# Patient Record
Sex: Male | Born: 1940
Health system: Southern US, Community
[De-identification: ages and names within clinical notes are randomized; demographics above are authoritative.]

## PROBLEM LIST (undated history)

## (undated) ENCOUNTER — Emergency Department (HOSPITAL_COMMUNITY): Payer: Medicare Other

## (undated) DIAGNOSIS — F329 Major depressive disorder, single episode, unspecified: Secondary | ICD-10-CM

## (undated) DIAGNOSIS — F32A Depression, unspecified: Secondary | ICD-10-CM

## (undated) DIAGNOSIS — F40240 Claustrophobia: Secondary | ICD-10-CM

## (undated) DIAGNOSIS — Z950 Presence of cardiac pacemaker: Secondary | ICD-10-CM

## (undated) DIAGNOSIS — F419 Anxiety disorder, unspecified: Secondary | ICD-10-CM

## (undated) DIAGNOSIS — I951 Orthostatic hypotension: Secondary | ICD-10-CM

## (undated) DIAGNOSIS — I714 Abdominal aortic aneurysm, without rupture, unspecified: Secondary | ICD-10-CM

## (undated) DIAGNOSIS — M545 Low back pain, unspecified: Secondary | ICD-10-CM

## (undated) DIAGNOSIS — Z92241 Personal history of systemic steroid therapy: Secondary | ICD-10-CM

## (undated) DIAGNOSIS — E7800 Pure hypercholesterolemia, unspecified: Secondary | ICD-10-CM

## (undated) DIAGNOSIS — R011 Cardiac murmur, unspecified: Secondary | ICD-10-CM

## (undated) DIAGNOSIS — F0390 Unspecified dementia without behavioral disturbance: Secondary | ICD-10-CM

## (undated) DIAGNOSIS — K5900 Constipation, unspecified: Secondary | ICD-10-CM

## (undated) DIAGNOSIS — M1990 Unspecified osteoarthritis, unspecified site: Secondary | ICD-10-CM

## (undated) DIAGNOSIS — D699 Hemorrhagic condition, unspecified: Secondary | ICD-10-CM

## (undated) DIAGNOSIS — Z9289 Personal history of other medical treatment: Secondary | ICD-10-CM

## (undated) DIAGNOSIS — G8929 Other chronic pain: Secondary | ICD-10-CM

## (undated) DIAGNOSIS — I471 Supraventricular tachycardia: Secondary | ICD-10-CM

## (undated) DIAGNOSIS — K5090 Crohn's disease, unspecified, without complications: Secondary | ICD-10-CM

## (undated) DIAGNOSIS — F4310 Post-traumatic stress disorder, unspecified: Secondary | ICD-10-CM

## (undated) DIAGNOSIS — N400 Enlarged prostate without lower urinary tract symptoms: Secondary | ICD-10-CM

## (undated) DIAGNOSIS — I480 Paroxysmal atrial fibrillation: Secondary | ICD-10-CM

## (undated) DIAGNOSIS — I739 Peripheral vascular disease, unspecified: Secondary | ICD-10-CM

## (undated) DIAGNOSIS — R001 Bradycardia, unspecified: Secondary | ICD-10-CM

## (undated) DIAGNOSIS — I4719 Other supraventricular tachycardia: Secondary | ICD-10-CM

## (undated) DIAGNOSIS — N3281 Overactive bladder: Secondary | ICD-10-CM

## (undated) DIAGNOSIS — I10 Essential (primary) hypertension: Secondary | ICD-10-CM

## (undated) DIAGNOSIS — I2510 Atherosclerotic heart disease of native coronary artery without angina pectoris: Secondary | ICD-10-CM

## (undated) DIAGNOSIS — K219 Gastro-esophageal reflux disease without esophagitis: Secondary | ICD-10-CM

## (undated) DIAGNOSIS — N189 Chronic kidney disease, unspecified: Secondary | ICD-10-CM

## (undated) DIAGNOSIS — G4730 Sleep apnea, unspecified: Secondary | ICD-10-CM

## (undated) DIAGNOSIS — G629 Polyneuropathy, unspecified: Secondary | ICD-10-CM

## (undated) HISTORY — DX: Other supraventricular tachycardia: I47.19

## (undated) HISTORY — DX: Paroxysmal atrial fibrillation: I48.0

## (undated) HISTORY — DX: Abdominal aortic aneurysm, without rupture, unspecified: I71.40

## (undated) HISTORY — DX: Bradycardia, unspecified: R00.1

## (undated) HISTORY — DX: Orthostatic hypotension: I95.1

## (undated) HISTORY — PX: MIDDLE EAR SURGERY: SHX713

## (undated) HISTORY — DX: Atherosclerotic heart disease of native coronary artery without angina pectoris: I25.10

## (undated) HISTORY — DX: Abdominal aortic aneurysm, without rupture: I71.4

## (undated) HISTORY — DX: Supraventricular tachycardia: I47.1

## (undated) HISTORY — PX: INSERT / REPLACE / REMOVE PACEMAKER: SUR710

## (undated) HISTORY — DX: Peripheral vascular disease, unspecified: I73.9

## (undated) HISTORY — PX: SHOULDER ARTHROSCOPY: SHX128

## (undated) HISTORY — PX: TONSILLECTOMY: SUR1361

## (undated) HISTORY — PX: KNEE ARTHROSCOPY: SHX127

---

## 1965-02-04 DIAGNOSIS — Z9289 Personal history of other medical treatment: Secondary | ICD-10-CM

## 1965-02-04 HISTORY — PX: HIP SURGERY: SHX245

## 1965-02-04 HISTORY — DX: Personal history of other medical treatment: Z92.89

## 1997-02-04 HISTORY — PX: RENAL ARTERY STENT: SHX2321

## 1997-02-09 HISTORY — PX: CORONARY ANGIOPLASTY WITH STENT PLACEMENT: SHX49

## 1997-09-20 ENCOUNTER — Observation Stay (HOSPITAL_COMMUNITY): Admission: RE | Admit: 1997-09-20 | Discharge: 1997-09-21 | Payer: Self-pay | Admitting: Cardiovascular Disease

## 1999-04-23 ENCOUNTER — Encounter: Admission: RE | Admit: 1999-04-23 | Discharge: 1999-04-23 | Payer: Self-pay | Admitting: Gastroenterology

## 1999-04-23 ENCOUNTER — Encounter: Payer: Self-pay | Admitting: Gastroenterology

## 2000-08-01 ENCOUNTER — Encounter: Payer: Self-pay | Admitting: Cardiovascular Disease

## 2000-08-01 ENCOUNTER — Ambulatory Visit (HOSPITAL_COMMUNITY): Admission: RE | Admit: 2000-08-01 | Discharge: 2000-08-01 | Payer: Self-pay | Admitting: Cardiovascular Disease

## 2001-02-20 ENCOUNTER — Encounter: Payer: Self-pay | Admitting: Gastroenterology

## 2001-02-20 ENCOUNTER — Encounter: Admission: RE | Admit: 2001-02-20 | Discharge: 2001-02-20 | Payer: Self-pay | Admitting: Gastroenterology

## 2001-04-29 ENCOUNTER — Encounter (INDEPENDENT_AMBULATORY_CARE_PROVIDER_SITE_OTHER): Payer: Self-pay | Admitting: Specialist

## 2001-04-29 ENCOUNTER — Ambulatory Visit (HOSPITAL_COMMUNITY): Admission: RE | Admit: 2001-04-29 | Discharge: 2001-04-29 | Payer: Self-pay | Admitting: Gastroenterology

## 2001-11-04 ENCOUNTER — Encounter: Payer: Self-pay | Admitting: Orthopaedic Surgery

## 2001-11-04 ENCOUNTER — Encounter: Admission: RE | Admit: 2001-11-04 | Discharge: 2001-11-04 | Payer: Self-pay | Admitting: Orthopaedic Surgery

## 2003-06-14 ENCOUNTER — Ambulatory Visit (HOSPITAL_COMMUNITY): Admission: RE | Admit: 2003-06-14 | Discharge: 2003-06-14 | Payer: Self-pay | Admitting: Orthopaedic Surgery

## 2003-08-29 ENCOUNTER — Inpatient Hospital Stay (HOSPITAL_COMMUNITY): Admission: RE | Admit: 2003-08-29 | Discharge: 2003-08-30 | Payer: Self-pay | Admitting: Neurosurgery

## 2004-01-10 ENCOUNTER — Encounter: Admission: RE | Admit: 2004-01-10 | Discharge: 2004-01-10 | Payer: Self-pay | Admitting: Gastroenterology

## 2005-01-31 ENCOUNTER — Encounter: Admission: RE | Admit: 2005-01-31 | Discharge: 2005-01-31 | Payer: Self-pay | Admitting: Gastroenterology

## 2005-10-10 ENCOUNTER — Ambulatory Visit (HOSPITAL_COMMUNITY): Admission: RE | Admit: 2005-10-10 | Discharge: 2005-10-11 | Payer: Self-pay | Admitting: Cardiovascular Disease

## 2006-05-18 ENCOUNTER — Inpatient Hospital Stay (HOSPITAL_COMMUNITY): Admission: EM | Admit: 2006-05-18 | Discharge: 2006-05-20 | Payer: Self-pay | Admitting: Emergency Medicine

## 2007-02-12 ENCOUNTER — Ambulatory Visit (HOSPITAL_COMMUNITY): Admission: RE | Admit: 2007-02-12 | Discharge: 2007-02-12 | Payer: Self-pay | Admitting: Cardiovascular Disease

## 2007-11-20 ENCOUNTER — Ambulatory Visit (HOSPITAL_COMMUNITY): Admission: RE | Admit: 2007-11-20 | Discharge: 2007-11-20 | Payer: Self-pay | Admitting: Orthopedic Surgery

## 2008-08-19 ENCOUNTER — Encounter: Admission: RE | Admit: 2008-08-19 | Discharge: 2008-08-19 | Payer: Self-pay | Admitting: Neurosurgery

## 2010-05-03 ENCOUNTER — Other Ambulatory Visit: Payer: Self-pay | Admitting: Gastroenterology

## 2010-06-19 NOTE — Op Note (Signed)
NAME:  Kevin Wiley, Kevin Wiley NO.:  [id number]   MEDICAL RECORD NO.:  [id number]          PATIENT TYPE:  AMB   LOCATION:  DAY                          FACILITY:  [facility]   PHYSICIAN:  Kipp Brood. Gioffre, M.D.DATE OF BIRTH:  07-Oct-1940   DATE OF PROCEDURE:  11/20/2007  DATE OF DISCHARGE:                               OPERATIVE REPORT   SURGEON:  Kipp Brood. Gladstone Lighter, M.D.   ASSISTANT:  Nurse.   PREOPERATIVE DIAGNOSIS:  Rule out the possibility of a torn medial and  lateral meniscus, left knee.   Note, the patient and I discussed the possibilities of a MRI  preoperatively.  He preferred not to do that on the basis that it is the  same problem he had in his other knee when we arthroscoped it before.   POSTOPERATIVE DIAGNOSIS:  1. 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  2. He had a chronic synovitis suprapatellar pouch.   OPERATION:  1. Diagnostic arthroscopy, right knee.  2. Synovectomy suprapatellar pouch, right knee.  3. Abrasion chondroplasty medial femoral condyle, right knee.   PROCEDURE:  Under general anesthesia, routine orthopedic prepping and  draping of the right lower extremity was carried out.  He had 1 gram of  IV Ancef.  Small punctate incision was made in the suprapatellar pouch.  Inflow cannula was inserted.  Knee was distended with saline.  Another  small punctate incision was made in the anterolateral joint.  The  arthroscope was entered from the lateral approach.  I probed the medial  and lateral menisci.  There were definitely intact.  There was  absolutely no tears.  Cruciates were intact.  Suprapatellar pouch  introduced the ArthroCare.  The ArthroCare was entered from the medial  approach, and I did an abrasion chondroplasty because of a large flap of  articular cartilage and medial femoral condyle that was literally  hanging off the condyle  like an orange peel.  Once that was completed,  we further investigated the area, and there was no other abnormalities.  Lateral joint was reinvestigated again.  I probed the lateral meniscus  as well as the medial, and there was no loose or torn pieces of  meniscus.  I went up in the suprapatellar pouch.  He had a chronic  synovitis.  I introduced the ArthroCare and did a synovectomy.  Thoroughly irrigated out the knee, removed all the fluid, injected 30 mL  0.25% Marcaine with epinephrine in the joint.  All three punctate  incisions were closed with 3-0 nylon suture, and a sterile Neosporin  bundle dressing was applied.  Following that, the patient left the  operative room satisfactory position.   Postoperatively, he will be on:  1. Percocet 10/650 one every 4 hours p.r.n. for pain.  2. He will be on aspirin 325 mg b.i.d. as an anticoagulant today and      for 2 weeks.  3. He will be on crutches partial  to full weightbearing as tolerated.  4. I will see him in the office in 12-14 days for follow-up.           ______________________________  Kipp Brood. Gladstone Lighter, M.D.     RAG/MEDQ  D:  11/20/2007  T:  11/20/2007  Job:  [id number]   cc:   Jori Moll A. Gladstone Lighter, M.D.  Fax: [phone]

## 2010-06-22 NOTE — Op Note (Signed)
NAME:  LAVEL, RIEMAN                           ACCOUNT NO.:  [id number]   MEDICAL RECORD NO.:  [id number]                   PATIENT TYPE:  INP   LOCATION:  2899                                 FACILITY:  [facility]   PHYSICIAN:  Hosie Spangle, M.D.            DATE OF BIRTH:  1940-11-24   DATE OF PROCEDURE:  08/29/2003  DATE OF DISCHARGE:                                 OPERATIVE REPORT   PREOPERATIVE DIAGNOSES:  Cervical spondylosis with cervical degenerative  disk disease, cervical instability, and radiculopathy.   POSTOPERATIVE DIAGNOSES:  Cervical spondylosis with cervical degenerative  disk disease, cervical instability, and radiculopathy.   PROCEDURE:  C4-5, C5-6, and C6-7 anterior cervical diskectomy and  arthrodesis with iliac crest allograft and tethered cervical plating.   SURGEON:  Hosie Spangle, M.D.   ASSISTANT:  Ashok Pall, M.D.   ANESTHESIA:  General endotracheal.   INDICATIONS:  The patient is a 70 year old man who presented with neck and  bilateral upper extremity pain who was found to have significant foraminal  encroachment from spurring bilaterally at C5-6 and C6-7, and to have  hypermobility and instability to the C4-5 level.  A decision was therefore  made to proceed with a three-level anterior cervical diskectomy and  arthrodesis.   PROCEDURE:  The patient was brought to the operating room and placed under  general endotracheal anesthesia.  The patient was placed in 10 pounds of  Holter traction.  The neck was prepped with Betadine soap and solution, and  draped in a sterile fashion.  An oblique incision was made in the left side  of the neck paralleling the anterior border of the left sternocleidal  muscle.  Dissection was carried down to the subcutaneous tissue.  Bipolar  cautery and electrocautery was used to maintain hemostasis.  The skin had  been infiltrated with local anesthetic prior to skin incision.  Dissection  was then carried down  through an avascular plane into the  sternocleidomastoid, carotid and jugular veins out laterally, and the  trachea and esophagus medially.  The ventral aspect of the vertebral column  was identified and __________ taken and the C4-5, C5-6, and C6-7  intervertebral disk space was identified.  Diskectomy was begun at each  level with incision of the annulus and denuded with microcurettes and  pituitary rongeurs.  Anterior osteophytic overgrowth at each level was  removed using an osteophyte removal tool as well as a Land.  The  microscope was draped and brought into the field to provide additional  magnification, illumination, and visualization, and the remainder of the  decompression was performed using microdissection and microsurgical  technique.  The cartilaginous endplates of the corresponding vertebrae were  removed using the microcurettes along with the __________ Max drill.  Then,  posterior osteophytic overgrowth was removed using the __________ Max drill  along with a 2 mm Kerrison punch with a thin footplate.  Particular  attention was paid to the neural foramina bilaterally at C5-6 and C6-7 to  insure good decompression.  In the end, good decompression of the spinal  canal and thecal sac as well as the neural foramina and nerve roots was  achieved bilaterally at each level.  Once a decompression was completed,  hemostasis was established with the use of  Gelfoam soaked in thrombin.  Then, using bone sizers, we selected grafts for the intervertebral disk  spaces and placed an 8 mm graft at C4-5 and a pair of 7 mm grafts at C5-6  and C6-7.  Each of the grafts was hydrated in saline solution.  The grafts  were countersunk.  We then selected a three-level tethered cervical plate  that was positioned over the fusion construct and secured with a pair of 4 x  14 mm screws at C4 and C7 and single 4 x 15 mm screws at C5 and C6.  An x-  ray was taken that showed the grafts in  good position at C4-5 and C5-6 and  the screws in good position at C4 and C5.  The C6-7 level could not well  visualized due to the patient's large shoulders.  The wound was irrigated  with Bacitracin solution.  Check for hemostasis was established and  confirmed.  Then, we proceeded with closure.  The platysma was closed with  interrupted inverted 2-0 undyed Vicryl sutures, subcutaneous and  subcuticular layer was closed with interrupted inverted 3-0 undyed Vicryl  sutures, and the skin was approximated with Dermabond.  The patient  tolerated the procedure well.  The estimated blood loss was 100 cubic  centimeters.  Sponge and instrument counts were correct following surgery.  The patient was taken out of traction, placed in an Aspen collar, reversed  anesthetic, and extubated to be transferred to the recovery room for further  care.                                               Hosie Spangle, M.D.    RWN/MEDQ  D:  08/29/2003  T:  08/29/2003  Job:  [id number]

## 2010-06-22 NOTE — Cardiovascular Report (Signed)
NAME:  Kevin Wiley, Kevin Wiley NO.:  [id number]   MEDICAL RECORD NO.:  [id number]          PATIENT TYPE:  INP   LOCATION:  2023                         FACILITY:  [facility]   PHYSICIAN:  Quay Burow, M.D.   DATE OF BIRTH:  1940-08-31   DATE OF PROCEDURE:  DATE OF DISCHARGE:                            CARDIAC CATHETERIZATION   Kevin Wiley is a 70 year old married white male, patient of Dr. Rollene Fare,  with a history of LAD stenting in 1999.  He has had bilateral renal  artery PTN stenting in 1999 and 2001 and recent permanent transvenous  pacemaker insertion by Dr. Alla German last year.  He has had several  weeks of symptoms compatible with angina and was admitted last night  through the ER with unstable angina.  His EKG showed no acute changes  and his enzymes were negative.  He was placed on IV heparin and  nitroglycerin and presents now for diagnostic coronary arteriography to  rule out an ischemic etiology.   DESCRIPTION OF PROCEDURE:  The patient was brought to the 2nd floor  [facility] in the postabsorptive state.  He was premedicated  with p.o. Valium, IV Versed and fentanyl.   His right groin was prepped and shaved in the usual sterile fashion.  Xylocaine 1% was used for local anesthesia.  A 6-French sheath was  inserted in to the right femoral artery using standard Seldinger  technique.  Then 6-French right and left Judkins diagnostic catheters  along with a 6-French pigtail catheter were used for selective coronary  angiography, left ventriculography, distal abdominal aortography and  selective left renal artery angiography.  Visipaque dye was used for the  entirety of the case.  Retrograde aortic, left ventricular and pullback  pressures were recorded.   HEMODYNAMIC RESULTS:  1. Aortic systolic pressure 686, diastolic pressure 68.  2. Left ventricular systolic pressure 168, end-diastolic pressure 16.   SELECTIVE CORONARY ANGIOGRAPHY:  1. Left  main normal.  2. LAD:  This was a stent in the proximal LAD right after the first      diagonal branch which was widely patent.  There was a 40-50%      stenosis in the LAD just proximal to the diagonal branch unchanged      from the prior angiogram performed in 1999.  3. Left circumflex:  Free of significant disease.  4. Ramus branch:  Free of disease.  5. Right coronary artery:  A large dominant vessel with 30% proximal      stenosis and no damping.   LEFT VENTRICULOGRAPHY:  RAO left ventriculogram was performed using 25  mL of Visipaque dye at 12 mL per second.  The overall LVEF was estimated  at greater than 60% without focal wall motion abnormalities.   DISTAL ABDOMINAL AORTOGRAPHY:  Performed using 30 mL of Visipaque dye at  20 mL per second.  The renal arteries appeared widely patent, though  there did appear to be moderate in-stent restenosis within the left  renal artery stent.  The infrarenal abdominal aorta and iliac  bifurcation appeared free of significant atherosclerotic  changes.   Selective left renal artery angiography was performed with a right  Judkins catheter revealing at most 50% in-stent restenosis with  approximately a 15-mm gradient.   IMPRESSION:  Kevin Wiley has noncritical coronary artery disease with a  widely patent left anterior descending artery stent.  There are no  obstructions which would cause myocardial ischemia.  Continued medical  therapy will be recommended.   An ACT was measured less than 200.  Sheath was removed and pressure was  held to the groin to achieve hemostasis.  The patient left the lab in  stable condition.  He will remain recumbent for 6 hours, hydrated  overnight and discharged home in the morning.  Left the lab in stable  condition.      Quay Burow, M.D.  Electronically Signed     JB/MEDQ  D:  05/19/2006  T:  05/20/2006  Job:  [id number]   cc:   Patient's chart  [facility]. Rollene Fare, M.D.  Burnard Bunting, M.D.

## 2010-06-22 NOTE — Op Note (Signed)
NAME:  Kevin Wiley, Kevin Wiley NO.:  [id number]   MEDICAL RECORD NO.:  [id number]          PATIENT TYPE:  OIB   LOCATION:  3706                         FACILITY:  [facility]   PHYSICIAN:  Octavia Heir, MD  DATE OF BIRTH:  Sep 30, 1940   DATE OF PROCEDURE:  10/10/2005  DATE OF DISCHARGE:                                 OPERATIVE REPORT   PROCEDURES:  Insertion of a Medtronic EnRhythm P1501DR. Generator, serial  number [id number] with passive atrial and ventricular leads.   ATTENDING SURGEON:  Octavia Heir, MD   COMPLICATIONS:  None.   INDICATIONS:  Kevin Wiley is a 70 year old male patient of Dr. Kennyth Lose  and Dr. Rockey Situ, with a history of paroxysmal atrial fibrillation and  sick sinus syndrome with documented bradycardia and syncope.  He does have  known CAD status post LAD stenting in 1999 with normal LV function with mild  disease of the diagonal system.  His last Cardiolite was in April of 2005.  He is now referred for a dual-chamber pacer implant secondary to sick sinus  syndrome.   DESCRIPTION OF OPERATION:  After obtaining informed consent, patient was  brought to the cardiac cath lab.  The right chest was shaved, prepped and  draped in the usual sterile fashion.  ECG monitor established.  Then 1%  lidocaine was used to anesthetize the right mid subclavicular area.  Next,  an approximately 3-cm mid infraclavicular horizontal incision was then  carried out and hemostasis was obtained with electrocautery.  Blunt  dissection was then used to carry this down to the right pectoral fascia.  Next an approximately 3 x 4 cm pocket was created over the right pectoral  fascia and again hemostasis was obtained with electrocautery.  The right  subclavian vein was then entered using fluoro and contrast guidance, and a  guide wire was then easily passed into the SVC and right atrium.  Following  the guide wire insertion, a 4-0 silk suture was placed at the apex of  the  guide wire to be used as lead anchors.  Next, #9 French dilating sheath was  inserted over the retained guide wire and the dilator and the guide were  removed.  Over this, a 50-cm passive Medtronic lead, model number [id number],  serial number [id number] was then passed into the right atrium.  Guide wire  was retained.  The sheath was removed.  A 7 French dilator sheath was then  placed over the retained guide wire and the guide wire and dilator were  removed.  A second passive 53-cm Medtronic lead, model number 1025, serial  number [id number] was then inserted into the right atrium and peel-away  sheath was removed.  A  J curve was then placed in the ventricular lead  stylet. The ventricular lead was then allowed to follow up to the tricuspid  valve and positioned in the RV apex without difficulty.   Thresholds were then determined.  R waves were measured at 17.1 millivolts.  Impedance 672 ohms.  Threshold in the ventricle was 0.2 volts at 0.5  milliseconds.  Current was 0.4 milliamp.  Ten-volts was negative for  diaphragmatic stimulation.  The atrial lead was then positioned in the right  atrial appendage and again thresholds were determined.  P waves were  measured at 4.5 millivolts.  Impedance was 541 ohms.  Threshold in the  atrial lead was 1 volt at 0.5 milliseconds.  Current was 2.3 milliamps.  Ten-  volts again negative for diaphragmatic stimulation.   The leads were then sutured in place with two silk sutures per lead which  were secured to the pectoral fascia.  The pocket was then copiously  irrigated with 1% clindamycin solution, and again hemostasis was confirmed.  The leads were then connected in serial fashion to the Medtronic EnRhythm  P1501DR generator, serial number [id number].  Head screws were tied and  pacing was confirmed.  A single silk suture was then placed in the apex of  the pocket.  The generator leads were then delivered into the pocket and the  header was  secured to the silk suture.  The subcutaneous layer was then  closed with running 2-0 Vicryl.  The skin layer was then closed with running  4-0 Vicryl.  Steri-Strips were applied.  The patient was returned the  recovery room in stable condition.   CONCLUSION:  Successful implant of a Medtronic EnRhythm [id number] generator,  serial number [id number] with passive atrial and ventricular Medtronic  leads.      Octavia Heir, MD  Electronically Signed     RHM/MEDQ  D:  10/10/2005  T:  10/10/2005  Job:  [id number]   cc:   Dr. Kennyth Lose  Kevin Wiley, M.D.

## 2010-06-22 NOTE — Cardiovascular Report (Signed)
[place]  Patient:    Kevin Wiley, Kevin Wiley                        MRN: [id number] Proc. Date: 08/01/00 Adm. Date:  [id number] Disc. Date: [id number] Attending:  Epifanio Lesches CC:         Richard A. Reynaldo Minium, M.D.  CP Lab  Jeryl Columbia, M.D.   Cardiac Catheterization  PROCEDURE:  Retrograde central aortic catheterization, selective coronary angiography via Judkins technique, LV angiogram RAO and LAO projection, subselective LIMA, abdominal aortic angiogram, midstream PA projection.  DESCRIPTION OF PROCEDURE:  The patient was brought to the second floor CP lab in a postabsorptive state after 5 mg of Valium p.o. premedication.  He was admitted as a same day admission, given 2 mg of Nubain for sedation in the laboratory.  Recent LDL was 147, creatinine 1.4, normal TSH, HDL cholesterol 47, and total cholesterol 225.  The patient had not been seen by me in several years and had been under the care of Dr. Reynaldo Minium.  He has had PTCA and stenting of a high grade 85% proximal - mid LAD lesion on February 09, 1997, with a 3.0/8 ACS Multi-Link stent.  He had subsequent recatheterization x 2 with no restenosis on February 16, 1997 and April 19, 1997 for atypical symptoms.  He has had bilateral renal artery PTA and stenting for high grade renal artery stenosis associated with systemic hypertension.  He had right renal artery PTA and stent placed July 20, 1997, and he had left renal artery PTA and stent on September 20, 1997.  The patient has a history of prior spinal stenosis, hyperlipidemia, hypertension, Crohns disease under control.  He was admitted to the hospital on this occasion after presenting to the office with complaints of chest discomfort and arm discomfort similar to his prior symptoms.  He had burning in the right chest and left arm discomfort which he felt was the same as his prior symptoms.  Blood pressure was 160/90.  Creatinine was as  outlined above.  He had been on Lipitor 10, Prilosec 20, Norvasc 10, Serzone 100, Colestid one q.d., Ambien 10, and one baby aspirin a day, atenolol 25 q.d.  It was felt best to proceed with diagnostic angiography in this setting with the patients atypical symptoms and abnormal resting EKG showing T-wave inversion in V4 through V6 in a fairly symmetrical distribution.  The patient was brought to the second CP lab.  The right groin was prepped and draped in the usual manner.  One percent Xylocaine was used for local anesthesia and the RFA was entered with a single anterior puncture using an 18 thin wall needle.  Diagnostic angiography was performed with a 6-French 4 cm tapered preformed coronary and pigtail catheters using Omnipaque dye throughout the procedure.  Subselective LIMA was done with the right coronary catheter followed by LV angiogram in the RAO and LAO projection.  Abdominal aortic angiogram was done in the midstream PA projection at 30 cc and 20 cc per second.  The patient tolerated the diagnostic procedure well. The catheter was removed.  The side-arm sheath was flushed.  The plans were for a 6-French Perclose device in the RFA if possible.  PRESSURES:  LV 149/0, LVEDP 16-18 mmHg.  CA 149/82 mmHg.  No gradient across the aortic valve on catheter pull back.  Fluoroscopy showed the LAD stent beyond the first small diagonal and the large second diagonal.  Main left coronary - there was mild proximal left coronary calcification of +1.  No intracardiac or valvular calcification seen and mild aortic root calcification beyond the left subclavian.  LV angiogram in the RAO and LAO projection showed a vigorous normally contracting ventricle with angiographic LVH.  EF approximately 60%.  No mitral regurgitation.  The LIMA was widely patent.  There were _______ of the left subclavian.  No significant stenosis.  Abdominal aortic angiogram revealed fluoroscopically and on  angiography left and right renal stents in excellent position covering the ostia bilaterally. The left renal stent had essentially less than 10% residual narrowing with excellent flow and no significant lumen intimal hyperplasia with large residual lumen.  The right renal stent had 30-40% intimal hyperplasia, but good residual lumen and good flow.  Infrarenal abdominal aorta showed no significant atherosclerotic disease, no aneurysm formation or stenosis, and there were mild irregularities.  The celiac and SFA were intact.  The aorta was only visualized down to the distal third and the iliac bifurcation was not well-visualized.  The right external iliac and hypogastric were intact on selective injection with puncture in the RCFA.  The main left coronary was normal.  The left anterior descending had irregularity and about 40% narrowing in the proximal third before the small first normal diagonal.  Following this was a large second diagonal that arose just before the previously placed 3.0/8 Multi-Link (February 09, 1997).  There was no encroachment on the lumen on the large DX-2 that bifurcated and had 40% smooth narrowing in its proximal third.  The stent beyond the DX-2 was widely patent and had about 20% restenosis with good flow.  The remainder of the LAD had no significant stenosis, coursed the apex of the heart, given off a small DX-3 from the midportion.  There was a large optional diagonal branch that bifurcated and was normal.  There was eccentric 40% narrowing at the proximal circumflex artery with excellent residual lumen.  The first marginal branch after the atrial branch was large, bifurcated, and normal, and the remainder of the circumflex which was nondominant, but moderate size was normal.  The right coronary was a very large dominant vessel.  There were  irregularities with 20-30% narrowing near the acute margin and the midportion. The remainder of the vessel was  widely patent with no significant stenosis, large, with a large normal PDA and PLA.  The patient has no restenosis of his LAD and no restenosis of his left renal stenosis.  He has mild restenosis of his right renal stent, but excellent residual lumen with only approximately 40% narrowing.  He has also had some regression of disease in the proximal LAD and second diagonal.  The etiology of his atypical chest pain and arm pain is not clear. I think it is noncoronary.  Recommend continued medical therapy.  Will also need continued therapy of his hypertension.  I would say he is a candidate for increasing his statins in view of his recent lipid profile and adding an ACE to his regimen which may help for his coronary disease as well as his hypertension.  Continued surveillance of his renal artery stenosis.  CATHETERIZATION DIAGNOSES: 1. Chest and arm pain, atypical etiology not determined. 2. No restenosis of proximal left anterior descending stent (February 09, 1997,    3.0/8 Multi-Link ACS duet). 3. No restenosis of left renal artery stenosis, mild restenosis of right renal    artery stent as outlined (left renal stent September 20, 1997; right renal  stent April 19, 1997). 4. Crohns disease, under control on medical therapy. 5. Hyperlipidemia. 6. Past lumbosacral disk disease. 7. Remote smoker (quit in 1975), occasional cigar now. DD:  08/01/00 TD:  08/02/00 Job: [id number]

## 2010-06-22 NOTE — Procedures (Signed)
[place]  Patient:    Kevin Wiley, Kevin Wiley Visit Number: [id number] MRN: [id number]          Service Type: END Location: ENDO Attending Physician:  Orvis Brill Dictated by:   Jeryl Columbia, M.D. Proc. Date: 04/29/01 Admit Date:  04/29/2001   CC:         [name], M.D.  Richard A. Reynaldo Minium, M.D.   Procedure Report  PROCEDURE PERFORMED:  Colonoscopy with biopsy.  ENDOSCOPIST:  Jeryl Columbia, M.D.  INDICATIONS FOR PROCEDURE:  Patient with periodic GI symptoms.  History of very mild Crohns disease due for colonic screening.  Consent was signed after risks, benefits, methods, and options were thoroughly discussed in the office.  MEDICATIONS USED:  Demerol 100 mg, Versed 10 mg.  DESCRIPTION OF PROCEDURE:  Rectal inspection was pertinent for external hemorrhoids.  Digital exam was negative.  The video colonoscope was inserted, easily advanced around the colon to the cecum.  This did require rolling him on his back and some abdominal pressure.  Upon insertion there was some very minimal distal proctitis, some left-sided diverticula and some very rare minimal patches of inflammation.  The cecum was identified by the appendiceal orifice and the ileocecal valve.  The scope was then inserted a  short ways in the terminal ileum which was normal.  Photodocumentation and a few biopsies were obtained.  We fell out of the terminal ileum unfortunately, and were unable to easily readvance.  We elected to withdraw.  Prep was adequate. There was some liquid stool that required washing and suctioning but on slow withdrawal through the colon, there was a very rare patch of inflammation that was biopsied.  Random colon biopsies were obtained as well and put in the second container.  There were also a few tiny hyperplastic appearing polyps, some with some inflammation that were biopsied as well and all put in the same container.  Scope was  withdrawn around the left side of the colon where occasional left-sided diverticula were seen but no other abnormalities other than some minimal inflammation as above.  Once back in the rectum, the scope was retroflexed confirming the minimal distal proctitis and some internal hemorrhoids.  Some biopsies of the proctitis were obtained and put in a third container.  The scope was straightened and readvanced a short ways up the sigmoid, air was suctioned, scope removed.  The patient tolerated the procedure well.  There was no obvious immediate complication.  ENDOSCOPIC DIAGNOSIS: 1. Internal and external hemorrhoids. 2. Very minimal distal proctitis status post biopsy. 3. Occasional left-sided diverticula. 4. Very minimal tiny patches of occasional inflammation and an occasional    tiny hyperplastic appearing polyp, all biopsied. 5. Otherwise within to the cecum and the terminal ileum with random biopsies    throughout.  PLAN:  Await pathology.  Follow-up  p.r.n. or in two to three months.  If signs of proctitis increase, would use suppositories or foams. Dictated by:   Jeryl Columbia, M.D. Attending Physician:  Orvis Brill DD:  04/29/01 TD:  04/30/01 Job: [id number]

## 2010-06-22 NOTE — Discharge Summary (Signed)
NAME:  Kevin Wiley, Kevin Wiley NO.:  [id number]   MEDICAL RECORD NO.:  [id number]          PATIENT TYPE:  INP   LOCATION:  2023                         FACILITY:  [facility]   PHYSICIAN:  Otilio Carpen. Ingold, N.P.  DATE OF BIRTH:  [birth year]   DATE OF ADMISSION:  05/18/2006  DATE OF DISCHARGE:  05/20/2006                               DISCHARGE SUMMARY   DISCHARGE DIAGNOSES:  1. Chest pain.  Negative myocardial infarction.      a.     Patent stent to the proximal left anterior descending with       40% to 50% stenosis approximal to the stent.  30% right carotid       artery stenosis proximally.  Normal left ventricular function.      b.     Negative pulmonary embolism on computed tomography scan.      c.     X-rays do not show acute injury though he did have mild       foramen narrowing of C6-7 bilaterally.  2. Peripheral vascular disease with patent renal stents.  3. Hypertension, controlled.  4. Hyperlipidemia.  5. History of bilateral fundus fluorescein angiography and      percutaneous transluminal angioplasty in 1999.  6. Permanent transvenous pacemaker with a Medtronic EnRhythm T1501DR      plus generator for syncope and sick sinus syndrome with paroxysmal      atrial fibulation indication and bradycardia.  This was placed on      October 10, 2005.   DISCHARGE MEDICATIONS:  1. Effexor 75 mg daily.  2. Neurontin 400 mg 3 times a day.  3. Omeprazole 20 mg 2 daily.  4. Azulfidine 500 mg daily.  5. Atenolol 75 mg daily.  6. Valium 5 mg daily.  7. Norvasc 5 mg daily.  8. Ambien 10 mg every evening for sleep.  9. Zocor 40 mg daily.  10.Benazepril 40 mg daily.  11.Aspirin 81 mg daily.  12.Zetia 10 mg daily.  13.Trazodone 200 mg every evening.  14.Loratadine 10 mg daily as before.  15.Fish oil 6000 mg daily.  16.Nitroglycerin sublingual as needed for chest pain.  17.Cyclobenzaprine 10 mg every evening.  18.Cholestagel as before if needed.   DISCHARGE INSTRUCTIONS:  1. Decrease activity slowly.  2. No lifting for 2 days.  3. No driving for 2 days.  4. Low-sodium, heart-healthy diet.  5. Wash the cath site with soap and water.  Call if any bleeding,      swelling, or drainage.  6. Follow up with Dr. Rollene Fare on April 23rd at 9:30 a.m.   HISTORY OF PRESENT ILLNESS:  This is a 70 year old male with 1 child  presented to the ER on May 18, 2006, with unstable angina.  He has a  history of hypertension and hyperlipidemia.  He was admitted to [facility] with IV heparin and IV nitroglycerin.  Enzymes were negative.   FAMILY HISTORY:  See H&P.   SOCIAL HISTORY:  See H&P.   REVIEW OF SYSTEMS:  See H&P.   OUTPATIENT MEDS:  See H&P.   ALLERGIES:  None.   PHYSICAL EXAMINATION AT DISCHARGE:  VITAL SIGNS:  Blood pressure 110/75.  Pulse 62.  LUNG:  Clear.  HEART:  Regular rate and rhythm.  Cath site was stable.   LABORATORY DATA:  Hemoglobin 14.9.  Hematocrit 42.9.  WBC 9.5.  Platelets 201.  These were stable through hospitalization.  Pro time of  14.  INR 1.  On heparin he was therapeutic.   Chemistries:  Sodium 146.  Potassium 4.4 ,with chloride 106.  CO2 31.  Glucose 92.  BUN 6.  Creatinine 1.09.  Calcium 8.9.   Cardiac markers:  CK 57, 15, 46.  MB 0.8, 0.7, 0.7.  Troponin-I 0.01 to  less than 0.01.  All negative for MI.   Chest x-ray:  Cardiomegaly with pacemaker.  No acute abnormalities.   CT of the chest found no acute pulmonary thromboembolism.  Tiny focus of  atelectasis versus pneumonitis in the left lower lobe.  Postoperative  changes are present in the cervical spine.   We also did a chest x-ray of his cervical neck, cervical interior fusion  with C4-C7 and mild foramen narrowing C6-7.   EKG:  On admission sinus brady.  Heart rate 59.  Right bundle branch  block with ST depression in II, III and aVF, and also in lead IV through  V1.   FOLLOWUP:  Remain the same.  Patient was admitted by Dr. Gwenlyn Found on May 18, 2006.  Was  given IV nitro, IV heparin.  The IV nitrate bottomed his  pressure to 70/30.  His nitrate had to be discontinued by May 19, 2006.  He was stable without continued pain.  Some right shoulder pain  but no chest pain that morning.  He stated his symptom were very similar  to his preangioplasty and stent procedures in the past.  He underwent  cardiac catheterization with  results as stated and by April 15th he was stable.  He was still quite  concerned though.  He did not understand why the pain would be so  similar to his cardiac event so he did undergo CT of the chest to rule  out PE and on cervical disk films.  He will followup with Dr. Rollene Fare  sooner than his prearranged visit.      Otilio Carpen. Dorene Ar, N.P.     LRI/MEDQ  D:  05/20/2006  T:  05/20/2006  Job:  [id number]   cc:   Delfino Lovett A. Rollene Fare, M.D.  Burnard Bunting, M.D.

## 2010-06-22 NOTE — H&P (Signed)
NAME:  Kevin Wiley, Kevin Wiley NO.:  [id number]   MEDICAL RECORD NO.:  [id number]          PATIENT TYPE:  EMS   LOCATION:  MAJO                         FACILITY:  [facility]   PHYSICIAN:  Quay Burow, M.D.   DATE OF BIRTH:  [birth year]   DATE OF ADMISSION:  05/18/2006  DATE OF DISCHARGE:                              HISTORY & PHYSICAL   HISTORY OF PRESENT ILLNESS:  Mr. Kevin Wiley is a 70 year old married white  male father of one child who presents tonight with symptoms compatible  with instable angina.  His cardiologist was Dr.__________  and primary  care physician is Dr. Tedra Senegal.   PAST MEDICAL HISTORY:  Remarkable for hypertension, hyperlipidemia.   FAMILY HISTORY:  His father had coronary disease.   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   He does have a bare metal stent in his LAD, and has had bilateral renal  arty PTM stenting for hypertension as well as a permanent transfuse  pacemaker placed last September by Dr. Alla German for bradycardia,  sick sinus syndrome, and presyncope.  He suffers from posttraumatic  stress syndrome from being in the [employer] and has had cervical  laminectomy via the anterior approach by Hosie Spangle, M.D. in the  past.   Patient reiterates a few-week history of chest and arm pain consistent  with unstable angina.  He had pain this evening which brought him to the  [facility] emergency room where he was treated with IV heparin and  nitroglycerin.  The pain essentially subsided with some residual  localized pain over his anterior precordium.   MEDICATIONS:  __________  35 mg a day, gabapentin 400 mg p.o. t.i.d.,  Zetia 10 mg a day, Prevacid 20, sulfasalazine 500 mg p.o. daily,  Atenolol 75, benazepril 40 mg a day, diazepam 5, aspirin 81, Ambien 10  q.h.s., and amlodipine 10 mg p.o. daily  trazodone 200 p.o. q.h.s., and  simvastatin 40 mg a day.   ALLERGIES:  None.   REVIEW OF  SYSTEMS:  See above.   PHYSICAL EXAMINATION:  VITALS:  Blood pressure was 116/84, pulse 63.  LUNGS:  Clear. __________.  HEART:  Regular rate and rhythm without murmurs, gallops, or rubs, or  clicks.  ABDOMEN:  Soft, nontender with active bowel sounds.  There is no hepatosplenomegaly, organomegaly or audible bruits.  Femoral  arteries are 2+ with intact pedal pulses.  12-lead  EKG revealed sinus bradycardia at 59 with inferior and lateral  Q-wave inversion, R transition unchanged from his prior EKG performed  September of 2007.   LABORATORY DATA:  Sodium 138, potassium 3.9, chloride 105, BUN 12,  glucose 110, hemoglobin 13, hematocrit 39, creatinine 1.2, point of care  markers were negative.  Chest x-ray showed no acute abnormalities,  cardiomegaly with pacemaker in place.   IMPRESSION:  Mr. Kevin Wiley has unstable angina with an EKG that is unchanged  negative point of care markers.  A majority of his pain has resolved  though he does have some localized pain over his left precordium.  He  is  no heparin and nitroglycerin.  Plans will be to admit him to the TCU,  cycle of enzymes.  He will need cardiac catheterization tomorrow.  I  have discussed this the patient and his wife, and they understand and  agree.      Quay Burow, M.D.  Electronically Signed     JB/MEDQ  D:  05/18/2006  T:  05/18/2006  Job:  [id number]

## 2010-11-06 LAB — COMPREHENSIVE METABOLIC PANEL
AST: 23
Albumin: 4.2
Alkaline Phosphatase: 45
BUN: 5 — ABNORMAL LOW
CO2: 30
Chloride: 105
GFR calc Af Amer: >60
GFR calc non Af Amer: >60
Potassium: 3.7
Total Bilirubin: 0.8

## 2010-11-06 LAB — CBC
HCT: 44.7
Platelets: 156
RBC: 4.63
WBC: 8.5

## 2011-02-07 DIAGNOSIS — N401 Enlarged prostate with lower urinary tract symptoms: Secondary | ICD-10-CM | POA: Diagnosis not present

## 2011-02-07 DIAGNOSIS — N318 Other neuromuscular dysfunction of bladder: Secondary | ICD-10-CM | POA: Diagnosis not present

## 2011-02-07 DIAGNOSIS — R3915 Urgency of urination: Secondary | ICD-10-CM | POA: Diagnosis not present

## 2011-03-28 DIAGNOSIS — Z125 Encounter for screening for malignant neoplasm of prostate: Secondary | ICD-10-CM | POA: Diagnosis not present

## 2011-03-28 DIAGNOSIS — E785 Hyperlipidemia, unspecified: Secondary | ICD-10-CM | POA: Diagnosis not present

## 2011-03-28 DIAGNOSIS — I10 Essential (primary) hypertension: Secondary | ICD-10-CM | POA: Diagnosis not present

## 2011-04-02 DIAGNOSIS — Z0000 Encounter for general adult medical examination without abnormal findings: Secondary | ICD-10-CM | POA: Diagnosis not present

## 2011-04-02 DIAGNOSIS — E785 Hyperlipidemia, unspecified: Secondary | ICD-10-CM | POA: Diagnosis not present

## 2011-04-02 DIAGNOSIS — I10 Essential (primary) hypertension: Secondary | ICD-10-CM | POA: Diagnosis not present

## 2011-04-02 DIAGNOSIS — I2510 Atherosclerotic heart disease of native coronary artery without angina pectoris: Secondary | ICD-10-CM | POA: Diagnosis not present

## 2011-04-04 DIAGNOSIS — E782 Mixed hyperlipidemia: Secondary | ICD-10-CM | POA: Diagnosis not present

## 2011-04-04 DIAGNOSIS — E119 Type 2 diabetes mellitus without complications: Secondary | ICD-10-CM | POA: Diagnosis not present

## 2011-04-04 DIAGNOSIS — I2510 Atherosclerotic heart disease of native coronary artery without angina pectoris: Secondary | ICD-10-CM | POA: Diagnosis not present

## 2011-04-05 DIAGNOSIS — R5383 Other fatigue: Secondary | ICD-10-CM | POA: Diagnosis not present

## 2011-04-05 DIAGNOSIS — E119 Type 2 diabetes mellitus without complications: Secondary | ICD-10-CM | POA: Diagnosis not present

## 2011-04-05 DIAGNOSIS — Z79899 Other long term (current) drug therapy: Secondary | ICD-10-CM | POA: Diagnosis not present

## 2011-04-05 DIAGNOSIS — E782 Mixed hyperlipidemia: Secondary | ICD-10-CM | POA: Diagnosis not present

## 2011-04-09 DIAGNOSIS — I2510 Atherosclerotic heart disease of native coronary artery without angina pectoris: Secondary | ICD-10-CM | POA: Diagnosis not present

## 2011-04-09 DIAGNOSIS — R079 Chest pain, unspecified: Secondary | ICD-10-CM | POA: Diagnosis not present

## 2011-04-09 DIAGNOSIS — R0989 Other specified symptoms and signs involving the circulatory and respiratory systems: Secondary | ICD-10-CM | POA: Diagnosis not present

## 2011-04-09 DIAGNOSIS — R0609 Other forms of dyspnea: Secondary | ICD-10-CM | POA: Diagnosis not present

## 2011-04-09 DIAGNOSIS — R0602 Shortness of breath: Secondary | ICD-10-CM | POA: Diagnosis not present

## 2011-05-14 DIAGNOSIS — I701 Atherosclerosis of renal artery: Secondary | ICD-10-CM | POA: Diagnosis not present

## 2011-05-14 DIAGNOSIS — I10 Essential (primary) hypertension: Secondary | ICD-10-CM | POA: Diagnosis not present

## 2011-05-21 DIAGNOSIS — R413 Other amnesia: Secondary | ICD-10-CM | POA: Diagnosis not present

## 2011-05-21 DIAGNOSIS — I739 Peripheral vascular disease, unspecified: Secondary | ICD-10-CM | POA: Diagnosis not present

## 2011-05-21 DIAGNOSIS — E782 Mixed hyperlipidemia: Secondary | ICD-10-CM | POA: Diagnosis not present

## 2011-05-21 DIAGNOSIS — I2510 Atherosclerotic heart disease of native coronary artery without angina pectoris: Secondary | ICD-10-CM | POA: Diagnosis not present

## 2011-05-24 DIAGNOSIS — I2510 Atherosclerotic heart disease of native coronary artery without angina pectoris: Secondary | ICD-10-CM | POA: Diagnosis not present

## 2011-05-24 DIAGNOSIS — J069 Acute upper respiratory infection, unspecified: Secondary | ICD-10-CM | POA: Diagnosis not present

## 2011-05-24 DIAGNOSIS — H66009 Acute suppurative otitis media without spontaneous rupture of ear drum, unspecified ear: Secondary | ICD-10-CM | POA: Diagnosis not present

## 2011-06-06 DIAGNOSIS — F411 Generalized anxiety disorder: Secondary | ICD-10-CM | POA: Diagnosis not present

## 2011-06-06 DIAGNOSIS — I2510 Atherosclerotic heart disease of native coronary artery without angina pectoris: Secondary | ICD-10-CM | POA: Diagnosis not present

## 2011-06-06 DIAGNOSIS — R413 Other amnesia: Secondary | ICD-10-CM | POA: Diagnosis not present

## 2011-06-06 DIAGNOSIS — F4310 Post-traumatic stress disorder, unspecified: Secondary | ICD-10-CM | POA: Diagnosis not present

## 2011-06-06 DIAGNOSIS — F341 Dysthymic disorder: Secondary | ICD-10-CM | POA: Diagnosis not present

## 2011-06-06 DIAGNOSIS — F29 Unspecified psychosis not due to a substance or known physiological condition: Secondary | ICD-10-CM | POA: Diagnosis not present

## 2011-06-10 ENCOUNTER — Other Ambulatory Visit: Payer: Self-pay | Admitting: Neurology

## 2011-06-10 DIAGNOSIS — R410 Disorientation, unspecified: Secondary | ICD-10-CM

## 2011-06-10 DIAGNOSIS — F4310 Post-traumatic stress disorder, unspecified: Secondary | ICD-10-CM

## 2011-06-10 DIAGNOSIS — I2510 Atherosclerotic heart disease of native coronary artery without angina pectoris: Secondary | ICD-10-CM

## 2011-06-10 DIAGNOSIS — F341 Dysthymic disorder: Secondary | ICD-10-CM

## 2011-06-11 DIAGNOSIS — F29 Unspecified psychosis not due to a substance or known physiological condition: Secondary | ICD-10-CM | POA: Diagnosis not present

## 2011-06-13 ENCOUNTER — Ambulatory Visit
Admission: RE | Admit: 2011-06-13 | Discharge: 2011-06-13 | Disposition: A | Discharge status: Home / Self Care | Payer: Medicare Other | Source: Ambulatory Visit | Attending: Neurology | Admitting: Neurology

## 2011-06-13 DIAGNOSIS — R410 Disorientation, unspecified: Secondary | ICD-10-CM

## 2011-06-13 DIAGNOSIS — F341 Dysthymic disorder: Secondary | ICD-10-CM

## 2011-06-13 DIAGNOSIS — F29 Unspecified psychosis not due to a substance or known physiological condition: Secondary | ICD-10-CM | POA: Diagnosis not present

## 2011-06-13 DIAGNOSIS — I2510 Atherosclerotic heart disease of native coronary artery without angina pectoris: Secondary | ICD-10-CM

## 2011-06-13 DIAGNOSIS — F4310 Post-traumatic stress disorder, unspecified: Secondary | ICD-10-CM

## 2011-06-20 DIAGNOSIS — I6529 Occlusion and stenosis of unspecified carotid artery: Secondary | ICD-10-CM | POA: Diagnosis not present

## 2011-07-23 DIAGNOSIS — R5381 Other malaise: Secondary | ICD-10-CM | POA: Diagnosis not present

## 2011-07-23 DIAGNOSIS — R6889 Other general symptoms and signs: Secondary | ICD-10-CM | POA: Diagnosis not present

## 2011-07-23 DIAGNOSIS — D649 Anemia, unspecified: Secondary | ICD-10-CM | POA: Diagnosis not present

## 2011-08-07 DIAGNOSIS — M5126 Other intervertebral disc displacement, lumbar region: Secondary | ICD-10-CM | POA: Diagnosis not present

## 2011-08-07 DIAGNOSIS — M47817 Spondylosis without myelopathy or radiculopathy, lumbosacral region: Secondary | ICD-10-CM | POA: Diagnosis not present

## 2011-09-18 DIAGNOSIS — R109 Unspecified abdominal pain: Secondary | ICD-10-CM | POA: Diagnosis not present

## 2011-09-18 DIAGNOSIS — R141 Gas pain: Secondary | ICD-10-CM | POA: Diagnosis not present

## 2011-09-18 DIAGNOSIS — K5090 Crohn's disease, unspecified, without complications: Secondary | ICD-10-CM | POA: Diagnosis not present

## 2011-09-18 DIAGNOSIS — R142 Eructation: Secondary | ICD-10-CM | POA: Diagnosis not present

## 2011-09-24 DIAGNOSIS — I472 Ventricular tachycardia: Secondary | ICD-10-CM | POA: Diagnosis not present

## 2011-09-24 DIAGNOSIS — Z45018 Encounter for adjustment and management of other part of cardiac pacemaker: Secondary | ICD-10-CM | POA: Diagnosis not present

## 2011-09-24 DIAGNOSIS — I2510 Atherosclerotic heart disease of native coronary artery without angina pectoris: Secondary | ICD-10-CM | POA: Diagnosis not present

## 2011-10-25 DIAGNOSIS — F4310 Post-traumatic stress disorder, unspecified: Secondary | ICD-10-CM | POA: Diagnosis not present

## 2011-10-25 DIAGNOSIS — I2510 Atherosclerotic heart disease of native coronary artery without angina pectoris: Secondary | ICD-10-CM | POA: Diagnosis not present

## 2011-10-25 DIAGNOSIS — G4700 Insomnia, unspecified: Secondary | ICD-10-CM | POA: Diagnosis not present

## 2011-10-25 DIAGNOSIS — I10 Essential (primary) hypertension: Secondary | ICD-10-CM | POA: Diagnosis not present

## 2011-11-07 DIAGNOSIS — N3941 Urge incontinence: Secondary | ICD-10-CM | POA: Diagnosis not present

## 2011-11-07 DIAGNOSIS — N318 Other neuromuscular dysfunction of bladder: Secondary | ICD-10-CM | POA: Diagnosis not present

## 2011-11-07 DIAGNOSIS — N401 Enlarged prostate with lower urinary tract symptoms: Secondary | ICD-10-CM | POA: Diagnosis not present

## 2011-11-07 DIAGNOSIS — R351 Nocturia: Secondary | ICD-10-CM | POA: Diagnosis not present

## 2011-11-18 DIAGNOSIS — M25559 Pain in unspecified hip: Secondary | ICD-10-CM | POA: Diagnosis not present

## 2011-11-18 DIAGNOSIS — M545 Low back pain: Secondary | ICD-10-CM | POA: Diagnosis not present

## 2011-11-18 DIAGNOSIS — M25569 Pain in unspecified knee: Secondary | ICD-10-CM | POA: Diagnosis not present

## 2012-01-14 DIAGNOSIS — I7389 Other specified peripheral vascular diseases: Secondary | ICD-10-CM | POA: Diagnosis not present

## 2012-01-14 DIAGNOSIS — E782 Mixed hyperlipidemia: Secondary | ICD-10-CM | POA: Diagnosis not present

## 2012-01-14 DIAGNOSIS — I2510 Atherosclerotic heart disease of native coronary artery without angina pectoris: Secondary | ICD-10-CM | POA: Diagnosis not present

## 2012-01-14 DIAGNOSIS — R5381 Other malaise: Secondary | ICD-10-CM | POA: Diagnosis not present

## 2012-01-14 DIAGNOSIS — R5383 Other fatigue: Secondary | ICD-10-CM | POA: Diagnosis not present

## 2012-01-14 DIAGNOSIS — R6889 Other general symptoms and signs: Secondary | ICD-10-CM | POA: Diagnosis not present

## 2012-01-17 ENCOUNTER — Other Ambulatory Visit (HOSPITAL_COMMUNITY): Payer: Self-pay | Admitting: Cardiovascular Disease

## 2012-01-17 DIAGNOSIS — I359 Nonrheumatic aortic valve disorder, unspecified: Secondary | ICD-10-CM

## 2012-01-23 DIAGNOSIS — M48061 Spinal stenosis, lumbar region without neurogenic claudication: Secondary | ICD-10-CM | POA: Diagnosis not present

## 2012-01-23 DIAGNOSIS — M5137 Other intervertebral disc degeneration, lumbosacral region: Secondary | ICD-10-CM | POA: Diagnosis not present

## 2012-01-23 DIAGNOSIS — M47817 Spondylosis without myelopathy or radiculopathy, lumbosacral region: Secondary | ICD-10-CM | POA: Diagnosis not present

## 2012-01-24 ENCOUNTER — Ambulatory Visit (HOSPITAL_COMMUNITY): Payer: Medicare Other

## 2012-01-27 ENCOUNTER — Ambulatory Visit (HOSPITAL_COMMUNITY)
Admission: RE | Admit: 2012-01-27 | Discharge: 2012-01-27 | Disposition: A | Discharge status: Home / Self Care | Payer: Medicare Other | Source: Ambulatory Visit | Attending: Cardiovascular Disease | Admitting: Cardiovascular Disease

## 2012-01-27 DIAGNOSIS — I359 Nonrheumatic aortic valve disorder, unspecified: Secondary | ICD-10-CM | POA: Diagnosis not present

## 2012-01-27 DIAGNOSIS — I517 Cardiomegaly: Secondary | ICD-10-CM | POA: Insufficient documentation

## 2012-01-27 DIAGNOSIS — I2510 Atherosclerotic heart disease of native coronary artery without angina pectoris: Secondary | ICD-10-CM | POA: Insufficient documentation

## 2012-01-27 DIAGNOSIS — I079 Rheumatic tricuspid valve disease, unspecified: Secondary | ICD-10-CM | POA: Insufficient documentation

## 2012-01-27 NOTE — Progress Notes (Signed)
2D Echo Performed 01/27/2012    Horton Ellithorpe, RCS  

## 2012-02-13 DIAGNOSIS — M47817 Spondylosis without myelopathy or radiculopathy, lumbosacral region: Secondary | ICD-10-CM | POA: Diagnosis not present

## 2012-02-13 DIAGNOSIS — M5137 Other intervertebral disc degeneration, lumbosacral region: Secondary | ICD-10-CM | POA: Diagnosis not present

## 2012-03-24 DIAGNOSIS — R972 Elevated prostate specific antigen [PSA]: Secondary | ICD-10-CM | POA: Diagnosis not present

## 2012-03-27 DIAGNOSIS — M5137 Other intervertebral disc degeneration, lumbosacral region: Secondary | ICD-10-CM | POA: Diagnosis not present

## 2012-03-27 DIAGNOSIS — M47817 Spondylosis without myelopathy or radiculopathy, lumbosacral region: Secondary | ICD-10-CM | POA: Diagnosis not present

## 2012-03-27 DIAGNOSIS — M25559 Pain in unspecified hip: Secondary | ICD-10-CM | POA: Diagnosis not present

## 2012-03-31 DIAGNOSIS — L711 Rhinophyma: Secondary | ICD-10-CM | POA: Insufficient documentation

## 2012-03-31 DIAGNOSIS — L719 Rosacea, unspecified: Secondary | ICD-10-CM | POA: Diagnosis not present

## 2012-04-29 DIAGNOSIS — I10 Essential (primary) hypertension: Secondary | ICD-10-CM | POA: Diagnosis not present

## 2012-04-29 DIAGNOSIS — I2510 Atherosclerotic heart disease of native coronary artery without angina pectoris: Secondary | ICD-10-CM | POA: Diagnosis not present

## 2012-04-29 DIAGNOSIS — E785 Hyperlipidemia, unspecified: Secondary | ICD-10-CM | POA: Diagnosis not present

## 2012-04-29 DIAGNOSIS — F4310 Post-traumatic stress disorder, unspecified: Secondary | ICD-10-CM | POA: Diagnosis not present

## 2012-04-29 DIAGNOSIS — Z1331 Encounter for screening for depression: Secondary | ICD-10-CM | POA: Diagnosis not present

## 2012-05-12 ENCOUNTER — Other Ambulatory Visit (HOSPITAL_COMMUNITY): Payer: Self-pay | Admitting: Cardiovascular Disease

## 2012-05-12 DIAGNOSIS — R6889 Other general symptoms and signs: Secondary | ICD-10-CM | POA: Diagnosis not present

## 2012-05-12 DIAGNOSIS — I2510 Atherosclerotic heart disease of native coronary artery without angina pectoris: Secondary | ICD-10-CM | POA: Diagnosis not present

## 2012-05-12 DIAGNOSIS — Z45018 Encounter for adjustment and management of other part of cardiac pacemaker: Secondary | ICD-10-CM | POA: Diagnosis not present

## 2012-05-12 DIAGNOSIS — I472 Ventricular tachycardia: Secondary | ICD-10-CM | POA: Diagnosis not present

## 2012-05-12 DIAGNOSIS — I10 Essential (primary) hypertension: Secondary | ICD-10-CM | POA: Diagnosis not present

## 2012-05-12 DIAGNOSIS — I701 Atherosclerosis of renal artery: Secondary | ICD-10-CM

## 2012-05-12 DIAGNOSIS — R0989 Other specified symptoms and signs involving the circulatory and respiratory systems: Secondary | ICD-10-CM

## 2012-05-12 DIAGNOSIS — R5383 Other fatigue: Secondary | ICD-10-CM | POA: Diagnosis not present

## 2012-06-09 ENCOUNTER — Encounter (HOSPITAL_COMMUNITY): Payer: Medicare Other

## 2012-06-23 ENCOUNTER — Ambulatory Visit (HOSPITAL_COMMUNITY)
Admission: RE | Admit: 2012-06-23 | Discharge: 2012-06-23 | Disposition: A | Discharge status: Home / Self Care | Payer: Medicare Other | Source: Ambulatory Visit | Attending: Cardiovascular Disease | Admitting: Cardiovascular Disease

## 2012-06-23 DIAGNOSIS — I10 Essential (primary) hypertension: Secondary | ICD-10-CM | POA: Diagnosis not present

## 2012-06-23 DIAGNOSIS — R0989 Other specified symptoms and signs involving the circulatory and respiratory systems: Secondary | ICD-10-CM | POA: Diagnosis not present

## 2012-06-23 DIAGNOSIS — I701 Atherosclerosis of renal artery: Secondary | ICD-10-CM

## 2012-06-23 DIAGNOSIS — I6529 Occlusion and stenosis of unspecified carotid artery: Secondary | ICD-10-CM | POA: Diagnosis not present

## 2012-06-23 NOTE — Progress Notes (Signed)
Carotid artery duplex doppler was completed. Hermes Wafer RVT 

## 2012-06-23 NOTE — Progress Notes (Signed)
Renal Artery Duplex Completed. Kevin Wiley

## 2012-06-30 ENCOUNTER — Telehealth: Payer: Self-pay | Admitting: Cardiovascular Disease

## 2012-07-22 DIAGNOSIS — IMO0002 Reserved for concepts with insufficient information to code with codable children: Secondary | ICD-10-CM | POA: Diagnosis not present

## 2012-07-22 DIAGNOSIS — M47817 Spondylosis without myelopathy or radiculopathy, lumbosacral region: Secondary | ICD-10-CM | POA: Diagnosis not present

## 2012-07-22 DIAGNOSIS — M5137 Other intervertebral disc degeneration, lumbosacral region: Secondary | ICD-10-CM | POA: Diagnosis not present

## 2012-07-22 DIAGNOSIS — M5126 Other intervertebral disc displacement, lumbar region: Secondary | ICD-10-CM | POA: Diagnosis not present

## 2012-07-23 ENCOUNTER — Other Ambulatory Visit: Payer: Self-pay | Admitting: Neurosurgery

## 2012-07-23 DIAGNOSIS — M5416 Radiculopathy, lumbar region: Secondary | ICD-10-CM

## 2012-07-24 ENCOUNTER — Ambulatory Visit
Admission: RE | Admit: 2012-07-24 | Discharge: 2012-07-24 | Disposition: A | Discharge status: Home / Self Care | Payer: Medicare Other | Source: Ambulatory Visit | Attending: Neurosurgery | Admitting: Neurosurgery

## 2012-07-24 DIAGNOSIS — M5126 Other intervertebral disc displacement, lumbar region: Secondary | ICD-10-CM | POA: Diagnosis not present

## 2012-07-24 DIAGNOSIS — M5416 Radiculopathy, lumbar region: Secondary | ICD-10-CM

## 2012-07-24 DIAGNOSIS — M47817 Spondylosis without myelopathy or radiculopathy, lumbosacral region: Secondary | ICD-10-CM | POA: Diagnosis not present

## 2012-08-11 DIAGNOSIS — M546 Pain in thoracic spine: Secondary | ICD-10-CM | POA: Diagnosis not present

## 2012-08-11 DIAGNOSIS — IMO0002 Reserved for concepts with insufficient information to code with codable children: Secondary | ICD-10-CM | POA: Diagnosis not present

## 2012-08-11 DIAGNOSIS — M48062 Spinal stenosis, lumbar region with neurogenic claudication: Secondary | ICD-10-CM | POA: Diagnosis not present

## 2012-08-11 DIAGNOSIS — M47817 Spondylosis without myelopathy or radiculopathy, lumbosacral region: Secondary | ICD-10-CM | POA: Diagnosis not present

## 2012-08-11 DIAGNOSIS — M5137 Other intervertebral disc degeneration, lumbosacral region: Secondary | ICD-10-CM | POA: Diagnosis not present

## 2012-08-20 DIAGNOSIS — IMO0002 Reserved for concepts with insufficient information to code with codable children: Secondary | ICD-10-CM | POA: Diagnosis not present

## 2012-08-20 DIAGNOSIS — M48061 Spinal stenosis, lumbar region without neurogenic claudication: Secondary | ICD-10-CM | POA: Diagnosis not present

## 2012-08-20 DIAGNOSIS — M47817 Spondylosis without myelopathy or radiculopathy, lumbosacral region: Secondary | ICD-10-CM | POA: Diagnosis not present

## 2012-08-20 DIAGNOSIS — M5137 Other intervertebral disc degeneration, lumbosacral region: Secondary | ICD-10-CM | POA: Diagnosis not present

## 2012-08-25 DIAGNOSIS — R143 Flatulence: Secondary | ICD-10-CM | POA: Diagnosis not present

## 2012-08-25 DIAGNOSIS — R141 Gas pain: Secondary | ICD-10-CM | POA: Diagnosis not present

## 2012-08-25 DIAGNOSIS — K5090 Crohn's disease, unspecified, without complications: Secondary | ICD-10-CM | POA: Diagnosis not present

## 2012-08-27 ENCOUNTER — Other Ambulatory Visit: Payer: Self-pay | Admitting: Cardiovascular Disease

## 2012-08-27 ENCOUNTER — Other Ambulatory Visit: Payer: Self-pay

## 2012-08-27 DIAGNOSIS — R5383 Other fatigue: Secondary | ICD-10-CM | POA: Diagnosis not present

## 2012-08-27 DIAGNOSIS — R6889 Other general symptoms and signs: Secondary | ICD-10-CM | POA: Diagnosis not present

## 2012-08-27 DIAGNOSIS — R55 Syncope and collapse: Secondary | ICD-10-CM | POA: Diagnosis not present

## 2012-08-27 DIAGNOSIS — R5381 Other malaise: Secondary | ICD-10-CM | POA: Diagnosis not present

## 2012-08-27 DIAGNOSIS — E782 Mixed hyperlipidemia: Secondary | ICD-10-CM | POA: Diagnosis not present

## 2012-08-27 DIAGNOSIS — I2510 Atherosclerotic heart disease of native coronary artery without angina pectoris: Secondary | ICD-10-CM | POA: Diagnosis not present

## 2012-08-27 LAB — PACEMAKER DEVICE OBSERVATION
AL AMPLITUDE: 4.5 mv
BATTERY VOLTAGE: 2.95 V
RV LEAD IMPEDENCE PM: 504 Ohm
VENTRICULAR PACING PM: 18

## 2012-08-28 ENCOUNTER — Encounter: Payer: Self-pay | Admitting: Cardiovascular Disease

## 2012-08-28 LAB — COMPREHENSIVE METABOLIC PANEL
ALT: 15 U/L (ref 0–53)
CO2: 29 mEq/L (ref 19–32)
Calcium: 9.2 mg/dL (ref 8.4–10.5)
Chloride: 100 mEq/L (ref 96–112)
Sodium: 137 mEq/L (ref 135–145)
Total Bilirubin: 0.6 mg/dL (ref 0.3–1.2)
Total Protein: 6.6 g/dL (ref 6.0–8.3)

## 2012-08-28 LAB — CBC WITH DIFFERENTIAL/PLATELET
Lymphocytes Relative: 16 % (ref 12–46)
Lymphs Abs: 2.1 10*3/uL (ref 0.7–4.0)
Neutro Abs: 9.5 10*3/uL — ABNORMAL HIGH (ref 1.7–7.7)
Neutrophils Relative %: 77 % (ref 43–77)
Platelets: 215 10*3/uL (ref 150–400)
RBC: 4.61 MIL/uL (ref 4.22–5.81)
WBC: 12.6 10*3/uL — ABNORMAL HIGH (ref 4.0–10.5)

## 2012-08-28 LAB — TSH: TSH: 1.97 u[IU]/mL (ref 0.350–4.500)

## 2012-09-01 DIAGNOSIS — Z6827 Body mass index (BMI) 27.0-27.9, adult: Secondary | ICD-10-CM | POA: Diagnosis not present

## 2012-09-01 DIAGNOSIS — R634 Abnormal weight loss: Secondary | ICD-10-CM | POA: Diagnosis not present

## 2012-09-01 DIAGNOSIS — I10 Essential (primary) hypertension: Secondary | ICD-10-CM | POA: Diagnosis not present

## 2012-09-01 DIAGNOSIS — K5010 Crohn's disease of large intestine without complications: Secondary | ICD-10-CM | POA: Diagnosis not present

## 2012-09-16 DIAGNOSIS — D0460 Carcinoma in situ of skin of unspecified upper limb, including shoulder: Secondary | ICD-10-CM | POA: Diagnosis not present

## 2012-09-16 DIAGNOSIS — L719 Rosacea, unspecified: Secondary | ICD-10-CM | POA: Diagnosis not present

## 2012-09-16 DIAGNOSIS — R0609 Other forms of dyspnea: Secondary | ICD-10-CM | POA: Diagnosis not present

## 2012-09-16 DIAGNOSIS — L280 Lichen simplex chronicus: Secondary | ICD-10-CM | POA: Diagnosis not present

## 2012-09-16 DIAGNOSIS — D489 Neoplasm of uncertain behavior, unspecified: Secondary | ICD-10-CM | POA: Diagnosis not present

## 2012-09-22 DIAGNOSIS — C44621 Squamous cell carcinoma of skin of unspecified upper limb, including shoulder: Secondary | ICD-10-CM | POA: Diagnosis not present

## 2012-09-23 DIAGNOSIS — IMO0002 Reserved for concepts with insufficient information to code with codable children: Secondary | ICD-10-CM | POA: Diagnosis not present

## 2012-09-23 DIAGNOSIS — M48061 Spinal stenosis, lumbar region without neurogenic claudication: Secondary | ICD-10-CM | POA: Diagnosis not present

## 2012-09-23 DIAGNOSIS — M5137 Other intervertebral disc degeneration, lumbosacral region: Secondary | ICD-10-CM | POA: Diagnosis not present

## 2012-09-23 DIAGNOSIS — M47817 Spondylosis without myelopathy or radiculopathy, lumbosacral region: Secondary | ICD-10-CM | POA: Diagnosis not present

## 2012-10-03 ENCOUNTER — Encounter (HOSPITAL_BASED_OUTPATIENT_CLINIC_OR_DEPARTMENT_OTHER): Payer: Self-pay | Admitting: Emergency Medicine

## 2012-10-03 ENCOUNTER — Emergency Department (HOSPITAL_BASED_OUTPATIENT_CLINIC_OR_DEPARTMENT_OTHER): Payer: Medicare Other

## 2012-10-03 ENCOUNTER — Emergency Department (HOSPITAL_BASED_OUTPATIENT_CLINIC_OR_DEPARTMENT_OTHER)
Admission: EM | Admit: 2012-10-03 | Discharge: 2012-10-03 | Disposition: A | Discharge status: Home / Self Care | Payer: Medicare Other | Attending: Emergency Medicine | Admitting: Emergency Medicine

## 2012-10-03 DIAGNOSIS — F341 Dysthymic disorder: Secondary | ICD-10-CM | POA: Diagnosis not present

## 2012-10-03 DIAGNOSIS — I10 Essential (primary) hypertension: Secondary | ICD-10-CM | POA: Diagnosis not present

## 2012-10-03 DIAGNOSIS — Z9861 Coronary angioplasty status: Secondary | ICD-10-CM | POA: Diagnosis not present

## 2012-10-03 DIAGNOSIS — Y9389 Activity, other specified: Secondary | ICD-10-CM | POA: Insufficient documentation

## 2012-10-03 DIAGNOSIS — Z950 Presence of cardiac pacemaker: Secondary | ICD-10-CM | POA: Insufficient documentation

## 2012-10-03 DIAGNOSIS — IMO0002 Reserved for concepts with insufficient information to code with codable children: Secondary | ICD-10-CM | POA: Insufficient documentation

## 2012-10-03 DIAGNOSIS — S6000XA Contusion of unspecified finger without damage to nail, initial encounter: Secondary | ICD-10-CM | POA: Diagnosis not present

## 2012-10-03 DIAGNOSIS — M79609 Pain in unspecified limb: Secondary | ICD-10-CM | POA: Diagnosis not present

## 2012-10-03 DIAGNOSIS — I4891 Unspecified atrial fibrillation: Secondary | ICD-10-CM | POA: Insufficient documentation

## 2012-10-03 DIAGNOSIS — Y929 Unspecified place or not applicable: Secondary | ICD-10-CM | POA: Insufficient documentation

## 2012-10-03 DIAGNOSIS — N400 Enlarged prostate without lower urinary tract symptoms: Secondary | ICD-10-CM | POA: Insufficient documentation

## 2012-10-03 DIAGNOSIS — Z87891 Personal history of nicotine dependence: Secondary | ICD-10-CM | POA: Diagnosis not present

## 2012-10-03 DIAGNOSIS — E7800 Pure hypercholesterolemia, unspecified: Secondary | ICD-10-CM | POA: Insufficient documentation

## 2012-10-03 DIAGNOSIS — Z79899 Other long term (current) drug therapy: Secondary | ICD-10-CM | POA: Diagnosis not present

## 2012-10-03 DIAGNOSIS — Z7982 Long term (current) use of aspirin: Secondary | ICD-10-CM | POA: Diagnosis not present

## 2012-10-03 DIAGNOSIS — S6010XA Contusion of unspecified finger with damage to nail, initial encounter: Secondary | ICD-10-CM

## 2012-10-03 HISTORY — DX: Major depressive disorder, single episode, unspecified: F32.9

## 2012-10-03 HISTORY — DX: Depression, unspecified: F32.A

## 2012-10-03 HISTORY — DX: Anxiety disorder, unspecified: F41.9

## 2012-10-03 HISTORY — DX: Essential (primary) hypertension: I10

## 2012-10-03 HISTORY — DX: Crohn's disease, unspecified, without complications: K50.90

## 2012-10-03 HISTORY — DX: Presence of cardiac pacemaker: Z95.0

## 2012-10-03 HISTORY — DX: Benign prostatic hyperplasia without lower urinary tract symptoms: N40.0

## 2012-10-03 HISTORY — DX: Pure hypercholesterolemia, unspecified: E78.00

## 2012-10-03 MED ORDER — IBUPROFEN 800 MG PO TABS
800.0000 mg | ORAL_TABLET | Freq: Once | ORAL | Status: DC
  Filled 2012-10-03: qty 1

## 2012-10-03 MED ORDER — HYDROCODONE-ACETAMINOPHEN 5-325 MG PO TABS: 2.0000 | ORAL_TABLET | ORAL | Status: DC | PRN

## 2012-10-03 NOTE — ED Notes (Signed)
Took in motrin and pt reports cannot take nsaids d/t gi upset.  Will wait for d/c papers and get script filled.

## 2012-10-03 NOTE — ED Provider Notes (Signed)
CSN: [id number]     Arrival date & time 10/03/12  1622 History  This chart was scribed for Rolan Bucco, MD by Karle Plumber, ED Scribe. This patient was seen in room MH02/MH02 and the patient's care was started at 05:35 PM.    Chief Complaint  Patient presents with  . Finger Injury   The history is provided by the patient. No language interpreter was used.   HPI Comments:  Kevin Wiley is a 72 y.o. male who presents to the Emergency Department complaining of worsening, severe, non-radiating left thumb pain after hitting it with a hammer about 7 AM this morning.  He describes the pain as throbbing. He states that he has had similar injuries to this before. He denies any other injuries at this time. Pt reports his tetanus vaccine is UTD. He has a medical history of atrial fibrillation, hypertension, hypercholesteremia, BPH and pacemaker. He is a former smoker.  Past Medical History  Diagnosis Date  . Pacemaker   . Atrial fibrillation   . Anxiety   . Depression   . Hypertension   . Hypercholesteremia   . BPH (benign prostatic hyperplasia)    Past Surgical History  Procedure Laterality Date  . Coronary stent placement     No family history on file. History  Substance Use Topics  . Smoking status: Former Games developer  . Smokeless tobacco: Not on file  . Alcohol Use: Yes     Comment: Ocassionally    Review of Systems  HENT: Negative for neck pain.   Gastrointestinal: Negative for nausea and vomiting.  Musculoskeletal: Positive for joint swelling and arthralgias. Negative for back pain.  Skin: Negative for wound.  Neurological: Negative for weakness and numbness.    Allergies  Review of patient's allergies indicates no known allergies.  Home Medications   Current Outpatient Rx  Name  Route  Sig  Dispense  Refill  . amLODipine (NORVASC) 10 MG tablet   Oral   Take 10 mg by mouth daily.         Marland Kitchen aspirin 81 MG tablet   Oral   Take 81 mg by mouth daily.         Marland Kitchen  atenolol (TENORMIN) 100 MG tablet   Oral   Take 100 mg by mouth daily.         . benazepril (LOTENSIN) 40 MG tablet   Oral   Take 40 mg by mouth daily.         . colestipol (COLESTID) 1 G tablet   Oral   Take 1 g by mouth 2 (two) times daily.         . cyclobenzaprine (FLEXERIL) 10 MG tablet   Oral   Take 10 mg by mouth 3 (three) times daily as needed for muscle spasms.         . diazepam (VALIUM) 10 MG tablet   Oral   Take 10 mg by mouth every 6 (six) hours as needed for anxiety.         . finasteride (PROSCAR) 5 MG tablet   Oral   Take 5 mg by mouth daily.         . fish oil-omega-3 fatty acids 1000 MG capsule   Oral   Take 2 g by mouth daily.         Marland Kitchen gabapentin (NEURONTIN) 800 MG tablet   Oral   Take 800 mg by mouth 3 (three) times daily.         Marland Kitchen  omeprazole (PRILOSEC) 20 MG capsule   Oral   Take 20 mg by mouth daily.         . rosuvastatin (CRESTOR) 40 MG tablet   Oral   Take 40 mg by mouth daily.         Marland Kitchen sulfaSALAzine (AZULFIDINE) 500 MG tablet   Oral   Take 500 mg by mouth 4 (four) times daily.         . tamsulosin (FLOMAX) 0.4 MG CAPS capsule   Oral   Take 0.4 mg by mouth.         . traZODone (DESYREL) 100 MG tablet   Oral   Take 200 mg by mouth at bedtime.         Marland Kitchen venlafaxine XR (EFFEXOR-XR) 150 MG 24 hr capsule   Oral   Take 150 mg by mouth daily.         Marland Kitchen zolpidem (AMBIEN) 10 MG tablet   Oral   Take 10 mg by mouth at bedtime as needed for sleep.         Marland Kitchen HYDROcodone-acetaminophen (NORCO/VICODIN) 5-325 MG per tablet   Oral   Take 2 tablets by mouth every 4 (four) hours as needed.   15 tablet   0    Triage Vitals: BP 134/77  Pulse 65  Temp(Src) 98.2 F (36.8 C) (Oral)  Resp 18  Ht 5\' 9"  (1.753 m)  Wt 178 lb (80.74 kg)  BMI 26.27 kg/m2  SpO2 99% Physical Exam  Constitutional: He is oriented to person, place, and time. He appears well-developed and well-nourished.  HENT:  Head: Normocephalic  and atraumatic.  Neck: Normal range of motion. Neck supple.  Cardiovascular: Normal rate.   Pulmonary/Chest: Effort normal.  Musculoskeletal: He exhibits edema and tenderness.  Patient has mild swelling to the left palm overlying the distal phalanx. There is no other bony tenderness to the hand. He is able flex and extend the finger without problem. He has normal sensation distally. Capillary refill is less than 2 distally. He has a 50% occupying subungual hematoma. No abrasions or lacerations are noted.  Neurological: He is alert and oriented to person, place, and time.  Skin: Skin is warm and dry.  Psychiatric: He has a normal mood and affect.    ED Course  Procedures (including critical care time) DIAGNOSTIC STUDIES: Oxygen Saturation is 99% on RA, normal by my interpretation.   COORDINATION OF CARE: 5:40 PM-Patient advised of plan to drain the blood collection under his left thumb nail for treatment and patient agrees.  Medications  ibuprofen (ADVIL,MOTRIN) tablet 800 mg (not administered)    Labs Review Labs Reviewed - No data to display  Imaging Review Dg Finger Thumb Left  10/03/2012   *RADIOLOGY REPORT*  Clinical Data: Blow to the right thumb.  Pain.  LEFT THUMB 2+V  Comparison: None.  Findings: No acute bony or joint abnormality is identified.  Mild first CMC degenerative change is noted.  IMPRESSION: No acute finding.   Original Report Authenticated By: Holley Dexter, M.D.    MDM   1. Subungual hematoma of finger of left hand, initial encounter    Procedure note Patient's nail was prepped with chlorhexidine. Using a cautery device, a hole was made into the center of the nail. There was blood return following this. Patient tolerated the procedure well. The wound was dressed.  I personally performed the services described in this documentation, which was scribed in my presence.  The recorded information has been reviewed  and considered.    Rolan Bucco,  MD 10/03/12 [phone]

## 2012-10-03 NOTE — ED Notes (Signed)
Ice pack offered.  MD at bedside.

## 2012-10-03 NOTE — ED Notes (Signed)
Pt was attempting to hammer another hole in his belt with a hammer/screw driver.  Missed the screwdriver and struck his left thumb with the blunt end of the hammer.

## 2012-10-03 NOTE — ED Notes (Signed)
Patient transported to X-ray ambulatory with tech. 

## 2012-10-23 DIAGNOSIS — Z125 Encounter for screening for malignant neoplasm of prostate: Secondary | ICD-10-CM | POA: Diagnosis not present

## 2012-10-23 DIAGNOSIS — I10 Essential (primary) hypertension: Secondary | ICD-10-CM | POA: Diagnosis not present

## 2012-10-23 DIAGNOSIS — E785 Hyperlipidemia, unspecified: Secondary | ICD-10-CM | POA: Diagnosis not present

## 2012-10-30 DIAGNOSIS — R05 Cough: Secondary | ICD-10-CM | POA: Diagnosis not present

## 2012-10-30 DIAGNOSIS — Z8679 Personal history of other diseases of the circulatory system: Secondary | ICD-10-CM | POA: Diagnosis not present

## 2012-10-30 DIAGNOSIS — Z6827 Body mass index (BMI) 27.0-27.9, adult: Secondary | ICD-10-CM | POA: Diagnosis not present

## 2012-10-30 DIAGNOSIS — Z0000 Encounter for general adult medical examination without abnormal findings: Secondary | ICD-10-CM | POA: Diagnosis not present

## 2012-10-30 DIAGNOSIS — E785 Hyperlipidemia, unspecified: Secondary | ICD-10-CM | POA: Diagnosis not present

## 2012-10-30 DIAGNOSIS — I10 Essential (primary) hypertension: Secondary | ICD-10-CM | POA: Diagnosis not present

## 2012-10-30 DIAGNOSIS — I2510 Atherosclerotic heart disease of native coronary artery without angina pectoris: Secondary | ICD-10-CM | POA: Diagnosis not present

## 2012-10-30 DIAGNOSIS — F4310 Post-traumatic stress disorder, unspecified: Secondary | ICD-10-CM | POA: Diagnosis not present

## 2012-10-30 DIAGNOSIS — K5010 Crohn's disease of large intestine without complications: Secondary | ICD-10-CM | POA: Diagnosis not present

## 2012-11-10 DIAGNOSIS — I998 Other disorder of circulatory system: Secondary | ICD-10-CM | POA: Diagnosis not present

## 2012-11-10 DIAGNOSIS — M5137 Other intervertebral disc degeneration, lumbosacral region: Secondary | ICD-10-CM | POA: Diagnosis not present

## 2012-11-10 DIAGNOSIS — M47817 Spondylosis without myelopathy or radiculopathy, lumbosacral region: Secondary | ICD-10-CM | POA: Diagnosis not present

## 2012-11-10 DIAGNOSIS — M48062 Spinal stenosis, lumbar region with neurogenic claudication: Secondary | ICD-10-CM | POA: Diagnosis not present

## 2012-11-24 DIAGNOSIS — R269 Unspecified abnormalities of gait and mobility: Secondary | ICD-10-CM | POA: Diagnosis not present

## 2012-11-24 DIAGNOSIS — R5381 Other malaise: Secondary | ICD-10-CM | POA: Diagnosis not present

## 2012-11-24 DIAGNOSIS — I10 Essential (primary) hypertension: Secondary | ICD-10-CM | POA: Diagnosis not present

## 2012-11-24 DIAGNOSIS — S0003XA Contusion of scalp, initial encounter: Secondary | ICD-10-CM | POA: Diagnosis not present

## 2012-11-24 DIAGNOSIS — Z6828 Body mass index (BMI) 28.0-28.9, adult: Secondary | ICD-10-CM | POA: Diagnosis not present

## 2012-12-04 ENCOUNTER — Ambulatory Visit (INDEPENDENT_AMBULATORY_CARE_PROVIDER_SITE_OTHER): Payer: Medicare Other | Admitting: Cardiovascular Disease

## 2012-12-04 ENCOUNTER — Encounter: Payer: Self-pay | Admitting: Cardiovascular Disease

## 2012-12-04 VITALS — BP 88/64 | HR 64 | Resp 16 | Ht 69.0 in | Wt 188.1 lb

## 2012-12-04 DIAGNOSIS — I495 Sick sinus syndrome: Secondary | ICD-10-CM

## 2012-12-04 DIAGNOSIS — I951 Orthostatic hypotension: Secondary | ICD-10-CM | POA: Diagnosis not present

## 2012-12-04 DIAGNOSIS — E782 Mixed hyperlipidemia: Secondary | ICD-10-CM

## 2012-12-04 DIAGNOSIS — I2510 Atherosclerotic heart disease of native coronary artery without angina pectoris: Secondary | ICD-10-CM | POA: Diagnosis not present

## 2012-12-04 DIAGNOSIS — R5383 Other fatigue: Secondary | ICD-10-CM

## 2012-12-04 DIAGNOSIS — I10 Essential (primary) hypertension: Secondary | ICD-10-CM | POA: Insufficient documentation

## 2012-12-04 DIAGNOSIS — Z950 Presence of cardiac pacemaker: Secondary | ICD-10-CM

## 2012-12-04 DIAGNOSIS — R5381 Other malaise: Secondary | ICD-10-CM

## 2012-12-04 DIAGNOSIS — I701 Atherosclerosis of renal artery: Secondary | ICD-10-CM

## 2012-12-04 DIAGNOSIS — F4310 Post-traumatic stress disorder, unspecified: Secondary | ICD-10-CM | POA: Insufficient documentation

## 2012-12-04 DIAGNOSIS — K5090 Crohn's disease, unspecified, without complications: Secondary | ICD-10-CM

## 2012-12-04 HISTORY — DX: Mixed hyperlipidemia: E78.2

## 2012-12-04 HISTORY — DX: Other fatigue: R53.83

## 2012-12-04 HISTORY — DX: Atherosclerosis of renal artery: I70.1

## 2012-12-04 NOTE — Patient Instructions (Addendum)
Remote monitoring is used to monitor your pacemaker from home. This monitoring reduces the number of office visits required to check your device to one time per year. It allows [place] to keep an eye on the functioning of your device to ensure it is working properly. You are scheduled for a device check from home on 03-08-2013. You may send your transmission at any time that day. If you have a wireless device, the transmission will be sent automatically. After your physician reviews your transmission, you will receive a postcard with your next transmission date.  Your physician recommends that you schedule a follow-up appointment in: 6 months
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
## 2012-12-04 NOTE — Assessment & Plan Note (Signed)
Certainly this is multifactorial, but he has clear evidence of chronotropic incompetence. This may improve with reduction in the beta blocker dose, but we also increased the settings on his accelerometer sensor to provide faster atrial pacing during activity.

## 2012-12-04 NOTE — Assessment & Plan Note (Signed)
No current symptoms of angina. His last cardiac catheterization was performed in 2008 and showed that his proximal LAD stent was widely patent. Upstream of the stent there was a 40-50% LAD lesion which was unchanged when compared with a previous angiogram from 1999. He also had a 30% proximal right coronary artery stenosis. His most recent functional study was a [place] Myoview performed in March of 2013 showing a fixed inferior defect due to attenuation artifact but also anteroapical and apical septal defect consistent with old LAD scar. By echocardiography his EF is 50-55% with mild diastolic dysfunction by nuclear stress testing his EF is 64%.

## 2012-12-04 NOTE — Assessment & Plan Note (Signed)
Normal device check in office today. As mentioned above, the sensor was made more aggressive because of fatigue and signs of chronotropic incompetence. No other permanent device setting changes are made. He is advised to perform remote pacemaker checks at least once every 3 months since his pacemaker model has had potential problems with rapid battery depletion. Is not pacemaker dependent but his underlying rhythm is marked sinus bradycardia at about 40 beats per minute. His records suggest that he has also had problems with heart block. Currently he is pacing the ventricle roughly 18% of the time in the atrium roughly 80% of the time. Only one episode of atrial tachycardia has been recorded and was very brief. There've been no meaningful ventricular rhythm abnormalities.

## 2012-12-04 NOTE — Assessment & Plan Note (Signed)
Bilateral renal artery stents placed 15 years ago were patent by ultrasound performed in May of this year.

## 2012-12-04 NOTE — Assessment & Plan Note (Signed)
His lipid profile was only borderline satisfactory. Both his LDL cholesterol and his triglycerides are higher that the desirable range. He is already on combination therapy with Colestid and Crestor and a high dose. I do not think adding one more agents would be the solution, but have advised him to try to lose the weight that he has gained. He states that he can easily lose weight down to about 172 pounds. Recheck lipid profile in about 6 months

## 2012-12-04 NOTE — Assessment & Plan Note (Signed)
There seems to be a loose temporal association of worsening orthostatic dizziness with the initiation of treatment with Flomax. His medications have been gradually reduced, but he continues to have frequent episodes of dizziness and staggering, almost falling. His blood pressure standing today was 88/64 mm Hg. Rechecked a few minutes later while sitting his blood pressure is 130/90 mm Hg. I have recommended that he reduce the dose of atenolol to 25 mg once daily.

## 2012-12-07 ENCOUNTER — Telehealth: Payer: Self-pay | Admitting: Cardiovascular Disease

## 2012-12-07 ENCOUNTER — Other Ambulatory Visit: Payer: Self-pay | Admitting: *Deleted

## 2012-12-07 MED ORDER — BENAZEPRIL HCL 20 MG PO TABS: 20.0000 mg | ORAL_TABLET | Freq: Every day | ORAL | Status: DC

## 2012-12-07 MED ORDER — ATENOLOL 25 MG PO TABS: 25.0000 mg | ORAL_TABLET | Freq: Every day | ORAL | Status: DC

## 2012-12-07 NOTE — Telephone Encounter (Signed)
Returned call and pt verified x 2.

## 2012-12-07 NOTE — Telephone Encounter (Signed)
Please call-concerning his medicine dose being changed-wants new prescription for these changes if possible please.

## 2012-12-07 NOTE — Telephone Encounter (Signed)
Pt asked for Rxs for atenolol and benazepril.  Stated he has been cutting pills to get the rx'd dose and wants Rxs so he can take one pill.  Pt informed Rxs will be printed and left for Dr. Salena Saner to sign.  Once signed, pt requested they be mailed to him to take to the [place].  Informed they will be mailed.  Pt verbalized understanding and agreed w/ plan.

## 2012-12-09 ENCOUNTER — Telehealth: Payer: Self-pay | Admitting: Internal Medicine

## 2012-12-09 LAB — PACEMAKER DEVICE OBSERVATION
AL AMPLITUDE: 4.2 mv
BAMS-0001: 171 {beats}/min
RV LEAD AMPLITUDE: 16.2 mv
RV LEAD IMPEDENCE PM: 480 Ohm
RV LEAD THRESHOLD: 1 V

## 2012-12-09 NOTE — Telephone Encounter (Signed)
Pt saw dr c 12-04-12/mt

## 2012-12-21 ENCOUNTER — Ambulatory Visit: Payer: Medicare Other | Admitting: Cardiovascular Disease

## 2013-01-04 DIAGNOSIS — M1710 Unilateral primary osteoarthritis, unspecified knee: Secondary | ICD-10-CM | POA: Diagnosis not present

## 2013-01-06 ENCOUNTER — Telehealth: Payer: Self-pay | Admitting: *Deleted

## 2013-01-06 NOTE — Telephone Encounter (Signed)
Returned call and pt verified x 2.  Pt informed message received and renal doppler is due "when clinically indicated" per Dr. Kandis Cocking report.  Pt also informed carotid doppler due in May 2015.  Pt informed he will be notified to schedule when due.  Pt verbalized understanding and agreed w/ plan.  Order placed for carotid doppler.

## 2013-01-06 NOTE — Telephone Encounter (Signed)
Pt called to check to see if he needed to have a renal doppler done. He had one in May but he wanted to make sure.

## 2013-02-05 DIAGNOSIS — IMO0002 Reserved for concepts with insufficient information to code with codable children: Secondary | ICD-10-CM | POA: Diagnosis not present

## 2013-02-05 DIAGNOSIS — M1710 Unilateral primary osteoarthritis, unspecified knee: Secondary | ICD-10-CM | POA: Diagnosis not present

## 2013-02-09 DIAGNOSIS — M48062 Spinal stenosis, lumbar region with neurogenic claudication: Secondary | ICD-10-CM | POA: Diagnosis not present

## 2013-02-09 DIAGNOSIS — E669 Obesity, unspecified: Secondary | ICD-10-CM | POA: Diagnosis not present

## 2013-02-09 DIAGNOSIS — M5137 Other intervertebral disc degeneration, lumbosacral region: Secondary | ICD-10-CM | POA: Diagnosis not present

## 2013-02-09 DIAGNOSIS — M47817 Spondylosis without myelopathy or radiculopathy, lumbosacral region: Secondary | ICD-10-CM | POA: Diagnosis not present

## 2013-02-12 DIAGNOSIS — IMO0002 Reserved for concepts with insufficient information to code with codable children: Secondary | ICD-10-CM | POA: Diagnosis not present

## 2013-02-12 DIAGNOSIS — M1710 Unilateral primary osteoarthritis, unspecified knee: Secondary | ICD-10-CM | POA: Diagnosis not present

## 2013-02-19 DIAGNOSIS — M1710 Unilateral primary osteoarthritis, unspecified knee: Secondary | ICD-10-CM | POA: Diagnosis not present

## 2013-02-19 DIAGNOSIS — IMO0002 Reserved for concepts with insufficient information to code with codable children: Secondary | ICD-10-CM | POA: Diagnosis not present

## 2013-03-08 ENCOUNTER — Ambulatory Visit (INDEPENDENT_AMBULATORY_CARE_PROVIDER_SITE_OTHER): Payer: Medicare Other | Admitting: *Deleted

## 2013-03-08 DIAGNOSIS — I495 Sick sinus syndrome: Secondary | ICD-10-CM | POA: Diagnosis not present

## 2013-03-12 DIAGNOSIS — M1710 Unilateral primary osteoarthritis, unspecified knee: Secondary | ICD-10-CM | POA: Diagnosis not present

## 2013-03-16 LAB — MDC_IDC_ENUM_SESS_TYPE_REMOTE
Brady Statistic AP VS Percent: 73.6 %
Brady Statistic AS VP Percent: 0.1 %
Brady Statistic AS VS Percent: 25.4 %
Lead Channel Sensing Intrinsic Amplitude: 14.8 mV
Lead Channel Setting Pacing Amplitude: 2 V
Lead Channel Setting Pacing Pulse Width: 0.4 ms
Lead Channel Setting Sensing Sensitivity: 0.9 mV
MDC IDC MSMT LEADCHNL RA IMPEDANCE VALUE: 424 Ohm
MDC IDC MSMT LEADCHNL RA SENSING INTR AMPL: 4.5 mV
MDC IDC MSMT LEADCHNL RV IMPEDANCE VALUE: 472 Ohm
MDC IDC SET LEADCHNL RA PACING AMPLITUDE: 2 V
MDC IDC STAT BRADY AP VP PERCENT: 1 %

## 2013-04-05 ENCOUNTER — Encounter: Payer: Self-pay | Admitting: *Deleted

## 2013-04-08 ENCOUNTER — Encounter: Payer: Self-pay | Admitting: Cardiovascular Disease

## 2013-04-08 DIAGNOSIS — M47817 Spondylosis without myelopathy or radiculopathy, lumbosacral region: Secondary | ICD-10-CM | POA: Diagnosis not present

## 2013-04-08 DIAGNOSIS — M48062 Spinal stenosis, lumbar region with neurogenic claudication: Secondary | ICD-10-CM | POA: Diagnosis not present

## 2013-04-08 DIAGNOSIS — M48061 Spinal stenosis, lumbar region without neurogenic claudication: Secondary | ICD-10-CM | POA: Diagnosis not present

## 2013-04-08 DIAGNOSIS — M5137 Other intervertebral disc degeneration, lumbosacral region: Secondary | ICD-10-CM | POA: Diagnosis not present

## 2013-04-08 DIAGNOSIS — M519 Unspecified thoracic, thoracolumbar and lumbosacral intervertebral disc disorder: Secondary | ICD-10-CM | POA: Diagnosis not present

## 2013-04-13 DIAGNOSIS — N401 Enlarged prostate with lower urinary tract symptoms: Secondary | ICD-10-CM | POA: Diagnosis not present

## 2013-04-13 DIAGNOSIS — N318 Other neuromuscular dysfunction of bladder: Secondary | ICD-10-CM | POA: Diagnosis not present

## 2013-04-13 DIAGNOSIS — R3915 Urgency of urination: Secondary | ICD-10-CM | POA: Diagnosis not present

## 2013-04-13 DIAGNOSIS — N139 Obstructive and reflux uropathy, unspecified: Secondary | ICD-10-CM | POA: Diagnosis not present

## 2013-04-13 DIAGNOSIS — N138 Other obstructive and reflux uropathy: Secondary | ICD-10-CM | POA: Diagnosis not present

## 2013-04-28 DIAGNOSIS — F4310 Post-traumatic stress disorder, unspecified: Secondary | ICD-10-CM | POA: Diagnosis not present

## 2013-04-28 DIAGNOSIS — E785 Hyperlipidemia, unspecified: Secondary | ICD-10-CM | POA: Diagnosis not present

## 2013-04-28 DIAGNOSIS — I10 Essential (primary) hypertension: Secondary | ICD-10-CM | POA: Diagnosis not present

## 2013-04-28 DIAGNOSIS — Z6828 Body mass index (BMI) 28.0-28.9, adult: Secondary | ICD-10-CM | POA: Diagnosis not present

## 2013-04-28 DIAGNOSIS — I2510 Atherosclerotic heart disease of native coronary artery without angina pectoris: Secondary | ICD-10-CM | POA: Diagnosis not present

## 2013-05-03 ENCOUNTER — Inpatient Hospital Stay (HOSPITAL_COMMUNITY)
Admission: EM | Admit: 2013-05-03 | Discharge: 2013-05-05 | DRG: 069 | Disposition: A | Discharge status: Home / Self Care | Payer: Medicare Other | Attending: Internal Medicine | Admitting: Internal Medicine

## 2013-05-03 ENCOUNTER — Encounter (HOSPITAL_COMMUNITY): Payer: Self-pay | Admitting: Emergency Medicine

## 2013-05-03 ENCOUNTER — Emergency Department (HOSPITAL_COMMUNITY): Payer: Medicare Other

## 2013-05-03 DIAGNOSIS — R269 Unspecified abnormalities of gait and mobility: Secondary | ICD-10-CM | POA: Diagnosis present

## 2013-05-03 DIAGNOSIS — I4891 Unspecified atrial fibrillation: Secondary | ICD-10-CM | POA: Diagnosis present

## 2013-05-03 DIAGNOSIS — F4310 Post-traumatic stress disorder, unspecified: Secondary | ICD-10-CM | POA: Diagnosis present

## 2013-05-03 DIAGNOSIS — T50995A Adverse effect of other drugs, medicaments and biological substances, initial encounter: Secondary | ICD-10-CM | POA: Diagnosis present

## 2013-05-03 DIAGNOSIS — Z9861 Coronary angioplasty status: Secondary | ICD-10-CM

## 2013-05-03 DIAGNOSIS — H9319 Tinnitus, unspecified ear: Secondary | ICD-10-CM | POA: Diagnosis present

## 2013-05-03 DIAGNOSIS — R4182 Altered mental status, unspecified: Secondary | ICD-10-CM | POA: Diagnosis present

## 2013-05-03 DIAGNOSIS — R279 Unspecified lack of coordination: Secondary | ICD-10-CM | POA: Diagnosis not present

## 2013-05-03 DIAGNOSIS — I10 Essential (primary) hypertension: Secondary | ICD-10-CM | POA: Diagnosis not present

## 2013-05-03 DIAGNOSIS — N400 Enlarged prostate without lower urinary tract symptoms: Secondary | ICD-10-CM | POA: Diagnosis present

## 2013-05-03 DIAGNOSIS — Z950 Presence of cardiac pacemaker: Secondary | ICD-10-CM | POA: Diagnosis not present

## 2013-05-03 DIAGNOSIS — I519 Heart disease, unspecified: Secondary | ICD-10-CM | POA: Diagnosis not present

## 2013-05-03 DIAGNOSIS — G459 Transient cerebral ischemic attack, unspecified: Secondary | ICD-10-CM | POA: Diagnosis present

## 2013-05-03 DIAGNOSIS — Z87891 Personal history of nicotine dependence: Secondary | ICD-10-CM

## 2013-05-03 DIAGNOSIS — Z79899 Other long term (current) drug therapy: Secondary | ICD-10-CM

## 2013-05-03 DIAGNOSIS — R270 Ataxia, unspecified: Secondary | ICD-10-CM | POA: Diagnosis present

## 2013-05-03 DIAGNOSIS — I658 Occlusion and stenosis of other precerebral arteries: Secondary | ICD-10-CM | POA: Diagnosis not present

## 2013-05-03 HISTORY — DX: Post-traumatic stress disorder, unspecified: F43.10

## 2013-05-03 HISTORY — DX: Altered mental status, unspecified: R41.82

## 2013-05-03 LAB — COMPREHENSIVE METABOLIC PANEL
ALT: 32 U/L (ref 0–53)
AST: 22 U/L (ref 0–37)
Albumin: 4 g/dL (ref 3.5–5.2)
Alkaline Phosphatase: 57 U/L (ref 39–117)
BILIRUBIN TOTAL: 0.5 mg/dL (ref 0.3–1.2)
BUN: 12 mg/dL (ref 6–23)
CHLORIDE: 100 meq/L (ref 96–112)
CO2: 29 meq/L (ref 19–32)
CREATININE: 0.84 mg/dL (ref 0.50–1.35)
Calcium: 9.3 mg/dL (ref 8.4–10.5)
GFR calc Af Amer: >90 mL/min (ref >90)
GFR, EST NON AFRICAN AMERICAN: 86 mL/min — AB (ref >90)
Glucose, Bld: 76 mg/dL (ref 70–99)
Potassium: 4.3 mEq/L (ref 3.7–5.3)
Sodium: 140 mEq/L (ref 137–147)
Total Protein: 6.9 g/dL (ref 6.0–8.3)

## 2013-05-03 LAB — CBC WITH DIFFERENTIAL/PLATELET
Basophils Absolute: 0 10*3/uL (ref 0.0–0.1)
Basophils Relative: 0 % (ref 0–1)
Eosinophils Absolute: 0.1 10*3/uL (ref 0.0–0.7)
Eosinophils Relative: 1 % (ref 0–5)
HEMATOCRIT: 44.1 % (ref 39.0–52.0)
HEMOGLOBIN: 15 g/dL (ref 13.0–17.0)
LYMPHS PCT: 25 % (ref 12–46)
Lymphs Abs: 2.1 10*3/uL (ref 0.7–4.0)
MCH: 32.3 pg (ref 26.0–34.0)
MCHC: 34 g/dL (ref 30.0–36.0)
MCV: 94.8 fL (ref 78.0–100.0)
MONO ABS: 0.9 10*3/uL (ref 0.1–1.0)
MONOS PCT: 11 % (ref 3–12)
NEUTROS ABS: 5.3 10*3/uL (ref 1.7–7.7)
Neutrophils Relative %: 63 % (ref 43–77)
Platelets: 121 10*3/uL — ABNORMAL LOW (ref 150–400)
RBC: 4.65 MIL/uL (ref 4.22–5.81)
RDW: 14.1 % (ref 11.5–15.5)
WBC: 8.4 10*3/uL (ref 4.0–10.5)

## 2013-05-03 LAB — RAPID URINE DRUG SCREEN, HOSP PERFORMED
AMPHETAMINES: NOT DETECTED
Barbiturates: NOT DETECTED
Benzodiazepines: POSITIVE — AB
Cocaine: NOT DETECTED
Opiates: NOT DETECTED
Tetrahydrocannabinol: NOT DETECTED

## 2013-05-03 LAB — URINALYSIS, ROUTINE W REFLEX MICROSCOPIC
Bilirubin Urine: NEGATIVE
Glucose, UA: NEGATIVE mg/dL
Hgb urine dipstick: NEGATIVE
KETONES UR: NEGATIVE mg/dL
LEUKOCYTES UA: NEGATIVE
NITRITE: NEGATIVE
PROTEIN: NEGATIVE mg/dL
Specific Gravity, Urine: 1.007 (ref 1.005–1.030)
Urobilinogen, UA: 0.2 mg/dL (ref 0.0–1.0)
pH: 7 (ref 5.0–8.0)

## 2013-05-03 LAB — MAGNESIUM: Magnesium: 2.2 mg/dL (ref 1.5–2.5)

## 2013-05-03 LAB — ETHANOL

## 2013-05-03 LAB — PHOSPHORUS: PHOSPHORUS: 4.4 mg/dL (ref 2.3–4.6)

## 2013-05-03 MED ORDER — SULFASALAZINE 500 MG PO TABS
500.0000 mg | ORAL_TABLET | Freq: Two times a day (BID) | ORAL | Status: DC
  Administered 2013-05-04 – 2013-05-05 (×3): 500 mg via ORAL
  Filled 2013-05-03 (×5): qty 1

## 2013-05-03 MED ORDER — DIAZEPAM 5 MG PO TABS
10.0000 mg | ORAL_TABLET | Freq: Once | ORAL | Status: AC
  Administered 2013-05-03: 10 mg via ORAL
  Filled 2013-05-03: qty 2

## 2013-05-03 MED ORDER — FINASTERIDE 5 MG PO TABS
5.0000 mg | ORAL_TABLET | Freq: Every day | ORAL | Status: DC
  Administered 2013-05-04 – 2013-05-05 (×2): 5 mg via ORAL
  Filled 2013-05-03 (×2): qty 1

## 2013-05-03 MED ORDER — ZOLPIDEM TARTRATE 5 MG PO TABS
5.0000 mg | ORAL_TABLET | Freq: Every day | ORAL | Status: DC
  Administered 2013-05-03 – 2013-05-04 (×2): 5 mg via ORAL
  Filled 2013-05-03 (×2): qty 1

## 2013-05-03 MED ORDER — ASPIRIN 81 MG PO TABS: 81.0000 mg | ORAL_TABLET | Freq: Every day | ORAL | Status: DC

## 2013-05-03 MED ORDER — POLYETHYLENE GLYCOL 3350 17 G PO PACK
17.0000 g | PACK | Freq: Every day | ORAL | Status: DC | PRN
  Filled 2013-05-03: qty 1

## 2013-05-03 MED ORDER — DIAZEPAM 5 MG PO TABS
10.0000 mg | ORAL_TABLET | Freq: Two times a day (BID) | ORAL | Status: DC
  Administered 2013-05-04 – 2013-05-05 (×2): 10 mg via ORAL
  Filled 2013-05-03 (×2): qty 2

## 2013-05-03 MED ORDER — DIAZEPAM 2 MG PO TABS
2.0000 mg | ORAL_TABLET | Freq: Once | ORAL | Status: AC
  Administered 2013-05-03: 2 mg via ORAL
  Filled 2013-05-03: qty 1

## 2013-05-03 MED ORDER — BENAZEPRIL HCL 20 MG PO TABS
20.0000 mg | ORAL_TABLET | Freq: Every day | ORAL | Status: DC
  Administered 2013-05-03 – 2013-05-05 (×3): 20 mg via ORAL
  Filled 2013-05-03 (×3): qty 1

## 2013-05-03 MED ORDER — AMLODIPINE BESYLATE 5 MG PO TABS
5.0000 mg | ORAL_TABLET | Freq: Every day | ORAL | Status: DC
  Administered 2013-05-04 – 2013-05-05 (×2): 5 mg via ORAL
  Filled 2013-05-03 (×2): qty 1

## 2013-05-03 MED ORDER — ASPIRIN 81 MG PO CHEW
81.0000 mg | CHEWABLE_TABLET | Freq: Every day | ORAL | Status: DC
  Administered 2013-05-03: 81 mg via ORAL
  Filled 2013-05-03 (×2): qty 1

## 2013-05-03 MED ORDER — ONDANSETRON HCL 4 MG PO TABS: 4.0000 mg | ORAL_TABLET | Freq: Four times a day (QID) | ORAL | Status: DC | PRN

## 2013-05-03 MED ORDER — ASPIRIN EC 81 MG PO TBEC
81.0000 mg | DELAYED_RELEASE_TABLET | Freq: Every day | ORAL | Status: DC
  Administered 2013-05-04 – 2013-05-05 (×2): 81 mg via ORAL
  Filled 2013-05-03 (×2): qty 1

## 2013-05-03 MED ORDER — GABAPENTIN 800 MG PO TABS
800.0000 mg | ORAL_TABLET | Freq: Every day | ORAL | Status: DC
  Filled 2013-05-03: qty 1

## 2013-05-03 MED ORDER — TRAZODONE HCL 150 MG PO TABS
150.0000 mg | ORAL_TABLET | Freq: Every day | ORAL | Status: DC
  Administered 2013-05-03 – 2013-05-04 (×2): 150 mg via ORAL
  Filled 2013-05-03 (×3): qty 1

## 2013-05-03 MED ORDER — ATENOLOL 25 MG PO TABS
25.0000 mg | ORAL_TABLET | Freq: Every day | ORAL | Status: DC
  Administered 2013-05-03 – 2013-05-05 (×3): 25 mg via ORAL
  Filled 2013-05-03 (×3): qty 1

## 2013-05-03 MED ORDER — ONDANSETRON HCL 4 MG/2ML IJ SOLN: 4.0000 mg | Freq: Three times a day (TID) | INTRAMUSCULAR | Status: DC | PRN

## 2013-05-03 MED ORDER — TAMSULOSIN HCL 0.4 MG PO CAPS
0.4000 mg | ORAL_CAPSULE | Freq: Every day | ORAL | Status: DC
  Administered 2013-05-04 – 2013-05-05 (×2): 0.4 mg via ORAL
  Filled 2013-05-03 (×2): qty 1

## 2013-05-03 MED ORDER — ACETAMINOPHEN 650 MG RE SUPP: 650.0000 mg | Freq: Four times a day (QID) | RECTAL | Status: DC | PRN

## 2013-05-03 MED ORDER — GABAPENTIN 400 MG PO CAPS
800.0000 mg | ORAL_CAPSULE | Freq: Every day | ORAL | Status: DC
  Administered 2013-05-03 – 2013-05-04 (×2): 800 mg via ORAL
  Filled 2013-05-03 (×3): qty 2

## 2013-05-03 MED ORDER — PANTOPRAZOLE SODIUM 40 MG PO TBEC
40.0000 mg | DELAYED_RELEASE_TABLET | Freq: Every day | ORAL | Status: DC
  Administered 2013-05-04 – 2013-05-05 (×2): 40 mg via ORAL
  Filled 2013-05-03 (×2): qty 1

## 2013-05-03 MED ORDER — ONDANSETRON HCL 4 MG/2ML IJ SOLN: 4.0000 mg | Freq: Four times a day (QID) | INTRAMUSCULAR | Status: DC | PRN

## 2013-05-03 MED ORDER — AMLODIPINE BESYLATE 2.5 MG PO TABS
2.5000 mg | ORAL_TABLET | Freq: Every day | ORAL | Status: DC
  Administered 2013-05-03: 2.5 mg via ORAL
  Filled 2013-05-03: qty 1

## 2013-05-03 MED ORDER — VENLAFAXINE HCL ER 150 MG PO CP24
150.0000 mg | ORAL_CAPSULE | Freq: Every day | ORAL | Status: DC
  Administered 2013-05-04 – 2013-05-05 (×2): 150 mg via ORAL
  Filled 2013-05-03 (×2): qty 1

## 2013-05-03 MED ORDER — SODIUM CHLORIDE 0.9 % IJ SOLN: 3.0000 mL | Freq: Two times a day (BID) | INTRAMUSCULAR | Status: DC

## 2013-05-03 MED ORDER — ACETAMINOPHEN 325 MG PO TABS: 650.0000 mg | ORAL_TABLET | Freq: Four times a day (QID) | ORAL | Status: DC | PRN

## 2013-05-03 MED ORDER — SODIUM CHLORIDE 0.9 % IJ SOLN
3.0000 mL | Freq: Two times a day (BID) | INTRAMUSCULAR | Status: DC
  Administered 2013-05-03 – 2013-05-04 (×2): 3 mL via INTRAVENOUS

## 2013-05-03 MED ORDER — MIRABEGRON ER 25 MG PO TB24
25.0000 mg | ORAL_TABLET | Freq: Every day | ORAL | Status: DC
  Administered 2013-05-04 – 2013-05-05 (×2): 25 mg via ORAL
  Filled 2013-05-03 (×2): qty 1

## 2013-05-03 MED ORDER — ATORVASTATIN CALCIUM 80 MG PO TABS
80.0000 mg | ORAL_TABLET | Freq: Every day | ORAL | Status: DC
  Administered 2013-05-04: 80 mg via ORAL
  Filled 2013-05-03 (×2): qty 1

## 2013-05-03 NOTE — ED Provider Notes (Signed)
CSN: [id number]     Arrival date & time 05/03/13  1108 History   First MD Initiated Contact with Patient 05/03/13 1203     Chief Complaint  Patient presents with  . Altered Mental Status     (Consider location/radiation/quality/duration/timing/severity/associated sxs/prior Treatment) HPI Comments: Pt is a 73 y.o. male with Pmhx as above who presents with about 5 days of ataxia, and several episodes of confusion, erratic driving. He denies CP, SOB, new focal numbness, change in hearing, focal weakness, though says he wife is telling him he is dragging his leg when he walks. He has chronic tinnitus due to [employer] injury.   Patient is a 73 y.o. male presenting with neurologic complaint. The history is provided by the patient. No language interpreter was used.  Neurologic Problem This is a new problem. The current episode started more than 2 days ago (5 days of ataxia, intermittent confusion, two episodes of driving irratically). Episode frequency: ataxia is constant, confusion is intermittent. Progression since onset: waxing & waning. Pertinent negatives include no chest pain, no abdominal pain, no headaches and no shortness of breath. Nothing aggravates the symptoms. Nothing relieves the symptoms. He has tried nothing for the symptoms. The treatment provided no relief.    Past Medical History  Diagnosis Date  . Pacemaker   . Atrial fibrillation   . Anxiety   . Depression   . Hypertension   . Hypercholesteremia   . BPH (benign prostatic hyperplasia)   . Crohn disease    Past Surgical History  Procedure Laterality Date  . Coronary stent placement     No family history on file. History  Substance Use Topics  . Smoking status: Former Research scientist (life sciences)  . Smokeless tobacco: Not on file  . Alcohol Use: Yes     Comment: Ocassionally    Review of Systems  Constitutional: Negative for fever, activity change, appetite change and fatigue.  HENT: Negative for congestion, facial swelling,  rhinorrhea and trouble swallowing.   Eyes: Negative for photophobia and pain.  Respiratory: Negative for cough, chest tightness and shortness of breath.   Cardiovascular: Negative for chest pain and leg swelling.  Gastrointestinal: Negative for nausea, vomiting, abdominal pain, diarrhea and constipation.  Endocrine: Negative for polydipsia and polyuria.  Genitourinary: Negative for dysuria, urgency, decreased urine volume and difficulty urinating.  Musculoskeletal: Negative for back pain and gait problem.  Skin: Negative for color change, rash and wound.  Allergic/Immunologic: Negative for immunocompromised state.  Neurological: Negative for dizziness, facial asymmetry, speech difficulty, weakness, numbness and headaches.       Feeling off balance when ambulating  Psychiatric/Behavioral: Positive for confusion. Negative for decreased concentration and agitation.      Allergies  Review of patient's allergies indicates no known allergies.  Home Medications   Current Outpatient Rx  Name  Route  Sig  Dispense  Refill  . amLODipine (NORVASC) 10 MG tablet   Oral   Take 2.5 mg by mouth daily.          Marland Kitchen aspirin 81 MG tablet   Oral   Take 81 mg by mouth daily.         Marland Kitchen atenolol (TENORMIN) 25 MG tablet   Oral   Take 1 tablet (25 mg total) by mouth daily.   90 tablet   3   . benazepril (LOTENSIN) 20 MG tablet   Oral   Take 1 tablet (20 mg total) by mouth daily.   90 tablet   3   . cyclobenzaprine (  FLEXERIL) 10 MG tablet   Oral   Take 10 mg by mouth 3 (three) times daily as needed for muscle spasms.         . diazepam (VALIUM) 10 MG tablet   Oral   Take 10 mg by mouth 2 (two) times daily as needed for anxiety.          . finasteride (PROSCAR) 5 MG tablet   Oral   Take 5 mg by mouth daily.         . fish oil-omega-3 fatty acids 1000 MG capsule   Oral   Take 1 g by mouth daily.          Marland Kitchen gabapentin (NEURONTIN) 800 MG tablet   Oral   Take 800 mg by mouth  at bedtime.          . mirabegron ER (MYRBETRIQ) 25 MG TB24 tablet   Oral   Take 25 mg by mouth daily.         Marland Kitchen omeprazole (PRILOSEC) 20 MG capsule   Oral   Take 20 mg by mouth daily.         . rosuvastatin (CRESTOR) 40 MG tablet   Oral   Take 40 mg by mouth daily.         Marland Kitchen sulfaSALAzine (AZULFIDINE) 500 MG tablet   Oral   Take 500 mg by mouth 2 (two) times daily.          . tamsulosin (FLOMAX) 0.4 MG CAPS capsule   Oral   Take 0.4 mg by mouth.         . traZODone (DESYREL) 100 MG tablet   Oral   Take 150 mg by mouth at bedtime.          Marland Kitchen venlafaxine XR (EFFEXOR-XR) 150 MG 24 hr capsule   Oral   Take 150 mg by mouth daily.         Marland Kitchen zolpidem (AMBIEN) 10 MG tablet   Oral   Take 10 mg by mouth at bedtime.          . colestipol (COLESTID) 1 G tablet   Oral   Take 1 g by mouth as needed (Crohns Flare Up).           BP 164/102  Pulse 66  Temp(Src) 98.2 F (36.8 C)  Resp 12  SpO2 95% Physical Exam  Constitutional: He is oriented to person, place, and time. He appears well-developed and well-nourished. No distress.  HENT:  Head: Normocephalic and atraumatic.  Mouth/Throat: No oropharyngeal exudate.  Eyes: Pupils are equal, round, and reactive to light.  Neck: Normal range of motion. Neck supple.  Cardiovascular: Normal rate, regular rhythm and normal heart sounds.  Exam reveals no gallop and no friction rub.   No murmur heard. Pulmonary/Chest: Effort normal and breath sounds normal. No respiratory distress. He has no wheezes. He has no rales.  Abdominal: Soft. Bowel sounds are normal. He exhibits no distension and no mass. There is no tenderness. There is no rebound and no guarding.  Musculoskeletal: Normal range of motion. He exhibits no edema and no tenderness.  Neurological: He is alert and oriented to person, place, and time. He has normal strength. He displays no atrophy and no tremor. No cranial nerve deficit or sensory deficit. He  exhibits normal muscle tone. Coordination and gait abnormal. GCS eye subscore is 4. GCS verbal subscore is 5. GCS motor subscore is 6.  Dysmetria LUE, +romberg, ataxia  Skin: Skin is warm  and dry.  Psychiatric: He has a normal mood and affect.    ED Course  Procedures (including critical care time) Labs Review Labs Reviewed  CBC WITH DIFFERENTIAL - Abnormal; Notable for the following:    Platelets 121 (*)    All other components within normal limits  COMPREHENSIVE METABOLIC PANEL - Abnormal; Notable for the following:    GFR calc non Af Amer 86 (*)    All other components within normal limits  URINE RAPID DRUG SCREEN (HOSP PERFORMED) - Abnormal; Notable for the following:    Benzodiazepines POSITIVE (*)    All other components within normal limits  URINE CULTURE  ETHANOL  URINALYSIS, ROUTINE W REFLEX MICROSCOPIC   Imaging Review Ct Head Wo Contrast  05/03/2013   CLINICAL DATA:  Altered mental status  EXAM: CT HEAD WITHOUT CONTRAST  TECHNIQUE: Contiguous axial images were obtained from the base of the skull through the vertex without intravenous contrast.  COMPARISON:  CT 06/13/2011  FINDINGS: Mild atrophy, unchanged. Mild chronic microvascular ischemic change in the white matter.  Negative for acute infarct. Negative for hemorrhage or mass. No midline shift. No acute bony abnormality.  No acute bony abnormality.  IMPRESSION: Atrophy and mild chronic microvascular ischemia. No acute abnormality.   Electronically Signed   By: Franchot Gallo M.D.   On: 05/03/2013 13:33     EKG Interpretation   Date/Time:  Monday May 03 2013 11:53:30 EDT Ventricular Rate:  75 PR Interval:  136 QRS Duration: 108 QT Interval:  437 QTC Calculation: 488 R Axis:   -9 Text Interpretation:  Sinus rhythm Atrial premature complex Probable left  atrial enlargement Abnormal R-wave progression, early transition Repol  abnrm, global ischemia, diffuse leads Poor data quality Confirmed by  Canon Gola  MD, Tawana Pasch  [phone]) on 05/03/2013 12:46:06 PM      MDM   Final diagnoses:  Ataxia  Altered mental state    Pt is a 73 y.o. male with Pmhx as above who presents with about 5 days of ataxia, and several episodes of confusion, erratic driving. He denies CP, SOB, new focal numbness, change in hearing, focal weakness, though says he wife is telling him he is dragging his leg when he walks. He has chronic tinnitus due to [employer] injury. On PE, Pt hypertensive, but is in NAD. Cardiopulm exam benign. He has dysmetria of L hand, +romberg, +ataxia. CT head w/o acute findings. No acute labs findings to explain symptoms. Given suspected CVA or unseen mass lesion, GMA consulted for admission.  Also spoke w/ Dr. Aram Beecham with neurology who will see pt after transfer to [facility].           Neta Ehlers, MD 05/03/13 2130
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
## 2013-05-03 NOTE — H&P (Addendum)
Kevin Wiley is an 73 y.o. male.   Chief Complaint: disorientation.  Imbalance. HPI: Kevin Wiley is a pleasant gentleman with multiple medical issues.   He reports two weeks of symptoms.  He has increased trouble getting up from a seated position. He has to rock to get up and then once he gets up he almost falls (in fact he did fall 3-4 times).  He has never been like that before.   He seemed disoriented to agree.  He reports having some word finding problems.  He was noted to be driving in the wrong direction in traffic.   Police were called.  He presents to the ER where is a bit anxious, bp is elevated and he  Has dysmetria of the left upper extremity.  He has a pacemaker so we cannot get an mri but cct is negative. He will require admission for suspected subacute stroke.  Past Medical History  Diagnosis Date  . Pacemaker   . Atrial fibrillation   . Anxiety   . Depression   . Hypertension   . Hypercholesteremia   . BPH (benign prostatic hyperplasia)   . Crohn disease     Past Surgical History  Procedure Laterality Date  . Coronary stent placement    . Renal stents      x 2  . Insert / replace / remove pacemaker      History reviewed. No pertinent family history. Social History:  reports that he quit smoking about 41 years ago. He has never used smokeless tobacco. He reports that he drinks alcohol (2 beers two days a week). He reports that he does not use illicit drugs.  [place] veteran.  Allergies: No Known Allergies    Results for orders placed during the hospital encounter of 05/03/13 (from the past 48 hour(s))  CBC WITH DIFFERENTIAL     Status: Abnormal   Collection Time    05/03/13 11:46 AM      Result Value Ref Range   WBC 8.4  4.0 - 10.5 K/uL   RBC 4.65  4.22 - 5.81 MIL/uL   Hemoglobin 15.0  13.0 - 17.0 g/dL   HCT 44.1  39.0 - 52.0 %   MCV 94.8  78.0 - 100.0 fL   MCH 32.3  26.0 - 34.0 pg   MCHC 34.0  30.0 - 36.0 g/dL   RDW 14.1  11.5 - 15.5 %   Platelets 121 (*) 150 -  400 K/uL   Neutrophils Relative % 63  43 - 77 %   Neutro Abs 5.3  1.7 - 7.7 K/uL   Lymphocytes Relative 25  12 - 46 %   Lymphs Abs 2.1  0.7 - 4.0 K/uL   Monocytes Relative 11  3 - 12 %   Monocytes Absolute 0.9  0.1 - 1.0 K/uL   Eosinophils Relative 1  0 - 5 %   Eosinophils Absolute 0.1  0.0 - 0.7 K/uL   Basophils Relative 0  0 - 1 %   Basophils Absolute 0.0  0.0 - 0.1 K/uL  COMPREHENSIVE METABOLIC PANEL     Status: Abnormal   Collection Time    05/03/13 11:46 AM      Result Value Ref Range   Sodium 140  137 - 147 mEq/L   Potassium 4.3  3.7 - 5.3 mEq/L   Chloride 100  96 - 112 mEq/L   CO2 29  19 - 32 mEq/L   Glucose, Bld 76  70 - 99 mg/dL  BUN 12  6 - 23 mg/dL   Creatinine, Ser 0.84  0.50 - 1.35 mg/dL   Calcium 9.3  8.4 - 10.5 mg/dL   Total Protein 6.9  6.0 - 8.3 g/dL   Albumin 4.0  3.5 - 5.2 g/dL   AST 22  0 - 37 U/L   ALT 32  0 - 53 U/L   Alkaline Phosphatase 57  39 - 117 U/L   Total Bilirubin 0.5  0.3 - 1.2 mg/dL   GFR calc non Af Amer 86 (*) >90 mL/min   GFR calc Af Amer >90  >90 mL/min   Comment: (NOTE)     The eGFR has been calculated using the CKD EPI equation.     This calculation has not been validated in all clinical situations.     eGFR's persistently <90 mL/min signify possible Chronic Kidney     Disease.  ETHANOL     Status: None   Collection Time    05/03/13 11:46 AM      Result Value Ref Range   Alcohol, Ethyl (B) <11  0 - 11 mg/dL   Comment:            LOWEST DETECTABLE LIMIT FOR     SERUM ALCOHOL IS 11 mg/dL     FOR MEDICAL PURPOSES ONLY  URINALYSIS, ROUTINE W REFLEX MICROSCOPIC     Status: None   Collection Time    05/03/13  1:27 PM      Result Value Ref Range   Color, Urine YELLOW  YELLOW   APPearance CLEAR  CLEAR   Specific Gravity, Urine 1.007  1.005 - 1.030   pH 7.0  5.0 - 8.0   Glucose, UA NEGATIVE  NEGATIVE mg/dL   Hgb urine dipstick NEGATIVE  NEGATIVE   Bilirubin Urine NEGATIVE  NEGATIVE   Ketones, ur NEGATIVE  NEGATIVE mg/dL    Protein, ur NEGATIVE  NEGATIVE mg/dL   Urobilinogen, UA 0.2  0.0 - 1.0 mg/dL   Nitrite NEGATIVE  NEGATIVE   Leukocytes, UA NEGATIVE  NEGATIVE   Comment: MICROSCOPIC NOT DONE ON URINES WITH NEGATIVE PROTEIN, BLOOD, LEUKOCYTES, NITRITE, OR GLUCOSE <1000 mg/dL.  URINE RAPID DRUG SCREEN (HOSP PERFORMED)     Status: Abnormal   Collection Time    05/03/13  1:27 PM      Result Value Ref Range   Opiates NONE DETECTED  NONE DETECTED   Cocaine NONE DETECTED  NONE DETECTED   Benzodiazepines POSITIVE (*) NONE DETECTED   Amphetamines NONE DETECTED  NONE DETECTED   Tetrahydrocannabinol NONE DETECTED  NONE DETECTED   Barbiturates NONE DETECTED  NONE DETECTED   Comment:            DRUG SCREEN FOR MEDICAL PURPOSES     ONLY.  IF CONFIRMATION IS NEEDED     FOR ANY PURPOSE, NOTIFY LAB     WITHIN 5 DAYS.                LOWEST DETECTABLE LIMITS     FOR URINE DRUG SCREEN     Drug Class       Cutoff (ng/mL)     Amphetamine      1000     Barbiturate      200     Benzodiazepine   482     Tricyclics       707     Opiates          300     Cocaine  300     THC              50   Ct Head Wo Contrast  05/03/2013   CLINICAL DATA:  Altered mental status  EXAM: CT HEAD WITHOUT CONTRAST  TECHNIQUE: Contiguous axial images were obtained from the base of the skull through the vertex without intravenous contrast.  COMPARISON:  CT 06/13/2011  FINDINGS: Mild atrophy, unchanged. Mild chronic microvascular ischemic change in the white matter.  Negative for acute infarct. Negative for hemorrhage or mass. No midline shift. No acute bony abnormality.  No acute bony abnormality.  IMPRESSION: Atrophy and mild chronic microvascular ischemia. No acute abnormality.   Electronically Signed   By: Franchot Gallo M.D.   On: 05/03/2013 13:33    ROS:no fever, some chest pressure today.  Blood pressure 170/132, pulse 68, temperature 98.2 F (36.8 C), resp. rate 18, SpO2 100.00%.  anxious male standing next to the stretcher.    alert oriented to place and situation. he has clear lungs bilaterally.  Heart is rrr with no m/r/g. (but it may be paced).  abd soft, nt, nd, no mass or hsm.  Moe times four, grossly nL strength. No pronator drift but he has trouble controlling his left hand on finger nose finger testing.   2+ dtrs throughout.  Home meds:  81 mg asa myrbetriq 25 mg on daily tamsulosin 0.4 mg daily Finasteride 5 mg daily Amlodipine 36m 1/2 pill daily Diazepam  5 mg po bid Fish oil  1000 mg daily Gabapentin 800 mg one po qhs Omeprazole 20 mg daily crestor 20 mg daily Sulfasalazine 500 mg daily Trazodone 100 mg 1 and 1/2 at hs Venlafaxine xr 150 mg qam ambien 10 mg qhs Atenolol 25 mg daily Benazepril 20 mg daily  ecg shows nsr with atrial pacing.  Assessment/Plan 73yo male with disorientation and left upper extremity reduction in motor control.  He may have underlying afib and he has been on aspirin only for antiplatelet therapy (however his ecg does not really confirm this).  I am concerned that he has had some subacute embolic events in his brain; alternatively this could all be confusion related to mood, not sleeping well and this could have caused his confusion.   He is rather anxious and hypertension (improving with a dose of 10 mg valium now).   We will admit to neurology floor/tele bed and we will try to get bp under control . We need neurology consult.  He may benefit from the addition of some other medicine to help mood or sleep like seroquel (but of course those drugs can increase stroke risk).   I will defer any change in blood thinners to neurology.   He was placed on the gabapentin for [place] issues [place]  He says he has never had another flashback since he was placed on the gabapentin in 2003. However, i do wonder if this dose should be reduced as it could be contributing to confusion (also, his ambien dose likely should be reduced).    PJerlyn Ly MD 05/03/2013, 7:11 PM

## 2013-05-03 NOTE — ED Notes (Signed)
Pt aware a urine sample is needed. Pt states he has prostate problems and is currently unable to urinate and that it might be a few hours before he can.  RN notified.  Will check in with pt.

## 2013-05-03 NOTE — Progress Notes (Signed)
Pt arrived to 4N10 in good spirits. He is AAOx4. Vitals were taken and telemetry was placed. He is oriented to the room and the call bell is by his side. Will continue to monitor. Helaman Mecca, Rande Brunt

## 2013-05-04 ENCOUNTER — Encounter (HOSPITAL_COMMUNITY): Payer: Self-pay | Admitting: Radiology

## 2013-05-04 ENCOUNTER — Inpatient Hospital Stay (HOSPITAL_COMMUNITY): Payer: Medicare Other

## 2013-05-04 DIAGNOSIS — R4182 Altered mental status, unspecified: Secondary | ICD-10-CM

## 2013-05-04 DIAGNOSIS — I519 Heart disease, unspecified: Secondary | ICD-10-CM

## 2013-05-04 DIAGNOSIS — R279 Unspecified lack of coordination: Secondary | ICD-10-CM | POA: Diagnosis not present

## 2013-05-04 DIAGNOSIS — I658 Occlusion and stenosis of other precerebral arteries: Secondary | ICD-10-CM | POA: Diagnosis not present

## 2013-05-04 DIAGNOSIS — R269 Unspecified abnormalities of gait and mobility: Secondary | ICD-10-CM | POA: Diagnosis not present

## 2013-05-04 LAB — COMPREHENSIVE METABOLIC PANEL
ALT: 28 U/L (ref 0–53)
AST: 18 U/L (ref 0–37)
Albumin: 3.2 g/dL — ABNORMAL LOW (ref 3.5–5.2)
Alkaline Phosphatase: 46 U/L (ref 39–117)
BILIRUBIN TOTAL: 0.4 mg/dL (ref 0.3–1.2)
BUN: 17 mg/dL (ref 6–23)
CALCIUM: 9.1 mg/dL (ref 8.4–10.5)
CO2: 25 meq/L (ref 19–32)
CREATININE: 0.84 mg/dL (ref 0.50–1.35)
Chloride: 101 mEq/L (ref 96–112)
GFR, EST NON AFRICAN AMERICAN: 86 mL/min — AB (ref >90)
GLUCOSE: 90 mg/dL (ref 70–99)
Potassium: 4.4 mEq/L (ref 3.7–5.3)
Sodium: 139 mEq/L (ref 137–147)
Total Protein: 5.7 g/dL — ABNORMAL LOW (ref 6.0–8.3)

## 2013-05-04 LAB — CBC WITH DIFFERENTIAL/PLATELET
BASOS ABS: 0 10*3/uL (ref 0.0–0.1)
Basophils Relative: 0 % (ref 0–1)
EOS PCT: 1 % (ref 0–5)
Eosinophils Absolute: 0.1 10*3/uL (ref 0.0–0.7)
HCT: 40.3 % (ref 39.0–52.0)
Hemoglobin: 13.6 g/dL (ref 13.0–17.0)
LYMPHS PCT: 29 % (ref 12–46)
Lymphs Abs: 2.3 10*3/uL (ref 0.7–4.0)
MCH: 32.3 pg (ref 26.0–34.0)
MCHC: 33.7 g/dL (ref 30.0–36.0)
MCV: 95.7 fL (ref 78.0–100.0)
Monocytes Absolute: 0.7 10*3/uL (ref 0.1–1.0)
Monocytes Relative: 9 % (ref 3–12)
NEUTROS ABS: 4.7 10*3/uL (ref 1.7–7.7)
Neutrophils Relative %: 61 % (ref 43–77)
PLATELETS: 134 10*3/uL — AB (ref 150–400)
RBC: 4.21 MIL/uL — ABNORMAL LOW (ref 4.22–5.81)
RDW: 14.5 % (ref 11.5–15.5)
WBC: 7.8 10*3/uL (ref 4.0–10.5)

## 2013-05-04 LAB — TSH: TSH: 2.05 u[IU]/mL

## 2013-05-04 LAB — VITAMIN B12: Vitamin B-12: 547 pg/mL (ref 211–911)

## 2013-05-04 MED ORDER — IOHEXOL 350 MG/ML SOLN
50.0000 mL | Freq: Once | INTRAVENOUS | Status: AC | PRN
  Administered 2013-05-04: 50 mL via INTRAVENOUS

## 2013-05-04 MED ORDER — ENOXAPARIN SODIUM 40 MG/0.4ML ~~LOC~~ SOLN
40.0000 mg | SUBCUTANEOUS | Status: DC
Start: 2013-05-04 — End: 2013-05-05
  Administered 2013-05-04 – 2013-05-05 (×2): 40 mg via SUBCUTANEOUS
  Filled 2013-05-04 (×3): qty 0.4

## 2013-05-04 NOTE — Progress Notes (Signed)
Physician Daily Progress Note  Subjective: Had trouble sleeping last night He is feeling good    Objective: Vital signs in last 24 hours: Temp:  [97 F (36.1 C)-98.7 F (37.1 C)] 97 F (36.1 C) (03/31 0505) Pulse Rate:  [63-89] 64 (03/31 0505) Resp:  [12-20] 18 (03/31 0505) BP: (96-208)/(58-166) 101/69 mmHg (03/31 0505) SpO2:  [95 %-100 %] 97 % (03/31 0505) Weight:  [89.313 kg (196 lb 14.4 oz)-89.4 kg (197 lb 1.5 oz)] 89.313 kg (196 lb 14.4 oz) (03/31 0500) Weight change:  Last BM Date: 05/02/13  CBG (last 3)  No results found for this basename: GLUCAP,  in the last 72 hours  Intake/Output from previous day:  Intake/Output Summary (Last 24 hours) at 05/04/13 0719 Last data filed at 05/03/13 1457  Gross per 24 hour  Intake    240 ml  Output      0 ml  Net    240 ml   03/30 0701 - 03/31 0700 In: 240 [P.O.:240] Out: -   Physical Exam General appearance: WM in NAD  Eyes: no scleral icterus Throat: oropharynx moist without erythema Resp: CTAB, no wheezing, rales  Cardio: RRR, no MRG  GI: soft, non-tender; bowel sounds normal; no masses,  no organomegaly Extremities: no clubbing, cyanosis or edema   Lab Results:  Recent Labs  05/03/13 1146 05/03/13 2235  NA 140  --   K 4.3  --   CL 100  --   CO2 29  --   GLUCOSE 76  --   BUN 12  --   CREATININE 0.84  --   CALCIUM 9.3  --   MG  --  2.2  PHOS  --  4.4     Recent Labs  05/03/13 1146  AST 22  ALT 32  ALKPHOS 57  BILITOT 0.5  PROT 6.9  ALBUMIN 4.0     Recent Labs  05/03/13 1146  WBC 8.4  NEUTROABS 5.3  HGB 15.0  HCT 44.1  MCV 94.8  PLT 121*    No results found for this basename: INR, PROTIME    No results found for this basename: CKTOTAL, CKMB, CKMBINDEX, TROPONINI,  in the last 72 hours  No results found for this basename: TSH, T4TOTAL, FREET3, T3FREE, THYROIDAB,  in the last 72 hours  No results found for this basename: VITAMINB12, FOLATE, FERRITIN, TIBC, IRON, RETICCTPCT,  in the  last 72 hours  Micro Results: No results found for this or any previous visit (from the past 240 hour(s)).  Studies/Results: Ct Head Wo Contrast  05/03/2013   CLINICAL DATA:  Altered mental status  EXAM: CT HEAD WITHOUT CONTRAST  TECHNIQUE: Contiguous axial images were obtained from the base of the skull through the vertex without intravenous contrast.  COMPARISON:  CT 06/13/2011  FINDINGS: Mild atrophy, unchanged. Mild chronic microvascular ischemic change in the white matter.  Negative for acute infarct. Negative for hemorrhage or mass. No midline shift. No acute bony abnormality.  No acute bony abnormality.  IMPRESSION: Atrophy and mild chronic microvascular ischemia. No acute abnormality.   Electronically Signed   By: Franchot Gallo M.D.   On: 05/03/2013 13:33     Medications: Scheduled: . amLODipine  5 mg Oral Daily  . aspirin  81 mg Oral Daily  . aspirin EC  81 mg Oral Daily  . atenolol  25 mg Oral Daily  . atorvastatin  80 mg Oral q1800  . benazepril  20 mg Oral Daily  . diazepam  10  mg Oral BID PC  . finasteride  5 mg Oral Daily  . gabapentin  800 mg Oral QHS  . mirabegron ER  25 mg Oral Daily  . pantoprazole  40 mg Oral Daily  . sodium chloride  3 mL Intravenous Q12H  . sulfaSALAzine  500 mg Oral BID  . tamsulosin  0.4 mg Oral Daily  . traZODone  150 mg Oral QHS  . venlafaxine XR  150 mg Oral Daily  . zolpidem  5 mg Oral QHS   Continuous:   Assessment/Plan:   AMS/ ataxic gait - appreciate neuro recs. Ordering A1C, lipid panel for AM. F/u TTE, CTA, EEG. PT/OT c/s placed. He has had no recent medication changes to explain event, however is on several sedating meds for over a decade . Does have hx of PTSD . On Tele.   Dispo - pending above w/u   DVT Prophylaxis   LOS: 1 day   Tamikka Pilger 05/04/2013, 7:19 AM

## 2013-05-04 NOTE — Evaluation (Addendum)
Occupational Therapy Evaluation Patient Details Name: Kevin Wiley MRN: [id number] DOB: [birth year] Today's Date: 05/04/2013    History of Present Illness  73 y.o. Admitted with confusion and gait instability.    Clinical Impression   Pt moving well. Feel pt is safe to d/c home, from OT standpoint-education provided to both pt and spouse during session.     Follow Up Recommendations  No OT follow up    Equipment Recommendations  None recommended by OT    Recommendations for Other Services       Precautions / Restrictions Restrictions Weight Bearing Restrictions: No      Mobility Bed Mobility Overal bed mobility: Modified Independent                Transfers Overall transfer level: Modified independent Equipment used: None Transfers: Sit to/from Stand Sit to Stand: Modified independent (Device/Increase time)              Balance                                    ADL         Lower Body Bathing: Moderate assistance (standing-balance)   Toilet Transfer: Ambulation;Regular Toilet; Modified Independent   Tub/ Shower Transfer: Min guard;Ambulation;Supervision/safety (practiced stepping over simulated tub-Min guard/shower transfer-Supervision) Functional mobility during ADLs: Supervision/safety (due to pt saying he felt balance has been off; pt walking fast in hallway) General ADL Comments: Educated on home safety such on being sure rugs are non-skid in house and discussed safe shoewear. Recommended sitting for bathing on chair and sitting for dressing. Pt simulated bathing while standing requiring assistance for balance. Practiced stepping over simulated tub and walk in shower. Talked about not holding onto moving door when getting into shower but rather something stable (not towel rack). Educated on signs/symptoms of stroke and importance of getting help right away and also to avoid canned foods/increased sodium. Told pt to slow down while  ambulating.  Pt gross motor coordination seemed off with LUE, so gave him a few activities he could do at home. Pt able to don/doff socks sitting on bed.     Vision       Tracking/Visual Pursuits:  (decreased smoothness) Saccades: Within functional limits           Perception     Praxis      Pertinent Vitals/Pain No pain reported.       Hand Dominance     Extremity/Trunk Assessment Upper Extremity Assessment Upper Extremity Assessment: LUE deficits/detail;RUE deficits/detail RUE Coordination: decreased gross motor LUE Coordination: decreased gross motor (LUE seemed worse than RUE)           Communication Communication Communication: HOH   Cognition Arousal/Alertness: Awake/alert Behavior During Therapy: WFL for tasks assessed/performed Overall Cognitive Status: Within Functional Limits for tasks assessed (cue for name of hospital then pt able to say it); spouse thinks pt is off compared to baseline.                     General Comments       Exercises      Home Living Family/patient expects to be discharged to:: Private residence Living Arrangements: Spouse/significant other Available Help at Discharge: Family (most of the day) Type of Home: House Home Access: Stairs to enter [employer] of Steps: 1 Entrance Stairs-Rails: None Home Layout: Multi-level Alternate Level Stairs-Number of Steps: 15 Alternate  Level Stairs-Rails: Right;Left (left part of way and then right) Bathroom Shower/Tub: Tub/shower unit;Walk-in shower   Bathroom Toilet: Standard     Home Equipment: Environmental consultant - 2 wheels;Cane - single point;Tub bench;Grab bars - toilet          Prior Functioning/Environment Level of Independence: Independent             OT Diagnosis:     OT Problem List:     OT Treatment/Interventions:      OT Goals(Current goals can be found in the care plan section)    OT Frequency:     Barriers to D/C:            End of  Session: Equipment Utilized During Treatment: Gait belt  Activity Tolerance: Patient tolerated treatment well Patient left: in bed;with nursing/sitter in room;with family/visitor present   Time: 0973-5329 OT Time Calculation (min): 36 min Charges:  OT General Charges $OT Visit: 1 Procedure OT Evaluation $Initial OT Evaluation Tier I: 1 Procedure OT Treatments $Self Care/Home Management : 8-22 mins G-CodesBenito Wiley OTR/L [phone] 05/04/2013, 6:02 PM

## 2013-05-04 NOTE — Progress Notes (Signed)
Echocardiogram 2D Echocardiogram has been performed.  05/04/2013 2:35 PM Maudry Mayhew, RVT, RDCS, RDMS

## 2013-05-04 NOTE — Consult Note (Signed)
Referring Physician: Reynaldo Minium    Chief Complaint: Confusion, gait instability  HPI: Kevin Wiley is an 73 y.o. male who reports that for the past two weeks he has had trouble getting up from a seated position.  He must rock back and forth to get up and then goes forward so fast that he can not stop.  He has been off balance walking and has fallen many times.  He has come close to falling even more.  His driving has been erratic.  Recently the police were called because of his driving.  He reports that he is basically unaware of what is happening at the time.  He does recall though that yesterday while going to the bank he became disoriented and found himself going down a one-way street in the wrong direction.    Date last known well: Unable to determine Time last known well: Unable to determine tPA Given: No: No distinct LKW  Past Medical History  Diagnosis Date  . Pacemaker   . Atrial fibrillation   . Anxiety   . Depression   . Hypertension   . Hypercholesteremia   . BPH (benign prostatic hyperplasia)   . Crohn disease   . PTSD (post-traumatic stress disorder)     Past Surgical History  Procedure Laterality Date  . Coronary stent placement    . Renal stents      x 2  . Insert / replace / remove pacemaker      Family history: Father with CAD  Social History:  reports that he quit smoking about 41 years ago. He has never used smokeless tobacco. He reports that he drinks alcohol. He reports that he does not use illicit drugs.  Allergies: No Known Allergies  Medications:  I have reviewed the patient's current medications. Prior to Admission:  Prescriptions prior to admission  Medication Sig Dispense Refill  . amLODipine (NORVASC) 10 MG tablet Take 2.5 mg by mouth daily.       Marland Kitchen aspirin 81 MG tablet Take 81 mg by mouth daily.      Marland Kitchen atenolol (TENORMIN) 25 MG tablet Take 1 tablet (25 mg total) by mouth daily.  90 tablet  3  . benazepril (LOTENSIN) 20 MG tablet Take 1 tablet  (20 mg total) by mouth daily.  90 tablet  3  . cyclobenzaprine (FLEXERIL) 10 MG tablet Take 10 mg by mouth 3 (three) times daily as needed for muscle spasms.      . diazepam (VALIUM) 10 MG tablet Take 10 mg by mouth 2 (two) times daily as needed for anxiety.       . finasteride (PROSCAR) 5 MG tablet Take 5 mg by mouth daily.      . fish oil-omega-3 fatty acids 1000 MG capsule Take 1 g by mouth daily.       Marland Kitchen gabapentin (NEURONTIN) 800 MG tablet Take 800 mg by mouth at bedtime.       . mirabegron ER (MYRBETRIQ) 25 MG TB24 tablet Take 25 mg by mouth daily.      Marland Kitchen omeprazole (PRILOSEC) 20 MG capsule Take 20 mg by mouth daily.      . rosuvastatin (CRESTOR) 40 MG tablet Take 40 mg by mouth daily.      Marland Kitchen sulfaSALAzine (AZULFIDINE) 500 MG tablet Take 500 mg by mouth 2 (two) times daily.       . tamsulosin (FLOMAX) 0.4 MG CAPS capsule Take 0.4 mg by mouth.      . traZODone (DESYREL) 100  MG tablet Take 150 mg by mouth at bedtime.       Marland Kitchen venlafaxine XR (EFFEXOR-XR) 150 MG 24 hr capsule Take 150 mg by mouth daily.      Marland Kitchen zolpidem (AMBIEN) 10 MG tablet Take 10 mg by mouth at bedtime.       . colestipol (COLESTID) 1 G tablet Take 1 g by mouth as needed (Crohns Flare Up).        Scheduled: . amLODipine  5 mg Oral Daily  . aspirin  81 mg Oral Daily  . aspirin EC  81 mg Oral Daily  . atenolol  25 mg Oral Daily  . atorvastatin  80 mg Oral q1800  . benazepril  20 mg Oral Daily  . diazepam  10 mg Oral BID PC  . finasteride  5 mg Oral Daily  . gabapentin  800 mg Oral QHS  . mirabegron ER  25 mg Oral Daily  . pantoprazole  40 mg Oral Daily  . sodium chloride  3 mL Intravenous Q12H  . sulfaSALAzine  500 mg Oral BID  . tamsulosin  0.4 mg Oral Daily  . traZODone  150 mg Oral QHS  . venlafaxine XR  150 mg Oral Daily  . zolpidem  5 mg Oral QHS    ROS: History obtained from the patient  General ROS: negative for - chills, fatigue, fever, night sweats, weight gain or weight loss Psychological ROS: as  noted in HPI Ophthalmic ROS: negative for - blurry vision, double vision, eye pain or loss of vision ENT ROS: HOH Allergy and Immunology ROS: negative for - hives or itchy/watery eyes Hematological and Lymphatic ROS: negative for - bleeding problems, bruising or swollen lymph nodes Endocrine ROS: negative for - galactorrhea, hair pattern changes, polydipsia/polyuria or temperature intolerance Respiratory ROS: negative for - cough, hemoptysis, shortness of breath or wheezing Cardiovascular ROS: negative for - chest pain, dyspnea on exertion, edema or irregular heartbeat Gastrointestinal ROS: negative for - abdominal pain, diarrhea, hematemesis, nausea/vomiting or stool incontinence Genito-Urinary ROS: negative for - dysuria, hematuria, incontinence or urinary frequency/urgency Musculoskeletal ROS: negative for - joint swelling or muscular weakness Neurological ROS: as noted in HPI Dermatological ROS: negative for rash and skin lesion changes  Physical Examination: Blood pressure 122/88, pulse 65, temperature 98.1 F (36.7 C), temperature source Oral, resp. rate 18, weight 89.4 kg (197 lb 1.5 oz), SpO2 97.00%.  Neurologic Examination: Mental Status: Alert, oriented, thought content appropriate.  Speech fluent without evidence of aphasia.  Able to follow 3 step commands without difficulty. Cranial Nerves: II: Discs flat bilaterally; Visual fields grossly normal, pupils equal, round, reactive to light and accommodation III,IV, VI: ptosis not present, extra-ocular motions intact bilaterally V,VII: smile symmetric, facial light touch sensation normal bilaterally VIII: hearing normal bilaterally IX,X: gag reflex present XI: bilateral shoulder shrug XII: midline tongue extension Motor: Right : Upper extremity   5/5    Left:     Upper extremity   5/5  Lower extremity   5/5     Lower extremity   5/5 Tone and bulk:normal tone throughout; no atrophy noted Sensory: Pinprick and light touch intact  throughout, bilaterally Deep Tendon Reflexes: 2+ throughout with absent AJ's bilaterally Plantars: Right: mute   Left: upgoing Cerebellar: Mild RUE dysmetria and LLE dysmetria Gait: normal gait and station CV: pulses palpable throughout     Laboratory Studies:  Basic Metabolic Panel:  Recent Labs Lab 05/03/13 1146 05/03/13 2235  NA 140  --   K 4.3  --  CL 100  --   CO2 29  --   GLUCOSE 76  --   BUN 12  --   CREATININE 0.84  --   CALCIUM 9.3  --   MG  --  2.2  PHOS  --  4.4    Liver Function Tests:  Recent Labs Lab 05/03/13 1146  AST 22  ALT 32  ALKPHOS 57  BILITOT 0.5  PROT 6.9  ALBUMIN 4.0   No results found for this basename: LIPASE, AMYLASE,  in the last 168 hours No results found for this basename: AMMONIA,  in the last 168 hours  CBC:  Recent Labs Lab 05/03/13 1146  WBC 8.4  NEUTROABS 5.3  HGB 15.0  HCT 44.1  MCV 94.8  PLT 121*    Cardiac Enzymes: No results found for this basename: CKTOTAL, CKMB, CKMBINDEX, TROPONINI,  in the last 168 hours  BNP: No components found with this basename: POCBNP,   CBG: No results found for this basename: GLUCAP,  in the last 168 hours  Microbiology: No results found for this or any previous visit.  Coagulation Studies: No results found for this basename: LABPROT, INR,  in the last 72 hours  Urinalysis:  Recent Labs Lab 05/03/13 [place] 1.007  PHURINE 7.0  [place]  UROBILINOGEN 0.2  NITRITE NEGATIVE  LEUKOCYTESUR NEGATIVE    Lipid Panel: No results found for this basename: chol, trig, hdl, cholhdl, vldl, ldlcalc    HgbA1C:  No results found for this basename: HGBA1C    Urine Drug Screen:     Component Value Date/Time   LABOPIA NONE DETECTED 05/03/2013 1327   COCAINSCRNUR NONE DETECTED 05/03/2013 1327   LABBENZ POSITIVE* 05/03/2013 1327   AMPHETMU NONE DETECTED 05/03/2013  1327   THCU NONE DETECTED 05/03/2013 1327   LABBARB NONE DETECTED 05/03/2013 1327    Alcohol Level:  Recent Labs Lab 05/03/13 1146  ETH <11    Other results: EKG: paced rhythm at 65 bpm.  Imaging: Ct Head Wo Contrast  05/03/2013   CLINICAL DATA:  Altered mental status  EXAM: CT HEAD WITHOUT CONTRAST  TECHNIQUE: Contiguous axial images were obtained from the base of the skull through the vertex without intravenous contrast.  COMPARISON:  CT 06/13/2011  FINDINGS: Mild atrophy, unchanged. Mild chronic microvascular ischemic change in the white matter.  Negative for acute infarct. Negative for hemorrhage or mass. No midline shift. No acute bony abnormality.  No acute bony abnormality.  IMPRESSION: Atrophy and mild chronic microvascular ischemia. No acute abnormality.   Electronically Signed   By: Franchot Gallo M.D.   On: 05/03/2013 13:33    Assessment: 73 y.o. male presenting with complaints of gait instability and confusion.  Neurological examination significant for some dysmetria but otherwise unremarkable.  Etiology unclear.  Head CT reviewed and show no acute abnormalities.  MRI unable to be performed.  Infarct or a shower of infarcts remains on the differential.  Will rule out other possibilities as well.    Stroke Risk Factors - atrial fibrillation, hyperlipidemia and hypertension  Plan: 1. HgbA1c, fasting lipid panel 2. CTA of the head and neck 3. PT consult, OT consult 4. Echocardiogram 5. Prophylactic therapy-Continue ASA 6. Telemetry monitoring 7. Frequent neuro checks 8. EEG   Alexis Goodell, MD [facility] [phone] 05/04/2013, 1:25 AM

## 2013-05-04 NOTE — Progress Notes (Signed)
OT Cancellation Note  Patient Details Name: Kevin Wiley MRN: [id number] DOB: 1940/02/13   Cancelled Treatment:    Reason Eval/Treat Not Completed: Patient at procedure or test/ unavailable  Benito Mccreedy OTR/L [phone] 05/04/2013, 9:20 AM

## 2013-05-04 NOTE — Procedures (Signed)
EEG report.  Brief clinical history:  73 y.o. male presenting with complaints of gait instability and confusion No prior history of frank epileptic seizures.  Technique: this is a 17 channel routine scalp EEG performed at the bedside with bipolar and monopolar montages arranged in accordance to the international 10/20 system of electrode placement. One channel was dedicated to EKG recording.  The study was performed during wakefulness, drowsiness, and stage 2 sleep. Intermittent photic stimulation was utilized as activating procedure.  Description:In the wakeful state, the best background consisted of a medium amplitude, posterior dominant, well sustained, symmetric and reactive 9 Hz rhythm. Drowsiness demonstrated dropout of the alpha rhythm. Stage 2 sleep showed symmetric and synchronous sleep spindles without intermixed epileptiform discharges. Intermittent photic stimulation did induce a normal driving response.  No focal or generalized epileptiform discharges noted.  No pathologic areas of slowing seen.  EKG showed sinus rhythm.  Impression: this is a normal awake and asleep EEG. Please, be aware that a normal EEG does not exclude the possibility of epilepsy.  Clinical correlation is advised.  Dorian Pod, MD

## 2013-05-04 NOTE — Progress Notes (Signed)
EEG Completed; Results Pending  

## 2013-05-05 DIAGNOSIS — R4182 Altered mental status, unspecified: Secondary | ICD-10-CM | POA: Diagnosis not present

## 2013-05-05 DIAGNOSIS — R279 Unspecified lack of coordination: Secondary | ICD-10-CM | POA: Diagnosis not present

## 2013-05-05 DIAGNOSIS — R269 Unspecified abnormalities of gait and mobility: Secondary | ICD-10-CM | POA: Diagnosis not present

## 2013-05-05 LAB — COMPREHENSIVE METABOLIC PANEL
ALT: 26 U/L (ref 0–53)
AST: 15 U/L (ref 0–37)
Albumin: 3 g/dL — ABNORMAL LOW (ref 3.5–5.2)
Alkaline Phosphatase: 44 U/L (ref 39–117)
BUN: 18 mg/dL (ref 6–23)
CALCIUM: 8.7 mg/dL (ref 8.4–10.5)
CO2: 28 meq/L (ref 19–32)
Chloride: 102 mEq/L (ref 96–112)
Creatinine, Ser: 0.91 mg/dL (ref 0.50–1.35)
GFR calc Af Amer: >90 mL/min (ref >90)
GFR calc non Af Amer: 83 mL/min — ABNORMAL LOW (ref >90)
Glucose, Bld: 97 mg/dL (ref 70–99)
POTASSIUM: 4.7 meq/L (ref 3.7–5.3)
SODIUM: 140 meq/L (ref 137–147)
TOTAL PROTEIN: 5.5 g/dL — AB (ref 6.0–8.3)
Total Bilirubin: 0.6 mg/dL (ref 0.3–1.2)

## 2013-05-05 LAB — CBC WITH DIFFERENTIAL/PLATELET
BASOS PCT: 0 % (ref 0–1)
Basophils Absolute: 0 10*3/uL (ref 0.0–0.1)
EOS PCT: 1 % (ref 0–5)
Eosinophils Absolute: 0.1 10*3/uL (ref 0.0–0.7)
HEMATOCRIT: 39.7 % (ref 39.0–52.0)
HEMOGLOBIN: 13.4 g/dL (ref 13.0–17.0)
Lymphocytes Relative: 29 % (ref 12–46)
Lymphs Abs: 2.1 10*3/uL (ref 0.7–4.0)
MCH: 32.2 pg (ref 26.0–34.0)
MCHC: 33.8 g/dL (ref 30.0–36.0)
MCV: 95.4 fL (ref 78.0–100.0)
MONO ABS: 0.7 10*3/uL (ref 0.1–1.0)
MONOS PCT: 9 % (ref 3–12)
Neutro Abs: 4.4 10*3/uL (ref 1.7–7.7)
Neutrophils Relative %: 60 % (ref 43–77)
Platelets: 112 10*3/uL — ABNORMAL LOW (ref 150–400)
RBC: 4.16 MIL/uL — ABNORMAL LOW (ref 4.22–5.81)
RDW: 14.3 % (ref 11.5–15.5)
WBC: 7.3 10*3/uL (ref 4.0–10.5)

## 2013-05-05 LAB — LIPID PANEL
CHOL/HDL RATIO: 3.9 ratio
Cholesterol: 216 mg/dL — ABNORMAL HIGH (ref 0–200)
HDL: 56 mg/dL (ref >39)
LDL CALC: 133 mg/dL — AB (ref 0–99)
Triglycerides: 137 mg/dL (ref <150)
VLDL: 27 mg/dL (ref 0–40)

## 2013-05-05 LAB — URINE CULTURE
COLONY COUNT: NO GROWTH
Culture: NO GROWTH
SPECIAL REQUESTS: NORMAL

## 2013-05-05 LAB — HEMOGLOBIN A1C
Hgb A1c MFr Bld: 5.7 % — ABNORMAL HIGH (ref <5.7)
Mean Plasma Glucose: 117 mg/dL — ABNORMAL HIGH (ref <117)

## 2013-05-05 NOTE — Progress Notes (Signed)
Physician Daily Progress Note  Subjective: Pt states he is back to baseline   Objective: Vital signs in last 24 hours: Temp:  [97.1 F (36.2 C)-97.9 F (36.6 C)] 97.8 F (36.6 C) (04/01 0600) Pulse Rate:  [64-80] 80 (04/01 0600) Resp:  [18] 18 (04/01 0600) BP: (108-137)/(66-91) 136/89 mmHg (04/01 0600) SpO2:  [96 %-99 %] 97 % (04/01 0600) Weight:  [89.313 kg (196 lb 14.4 oz)] 89.313 kg (196 lb 14.4 oz) (04/01 0600) Weight change: -0.087 kg (-3.1 oz) Last BM Date: 05/02/13  CBG (last 3)  No results found for this basename: GLUCAP,  in the last 72 hours  Intake/Output from previous day:  Intake/Output Summary (Last 24 hours) at 05/05/13 0751 Last data filed at 05/04/13 2246  Gross per 24 hour  Intake    863 ml  Output      0 ml  Net    863 ml   03/31 0701 - 04/01 0700 In: 863 [P.O.:860; I.V.:3] Out: -   Physical Exam General appearance: WM in NAD  Eyes: no scleral icterus Throat: oropharynx moist without erythema Resp: CTAB, no wheezing, rales  Cardio: RRR, no MRG  GI: soft, non-tender; bowel sounds normal; no masses,  no organomegaly Extremities: no clubbing, cyanosis or edema   Lab Results:  Recent Labs  05/03/13 1146 05/03/13 2235 05/04/13 0840 05/05/13 0520  NA 140  --  139 140  K 4.3  --  4.4 4.7  CL 100  --  101 102  CO2 29  --  25 28  GLUCOSE 76  --  90 97  BUN 12  --  17 18  CREATININE 0.84  --  0.84 0.91  CALCIUM 9.3  --  9.1 8.7  MG  --  2.2  --   --   PHOS  --  4.4  --   --      Recent Labs  05/04/13 0840 05/05/13 0520  AST 18 15  ALT 28 26  ALKPHOS 46 44  BILITOT 0.4 0.6  PROT 5.7* 5.5*  ALBUMIN 3.2* 3.0*     Recent Labs  05/04/13 0840 05/05/13 0520  WBC 7.8 7.3  NEUTROABS 4.7 4.4  HGB 13.6 13.4  HCT 40.3 39.7  MCV 95.7 95.4  PLT 134* 112*    No results found for this basename: INR,  PROTIME    No results found for this basename: CKTOTAL, CKMB, CKMBINDEX, TROPONINI,  in the last 72 hours   Recent Labs   05/03/13 2235  TSH 2.050     Recent Labs  05/03/13 2235  VITAMINB12 547    Micro Results: Recent Results (from the past 240 hour(s))  URINE CULTURE     Status: None   Collection Time    05/03/13  1:27 PM      Result Value Ref Range Status   Specimen Description URINE, CLEAN CATCH   Final   Special Requests Normal   Final   Culture  Setup Time     Final   Value: 05/04/2013 07:36     Performed at [place]     Final   Value: NO GROWTH     Performed at [facility]   Culture     Final   Value: NO GROWTH     Performed at [facility]   Report Status 05/05/2013 FINAL   Final    Studies/Results: Ct Angio Head W/cm &/or Wo Cm  05/04/2013   CLINICAL  DATA:  73 year old male with ataxia and episodes of confusion. Initial encounter.  EXAM: CT ANGIOGRAPHY HEAD AND NECK  TECHNIQUE: Multidetector CT imaging of the head and neck was performed using the standard protocol during bolus administration of intravenous contrast. Multiplanar CT image reconstructions and MIPs were obtained to evaluate the vascular anatomy. Carotid stenosis measurements (when applicable) are obtained utilizing NASCET criteria, using the distal internal carotid diameter as the denominator.  CONTRAST:  68mL OMNIPAQUE IOHEXOL 350 MG/ML SOLN  COMPARISON:  Head CT without contrast 05/03/2013.  FINDINGS: CTA HEAD FINDINGS  Calvarium intact. Visualized scalp soft tissues are within normal limits.  Stable and normal for age gray-white matter differentiation throughout the brain. No midline shift, mass effect, or evidence of intracranial mass lesion. No ventriculomegaly. No abnormal enhancement identified.  VASCULAR FINDINGS:  Dominant left vertebral artery is normal intracranial a. dominant left AICA. Non dominant distal right vertebral artery is normal. Right PICA origin is normal. The basilar artery is tortuous without stenosis. SCA and PCA origins are normal. Posterior communicating  arteries are diminutive or absent. Bilateral PCA branches are within normal limits.  Both ICA siphons are patent. Moderate calcified plaque in the bilateral cavernous and supra clinoid segments. No subsequent stenosis. Normal ophthalmic artery origins.  Patent carotid termini. Normal MCA and ACA origins. Dominant left ACA A1 segment. Normal anterior communicating artery. Normal bilateral ACA branches. Normal left MCA branches. Normal right MCA branches.  Review of the MIP images confirms the above findings.  CTA NECK FINDINGS  Right chest cardiac pacemaker. Previous cervical ACDF. Mediastinal lipomatosis. No superior mediastinal lymphadenopathy. Negative lung apices. No acute osseous abnormality in the visible thorax.  Negative thyroid, larynx, pharynx, parapharyngeal spaces, retropharyngeal space, sublingual space, submandibular glands, parotid glands, and orbits soft tissues. Visualized paranasal sinuses and mastoids are clear. No cervical lymphadenopathy. Advanced cervical spine degeneration. ACDF hardware but no arthrodesis at C4-C5. Also, the the left C4 pedicle screw is fractured (series 7, image 57). Meanwhile, the other fused cervical levels demonstrate solid interbody arthrodesis.  VASCULAR FINDINGS:  Four vessel arch configuration, with a dominant left vertebral artery arising directly from the arch. Moderate arch atherosclerosis, but no great vessel origin stenosis.  Normal right CCA origin. Mild/occasional plaque in the right CCA without stenosis. Calcified plaque at the right carotid bifurcation and involving bubble right ICA origin and bulb, but with less than 50 % stenosis with respect to the distal vessel. Mildly tortuous cervical right ICA which briefly has a retropharyngeal course (series 7, image 74).  No proximal right subclavian artery stenosis. Nondominant right vertebral artery origin is affected by soft and calcified plaque and appears stenotic. Still, the diminutive right vertebral artery is  patent throughout the neck and to the skullbase.  Normal left CCA origin. Minimal plaque in the left CCA without stenosis. Patent left carotid bifurcation with mostly calcified plaque affecting the mid left ICA bulb. Subsequent stenosis is up to 65 % with respect to the distal vessel (series 7, image 70). Beyond this level, the cervical left ICA is widely patent and moderately tortuous.  Dominant left vertebral artery arises directly from the arch with calcified plaque at its origin, but no significant stenosis. Otherwise negative cervical left vertebral artery.  Review of the MIP images confirms the above findings.  IMPRESSION: 1. Atherosclerotic stenosis of the left ICA bulb up to 65 % with respect to the distal vessel. 2. Mild to moderate carotid atherosclerosis elsewhere with no other hemodynamically significant stenosis. 3. Dominant left vertebral artery  arises directly from the arch. No vertebral artery stenosis. 4. Negative for age intracranial CTA. 5. Stable and negative Normal for age CT appearance of the brain. 6. 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   Electronically Signed   By: Lars Pinks M.D.   On: 05/04/2013 18:51   Ct Head Wo Contrast  05/03/2013   CLINICAL DATA:  Altered mental status  EXAM: CT HEAD WITHOUT CONTRAST  TECHNIQUE: Contiguous axial images were obtained from the base of the skull through the vertex without intravenous contrast.  COMPARISON:  CT 06/13/2011  FINDINGS: Mild atrophy, unchanged. Mild chronic microvascular ischemic change in the white matter.  Negative for acute infarct. Negative for hemorrhage or mass. No midline shift. No acute bony abnormality.  No acute bony abnormality.  IMPRESSION: Atrophy and mild chronic microvascular ischemia. No acute abnormality.   Electronically Signed   By: Franchot Gallo M.D.   On: 05/03/2013 13:33   Ct Angio Neck W/cm &/or Wo/cm  05/04/2013   CLINICAL DATA:  73 year old male with ataxia and  episodes of confusion. Initial encounter.  EXAM: CT ANGIOGRAPHY HEAD AND NECK  TECHNIQUE: Multidetector CT imaging of the head and neck was performed using the standard protocol during bolus administration of intravenous contrast. Multiplanar CT image reconstructions and MIPs were obtained to evaluate the vascular anatomy. Carotid stenosis measurements (when applicable) are obtained utilizing NASCET criteria, using the distal internal carotid diameter as the denominator.  CONTRAST:  55mL OMNIPAQUE IOHEXOL 350 MG/ML SOLN  COMPARISON:  Head CT without contrast 05/03/2013.  FINDINGS: CTA HEAD FINDINGS  Calvarium intact. Visualized scalp soft tissues are within normal limits.  Stable and normal for age gray-white matter differentiation throughout the brain. No midline shift, mass effect, or evidence of intracranial mass lesion. No ventriculomegaly. No abnormal enhancement identified.  VASCULAR FINDINGS:  Dominant left vertebral artery is normal intracranial a. dominant left AICA. Non dominant distal right vertebral artery is normal. Right PICA origin is normal. The basilar artery is tortuous without stenosis. SCA and PCA origins are normal. Posterior communicating arteries are diminutive or absent. Bilateral PCA branches are within normal limits.  Both ICA siphons are patent. Moderate calcified plaque in the bilateral cavernous and supra clinoid segments. No subsequent stenosis. Normal ophthalmic artery origins.  Patent carotid termini. Normal MCA and ACA origins. Dominant left ACA A1 segment. Normal anterior communicating artery. Normal bilateral ACA branches. Normal left MCA branches. Normal right MCA branches.  Review of the MIP images confirms the above findings.  CTA NECK FINDINGS  Right chest cardiac pacemaker. Previous cervical ACDF. Mediastinal lipomatosis. No superior mediastinal lymphadenopathy. Negative lung apices. No acute osseous abnormality in the visible thorax.  Negative thyroid, larynx, pharynx,  parapharyngeal spaces, retropharyngeal space, sublingual space, submandibular glands, parotid glands, and orbits soft tissues. Visualized paranasal sinuses and mastoids are clear. No cervical lymphadenopathy. Advanced cervical spine degeneration. ACDF hardware but no arthrodesis at C4-C5. Also, the the left C4 pedicle screw is fractured (series 7, image 57). Meanwhile, the other fused cervical levels demonstrate solid interbody arthrodesis.  VASCULAR FINDINGS:  Four vessel arch configuration, with a dominant left vertebral artery arising directly from the arch. Moderate arch atherosclerosis, but no great vessel origin stenosis.  Normal right CCA origin. Mild/occasional plaque in the right CCA without stenosis. Calcified plaque at the right carotid bifurcation and involving bubble right ICA origin and bulb, but with less than 50 % stenosis with respect to the distal vessel. Mildly tortuous cervical right ICA  which briefly has a retropharyngeal course (series 7, image 74).  No proximal right subclavian artery stenosis. Nondominant right vertebral artery origin is affected by soft and calcified plaque and appears stenotic. Still, the diminutive right vertebral artery is patent throughout the neck and to the skullbase.  Normal left CCA origin. Minimal plaque in the left CCA without stenosis. Patent left carotid bifurcation with mostly calcified plaque affecting the mid left ICA bulb. Subsequent stenosis is up to 65 % with respect to the distal vessel (series 7, image 70). Beyond this level, the cervical left ICA is widely patent and moderately tortuous.  Dominant left vertebral artery arises directly from the arch with calcified plaque at its origin, but no significant stenosis. Otherwise negative cervical left vertebral artery.  Review of the MIP images confirms the above findings.  IMPRESSION: 1. Atherosclerotic stenosis of the left ICA bulb up to 65 % with respect to the distal vessel. 2. Mild to moderate carotid  atherosclerosis elsewhere with no other hemodynamically significant stenosis. 3. Dominant left vertebral artery arises directly from the arch. No vertebral artery stenosis. 4. Negative for age intracranial CTA. 5. Stable and negative Normal for age CT appearance of the brain. 6. 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   Electronically Signed   By: Lars Pinks M.D.   On: 05/04/2013 18:51     Medications: Scheduled: . amLODipine  5 mg Oral Daily  . aspirin EC  81 mg Oral Daily  . atenolol  25 mg Oral Daily  . atorvastatin  80 mg Oral q1800  . benazepril  20 mg Oral Daily  . diazepam  10 mg Oral BID PC  . enoxaparin (LOVENOX) injection  40 mg Subcutaneous Q24H  . finasteride  5 mg Oral Daily  . gabapentin  800 mg Oral QHS  . mirabegron ER  25 mg Oral Daily  . pantoprazole  40 mg Oral Daily  . sodium chloride  3 mL Intravenous Q12H  . sulfaSALAzine  500 mg Oral BID  . tamsulosin  0.4 mg Oral Daily  . traZODone  150 mg Oral QHS  . venlafaxine XR  150 mg Oral Daily  . zolpidem  5 mg Oral QHS   Continuous:   Assessment/Plan:   AMS/ ataxic gait - Tele shows no arrythmia, just paced rhythm. CTA shows no severe stenosis. TTE was normal. EEG was normal. Current most likely dx is TIA. Will await neuro recs and consider discharge today   Dispo - pending above w/u   DVT Prophylaxis   LOS: 2 days   Levie Wages 05/05/2013, 7:51 AM

## 2013-05-05 NOTE — Evaluation (Signed)
Physical Therapy Evaluation Patient Details Name: Kevin Wiley MRN: [id number] DOB: February 01, 1941 Today's Date: 05/05/2013   History of Present Illness  Kevin Wiley is a pleasant gentleman with multiple medical issues.   He reports two weeks of symptoms.  He has increased trouble getting up from a seated position. He has to rock to get up and then once he gets up he almost falls (in fact he did fall 3-4 times).  He has never been like that before.   He seemed disoriented to agree.  He reports having some word finding problems.  He was noted to be driving in the wrong direction in traffic.   Police were called.  He presents to the ER where is a bit anxious, bp is elevated and he  Has dysmetria of the left upper extremity.  He has a pacemaker so we cannot get an mri but cct is negative.  Clinical Impression  Pt with some baseline mobility deficits but overall functionally independent.  VCs for safety with activity. No further acute PT needs. Will sign off.    Follow Up Recommendations No PT follow up    Equipment Recommendations  None recommended by PT    Recommendations for Other Services       Precautions / Restrictions Precautions Precautions: Fall Restrictions Weight Bearing Restrictions: No      Mobility  Bed Mobility Overal bed mobility: Modified Independent                Transfers Overall transfer level: Needs assistance Equipment used: None Transfers: Sit to/from Stand Sit to Stand: Modified independent (Device/Increase time)            Ambulation/Gait Ambulation/Gait assistance: Modified independent (Device/Increase time) Ambulation Distance (Feet): 440 Feet Assistive device: None Gait Pattern/deviations: Step-through pattern;Trunk flexed;Narrow base of support Gait velocity: WFL Gait velocity interpretation: at or above normal speed for age/gender General Gait Details: steady with ambulation  Stairs Stairs: Yes Stairs assistance: Supervision Stair Management:  One rail Right Number of Stairs: 12 General stair comments: impulsive initially, cued for safety  Wheelchair Mobility    Modified Rankin (Stroke Patients Only)       Balance Overall balance assessment: No apparent balance deficits (not formally assessed)                                   Pertinent Vitals/Pain No pain at this time    Home Living Family/patient expects to be discharged to:: Private residence Living Arrangements: Spouse/significant other Available Help at Discharge: Family (most of the day) Type of Home: House Home Access: Stairs to enter Entrance Stairs-Rails: None Entrance Stairs-Number of Steps: 1 Home Layout: Multi-level Home Equipment: Environmental consultant - 2 wheels;Cane - single point;Tub bench;Grab bars - toilet      Prior Function Level of Independence: Independent               Hand Dominance        Extremity/Trunk Assessment   Upper Extremity Assessment: Defer to OT evaluation           Lower Extremity Assessment: Overall WFL for tasks assessed         Communication   Communication: HOH  Cognition Arousal/Alertness: Awake/alert Behavior During Therapy: WFL for tasks assessed/performed Overall Cognitive Status: Within Functional Limits for tasks assessed (cue for name of hospital then able to say it)  General Comments      Exercises        Assessment/Plan    PT Assessment Patent does not need any further PT services  PT Diagnosis     PT Problem List    PT Treatment Interventions     PT Goals (Current goals can be found in the Care Plan section) Acute Rehab PT Goals PT Goal Formulation: No goals set, d/c therapy    Frequency     Barriers to discharge        End of Session Equipment Utilized During Treatment: Gait belt Activity Tolerance: Patient tolerated treatment well Patient left: in bed (seated EOB)         Time: 9485-4627 PT Time Calculation (min): 16  min   Charges:   PT Evaluation $Initial PT Evaluation Tier I: 1 Procedure PT Treatments $Gait Training: 8-22 mins   PT G CodesDuncan Dull 05/05/2013, 8:34 AM Alben Deeds, PT DPT  [phone]

## 2013-05-05 NOTE — Discharge Summary (Signed)
Physician Discharge Summary    Kevin Wiley  MR#: [id number]  DOB:Dec 28, 1940  Date of Admission: 05/03/2013 Date of Discharge: 05/05/2013  Attending Physician:Vandy Tsuchiya  Patient's IHK:[id number],LOVFIEP A, MD  Consults:  neurology   Discharge Diagnoses: Active Problems:   Ataxia   Altered mental status   Discharge Medications:   Medication List         amLODipine 10 MG tablet  Commonly known as:  NORVASC  Take 2.5 mg by mouth daily.     aspirin 81 MG tablet  Take 81 mg by mouth daily.     atenolol 25 MG tablet  Commonly known as:  TENORMIN  Take 1 tablet (25 mg total) by mouth daily.     benazepril 20 MG tablet  Commonly known as:  LOTENSIN  Take 1 tablet (20 mg total) by mouth daily.     colestipol 1 G tablet  Commonly known as:  COLESTID  Take 1 g by mouth as needed (Crohns Flare Up).     cyclobenzaprine 10 MG tablet  Commonly known as:  FLEXERIL  Take 10 mg by mouth 3 (three) times daily as needed for muscle spasms.     diazepam 10 MG tablet  Commonly known as:  VALIUM  Take 10 mg by mouth 2 (two) times daily as needed for anxiety.     finasteride 5 MG tablet  Commonly known as:  PROSCAR  Take 5 mg by mouth daily.     fish oil-omega-3 fatty acids 1000 MG capsule  Take 1 g by mouth daily.     gabapentin 800 MG tablet  Commonly known as:  NEURONTIN  Take 800 mg by mouth at bedtime.     MYRBETRIQ 25 MG Tb24 tablet  Generic drug:  mirabegron ER  Take 25 mg by mouth daily.     omeprazole 20 MG capsule  Commonly known as:  PRILOSEC  Take 20 mg by mouth daily.     rosuvastatin 40 MG tablet  Commonly known as:  CRESTOR  Take 40 mg by mouth daily.     sulfaSALAzine 500 MG tablet  Commonly known as:  AZULFIDINE  Take 500 mg by mouth 2 (two) times daily.     tamsulosin 0.4 MG Caps capsule  Commonly known as:  FLOMAX  Take 0.4 mg by mouth.     traZODone 100 MG tablet  Commonly known as:  DESYREL  Take 150 mg by mouth at bedtime.      venlafaxine XR 150 MG 24 hr capsule  Commonly known as:  EFFEXOR-XR  Take 150 mg by mouth daily.     zolpidem 10 MG tablet  Commonly known as:  AMBIEN  Take 10 mg by mouth at bedtime.        Hospital Procedures: Ct Angio Head W/cm &/or Wo Cm  05/04/2013   CLINICAL DATA:  73 year old male with ataxia and episodes of confusion. Initial encounter.  EXAM: CT ANGIOGRAPHY HEAD AND NECK  TECHNIQUE: Multidetector CT imaging of the head and neck was performed using the standard protocol during bolus administration of intravenous contrast. Multiplanar CT image reconstructions and MIPs were obtained to evaluate the vascular anatomy. Carotid stenosis measurements (when applicable) are obtained utilizing NASCET criteria, using the distal internal carotid diameter as the denominator.  CONTRAST:  45mL OMNIPAQUE IOHEXOL 350 MG/ML SOLN  COMPARISON:  Head CT without contrast 05/03/2013.  FINDINGS: CTA HEAD FINDINGS  Calvarium intact. Visualized scalp soft tissues are within normal limits.  Stable and normal for age  gray-white matter differentiation throughout the brain. No midline shift, mass effect, or evidence of intracranial mass lesion. No ventriculomegaly. No abnormal enhancement identified.  VASCULAR FINDINGS:  Dominant left vertebral artery is normal intracranial a. dominant left AICA. Non dominant distal right vertebral artery is normal. Right PICA origin is normal. The basilar artery is tortuous without stenosis. SCA and PCA origins are normal. Posterior communicating arteries are diminutive or absent. Bilateral PCA branches are within normal limits.  Both ICA siphons are patent. Moderate calcified plaque in the bilateral cavernous and supra clinoid segments. No subsequent stenosis. Normal ophthalmic artery origins.  Patent carotid termini. Normal MCA and ACA origins. Dominant left ACA A1 segment. Normal anterior communicating artery. Normal bilateral ACA branches. Normal left MCA branches. Normal right MCA  branches.  Review of the MIP images confirms the above findings.  CTA NECK FINDINGS  Right chest cardiac pacemaker. Previous cervical ACDF. Mediastinal lipomatosis. No superior mediastinal lymphadenopathy. Negative lung apices. No acute osseous abnormality in the visible thorax.  Negative thyroid, larynx, pharynx, parapharyngeal spaces, retropharyngeal space, sublingual space, submandibular glands, parotid glands, and orbits soft tissues. Visualized paranasal sinuses and mastoids are clear. No cervical lymphadenopathy. Advanced cervical spine degeneration. ACDF hardware but no arthrodesis at C4-C5. Also, the the left C4 pedicle screw is fractured (series 7, image 57). Meanwhile, the other fused cervical levels demonstrate solid interbody arthrodesis.  VASCULAR FINDINGS:  Four vessel arch configuration, with a dominant left vertebral artery arising directly from the arch. Moderate arch atherosclerosis, but no great vessel origin stenosis.  Normal right CCA origin. Mild/occasional plaque in the right CCA without stenosis. Calcified plaque at the right carotid bifurcation and involving bubble right ICA origin and bulb, but with less than 50 % stenosis with respect to the distal vessel. Mildly tortuous cervical right ICA which briefly has a retropharyngeal course (series 7, image 74).  No proximal right subclavian artery stenosis. Nondominant right vertebral artery origin is affected by soft and calcified plaque and appears stenotic. Still, the diminutive right vertebral artery is patent throughout the neck and to the skullbase.  Normal left CCA origin. Minimal plaque in the left CCA without stenosis. Patent left carotid bifurcation with mostly calcified plaque affecting the mid left ICA bulb. Subsequent stenosis is up to 65 % with respect to the distal vessel (series 7, image 70). Beyond this level, the cervical left ICA is widely patent and moderately tortuous.  Dominant left vertebral artery arises directly from the  arch with calcified plaque at its origin, but no significant stenosis. Otherwise negative cervical left vertebral artery.  Review of the MIP images confirms the above findings.  IMPRESSION: 1. Atherosclerotic stenosis of the left ICA bulb up to 65 % with respect to the distal vessel. 2. Mild to moderate carotid atherosclerosis elsewhere with no other hemodynamically significant stenosis. 3. Dominant left vertebral artery arises directly from the arch. No vertebral artery stenosis. 4. Negative for age intracranial CTA. 5. Stable and negative Normal for age CT appearance of the brain. 6. 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   Electronically Signed   By: Lars Pinks M.D.   On: 05/04/2013 18:51   Ct Head Wo Contrast  05/03/2013   CLINICAL DATA:  Altered mental status  EXAM: CT HEAD WITHOUT CONTRAST  TECHNIQUE: Contiguous axial images were obtained from the base of the skull through the vertex without intravenous contrast.  COMPARISON:  CT 06/13/2011  FINDINGS: Mild atrophy, unchanged. Mild chronic microvascular ischemic change  in the white matter.  Negative for acute infarct. Negative for hemorrhage or mass. No midline shift. No acute bony abnormality.  No acute bony abnormality.  IMPRESSION: Atrophy and mild chronic microvascular ischemia. No acute abnormality.   Electronically Signed   By: Franchot Gallo M.D.   On: 05/03/2013 13:33   Ct Angio Neck W/cm &/or Wo/cm  05/04/2013   CLINICAL DATA:  73 year old male with ataxia and episodes of confusion. Initial encounter.  EXAM: CT ANGIOGRAPHY HEAD AND NECK  TECHNIQUE: Multidetector CT imaging of the head and neck was performed using the standard protocol during bolus administration of intravenous contrast. Multiplanar CT image reconstructions and MIPs were obtained to evaluate the vascular anatomy. Carotid stenosis measurements (when applicable) are obtained utilizing NASCET criteria, using the distal internal carotid  diameter as the denominator.  CONTRAST:  64mL OMNIPAQUE IOHEXOL 350 MG/ML SOLN  COMPARISON:  Head CT without contrast 05/03/2013.  FINDINGS: CTA HEAD FINDINGS  Calvarium intact. Visualized scalp soft tissues are within normal limits.  Stable and normal for age gray-white matter differentiation throughout the brain. No midline shift, mass effect, or evidence of intracranial mass lesion. No ventriculomegaly. No abnormal enhancement identified.  VASCULAR FINDINGS:  Dominant left vertebral artery is normal intracranial a. dominant left AICA. Non dominant distal right vertebral artery is normal. Right PICA origin is normal. The basilar artery is tortuous without stenosis. SCA and PCA origins are normal. Posterior communicating arteries are diminutive or absent. Bilateral PCA branches are within normal limits.  Both ICA siphons are patent. Moderate calcified plaque in the bilateral cavernous and supra clinoid segments. No subsequent stenosis. Normal ophthalmic artery origins.  Patent carotid termini. Normal MCA and ACA origins. Dominant left ACA A1 segment. Normal anterior communicating artery. Normal bilateral ACA branches. Normal left MCA branches. Normal right MCA branches.  Review of the MIP images confirms the above findings.  CTA NECK FINDINGS  Right chest cardiac pacemaker. Previous cervical ACDF. Mediastinal lipomatosis. No superior mediastinal lymphadenopathy. Negative lung apices. No acute osseous abnormality in the visible thorax.  Negative thyroid, larynx, pharynx, parapharyngeal spaces, retropharyngeal space, sublingual space, submandibular glands, parotid glands, and orbits soft tissues. Visualized paranasal sinuses and mastoids are clear. No cervical lymphadenopathy. Advanced cervical spine degeneration. ACDF hardware but no arthrodesis at C4-C5. Also, the the left C4 pedicle screw is fractured (series 7, image 57). Meanwhile, the other fused cervical levels demonstrate solid interbody arthrodesis.   VASCULAR FINDINGS:  Four vessel arch configuration, with a dominant left vertebral artery arising directly from the arch. Moderate arch atherosclerosis, but no great vessel origin stenosis.  Normal right CCA origin. Mild/occasional plaque in the right CCA without stenosis. Calcified plaque at the right carotid bifurcation and involving bubble right ICA origin and bulb, but with less than 50 % stenosis with respect to the distal vessel. Mildly tortuous cervical right ICA which briefly has a retropharyngeal course (series 7, image 74).  No proximal right subclavian artery stenosis. Nondominant right vertebral artery origin is affected by soft and calcified plaque and appears stenotic. Still, the diminutive right vertebral artery is patent throughout the neck and to the skullbase.  Normal left CCA origin. Minimal plaque in the left CCA without stenosis. Patent left carotid bifurcation with mostly calcified plaque affecting the mid left ICA bulb. Subsequent stenosis is up to 65 % with respect to the distal vessel (series 7, image 70). Beyond this level, the cervical left ICA is widely patent and moderately tortuous.  Dominant left vertebral artery arises directly from  the arch with calcified plaque at its origin, but no significant stenosis. Otherwise negative cervical left vertebral artery.  Review of the MIP images confirms the above findings.  IMPRESSION: 1. Atherosclerotic stenosis of the left ICA bulb up to 65 % with respect to the distal vessel. 2. Mild to moderate carotid atherosclerosis elsewhere with no other hemodynamically significant stenosis. 3. Dominant left vertebral artery arises directly from the arch. No vertebral artery stenosis. 4. Negative for age intracranial CTA. 5. Stable and negative Normal for age CT appearance of the brain. 6. 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   Electronically Signed   By: Lars Pinks M.D.   On: 05/04/2013 18:51    History of  Present Illness: [name] is a pleasant gentleman with multiple medical issues. He reports two weeks of symptoms. He has increased trouble getting up from a seated position. He has to rock to get up and then once he gets up he almost falls (in fact he did fall 3-4 times). He has never been like that before. He seemed disoriented to agree. He reports having some word finding problems. He was noted to be driving in the wrong direction in traffic. Police were called. He presents to the ER where is a bit anxious, bp is elevated and he Has dysmetria of the left upper extremity. He has a pacemaker so we cannot get an mri but cct is negative.   Hospital Course: TIA vs medication side effect - CT showed NAICA, unable to get MRI 2/2 pacer. Tele showed only paced rhythm while admitted. EEG showed no seizure. CTA showed no severe stenosis. TTE showed no valve stenosis. Neurology states continue risk factor treatment and ASA 81 daily. He is on high dose statin for lipids, ASA 81 and BP is acceptable.He will follow up as outpt for continued monitoring, but patient has returned to baseline at this time. He is on high statin ambien and neurontin qhs that could be contributing and PCP may want to consider decreasing dosage of if condition recurs , but these meds have been at stable doses for years and no recent medication changes   Day of Discharge Exam BP 147/89  Pulse 65  Temp(Src) 97.5 F (36.4 C) (Oral)  Resp 18  Ht 5\' 9"  (1.753 m)  Wt 89.313 kg (196 lb 14.4 oz)  BMI 29.06 kg/m2  SpO2 98%  Physical Exam: General appearance: WM in NAD  Eyes: no scleral icterus Throat: oropharynx moist without erythema Resp: CTAB, no wheezes or rales  Cardio: RRR, no MRG  GI: soft, non-tender; bowel sounds normal; no masses,  no organomegaly Extremities: no clubbing, cyanosis or edema  Discharge Labs:  Recent Labs  05/03/13 1146 05/03/13 2235 05/04/13 0840 05/05/13 0520  NA 140  --  139 140  K 4.3  --  4.4 4.7  CL  100  --  101 102  CO2 29  --  25 28  GLUCOSE 76  --  90 97  BUN 12  --  17 18  CREATININE 0.84  --  0.84 0.91  CALCIUM 9.3  --  9.1 8.7  MG  --  2.2  --   --   PHOS  --  4.4  --   --     Recent Labs  05/04/13 0840 05/05/13 0520  AST 18 15  ALT 28 26  ALKPHOS 46 44  BILITOT 0.4 0.6  PROT 5.7* 5.5*  ALBUMIN 3.2* 3.0*  Recent Labs  05/04/13 0840 05/05/13 0520  WBC 7.8 7.3  NEUTROABS 4.7 4.4  HGB 13.6 13.4  HCT 40.3 39.7  MCV 95.7 95.4  PLT 134* 112*   No results found for this basename: INR, PROTIME   No results found for this basename: CKTOTAL, CKMB, CKMBINDEX, TROPONINI,  in the last 72 hours  Recent Labs  05/03/13 2235  TSH 2.050    Recent Labs  05/03/13 2235  VITAMINB12 547    Discharge instructions:     Discharge Orders   Future Appointments Provider Department Dept Phone   06/03/2013 3:00 PM Sanda Klein, MD [facility] [phone]   06/15/2013 10:30 AM Sanda Klein, MD [facility] [phone]   Future Orders Complete By Expires   Call MD for:  As directed    Comments:     Focal weakness, slurred speech, or any other focal neuro concerns   Diet - low sodium heart healthy  As directed    Increase activity slowly  As directed      01-Home or Self Care   Disposition: home   Follow-up Appts: Follow-up with Dr. Reynaldo Minium at [facility] in 5-14 days.  Call for appointment.  Condition on Discharge: stable   Tests Needing Follow-up: A1C  Time spent in discharge (includes decision making & examination of pt): 45 minutes    Signed: Llewyn Heap 05/05/2013, 1:15 PM

## 2013-05-05 NOTE — Progress Notes (Signed)
Discharge instructions given. Pt verbalized understanding and all questions were answered.  

## 2013-05-05 NOTE — Progress Notes (Signed)
NEURO HOSPITALIST PROGRESS NOTE   SUBJECTIVE:                                                                                                                         Patient feels back to his baseline.  No further complaints.  OBJECTIVE:                                                                                                                           Vital signs in last 24 hours: Temp:  [97.1 F (36.2 C)-97.9 F (36.6 C)] 97.8 F (36.6 C) (04/01 0600) Pulse Rate:  [64-80] 80 (04/01 0600) Resp:  [18] 18 (04/01 0600) BP: (108-137)/(66-91) 136/89 mmHg (04/01 0600) SpO2:  [96 %-99 %] 97 % (04/01 0600) Weight:  [89.313 kg (196 lb 14.4 oz)] 89.313 kg (196 lb 14.4 oz) (04/01 0600)  Intake/Output from previous day: 03/31 0701 - 04/01 0700 In: 863 [P.O.:860; I.V.:3] Out: -  Intake/Output this shift: Total I/O In: 120 [P.O.:120] Out: -  Nutritional status: Cardiac  Past Medical History  Diagnosis Date  . Pacemaker   . Atrial fibrillation   . Anxiety   . Depression   . Hypertension   . Hypercholesteremia   . BPH (benign prostatic hyperplasia)   . Crohn disease   . PTSD (post-traumatic stress disorder)      Neurologic Exam:  Mental Status: Alert, oriented, thought content appropriate.  Speech fluent without evidence of aphasia.  Able to follow 3 step commands without difficulty. Cranial Nerves: II: Discs flat bilaterally; Visual fields grossly normal, pupils equal, round, reactive to light and accommodation III,IV, VI: ptosis not present, extra-ocular motions intact bilaterally V,VII: smile symmetric, facial light touch sensation normal bilaterally VIII: hearing normal bilaterally IX,X: gag reflex present XI: bilateral shoulder shrug XII: midline tongue extension without atrophy or fasciculations  Motor: Right : Upper extremity   5/5    Left:     Upper extremity   5/5  Lower extremity   5/5     Lower extremity   5/5 Tone and  bulk:normal tone throughout; no atrophy noted Sensory: Pinprick and light touch intact throughout, bilaterally Deep Tendon Reflexes:  Right: Upper Extremity   Left: Upper extremity   biceps (  C-5 to C-6) 2/4   biceps (C-5 to C-6) 2/4 tricep (C7) 2/4    triceps (C7) 2/4 Brachioradialis (C6) 2/4  Brachioradialis (C6) 2/4  Lower Extremity Lower Extremity  quadriceps (L-2 to L-4) 2/4   quadriceps (L-2 to L-4) 2/4 Achilles (S1) 0/4   Achilles (S1) 0/4  Plantars: Right: mute   Left: up going Cerebellar: normal finger-to-nose,  normal heel-to-shin test Gait: normal CV: pulses palpable throughout    Lab Results: Basic Metabolic Panel:  Recent Labs Lab 05/03/13 1146 05/03/13 2235 05/04/13 0840 05/05/13 0520  NA 140  --  139 140  K 4.3  --  4.4 4.7  CL 100  --  101 102  CO2 29  --  25 28  GLUCOSE 76  --  90 97  BUN 12  --  17 18  CREATININE 0.84  --  0.84 0.91  CALCIUM 9.3  --  9.1 8.7  MG  --  2.2  --   --   PHOS  --  4.4  --   --     Liver Function Tests:  Recent Labs Lab 05/03/13 1146 05/04/13 0840 05/05/13 0520  AST 22 18 15   ALT 32 28 26  ALKPHOS 57 46 44  BILITOT 0.5 0.4 0.6  PROT 6.9 5.7* 5.5*  ALBUMIN 4.0 3.2* 3.0*   No results found for this basename: LIPASE, AMYLASE,  in the last 168 hours No results found for this basename: AMMONIA,  in the last 168 hours  CBC:  Recent Labs Lab 05/03/13 1146 05/04/13 0840 05/05/13 0520  WBC 8.4 7.8 7.3  NEUTROABS 5.3 4.7 4.4  HGB 15.0 13.6 13.4  HCT 44.1 40.3 39.7  MCV 94.8 95.7 95.4  PLT 121* 134* 112*    Cardiac Enzymes: No results found for this basename: CKTOTAL, CKMB, CKMBINDEX, TROPONINI,  in the last 168 hours  Lipid Panel:  Recent Labs Lab 05/05/13 0520  CHOL 216*  TRIG 137  HDL 56  CHOLHDL 3.9  VLDL 27  LDLCALC 133*    CBG: No results found for this basename: GLUCAP,  in the last 168 hours  Microbiology: Results for orders placed during the hospital encounter of 05/03/13  URINE  CULTURE     Status: None   Collection Time    05/03/13  1:27 PM      Result Value Ref Range Status   Specimen Description URINE, CLEAN CATCH   Final   Special Requests Normal   Final   Culture  Setup Time     Final   Value: 05/04/2013 07:36     Performed at [facility] Count     Final   Value: NO GROWTH     Performed at [facility]   Culture     Final   Value: NO GROWTH     Performed at [facility]   Report Status 05/05/2013 FINAL   Final    Coagulation Studies: No results found for this basename: LABPROT, INR,  in the last 72 hours  Imaging: Ct Angio Head W/cm &/or Wo Cm  05/04/2013   CLINICAL DATA:  73 year old male with ataxia and episodes of confusion. Initial encounter.  EXAM: CT ANGIOGRAPHY HEAD AND NECK  TECHNIQUE: Multidetector CT imaging of the head and neck was performed using the standard protocol during bolus administration of intravenous contrast. Multiplanar CT image reconstructions and MIPs were obtained to evaluate the vascular anatomy. Carotid stenosis measurements (when applicable) are obtained utilizing  NASCET criteria, using the distal internal carotid diameter as the denominator.  CONTRAST:  75mL OMNIPAQUE IOHEXOL 350 MG/ML SOLN  COMPARISON:  Head CT without contrast 05/03/2013.  FINDINGS: CTA HEAD FINDINGS  Calvarium intact. Visualized scalp soft tissues are within normal limits.  Stable and normal for age gray-white matter differentiation throughout the brain. No midline shift, mass effect, or evidence of intracranial mass lesion. No ventriculomegaly. No abnormal enhancement identified.  VASCULAR FINDINGS:  Dominant left vertebral artery is normal intracranial a. dominant left AICA. Non dominant distal right vertebral artery is normal. Right PICA origin is normal. The basilar artery is tortuous without stenosis. SCA and PCA origins are normal. Posterior communicating arteries are diminutive or absent. Bilateral PCA branches are within  normal limits.  Both ICA siphons are patent. Moderate calcified plaque in the bilateral cavernous and supra clinoid segments. No subsequent stenosis. Normal ophthalmic artery origins.  Patent carotid termini. Normal MCA and ACA origins. Dominant left ACA A1 segment. Normal anterior communicating artery. Normal bilateral ACA branches. Normal left MCA branches. Normal right MCA branches.  Review of the MIP images confirms the above findings.  CTA NECK FINDINGS  Right chest cardiac pacemaker. Previous cervical ACDF. Mediastinal lipomatosis. No superior mediastinal lymphadenopathy. Negative lung apices. No acute osseous abnormality in the visible thorax.  Negative thyroid, larynx, pharynx, parapharyngeal spaces, retropharyngeal space, sublingual space, submandibular glands, parotid glands, and orbits soft tissues. Visualized paranasal sinuses and mastoids are clear. No cervical lymphadenopathy. Advanced cervical spine degeneration. ACDF hardware but no arthrodesis at C4-C5. Also, the the left C4 pedicle screw is fractured (series 7, image 57). Meanwhile, the other fused cervical levels demonstrate solid interbody arthrodesis.  VASCULAR FINDINGS:  Four vessel arch configuration, with a dominant left vertebral artery arising directly from the arch. Moderate arch atherosclerosis, but no great vessel origin stenosis.  Normal right CCA origin. Mild/occasional plaque in the right CCA without stenosis. Calcified plaque at the right carotid bifurcation and involving bubble right ICA origin and bulb, but with less than 50 % stenosis with respect to the distal vessel. Mildly tortuous cervical right ICA which briefly has a retropharyngeal course (series 7, image 74).  No proximal right subclavian artery stenosis. Nondominant right vertebral artery origin is affected by soft and calcified plaque and appears stenotic. Still, the diminutive right vertebral artery is patent throughout the neck and to the skullbase.  Normal left CCA  origin. Minimal plaque in the left CCA without stenosis. Patent left carotid bifurcation with mostly calcified plaque affecting the mid left ICA bulb. Subsequent stenosis is up to 65 % with respect to the distal vessel (series 7, image 70). Beyond this level, the cervical left ICA is widely patent and moderately tortuous.  Dominant left vertebral artery arises directly from the arch with calcified plaque at its origin, but no significant stenosis. Otherwise negative cervical left vertebral artery.  Review of the MIP images confirms the above findings.  IMPRESSION: 1. Atherosclerotic stenosis of the left ICA bulb up to 65 % with respect to the distal vessel. 2. Mild to moderate carotid atherosclerosis elsewhere with no other hemodynamically significant stenosis. 3. Dominant left vertebral artery arises directly from the arch. No vertebral artery stenosis. 4. Negative for age intracranial CTA. 5. Stable and negative Normal for age CT appearance of the brain. 6. 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   Electronically Signed   By: Lars Pinks M.D.   On: 05/04/2013 18:51   Ct Head Wo Contrast  05/03/2013   CLINICAL DATA:  Altered mental status  EXAM: CT HEAD WITHOUT CONTRAST  TECHNIQUE: Contiguous axial images were obtained from the base of the skull through the vertex without intravenous contrast.  COMPARISON:  CT 06/13/2011  FINDINGS: Mild atrophy, unchanged. Mild chronic microvascular ischemic change in the white matter.  Negative for acute infarct. Negative for hemorrhage or mass. No midline shift. No acute bony abnormality.  No acute bony abnormality.  IMPRESSION: Atrophy and mild chronic microvascular ischemia. No acute abnormality.   Electronically Signed   By: Franchot Gallo M.D.   On: 05/03/2013 13:33   Ct Angio Neck W/cm &/or Wo/cm  05/04/2013   CLINICAL DATA:  73 year old male with ataxia and episodes of confusion. Initial encounter.  EXAM: CT ANGIOGRAPHY HEAD  AND NECK  TECHNIQUE: Multidetector CT imaging of the head and neck was performed using the standard protocol during bolus administration of intravenous contrast. Multiplanar CT image reconstructions and MIPs were obtained to evaluate the vascular anatomy. Carotid stenosis measurements (when applicable) are obtained utilizing NASCET criteria, using the distal internal carotid diameter as the denominator.  CONTRAST:  46mL OMNIPAQUE IOHEXOL 350 MG/ML SOLN  COMPARISON:  Head CT without contrast 05/03/2013.  FINDINGS: CTA HEAD FINDINGS  Calvarium intact. Visualized scalp soft tissues are within normal limits.  Stable and normal for age gray-white matter differentiation throughout the brain. No midline shift, mass effect, or evidence of intracranial mass lesion. No ventriculomegaly. No abnormal enhancement identified.  VASCULAR FINDINGS:  Dominant left vertebral artery is normal intracranial a. dominant left AICA. Non dominant distal right vertebral artery is normal. Right PICA origin is normal. The basilar artery is tortuous without stenosis. SCA and PCA origins are normal. Posterior communicating arteries are diminutive or absent. Bilateral PCA branches are within normal limits.  Both ICA siphons are patent. Moderate calcified plaque in the bilateral cavernous and supra clinoid segments. No subsequent stenosis. Normal ophthalmic artery origins.  Patent carotid termini. Normal MCA and ACA origins. Dominant left ACA A1 segment. Normal anterior communicating artery. Normal bilateral ACA branches. Normal left MCA branches. Normal right MCA branches.  Review of the MIP images confirms the above findings.  CTA NECK FINDINGS  Right chest cardiac pacemaker. Previous cervical ACDF. Mediastinal lipomatosis. No superior mediastinal lymphadenopathy. Negative lung apices. No acute osseous abnormality in the visible thorax.  Negative thyroid, larynx, pharynx, parapharyngeal spaces, retropharyngeal space, sublingual space,  submandibular glands, parotid glands, and orbits soft tissues. Visualized paranasal sinuses and mastoids are clear. No cervical lymphadenopathy. Advanced cervical spine degeneration. ACDF hardware but no arthrodesis at C4-C5. Also, the the left C4 pedicle screw is fractured (series 7, image 57). Meanwhile, the other fused cervical levels demonstrate solid interbody arthrodesis.  VASCULAR FINDINGS:  Four vessel arch configuration, with a dominant left vertebral artery arising directly from the arch. Moderate arch atherosclerosis, but no great vessel origin stenosis.  Normal right CCA origin. Mild/occasional plaque in the right CCA without stenosis. Calcified plaque at the right carotid bifurcation and involving bubble right ICA origin and bulb, but with less than 50 % stenosis with respect to the distal vessel. Mildly tortuous cervical right ICA which briefly has a retropharyngeal course (series 7, image 74).  No proximal right subclavian artery stenosis. Nondominant right vertebral artery origin is affected by soft and calcified plaque and appears stenotic. Still, the diminutive right vertebral artery is patent throughout the neck and to the skullbase.  Normal left CCA origin. Minimal plaque in the left CCA without stenosis. Patent left carotid  bifurcation with mostly calcified plaque affecting the mid left ICA bulb. Subsequent stenosis is up to 65 % with respect to the distal vessel (series 7, image 70). Beyond this level, the cervical left ICA is widely patent and moderately tortuous.  Dominant left vertebral artery arises directly from the arch with calcified plaque at its origin, but no significant stenosis. Otherwise negative cervical left vertebral artery.  Review of the MIP images confirms the above findings.  IMPRESSION: 1. Atherosclerotic stenosis of the left ICA bulb up to 65 % with respect to the distal vessel. 2. Mild to moderate carotid atherosclerosis elsewhere with no other hemodynamically significant  stenosis. 3. Dominant left vertebral artery arises directly from the arch. No vertebral artery stenosis. 4. Negative for age intracranial CTA. 5. Stable and negative Normal for age CT appearance of the brain. 6. 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   Electronically Signed   By: Lars Pinks M.D.   On: 05/04/2013 18:51   EEG Impression: this is a normal awake and asleep EEG. Please, be aware that a normal EEG does not exclude the possibility of epilepsy.   Echo: Study Conclusions  - Left ventricle: The cavity size was normal. Wall thickness was normal. Systolic function was normal. The estimated ejection fraction was in the range of 60% to 65%. Wall motion was normal; there were no regional wall motion abnormalities. Doppler parameters are consistent with abnormal left ventricular relaxation (grade 1 diastolic dysfunction). The E/e' ratio is <10, suggesting normal LV filling pressure.     MEDICATIONS                                                                                                                        Scheduled: . amLODipine  5 mg Oral Daily  . aspirin EC  81 mg Oral Daily  . atenolol  25 mg Oral Daily  . atorvastatin  80 mg Oral q1800  . benazepril  20 mg Oral Daily  . diazepam  10 mg Oral BID PC  . enoxaparin (LOVENOX) injection  40 mg Subcutaneous Q24H  . finasteride  5 mg Oral Daily  . gabapentin  800 mg Oral QHS  . mirabegron ER  25 mg Oral Daily  . pantoprazole  40 mg Oral Daily  . sodium chloride  3 mL Intravenous Q12H  . sulfaSALAzine  500 mg Oral BID  . tamsulosin  0.4 mg Oral Daily  . traZODone  150 mg Oral QHS  . venlafaxine XR  150 mg Oral Daily  . zolpidem  5 mg Oral QHS   A1c --pending LDL --133 TSH--2.050   ASSESSMENT/PLAN:  73 YO male with unstable gait and confusion. Symptoms have now  cleared. CTA shows no significant stenosis other than left ICA of 65%.  LDL is elevated at 133, EEG showed no epileptiform activity. Echo is WNL.   Recommend: 1) Continue to control stroke risk factors BP, LDL and A1c 2) Continue ASA  No further recommendations. Neurology S/O     Assessment and plan discussed with with attending physician and they are in agreement.    Etta Quill PA-C [facility] [phone]  05/05/2013, 10:40 AM

## 2013-05-06 NOTE — Progress Notes (Signed)
UR complete.  Saraia Platner RN, MSN 

## 2013-05-14 DIAGNOSIS — R3915 Urgency of urination: Secondary | ICD-10-CM | POA: Diagnosis not present

## 2013-05-14 DIAGNOSIS — R351 Nocturia: Secondary | ICD-10-CM | POA: Diagnosis not present

## 2013-05-26 DIAGNOSIS — I10 Essential (primary) hypertension: Secondary | ICD-10-CM | POA: Diagnosis not present

## 2013-05-26 DIAGNOSIS — Z9181 History of falling: Secondary | ICD-10-CM | POA: Diagnosis not present

## 2013-05-26 DIAGNOSIS — R269 Unspecified abnormalities of gait and mobility: Secondary | ICD-10-CM | POA: Diagnosis not present

## 2013-05-26 DIAGNOSIS — Z6829 Body mass index (BMI) 29.0-29.9, adult: Secondary | ICD-10-CM | POA: Diagnosis not present

## 2013-05-27 NOTE — Telephone Encounter (Signed)
Closed Encounter  °

## 2013-06-01 ENCOUNTER — Ambulatory Visit (INDEPENDENT_AMBULATORY_CARE_PROVIDER_SITE_OTHER): Payer: Medicare Other | Admitting: Neurology

## 2013-06-01 ENCOUNTER — Encounter: Payer: Self-pay | Admitting: Neurology

## 2013-06-01 VITALS — BP 109/65 | HR 64 | Ht 69.0 in | Wt 185.5 lb

## 2013-06-01 DIAGNOSIS — I701 Atherosclerosis of renal artery: Secondary | ICD-10-CM | POA: Diagnosis not present

## 2013-06-01 DIAGNOSIS — Z950 Presence of cardiac pacemaker: Secondary | ICD-10-CM

## 2013-06-01 DIAGNOSIS — I10 Essential (primary) hypertension: Secondary | ICD-10-CM

## 2013-06-01 DIAGNOSIS — K5090 Crohn's disease, unspecified, without complications: Secondary | ICD-10-CM | POA: Diagnosis not present

## 2013-06-01 DIAGNOSIS — R4182 Altered mental status, unspecified: Secondary | ICD-10-CM

## 2013-06-01 DIAGNOSIS — E782 Mixed hyperlipidemia: Secondary | ICD-10-CM

## 2013-06-01 DIAGNOSIS — I2510 Atherosclerotic heart disease of native coronary artery without angina pectoris: Secondary | ICD-10-CM | POA: Diagnosis not present

## 2013-06-01 DIAGNOSIS — I951 Orthostatic hypotension: Secondary | ICD-10-CM
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
## 2013-06-03 ENCOUNTER — Ambulatory Visit: Payer: Medicare Other | Admitting: Cardiovascular Disease

## 2013-06-04 ENCOUNTER — Ambulatory Visit (HOSPITAL_COMMUNITY)
Admission: RE | Admit: 2013-06-04 | Discharge: 2013-06-04 | Disposition: A | Discharge status: Home / Self Care | Payer: Medicare Other | Source: Ambulatory Visit | Attending: Cardiovascular Disease | Admitting: Cardiovascular Disease

## 2013-06-04 DIAGNOSIS — I6529 Occlusion and stenosis of unspecified carotid artery: Secondary | ICD-10-CM | POA: Insufficient documentation

## 2013-06-04 NOTE — Progress Notes (Signed)
Carotid Duplex Completed. °Kevin Wiley,RVT °

## 2013-06-09 ENCOUNTER — Encounter (HOSPITAL_COMMUNITY): Payer: Self-pay | Admitting: Pharmacy Technician

## 2013-06-09 ENCOUNTER — Other Ambulatory Visit: Payer: Self-pay | Admitting: *Deleted

## 2013-06-09 ENCOUNTER — Ambulatory Visit (INDEPENDENT_AMBULATORY_CARE_PROVIDER_SITE_OTHER): Payer: Medicare Other | Admitting: Cardiovascular Disease

## 2013-06-09 ENCOUNTER — Encounter: Payer: Self-pay | Admitting: Cardiovascular Disease

## 2013-06-09 VITALS — BP 146/82 | HR 68 | Resp 16 | Ht 69.0 in | Wt 199.8 lb

## 2013-06-09 DIAGNOSIS — I441 Atrioventricular block, second degree: Secondary | ICD-10-CM | POA: Diagnosis not present

## 2013-06-09 DIAGNOSIS — I2510 Atherosclerotic heart disease of native coronary artery without angina pectoris: Secondary | ICD-10-CM | POA: Diagnosis not present

## 2013-06-09 DIAGNOSIS — Z45018 Encounter for adjustment and management of other part of cardiac pacemaker: Secondary | ICD-10-CM | POA: Diagnosis not present

## 2013-06-09 DIAGNOSIS — E782 Mixed hyperlipidemia: Secondary | ICD-10-CM

## 2013-06-09 DIAGNOSIS — R5381 Other malaise: Secondary | ICD-10-CM | POA: Diagnosis not present

## 2013-06-09 DIAGNOSIS — R001 Bradycardia, unspecified: Secondary | ICD-10-CM

## 2013-06-09 DIAGNOSIS — Z45010 Encounter for checking and testing of cardiac pacemaker pulse generator [battery]: Secondary | ICD-10-CM

## 2013-06-09 DIAGNOSIS — I498 Other specified cardiac arrhythmias: Secondary | ICD-10-CM

## 2013-06-09 DIAGNOSIS — Z79899 Other long term (current) drug therapy: Secondary | ICD-10-CM | POA: Diagnosis not present

## 2013-06-09 DIAGNOSIS — D689 Coagulation defect, unspecified: Secondary | ICD-10-CM | POA: Diagnosis not present

## 2013-06-09 DIAGNOSIS — I10 Essential (primary) hypertension: Secondary | ICD-10-CM

## 2013-06-09 DIAGNOSIS — Z950 Presence of cardiac pacemaker: Secondary | ICD-10-CM

## 2013-06-09 DIAGNOSIS — R5383 Other fatigue: Secondary | ICD-10-CM

## 2013-06-09 DIAGNOSIS — I701 Atherosclerosis of renal artery: Secondary | ICD-10-CM

## 2013-06-09 DIAGNOSIS — R4182 Altered mental status, unspecified: Secondary | ICD-10-CM

## 2013-06-09 DIAGNOSIS — I6529 Occlusion and stenosis of unspecified carotid artery: Secondary | ICD-10-CM

## 2013-06-09 LAB — MDC_IDC_ENUM_SESS_TYPE_INCLINIC
Brady Statistic AP VP Percent: 1.4 %
Brady Statistic AS VP Percent: 141 %
Brady Statistic AS VS Percent: 30.6 %
Lead Channel Impedance Value: 336 Ohm
Lead Channel Impedance Value: 440 Ohm
Lead Channel Pacing Threshold Amplitude: 0.5 V
Lead Channel Pacing Threshold Amplitude: 1 V
Lead Channel Pacing Threshold Pulse Width: 0.4 ms
Lead Channel Pacing Threshold Pulse Width: 0.4 ms
Lead Channel Sensing Intrinsic Amplitude: 13.8 mV
MDC IDC MSMT BATTERY VOLTAGE: 2.81 V
MDC IDC MSMT LEADCHNL RA SENSING INTR AMPL: 5.2 mV
MDC IDC STAT BRADY AP VS PERCENT: 53.9 %

## 2013-06-09 LAB — CBC
HEMATOCRIT: 38.6 % — AB (ref 39.0–52.0)
Hemoglobin: 13.3 g/dL (ref 13.0–17.0)
MCH: 31.4 pg (ref 26.0–34.0)
MCHC: 34.5 g/dL (ref 30.0–36.0)
MCV: 91.3 fL (ref 78.0–100.0)
Platelets: 162 10*3/uL (ref 150–400)
RBC: 4.23 MIL/uL (ref 4.22–5.81)
RDW: 15.4 % (ref 11.5–15.5)
WBC: 6.4 10*3/uL (ref 4.0–10.5)

## 2013-06-09 LAB — PACEMAKER DEVICE OBSERVATION

## 2013-06-09 LAB — APTT: aPTT: 30 seconds (ref 24–37)

## 2013-06-09 LAB — PROTIME-INR
INR: 0.97 (ref <1.50)
PROTHROMBIN TIME: 12.8 s (ref 11.6–15.2)

## 2013-06-09 NOTE — Assessment & Plan Note (Signed)
No clinical evidence of this is a recurrent problem. His Doppler study showed favorable findings

## 2013-06-09 NOTE — Assessment & Plan Note (Signed)
Device at elective replacement indicator. He has symptomatic bradycardia due to both sinus node dysfunction and intermittent second-degree atrioventricular block. He also has a history of nonsustained ventricular tachycardia and will require continued treatment with beta blockers, which worsen the bradycardiac symptoms.

## 2013-06-09 NOTE — Assessment & Plan Note (Signed)
Although this would be an unusual presentation, one wonders where there is a disorientation and ataxia could be related to pacemaker syndrome (asynchronous ventricular pacing after his device reached elective replacement indicator). We were able to reprogram his device to DDD today and we'll see if this leads to improvement in his complaints. He is scheduled for a pacemaker generator change out tomorrow. This procedure has been fully reviewed with the patient and written informed consent has been obtained.
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
## 2013-06-09 NOTE — Assessment & Plan Note (Signed)
He has no symptoms of angina pectoris. He had a fairly recent nuclear stress test without reversible abnormalities. He has preserved left ventricular systolic function. He'll echocardiogram is very abnormal but has looked this way at least since 2011. It has more the appearance of hypertrophic cardiomyopathy rather than ischemia, although no left ventricular hypertrophy was described on his last echocardiogram.

## 2013-06-09 NOTE — Patient Instructions (Signed)
Your physician has recommended that you have a pacemaker battery change out.A pacemaker is a small device that is placed under the skin of your chest or abdomen to help control abnormal heart rhythms. This device uses electrical pulses to prompt the heart to beat at a normal rate. Pacemakers are used to treat heart rhythms that are too slow. Wire (leads) are attached to the pacemaker that goes into the chambers of you heart. This is done in the hospital and usually requires and overnight stay. Please see the instruction sheet given to you today for more information.  Your physician recommends that you return for lab work in: today at [facility].

## 2013-06-09 NOTE — Assessment & Plan Note (Signed)
With his total cholesterol and LDL cholesterol levels are unacceptably high and have worsened since his previous assay. His current medication list shows that he is taking Crestor at maximum dosE (No longer on colestipol). I'm not sure whether he was compliant with his medications when the labs were drawn.

## 2013-06-09 NOTE — Assessment & Plan Note (Signed)
Oral and elevated blood pressure today he notes previous problems with symptomatic orthostatic hypotension. No adjustments were made to his medications

## 2013-06-10 ENCOUNTER — Encounter (HOSPITAL_COMMUNITY)
Admission: RE | Disposition: A | Discharge status: Home / Self Care | Payer: Self-pay | Source: Ambulatory Visit | Attending: Cardiovascular Disease

## 2013-06-10 ENCOUNTER — Ambulatory Visit (HOSPITAL_COMMUNITY)
Admission: RE | Admit: 2013-06-10 | Discharge: 2013-06-10 | Disposition: A | Discharge status: Home / Self Care | Payer: Medicare Other | Source: Ambulatory Visit | Attending: Cardiovascular Disease | Admitting: Cardiovascular Disease

## 2013-06-10 DIAGNOSIS — I2510 Atherosclerotic heart disease of native coronary artery without angina pectoris: Secondary | ICD-10-CM | POA: Insufficient documentation

## 2013-06-10 DIAGNOSIS — Z45018 Encounter for adjustment and management of other part of cardiac pacemaker: Secondary | ICD-10-CM | POA: Insufficient documentation

## 2013-06-10 DIAGNOSIS — I739 Peripheral vascular disease, unspecified: Secondary | ICD-10-CM | POA: Insufficient documentation

## 2013-06-10 DIAGNOSIS — I10 Essential (primary) hypertension: Secondary | ICD-10-CM | POA: Insufficient documentation

## 2013-06-10 DIAGNOSIS — F411 Generalized anxiety disorder: Secondary | ICD-10-CM | POA: Diagnosis not present

## 2013-06-10 DIAGNOSIS — I441 Atrioventricular block, second degree: Secondary | ICD-10-CM

## 2013-06-10 DIAGNOSIS — E782 Mixed hyperlipidemia: Secondary | ICD-10-CM | POA: Insufficient documentation

## 2013-06-10 DIAGNOSIS — F4310 Post-traumatic stress disorder, unspecified: Secondary | ICD-10-CM | POA: Diagnosis not present

## 2013-06-10 DIAGNOSIS — Z87891 Personal history of nicotine dependence: Secondary | ICD-10-CM | POA: Insufficient documentation

## 2013-06-10 DIAGNOSIS — Z7982 Long term (current) use of aspirin: Secondary | ICD-10-CM | POA: Insufficient documentation

## 2013-06-10 DIAGNOSIS — F3289 Other specified depressive episodes: Secondary | ICD-10-CM | POA: Diagnosis not present

## 2013-06-10 DIAGNOSIS — Z9861 Coronary angioplasty status: Secondary | ICD-10-CM | POA: Insufficient documentation

## 2013-06-10 DIAGNOSIS — I4891 Unspecified atrial fibrillation: Secondary | ICD-10-CM | POA: Insufficient documentation

## 2013-06-10 DIAGNOSIS — I495 Sick sinus syndrome: Secondary | ICD-10-CM | POA: Insufficient documentation

## 2013-06-10 DIAGNOSIS — F329 Major depressive disorder, single episode, unspecified: Secondary | ICD-10-CM | POA: Insufficient documentation

## 2013-06-10 DIAGNOSIS — N400 Enlarged prostate without lower urinary tract symptoms: Secondary | ICD-10-CM | POA: Diagnosis not present

## 2013-06-10 DIAGNOSIS — K5090 Crohn's disease, unspecified, without complications: Secondary | ICD-10-CM | POA: Insufficient documentation

## 2013-06-10 DIAGNOSIS — I701 Atherosclerosis of renal artery: Secondary | ICD-10-CM | POA: Diagnosis not present

## 2013-06-10 DIAGNOSIS — Z45010 Encounter for checking and testing of cardiac pacemaker pulse generator [battery]: Secondary | ICD-10-CM

## 2013-06-10 DIAGNOSIS — K219 Gastro-esophageal reflux disease without esophagitis: Secondary | ICD-10-CM | POA: Insufficient documentation

## 2013-06-10 HISTORY — PX: PERMANENT PACEMAKER GENERATOR CHANGE: SHX6022

## 2013-06-10 HISTORY — DX: Atrioventricular block, second degree: I44.1

## 2013-06-10 HISTORY — DX: Sick sinus syndrome: I49.5

## 2013-06-10 LAB — BASIC METABOLIC PANEL
BUN: 9 mg/dL (ref 6–23)
CALCIUM: 9.2 mg/dL (ref 8.4–10.5)
CO2: 28 mEq/L (ref 19–32)
Chloride: 106 mEq/L (ref 96–112)
Creat: 0.95 mg/dL (ref 0.50–1.35)
Glucose, Bld: 80 mg/dL (ref 70–99)
POTASSIUM: 4.5 meq/L (ref 3.5–5.3)
SODIUM: 143 meq/L (ref 135–145)

## 2013-06-10 LAB — SURGICAL PCR SCREEN
MRSA, PCR: NEGATIVE
STAPHYLOCOCCUS AUREUS: NEGATIVE

## 2013-06-10 SURGERY — PERMANENT PACEMAKER GENERATOR CHANGE
Anesthesia: LOCAL

## 2013-06-10 MED ORDER — MIDAZOLAM HCL 5 MG/5ML IJ SOLN
INTRAMUSCULAR | Status: AC
  Filled 2013-06-10: qty 5

## 2013-06-10 MED ORDER — ONDANSETRON HCL 4 MG/2ML IJ SOLN: 4.0000 mg | Freq: Four times a day (QID) | INTRAMUSCULAR | Status: DC | PRN

## 2013-06-10 MED ORDER — DIAZEPAM 5 MG PO TABS
10.0000 mg | ORAL_TABLET | Freq: Once | ORAL | Status: AC
  Administered 2013-06-10: 10 mg via ORAL

## 2013-06-10 MED ORDER — FENTANYL CITRATE 0.05 MG/ML IJ SOLN
INTRAMUSCULAR | Status: AC
  Filled 2013-06-10: qty 2

## 2013-06-10 MED ORDER — CEFAZOLIN SODIUM-DEXTROSE 2-3 GM-% IV SOLR
2.0000 g | INTRAVENOUS | Status: DC
  Filled 2013-06-10: qty 50

## 2013-06-10 MED ORDER — LIDOCAINE HCL (PF) 1 % IJ SOLN
INTRAMUSCULAR | Status: AC
  Filled 2013-06-10: qty 60

## 2013-06-10 MED ORDER — SODIUM CHLORIDE 0.9 % IJ SOLN: 3.0000 mL | INTRAMUSCULAR | Status: DC | PRN

## 2013-06-10 MED ORDER — SODIUM CHLORIDE 0.9 % IV SOLN
INTRAVENOUS | Status: DC
  Administered 2013-06-10: 11:00:00 via INTRAVENOUS

## 2013-06-10 MED ORDER — MUPIROCIN 2 % EX OINT
TOPICAL_OINTMENT | CUTANEOUS | Status: AC
  Filled 2013-06-10: qty 22

## 2013-06-10 MED ORDER — SODIUM CHLORIDE 0.9 % IR SOLN
80.0000 mg | Status: DC
  Filled 2013-06-10 (×2): qty 2

## 2013-06-10 MED ORDER — MUPIROCIN 2 % EX OINT
TOPICAL_OINTMENT | Freq: Two times a day (BID) | CUTANEOUS | Status: DC
  Administered 2013-06-10: 11:00:00 via NASAL
  Filled 2013-06-10: qty 22

## 2013-06-10 MED ORDER — ACETAMINOPHEN 325 MG PO TABS: 325.0000 mg | ORAL_TABLET | ORAL | Status: DC | PRN

## 2013-06-10 MED ORDER — HEPARIN (PORCINE) IN NACL 2-0.9 UNIT/ML-% IJ SOLN
INTRAMUSCULAR | Status: AC
  Filled 2013-06-10: qty 500

## 2013-06-10 MED ORDER — DIAZEPAM 5 MG PO TABS
ORAL_TABLET | ORAL | Status: AC
  Filled 2013-06-10: qty 2
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
## 2013-06-10 NOTE — Discharge Instructions (Signed)
Pacemaker Battery Change, Care After  °Refer to this sheet in the next few weeks. These instructions provide you with information on caring for yourself after your procedure. Your health care provider may also give you more specific instructions. Your treatment has been planned according to current medical practices, but problems sometimes occur. Call your health care provider if you have any problems or questions after your procedure. °WHAT TO EXPECT AFTER THE PROCEDURE °After your procedure, it is typical to have the following sensations: °· Soreness at the pacemaker site. °HOME CARE INSTRUCTIONS  °· Keep the incision clean and dry. °· Unless advised otherwise, you may shower beginning 48 hours after your procedure. °· For the first week after the replacement, avoid stretching motions that pull at the incision site and avoid heavy exercise with the arm on the same side as the incision. °· Only take over-the-counter or prescription medicines for pain, discomfort, or fever as directed by your health care provider. °· Your health care provider will tell you when you will need to next test your pacemaker by telephone or when to return to the office for follow up for removal of stitches. °SEEK MEDICAL CARE IF:  °· You have pain at the incision site that is not relieved by over-the-counter or prescription medicine. °· There is drainage or pus from the incision site. °· There is swelling larger than a lime at the incision site. °· You develop red streaking that extends above or below the incision site. °· You feel brief, intermittent palpitations, lightheadedness, or any symptoms that you feel might be related to your heart. °SEEK IMMEDIATE MEDICAL CARE IF:  °· You experience chest pain that is different than the pain at the pacemaker site. °· Shortness of breath. °· Palpitations or irregular heart beat. °· Lightheadedness that does not go away quickly. °· Fainting. °· You have pain that gets worse and is not relieved by  medicine. °MAKE SURE YOU:  °· Understand these instructions. °· Will watch your condition. °· Will get help right away if you are not doing well or get worse. °Document Released: 11/11/2012 Document Reviewed: 08/05/2012 °ExitCare® Patient Information ©2014 [employer]. ° °

## 2013-06-10 NOTE — Interval H&P Note (Signed)
History and Physical Interval Note:  06/10/2013 10:57 AM  [name]  has presented today for surgery, with the diagnosis of dead battery  The various methods of treatment have been discussed with the patient and family. After consideration of risks, benefits and other options for treatment, the patient has consented to  Procedure(s): PERMANENT PACEMAKER GENERATOR CHANGE (N/A) as a surgical intervention .  The patient's history has been reviewed, patient examined, no change in status, stable for surgery.  I have reviewed the patient's chart and labs.  Questions were answered to the patient's satisfaction.     Maelle Sheaffer

## 2013-06-10 NOTE — CV Procedure (Signed)
Procedure report  Procedure performed:  1. Dual chamber pacemaker generator changeout  2. Light sedation  Reason for procedure:  1. Device generator at elective replacement interval  2. Symptomatic bradycardia due to sinus node dysfunction and second degree AV block Procedure performed by:  Sanda Klein, MD  Complications:  None  Estimated blood loss:  <5 mL  Medications administered during procedure:  Ancef 2 g intravenously,lidocaine 1% 30 mL locally, fentanyl 25 mcg intravenously, Versed 2 mg intravenously Device details:   New Generator Medtronic Adapta  model number [id number], serial number [id number] Right atrial lead (chronic) Medtronic, model number 8101, serial [id number] (implanted 10/10/2005) Right ventricular lead (chronic)  Medtronic, model number [id number], serial number [id number] (implanted 10/10/2005)  Explanted generator Medtronic EnRhythm,  model number [id number], serial number  [id number] (implanted 10/10/2005)  Procedure details:  After the risks and benefits of the procedure were discussed the patient provided informed consent. She was brought to the cardiac catheter lab in the fasting state. The patient was prepped and draped in usual sterile fashion. Local anesthesia with 1% lidocaine was administered to to the left infraclavicular area. A 5-6cm horizontal incision was made parallel with and 2-3 cm caudal to the right clavicle, in the area of an old scar. An older scar was seen closer to the clavicle. Using minimal electrocautery and mostly sharp and blunt dissection the prepectoral pocket was opened carefully to avoid injury to the loops of chronic leads. Extensive dissection was not necessary. The device was explanted. The pocket was carefully inspected for hemostasis and flushed with copious amounts of antibiotic solution.  The leads were disconnected from the old generator and testing of the lead parameters  showed excellent values. The new generator was connected to the  chronic leads, with appropriate pacing noted.   The entire system was then carefully inserted in the pocket with care been taking that the leads and device assumed a comfortable position without pressure on the incision. Great care was taken that the leads be located deep to the generator. The pocket was then closed in layers using 2 layers of 2-0 Vicryl and cutaneous staples after which a sterile dressing was applied.   At the end of the procedure the following lead parameters were encountered:   Right atrial lead sensed P waves 5.9 mV, impedance 399 ohms, threshold 0.4 at 0.5 ms pulse width.  Right ventricular lead sensed R waves  23.4 mV, impedance 554 ohms, threshold 0.5 at 0.5 ms pulse width.   Sanda Klein, MD, [facility] [phone] office [phone] pager

## 2013-06-11 ENCOUNTER — Telehealth: Payer: Self-pay | Admitting: Cardiovascular Disease

## 2013-06-11 NOTE — Telephone Encounter (Signed)
RN reviewed other information on site care.  Remove dressing the day after and keep site dry.  Confirmed w/ Ivin Booty, RN.  Call back to pt and informed.  Pt verbalized understanding and agreed w/ plan.

## 2013-06-11 NOTE — Telephone Encounter (Signed)
Returned call and pt verified x 2.  Pt stated he and his wife were so exhausted yesterday that they cannot remember what Dr. Loletha Grayer told them.  Stated he gave them verbal instructions about the dressing changes, but they just cannot remember.  Pt stated he has staples and gauze pad over it.    Pt advised to change gauze at least once daily and monitor site for signs of infection, which pt verbalized, and wash hands before and after changing gauze.  Pt also advised to keep appt on Tuesday w/ Dr. Loletha Grayer for evaluation and check of pacer.  Pt stated he knows he isn't supposed to exercise or lift his arm up.  Pt agreed to call back immediately to report any signs of infection.  After hours/Weekend call instructions given.  Pt verbalized understanding and agreed w/ plan.

## 2013-06-11 NOTE — Telephone Encounter (Signed)
Is wanting to speak to someone about the instructions on when to change his bandages from his procedure on yesterday .Marland Kitchen Please call the cell phone .Marland Kitchen [phone]   Thanks

## 2013-06-14 ENCOUNTER — Ambulatory Visit (INDEPENDENT_AMBULATORY_CARE_PROVIDER_SITE_OTHER): Payer: Medicare Other | Admitting: Neurology

## 2013-06-14 ENCOUNTER — Encounter (INDEPENDENT_AMBULATORY_CARE_PROVIDER_SITE_OTHER): Payer: Self-pay

## 2013-06-14 ENCOUNTER — Encounter (INDEPENDENT_AMBULATORY_CARE_PROVIDER_SITE_OTHER): Payer: Self-pay | Admitting: Radiology

## 2013-06-14 DIAGNOSIS — E782 Mixed hyperlipidemia: Secondary | ICD-10-CM

## 2013-06-14 DIAGNOSIS — R4182 Altered mental status, unspecified: Secondary | ICD-10-CM

## 2013-06-14 DIAGNOSIS — R209 Unspecified disturbances of skin sensation: Secondary | ICD-10-CM | POA: Diagnosis not present

## 2013-06-14 DIAGNOSIS — I2510 Atherosclerotic heart disease of native coronary artery without angina pectoris: Secondary | ICD-10-CM

## 2013-06-14 DIAGNOSIS — R269 Unspecified abnormalities of gait and mobility: Secondary | ICD-10-CM | POA: Diagnosis not present

## 2013-06-14 DIAGNOSIS — G629 Polyneuropathy, unspecified: Secondary | ICD-10-CM | POA: Insufficient documentation

## 2013-06-14 DIAGNOSIS — G609 Hereditary and idiopathic neuropathy, unspecified: Secondary | ICD-10-CM | POA: Diagnosis not present

## 2013-06-14 DIAGNOSIS — I10 Essential (primary) hypertension: Secondary | ICD-10-CM

## 2013-06-14 DIAGNOSIS — I701 Atherosclerosis of renal artery: Secondary | ICD-10-CM

## 2013-06-14 DIAGNOSIS — K5090 Crohn's disease, unspecified, without complications: Secondary | ICD-10-CM

## 2013-06-14 DIAGNOSIS — I951 Orthostatic hypotension: Secondary | ICD-10-CM

## 2013-06-14 DIAGNOSIS — Z0289 Encounter for other administrative examinations: Secondary | ICD-10-CM

## 2013-06-14 DIAGNOSIS — Z950 Presence of cardiac pacemaker: Secondary | ICD-10-CM

## 2013-06-14 NOTE — Procedures (Signed)
   NCS (NERVE CONDUCTION STUDY) WITH EMG (ELECTROMYOGRAPHY) REPORT   STUDY DATE: May 11th 2015 PATIENT NAME: Kevin Wiley DOB: Jul 03, 1940 MRN: [id number]    TECHNOLOGIST: Towana Badger  ELECTROMYOGRAPHER: Marcial Pacas M.D.  CLINICAL INFORMATION: 73 years old gentleman, with history of pacemaker, chronic low back pain, presenting with gradual onset bilateral feet paresthesia, worsening gait difficulty  FINDINGS: NERVE CONDUCTION STUDY: Bilateral sural sensory responses were absent. Bilateral tibial responses, left peroneal EDB motor responses, were normal. Right peroneal to EDB motor response showed a mildly decreased C. map amplitude, mildly slow conduction velocity.  Bilateral tibial H. reflexes were present, right side is 1 point 2 ms prolonged compared to her left side.  Left median, ulnar sensory and motor responses were normal  NEEDLE ELECTROMYOGRAPHY: Selected needle examination was performed at bilateral lower extremity muscles, bilateral lumbosacral paraspinal muscles, right upper extremity muscles  Bilateral tibialis interior, tibialis posterior, medial gastrocnemius, increased insertion activity, no spontaneous activity, mildly enlarged motor unit potential, with mildly decreased recruitment patterns.  Bilateral vastus lateralis, normally insertion activity, no spontaneous activity, normal morphology motor unit potential, with mildly decreased recruitment patterns  There was no spontaneous activity at bilateral lumbosacral paraspinal muscles, bilateral L4, L5, S1   Right extensor digitorum communis, pronator teres, normally insertion activity, no spontaneous activity, Mildly enlarged motor unit potential, with mildly decreased recruitment patterns   IMPRESSION:  This is an abnormal study. There is electrodiagnostic evidence of mild length dependent axonal peripheral neuropathy, there is also evidence of chronic neuropathic changes involving bilateral lumbosacral myotomes, mainly  bilateral L4, L5, S1   INTERPRETING PHYSICIAN:   Marcial Pacas M.D. Ph.D. [facility] [address] [place], [place] [address] [phone]

## 2013-06-15 ENCOUNTER — Ambulatory Visit (INDEPENDENT_AMBULATORY_CARE_PROVIDER_SITE_OTHER): Payer: Medicare Other | Admitting: Cardiovascular Disease

## 2013-06-15 ENCOUNTER — Encounter: Payer: Self-pay | Admitting: Cardiovascular Disease

## 2013-06-15 VITALS — BP 175/100 | HR 80 | Ht 69.0 in | Wt 196.3 lb

## 2013-06-15 DIAGNOSIS — Z950 Presence of cardiac pacemaker: Secondary | ICD-10-CM

## 2013-06-15 NOTE — Progress Notes (Signed)
Patient ID: Kevin Wiley, male   DOB: Jun 27, 1940, 73 y.o.   MRN: [id number]  Here for device wound check, site looks healthy. It is too soon to remove staples. Return on Friday

## 2013-06-18 ENCOUNTER — Encounter: Payer: Self-pay | Admitting: Cardiovascular Disease

## 2013-06-18 ENCOUNTER — Ambulatory Visit (INDEPENDENT_AMBULATORY_CARE_PROVIDER_SITE_OTHER): Payer: Medicare Other | Admitting: Cardiovascular Disease

## 2013-06-18 VITALS — BP 123/79 | HR 60 | Ht 69.0 in | Wt 196.5 lb

## 2013-06-18 DIAGNOSIS — I441 Atrioventricular block, second degree: Secondary | ICD-10-CM

## 2013-06-18 DIAGNOSIS — I498 Other specified cardiac arrhythmias: Secondary | ICD-10-CM | POA: Diagnosis not present

## 2013-06-18 DIAGNOSIS — Z950 Presence of cardiac pacemaker: Secondary | ICD-10-CM

## 2013-06-18 DIAGNOSIS — R001 Bradycardia, unspecified: Secondary | ICD-10-CM

## 2013-06-18 LAB — PACEMAKER DEVICE OBSERVATION

## 2013-06-18 NOTE — Patient Instructions (Signed)
Remote monitoring is used to monitor your Pacemaker or ICD from home. This monitoring reduces the number of office visits required to check your device to one time per year. It allows [place] to monitor the functioning of your device to ensure it is working properly. You are scheduled for a device check from home on September 19, 2013. You may send your transmission at any time that day. If you have a wireless device, the transmission will be sent automatically. After your physician reviews your transmission, you will receive a postcard with your next transmission date.  Dr. Sallyanne Kuster recommends that you schedule a follow-up appointment in: 6 MONTHS.

## 2013-06-18 NOTE — Progress Notes (Signed)
Patient ID: [name], male   DOB: 1940-11-29, 73 y.o.   MRN: [id number]  [place] is here today for a pacemaker site check after generator change out performed one week ago. The surgical site looks healthy. The staples were removed. There is no evidence of swelling, drainage, redness or warmth. Has a small superficial ecchymosis but no evidence of pocket hematoma. The staples were removed and a clean dressing was applied. He will have a remote [employer] pacemaker check in 3 months and then off his visit in 6 months Sanda Klein, MD, [place] [phone] office [phone] pager

## 2013-06-19 LAB — MDC_IDC_ENUM_SESS_TYPE_INCLINIC
Brady Statistic AP VP Percent: 18 %
Brady Statistic AP VS Percent: 40.2 %
Brady Statistic AS VP Percent: 0.6 %
Brady Statistic AS VS Percent: 41.1 %
Lead Channel Impedance Value: 410 Ohm
Lead Channel Impedance Value: 614 Ohm
Lead Channel Pacing Threshold Amplitude: 0.5 V
Lead Channel Pacing Threshold Amplitude: 0.75 V
Lead Channel Pacing Threshold Pulse Width: 0.4 ms
Lead Channel Sensing Intrinsic Amplitude: 22.4 mV
Lead Channel Sensing Intrinsic Amplitude: 5.6 mV
Lead Channel Setting Pacing Amplitude: 1.5 V
Lead Channel Setting Pacing Amplitude: 2 V
Lead Channel Setting Pacing Pulse Width: 0.4 ms
MDC IDC MSMT BATTERY IMPEDANCE: 100 Ohm
MDC IDC MSMT BATTERY VOLTAGE: 2.8 V
MDC IDC MSMT LEADCHNL RV PACING THRESHOLD PULSEWIDTH: 0.4 ms
MDC IDC SET LEADCHNL RV SENSING SENSITIVITY: 0.9 mV
MDC IDC SET ZONE DETECTION INTERVAL: 350 ms
MDC IDC SET ZONE DETECTION INTERVAL: 370 ms

## 2013-06-24 ENCOUNTER — Encounter: Payer: Self-pay | Admitting: Cardiovascular Disease

## 2013-06-29 ENCOUNTER — Telehealth: Payer: Self-pay | Admitting: Neurology
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
## 2013-06-30 NOTE — Telephone Encounter (Signed)
Hinton Dyer, Please give him a follow up appt in my next available.

## 2013-06-30 NOTE — Telephone Encounter (Signed)
Pt calling stating that both of his legs gave away and having back pain and would like to know if he can come in sooner than July to see Dr. Krista Blue. Pt has an appt on 08/16/13. Please advise

## 2013-07-06 NOTE — Telephone Encounter (Signed)
Patient called me back and he stated he will keep his follow with Dr.Yan in July. Dr. Sherwood Gambler  Is giving him Epidural injection this Monday. 07-12-2013.

## 2013-07-12 DIAGNOSIS — M5430 Sciatica, unspecified side: Secondary | ICD-10-CM | POA: Diagnosis not present

## 2013-07-12 DIAGNOSIS — M5137 Other intervertebral disc degeneration, lumbosacral region: Secondary | ICD-10-CM | POA: Diagnosis not present

## 2013-07-12 DIAGNOSIS — M48062 Spinal stenosis, lumbar region with neurogenic claudication: Secondary | ICD-10-CM | POA: Diagnosis not present

## 2013-07-12 DIAGNOSIS — M47817 Spondylosis without myelopathy or radiculopathy, lumbosacral region: Secondary | ICD-10-CM | POA: Diagnosis not present

## 2013-07-30 ENCOUNTER — Ambulatory Visit: Payer: Medicare Other | Attending: Neurology | Admitting: Physical Therapy

## 2013-07-30 DIAGNOSIS — IMO0001 Reserved for inherently not codable concepts without codable children: Secondary | ICD-10-CM | POA: Insufficient documentation

## 2013-07-30 DIAGNOSIS — R269 Unspecified abnormalities of gait and mobility: Secondary | ICD-10-CM | POA: Insufficient documentation

## 2013-07-30 DIAGNOSIS — IMO0002 Reserved for concepts with insufficient information to code with codable children: Secondary | ICD-10-CM | POA: Insufficient documentation

## 2013-07-30 DIAGNOSIS — Z9181 History of falling: Secondary | ICD-10-CM | POA: Diagnosis not present

## 2013-08-10 DIAGNOSIS — M47817 Spondylosis without myelopathy or radiculopathy, lumbosacral region: Secondary | ICD-10-CM | POA: Diagnosis not present

## 2013-08-10 DIAGNOSIS — IMO0002 Reserved for concepts with insufficient information to code with codable children: Secondary | ICD-10-CM | POA: Diagnosis not present

## 2013-08-10 DIAGNOSIS — M5137 Other intervertebral disc degeneration, lumbosacral region: Secondary | ICD-10-CM | POA: Diagnosis not present

## 2013-08-10 DIAGNOSIS — M48062 Spinal stenosis, lumbar region with neurogenic claudication: Secondary | ICD-10-CM | POA: Diagnosis not present

## 2013-08-12 ENCOUNTER — Ambulatory Visit: Payer: Medicare Other | Attending: Neurology | Admitting: Physical Therapy

## 2013-08-12 DIAGNOSIS — R269 Unspecified abnormalities of gait and mobility: Secondary | ICD-10-CM | POA: Diagnosis not present

## 2013-08-12 DIAGNOSIS — IMO0001 Reserved for inherently not codable concepts without codable children: Secondary | ICD-10-CM | POA: Diagnosis not present

## 2013-08-12 DIAGNOSIS — IMO0002 Reserved for concepts with insufficient information to code with codable children: Secondary | ICD-10-CM | POA: Insufficient documentation

## 2013-08-12 DIAGNOSIS — Z9181 History of falling: Secondary | ICD-10-CM | POA: Diagnosis not present

## 2013-08-16 ENCOUNTER — Encounter: Payer: Self-pay | Admitting: Neurology

## 2013-08-16 ENCOUNTER — Ambulatory Visit (INDEPENDENT_AMBULATORY_CARE_PROVIDER_SITE_OTHER): Payer: Medicare Other | Admitting: Neurology

## 2013-08-16 ENCOUNTER — Encounter (INDEPENDENT_AMBULATORY_CARE_PROVIDER_SITE_OTHER): Payer: Self-pay

## 2013-08-16 VITALS — BP 152/100 | HR 56 | Ht 69.0 in | Wt 194.0 lb

## 2013-08-16 DIAGNOSIS — M545 Low back pain, unspecified: Secondary | ICD-10-CM | POA: Diagnosis not present

## 2013-08-16 DIAGNOSIS — I6529 Occlusion and stenosis of unspecified carotid artery: Secondary | ICD-10-CM | POA: Diagnosis not present

## 2013-08-16 DIAGNOSIS — G629 Polyneuropathy, unspecified: Secondary | ICD-10-CM

## 2013-08-16 DIAGNOSIS — M542 Cervicalgia: Secondary | ICD-10-CM | POA: Diagnosis not present

## 2013-08-16 DIAGNOSIS — R269 Unspecified abnormalities of gait and mobility: Secondary | ICD-10-CM

## 2013-08-16 DIAGNOSIS — G609 Hereditary and idiopathic neuropathy, unspecified: Secondary | ICD-10-CM
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
## 2013-08-18 ENCOUNTER — Ambulatory Visit: Payer: Medicare Other | Admitting: Physical Therapy

## 2013-08-26 ENCOUNTER — Ambulatory Visit: Payer: Medicare Other | Admitting: Physical Therapy

## 2013-08-31 ENCOUNTER — Ambulatory Visit: Payer: Medicare Other | Admitting: Physical Therapy

## 2013-09-08 DIAGNOSIS — R197 Diarrhea, unspecified: Secondary | ICD-10-CM | POA: Diagnosis not present

## 2013-09-09 ENCOUNTER — Other Ambulatory Visit: Payer: Self-pay | Admitting: Gastroenterology

## 2013-09-09 DIAGNOSIS — K5090 Crohn's disease, unspecified, without complications: Secondary | ICD-10-CM | POA: Diagnosis not present

## 2013-09-09 DIAGNOSIS — K5901 Slow transit constipation: Secondary | ICD-10-CM | POA: Diagnosis not present

## 2013-09-09 DIAGNOSIS — K50119 Crohn's disease of large intestine with unspecified complications: Secondary | ICD-10-CM

## 2013-09-09 DIAGNOSIS — R109 Unspecified abdominal pain: Secondary | ICD-10-CM | POA: Diagnosis not present

## 2013-09-09 DIAGNOSIS — K5010 Crohn's disease of large intestine without complications: Secondary | ICD-10-CM | POA: Diagnosis not present

## 2013-09-09 DIAGNOSIS — R1084 Generalized abdominal pain: Secondary | ICD-10-CM

## 2013-09-14 ENCOUNTER — Ambulatory Visit
Admission: RE | Admit: 2013-09-14 | Discharge: 2013-09-14 | Disposition: A | Discharge status: Home / Self Care | Payer: Medicare Other | Source: Ambulatory Visit | Attending: Gastroenterology | Admitting: Gastroenterology

## 2013-09-14 DIAGNOSIS — K50119 Crohn's disease of large intestine with unspecified complications: Secondary | ICD-10-CM

## 2013-09-14 DIAGNOSIS — R1084 Generalized abdominal pain: Secondary | ICD-10-CM

## 2013-09-14 DIAGNOSIS — N281 Cyst of kidney, acquired: Secondary | ICD-10-CM | POA: Diagnosis not present

## 2013-09-14 MED ORDER — IOHEXOL 300 MG/ML  SOLN
100.0000 mL | Freq: Once | INTRAMUSCULAR | Status: AC | PRN
  Administered 2013-09-14: 100 mL via INTRAVENOUS

## 2013-09-20 ENCOUNTER — Ambulatory Visit (INDEPENDENT_AMBULATORY_CARE_PROVIDER_SITE_OTHER): Payer: Medicare Other | Admitting: *Deleted

## 2013-09-20 DIAGNOSIS — I498 Other specified cardiac arrhythmias: Secondary | ICD-10-CM | POA: Diagnosis not present

## 2013-09-20 DIAGNOSIS — I441 Atrioventricular block, second degree: Secondary | ICD-10-CM | POA: Diagnosis not present

## 2013-09-20 DIAGNOSIS — R001 Bradycardia, unspecified: Secondary | ICD-10-CM

## 2013-09-20 NOTE — Progress Notes (Signed)
Remote pacemaker transmission.   

## 2013-09-22 LAB — MDC_IDC_ENUM_SESS_TYPE_REMOTE
Brady Statistic AP VP Percent: 0.1 %
Brady Statistic AP VS Percent: 66.5 %
Brady Statistic AS VS Percent: 33.4 %
Lead Channel Impedance Value: 405 Ohm
Lead Channel Impedance Value: 592 Ohm
Lead Channel Pacing Threshold Amplitude: 0.625 V
Lead Channel Pacing Threshold Pulse Width: 0.4 ms
Lead Channel Pacing Threshold Pulse Width: 0.4 ms
Lead Channel Sensing Intrinsic Amplitude: 22.4 mV
MDC IDC MSMT LEADCHNL RA PACING THRESHOLD AMPLITUDE: 0.5 V
MDC IDC MSMT LEADCHNL RA SENSING INTR AMPL: 2.8 mV
MDC IDC STAT BRADY AS VP PERCENT: 0.1 %

## 2013-09-28 ENCOUNTER — Encounter: Payer: Self-pay | Admitting: Cardiology

## 2013-09-28 DIAGNOSIS — I789 Disease of capillaries, unspecified: Secondary | ICD-10-CM | POA: Diagnosis not present

## 2013-09-28 DIAGNOSIS — L719 Rosacea, unspecified: Secondary | ICD-10-CM | POA: Diagnosis not present

## 2013-09-28 DIAGNOSIS — Z85828 Personal history of other malignant neoplasm of skin: Secondary | ICD-10-CM | POA: Diagnosis not present

## 2013-10-04 ENCOUNTER — Encounter: Payer: Self-pay | Admitting: Cardiovascular Disease

## 2013-10-27 DIAGNOSIS — I10 Essential (primary) hypertension: Secondary | ICD-10-CM | POA: Diagnosis not present

## 2013-10-27 DIAGNOSIS — E785 Hyperlipidemia, unspecified: Secondary | ICD-10-CM | POA: Diagnosis not present

## 2013-10-27 DIAGNOSIS — Z125 Encounter for screening for malignant neoplasm of prostate: Secondary | ICD-10-CM | POA: Diagnosis not present

## 2013-11-03 DIAGNOSIS — F4310 Post-traumatic stress disorder, unspecified: Secondary | ICD-10-CM | POA: Diagnosis not present

## 2013-11-03 DIAGNOSIS — Z0000 Encounter for general adult medical examination without abnormal findings: Secondary | ICD-10-CM | POA: Diagnosis not present

## 2013-11-03 DIAGNOSIS — E785 Hyperlipidemia, unspecified: Secondary | ICD-10-CM | POA: Diagnosis not present

## 2013-11-03 DIAGNOSIS — I2510 Atherosclerotic heart disease of native coronary artery without angina pectoris: Secondary | ICD-10-CM | POA: Diagnosis not present

## 2013-11-03 DIAGNOSIS — Z6829 Body mass index (BMI) 29.0-29.9, adult: Secondary | ICD-10-CM | POA: Diagnosis not present

## 2013-11-03 DIAGNOSIS — I10 Essential (primary) hypertension: Secondary | ICD-10-CM | POA: Diagnosis not present

## 2013-11-03 DIAGNOSIS — Z125 Encounter for screening for malignant neoplasm of prostate: Secondary | ICD-10-CM | POA: Diagnosis not present

## 2013-11-03 DIAGNOSIS — Z23 Encounter for immunization: Secondary | ICD-10-CM | POA: Diagnosis not present

## 2013-11-16 ENCOUNTER — Encounter: Payer: Self-pay | Admitting: Cardiovascular Disease

## 2013-12-02 DIAGNOSIS — M47816 Spondylosis without myelopathy or radiculopathy, lumbar region: Secondary | ICD-10-CM | POA: Diagnosis not present

## 2013-12-02 DIAGNOSIS — M4806 Spinal stenosis, lumbar region: Secondary | ICD-10-CM | POA: Diagnosis not present

## 2013-12-02 DIAGNOSIS — M5136 Other intervertebral disc degeneration, lumbar region: Secondary | ICD-10-CM | POA: Diagnosis not present

## 2013-12-07 ENCOUNTER — Encounter: Payer: Self-pay | Admitting: Cardiovascular Disease

## 2013-12-07 ENCOUNTER — Ambulatory Visit (INDEPENDENT_AMBULATORY_CARE_PROVIDER_SITE_OTHER): Payer: Medicare Other | Admitting: Cardiovascular Disease

## 2013-12-07 VITALS — BP 142/80 | HR 85 | Resp 16 | Ht 69.0 in | Wt 193.0 lb

## 2013-12-07 DIAGNOSIS — Z950 Presence of cardiac pacemaker: Secondary | ICD-10-CM

## 2013-12-07 DIAGNOSIS — R001 Bradycardia, unspecified: Secondary | ICD-10-CM | POA: Diagnosis not present

## 2013-12-07 DIAGNOSIS — I2510 Atherosclerotic heart disease of native coronary artery without angina pectoris: Secondary | ICD-10-CM | POA: Diagnosis not present

## 2013-12-07 DIAGNOSIS — I441 Atrioventricular block, second degree: Secondary | ICD-10-CM

## 2013-12-07 DIAGNOSIS — I6529 Occlusion and stenosis of unspecified carotid artery: Secondary | ICD-10-CM

## 2013-12-07 LAB — MDC_IDC_ENUM_SESS_TYPE_INCLINIC
Battery Impedance: 100 Ohm
Battery Remaining Longevity: 162 mo
Battery Voltage: 2.8 V
Brady Statistic AP VP Percent: 0 %
Brady Statistic AP VS Percent: 69 %
Brady Statistic AS VP Percent: 0 %
Brady Statistic AS VS Percent: 30 %
Date Time Interrogation Session: 20151103153747
Lead Channel Impedance Value: 417 Ohm
Lead Channel Impedance Value: 663 Ohm
Lead Channel Pacing Threshold Amplitude: 0.75 V
Lead Channel Pacing Threshold Pulse Width: 0.4 ms
Lead Channel Pacing Threshold Pulse Width: 0.4 ms
Lead Channel Sensing Intrinsic Amplitude: 4 mV
Lead Channel Setting Pacing Amplitude: 2 V
MDC IDC MSMT LEADCHNL RV PACING THRESHOLD AMPLITUDE: 0.5 V
MDC IDC MSMT LEADCHNL RV SENSING INTR AMPL: 22.4 mV
MDC IDC SET LEADCHNL RA PACING AMPLITUDE: 1.5 V
MDC IDC SET LEADCHNL RV PACING PULSEWIDTH: 0.4 ms
MDC IDC SET LEADCHNL RV SENSING SENSITIVITY: 5.6 mV

## 2013-12-07 MED ORDER — ATENOLOL 50 MG PO TABS: 50.0000 mg | ORAL_TABLET | Freq: Every day | ORAL | Status: DC
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
## 2013-12-07 NOTE — Patient Instructions (Addendum)
INCREASE Atenolol to 50mg  daily.  Remote monitoring is used to monitor your pacemaker from home. This monitoring reduces the number of office visits required to check your device to one time per year. It allows [place] to keep an eye on the functioning of your device to ensure it is working properly. You are scheduled for a device check from home on 03-10-2014. You may send your transmission at any time that day. If you have a wireless device, the transmission will be sent automatically. After your physician reviews your transmission, you will receive a postcard with your next transmission date.  Your physician recommends that you schedule a follow-up appointment in: 12 months with Dr.Croitoru

## 2014-01-04 ENCOUNTER — Encounter: Payer: Self-pay | Admitting: Cardiovascular Disease

## 2014-01-13 ENCOUNTER — Encounter (HOSPITAL_COMMUNITY): Payer: Self-pay | Admitting: Cardiovascular Disease

## 2014-02-11 DIAGNOSIS — M5136 Other intervertebral disc degeneration, lumbar region: Secondary | ICD-10-CM | POA: Diagnosis not present

## 2014-02-11 DIAGNOSIS — M4806 Spinal stenosis, lumbar region: Secondary | ICD-10-CM | POA: Diagnosis not present

## 2014-02-11 DIAGNOSIS — M4726 Other spondylosis with radiculopathy, lumbar region: Secondary | ICD-10-CM | POA: Diagnosis not present

## 2014-02-11 DIAGNOSIS — M545 Low back pain: Secondary | ICD-10-CM | POA: Diagnosis not present

## 2014-02-11 DIAGNOSIS — M5416 Radiculopathy, lumbar region: Secondary | ICD-10-CM | POA: Diagnosis not present

## 2014-02-11 DIAGNOSIS — M5430 Sciatica, unspecified side: Secondary | ICD-10-CM | POA: Diagnosis not present

## 2014-02-11 DIAGNOSIS — Z6829 Body mass index (BMI) 29.0-29.9, adult: Secondary | ICD-10-CM | POA: Diagnosis not present

## 2014-02-15 ENCOUNTER — Other Ambulatory Visit: Payer: Self-pay | Admitting: Neurosurgery

## 2014-02-15 DIAGNOSIS — M48062 Spinal stenosis, lumbar region with neurogenic claudication: Secondary | ICD-10-CM

## 2014-02-16 ENCOUNTER — Encounter: Payer: Self-pay | Admitting: Neurology

## 2014-02-16 ENCOUNTER — Ambulatory Visit: Payer: Medicare Other | Admitting: Nurse Practitioner

## 2014-02-16 ENCOUNTER — Ambulatory Visit (INDEPENDENT_AMBULATORY_CARE_PROVIDER_SITE_OTHER): Payer: Medicare Other | Admitting: Neurology

## 2014-02-16 VITALS — BP 142/82 | HR 72 | Ht 69.0 in | Wt 200.0 lb

## 2014-02-16 DIAGNOSIS — R269 Unspecified abnormalities of gait and mobility: Secondary | ICD-10-CM

## 2014-02-16 DIAGNOSIS — M545 Low back pain, unspecified: Secondary | ICD-10-CM

## 2014-02-16 MED ORDER — GABAPENTIN 800 MG PO TABS: 800.0000 mg | ORAL_TABLET | Freq: Three times a day (TID) | ORAL | Status: DC

## 2014-02-16 MED ORDER — TIZANIDINE HCL 2 MG PO TABS: 2.0000 mg | ORAL_TABLET | Freq: Three times a day (TID) | ORAL | Status: DC
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
## 2014-02-18 ENCOUNTER — Inpatient Hospital Stay: Admission: RE | Admit: 2014-02-18 | Payer: Medicare Other | Source: Ambulatory Visit

## 2014-02-18 ENCOUNTER — Other Ambulatory Visit: Payer: Self-pay

## 2014-02-18 ENCOUNTER — Other Ambulatory Visit: Payer: Medicare Other

## 2014-02-18 MED ORDER — GABAPENTIN 800 MG PO TABS: 800.0000 mg | ORAL_TABLET | Freq: Three times a day (TID) | ORAL | Status: DC

## 2014-02-18 MED ORDER — TIZANIDINE HCL 2 MG PO TABS: 2.0000 mg | ORAL_TABLET | Freq: Three times a day (TID) | ORAL | Status: DC

## 2014-02-18 NOTE — Telephone Encounter (Signed)
Patient is requesting written Rx's to take to the [place].  He will pick them up when they are ready.  Thank you.

## 2014-02-18 NOTE — Telephone Encounter (Signed)
Patient requesting Tizanidine 2 mg. Take one tablet  by mouth three times daily and gabapentin 800 mg take one tablet by mouth three times daily. Patient wants RX for three months and mailed to him.

## 2014-02-21 ENCOUNTER — Ambulatory Visit
Admission: RE | Admit: 2014-02-21 | Discharge: 2014-02-21 | Disposition: A | Discharge status: Home / Self Care | Payer: PRIVATE HEALTH INSURANCE | Source: Ambulatory Visit | Attending: Neurosurgery | Admitting: Neurosurgery

## 2014-02-21 ENCOUNTER — Ambulatory Visit
Admission: RE | Admit: 2014-02-21 | Discharge: 2014-02-21 | Disposition: A | Discharge status: Home / Self Care | Payer: Medicare Other | Source: Ambulatory Visit | Attending: Neurosurgery | Admitting: Neurosurgery

## 2014-02-21 ENCOUNTER — Telehealth: Payer: Self-pay

## 2014-02-21 DIAGNOSIS — M47816 Spondylosis without myelopathy or radiculopathy, lumbar region: Secondary | ICD-10-CM | POA: Diagnosis not present

## 2014-02-21 DIAGNOSIS — M4806 Spinal stenosis, lumbar region: Secondary | ICD-10-CM | POA: Diagnosis not present

## 2014-02-21 DIAGNOSIS — M48062 Spinal stenosis, lumbar region with neurogenic claudication: Secondary | ICD-10-CM

## 2014-02-21 DIAGNOSIS — M545 Low back pain, unspecified: Secondary | ICD-10-CM

## 2014-02-21 MED ORDER — DIAZEPAM 5 MG PO TABS
5.0000 mg | ORAL_TABLET | Freq: Once | ORAL | Status: AC
  Administered 2014-02-21: 5 mg via ORAL

## 2014-02-21 MED ORDER — IOHEXOL 180 MG/ML  SOLN
15.0000 mL | Freq: Once | INTRAMUSCULAR | Status: AC | PRN
  Administered 2014-02-21: 15 mL via INTRATHECAL

## 2014-02-21 NOTE — Telephone Encounter (Signed)
Called patient and informed Rx ready for pick up at front desk. No answer. Left vmail.

## 2014-02-21 NOTE — Discharge Instructions (Signed)
Myelogram Discharge Instructions  1. Go home and rest quietly for the next 24 hours.  It is important to lie flat for the next 24 hours.  Get up only to go to the restroom.  You may lie in the bed or on a couch on your back, your stomach, your left side or your right side.  You may have one pillow under your head.  You may have pillows between your knees while you are on your side or under your knees while you are on your back.  2. DO NOT drive today.  Recline the seat as far back as it will go, while still wearing your seat belt, on the way home.  3. You may get up to go to the bathroom as needed.  You may sit up for 10 minutes to eat.  You may resume your normal diet and medications unless otherwise indicated.  Drink lots of extra fluids today and tomorrow.  4. The incidence of headache, nausea, or vomiting is about 5% (one in 20 patients).  If you develop a headache, lie flat and drink plenty of fluids until the headache goes away.  Caffeinated beverages may be helpful.  If you develop severe nausea and vomiting or a headache that does not go away with flat bed rest, call [phone].  5. You may resume normal activities after your 24 hours of bed rest is over; however, do not exert yourself strongly or do any heavy lifting tomorrow. If when you get up you have a headache when standing, go back to bed and force fluids for another 24 hours.  6. Call your physician for a follow-up appointment.  The results of your myelogram will be sent directly to your physician by the following day.  7. If you have any questions or if complications develop after you arrive home, please call [phone].  Discharge instructions have been explained to the patient.  The patient, or the person responsible for the patient, fully understands these instructions.      May resume Trazodone and Effexor on Jan. 19, 2016, after 11:00 am.

## 2014-02-21 NOTE — Progress Notes (Signed)
Patient states he has been off Trazodone and Effexor for the past two days.  Brita Romp, RN

## 2014-02-23 DIAGNOSIS — I10 Essential (primary) hypertension: Secondary | ICD-10-CM | POA: Diagnosis not present

## 2014-02-23 DIAGNOSIS — M4806 Spinal stenosis, lumbar region: Secondary | ICD-10-CM | POA: Diagnosis not present

## 2014-02-23 DIAGNOSIS — M5416 Radiculopathy, lumbar region: Secondary | ICD-10-CM | POA: Diagnosis not present

## 2014-02-23 DIAGNOSIS — M5136 Other intervertebral disc degeneration, lumbar region: Secondary | ICD-10-CM | POA: Diagnosis not present

## 2014-02-23 DIAGNOSIS — M545 Low back pain: Secondary | ICD-10-CM | POA: Diagnosis not present

## 2014-02-23 DIAGNOSIS — M4726 Other spondylosis with radiculopathy, lumbar region: Secondary | ICD-10-CM | POA: Diagnosis not present

## 2014-02-23 DIAGNOSIS — Z6829 Body mass index (BMI) 29.0-29.9, adult: Secondary | ICD-10-CM | POA: Diagnosis not present

## 2014-03-03 DIAGNOSIS — M5136 Other intervertebral disc degeneration, lumbar region: Secondary | ICD-10-CM | POA: Diagnosis not present

## 2014-03-03 DIAGNOSIS — M47816 Spondylosis without myelopathy or radiculopathy, lumbar region: Secondary | ICD-10-CM | POA: Diagnosis not present

## 2014-03-10 ENCOUNTER — Encounter: Payer: Medicare Other | Admitting: *Deleted

## 2014-03-10 ENCOUNTER — Telehealth: Payer: Self-pay | Admitting: Cardiology

## 2014-03-10 NOTE — Telephone Encounter (Signed)
LMOVM reminding pt to send remote transmission.   

## 2014-03-11 ENCOUNTER — Encounter: Payer: Self-pay | Admitting: Cardiology

## 2014-03-15 ENCOUNTER — Telehealth: Payer: Self-pay | Admitting: Cardiovascular Disease

## 2014-03-15 NOTE — Telephone Encounter (Signed)
New Msg        Pt having trouble getting transmission to go through.    Please return pt call.

## 2014-03-15 NOTE — Telephone Encounter (Signed)
Attempted to help pt trouble shoot monitor. With no success I instructed pt call tech services. Pt verbalized understanding.

## 2014-05-04 ENCOUNTER — Telehealth (HOSPITAL_COMMUNITY): Payer: Self-pay | Admitting: *Deleted

## 2014-05-06 ENCOUNTER — Other Ambulatory Visit (HOSPITAL_COMMUNITY): Payer: Self-pay | Admitting: Cardiovascular Disease

## 2014-05-06 DIAGNOSIS — R0989 Other specified symptoms and signs involving the circulatory and respiratory systems: Secondary | ICD-10-CM

## 2014-05-10 DIAGNOSIS — R972 Elevated prostate specific antigen [PSA]: Secondary | ICD-10-CM | POA: Diagnosis not present

## 2014-05-13 DIAGNOSIS — I2510 Atherosclerotic heart disease of native coronary artery without angina pectoris: Secondary | ICD-10-CM | POA: Diagnosis not present

## 2014-05-13 DIAGNOSIS — Z1389 Encounter for screening for other disorder: Secondary | ICD-10-CM | POA: Diagnosis not present

## 2014-05-13 DIAGNOSIS — Z6828 Body mass index (BMI) 28.0-28.9, adult: Secondary | ICD-10-CM | POA: Diagnosis not present

## 2014-05-13 DIAGNOSIS — I10 Essential (primary) hypertension: Secondary | ICD-10-CM | POA: Diagnosis not present

## 2014-05-13 DIAGNOSIS — K5010 Crohn's disease of large intestine without complications: Secondary | ICD-10-CM | POA: Diagnosis not present

## 2014-05-13 DIAGNOSIS — M4306 Spondylolysis, lumbar region: Secondary | ICD-10-CM | POA: Diagnosis not present

## 2014-05-13 DIAGNOSIS — Z8679 Personal history of other diseases of the circulatory system: Secondary | ICD-10-CM | POA: Diagnosis not present

## 2014-05-13 DIAGNOSIS — G4700 Insomnia, unspecified: Secondary | ICD-10-CM | POA: Diagnosis not present

## 2014-05-13 DIAGNOSIS — F4310 Post-traumatic stress disorder, unspecified: Secondary | ICD-10-CM | POA: Diagnosis not present

## 2014-05-13 DIAGNOSIS — E785 Hyperlipidemia, unspecified: Secondary | ICD-10-CM | POA: Diagnosis not present

## 2014-05-17 DIAGNOSIS — N3281 Overactive bladder: Secondary | ICD-10-CM | POA: Diagnosis not present

## 2014-05-17 DIAGNOSIS — N3941 Urge incontinence: Secondary | ICD-10-CM | POA: Diagnosis not present

## 2014-05-17 DIAGNOSIS — N401 Enlarged prostate with lower urinary tract symptoms: Secondary | ICD-10-CM | POA: Diagnosis not present

## 2014-05-18 ENCOUNTER — Encounter: Payer: Self-pay | Admitting: *Deleted

## 2014-05-24 DIAGNOSIS — M5136 Other intervertebral disc degeneration, lumbar region: Secondary | ICD-10-CM | POA: Diagnosis not present

## 2014-05-24 DIAGNOSIS — I10 Essential (primary) hypertension: Secondary | ICD-10-CM | POA: Diagnosis not present

## 2014-05-24 DIAGNOSIS — M5416 Radiculopathy, lumbar region: Secondary | ICD-10-CM | POA: Diagnosis not present

## 2014-05-24 DIAGNOSIS — M4726 Other spondylosis with radiculopathy, lumbar region: Secondary | ICD-10-CM | POA: Diagnosis not present

## 2014-05-24 DIAGNOSIS — M545 Low back pain: Secondary | ICD-10-CM | POA: Diagnosis not present

## 2014-05-24 DIAGNOSIS — M4806 Spinal stenosis, lumbar region: Secondary | ICD-10-CM | POA: Diagnosis not present

## 2014-05-24 DIAGNOSIS — Z6828 Body mass index (BMI) 28.0-28.9, adult: Secondary | ICD-10-CM | POA: Diagnosis not present

## 2014-05-25 ENCOUNTER — Telehealth: Payer: Self-pay | Admitting: Cardiovascular Disease

## 2014-05-25 ENCOUNTER — Ambulatory Visit (HOSPITAL_COMMUNITY)
Admission: RE | Admit: 2014-05-25 | Discharge: 2014-05-25 | Disposition: A | Discharge status: Home / Self Care | Payer: Medicare Other | Source: Ambulatory Visit | Attending: Cardiovascular Disease | Admitting: Cardiovascular Disease

## 2014-05-25 DIAGNOSIS — I6523 Occlusion and stenosis of bilateral carotid arteries: Secondary | ICD-10-CM | POA: Insufficient documentation

## 2014-05-25 DIAGNOSIS — R0989 Other specified symptoms and signs involving the circulatory and respiratory systems: Secondary | ICD-10-CM

## 2014-05-25 NOTE — Progress Notes (Signed)
Carotid Duplex Completed. Mild plaque bilateral was visualized without a significant stenosis.  Oda Cogan, BS, RDMS, RVT

## 2014-06-07 ENCOUNTER — Telehealth: Payer: Self-pay | Admitting: *Deleted

## 2014-06-07 NOTE — Telephone Encounter (Signed)
Patient states that he was unsuccessful at sending in his remote transmissions last month. It was suggested to him during that time for him to call the 800# for further assistance, but he stated that that wasn't something that he cared to do at this time. Appt made for 5/5 @ 0930 w/the device clinic for ppm interrogation. Patient voiced understanding.

## 2014-06-09 ENCOUNTER — Encounter: Payer: Self-pay | Admitting: Cardiovascular Disease

## 2014-06-09 ENCOUNTER — Ambulatory Visit (INDEPENDENT_AMBULATORY_CARE_PROVIDER_SITE_OTHER): Payer: Medicare Other | Admitting: *Deleted

## 2014-06-09 DIAGNOSIS — R001 Bradycardia, unspecified: Secondary | ICD-10-CM | POA: Diagnosis not present

## 2014-06-09 DIAGNOSIS — I441 Atrioventricular block, second degree: Secondary | ICD-10-CM | POA: Diagnosis not present

## 2014-06-09 DIAGNOSIS — R140 Abdominal distension (gaseous): Secondary | ICD-10-CM | POA: Diagnosis not present

## 2014-06-09 DIAGNOSIS — R194 Change in bowel habit: Secondary | ICD-10-CM | POA: Diagnosis not present

## 2014-06-09 LAB — CUP PACEART INCLINIC DEVICE CHECK
Battery Impedance: 112 Ohm
Battery Remaining Longevity: 156 mo
Battery Voltage: 2.81 V
Brady Statistic AP VP Percent: 0 %
Brady Statistic AP VS Percent: 74 %
Brady Statistic AS VP Percent: 0 %
Brady Statistic AS VS Percent: 25 %
Date Time Interrogation Session: 20160505100123
Lead Channel Pacing Threshold Amplitude: 0.5 V
Lead Channel Sensing Intrinsic Amplitude: 4 mV
Lead Channel Setting Pacing Amplitude: 2.5 V
Lead Channel Setting Pacing Pulse Width: 0.4 ms
MDC IDC MSMT LEADCHNL RA IMPEDANCE VALUE: 405 Ohm
MDC IDC MSMT LEADCHNL RA PACING THRESHOLD PULSEWIDTH: 0.4 ms
MDC IDC MSMT LEADCHNL RV IMPEDANCE VALUE: 652 Ohm
MDC IDC MSMT LEADCHNL RV PACING THRESHOLD AMPLITUDE: 0.5 V
MDC IDC MSMT LEADCHNL RV PACING THRESHOLD PULSEWIDTH: 0.4 ms
MDC IDC MSMT LEADCHNL RV SENSING INTR AMPL: 22.4 mV
MDC IDC SET LEADCHNL RA PACING AMPLITUDE: 2 V
MDC IDC SET LEADCHNL RV SENSING SENSITIVITY: 5.6 mV

## 2014-06-09 NOTE — Progress Notes (Signed)
Pacemaker check in clinic. Normal device function. Thresholds, sensing, impedances consistent with previous measurements. Device programmed to maximize longevity. 136 mode switches (2.5%)---28 AHR episodes---max dur. 18 hrs 49 mins, Max A 178, Max Avg V 115---1:1 SVT per EGMs. 19 high ventricular rates noted---max dur. 1 min 40 sec, Max Avg V 180---1:1 SVT. Device programmed at appropriate safety margins. Histogram distribution appropriate for patient activity level. Device programmed to optimize intrinsic conduction. Estimated longevity 13 years. Patient will follow up via [facility] on 8/4 and with [facility] in 12-2014.

## 2014-06-16 DIAGNOSIS — M5136 Other intervertebral disc degeneration, lumbar region: Secondary | ICD-10-CM | POA: Diagnosis not present

## 2014-06-16 DIAGNOSIS — M4306 Spondylolysis, lumbar region: Secondary | ICD-10-CM | POA: Diagnosis not present

## 2014-06-16 DIAGNOSIS — M47816 Spondylosis without myelopathy or radiculopathy, lumbar region: Secondary | ICD-10-CM | POA: Diagnosis not present

## 2014-06-16 DIAGNOSIS — M4806 Spinal stenosis, lumbar region: Secondary | ICD-10-CM | POA: Diagnosis not present

## 2014-07-29 DIAGNOSIS — Z86010 Personal history of colonic polyps: Secondary | ICD-10-CM | POA: Diagnosis not present

## 2014-07-29 DIAGNOSIS — R140 Abdominal distension (gaseous): Secondary | ICD-10-CM | POA: Diagnosis not present

## 2014-07-29 DIAGNOSIS — R194 Change in bowel habit: Secondary | ICD-10-CM | POA: Diagnosis not present

## 2014-08-18 ENCOUNTER — Encounter: Payer: Self-pay | Admitting: Cardiovascular Disease

## 2014-08-18 ENCOUNTER — Telehealth: Payer: Self-pay | Admitting: Cardiovascular Disease

## 2014-08-18 NOTE — Telephone Encounter (Signed)
Spoke with patient who reports he was standing outside talking with some friends and passed out. He was caught and didn't hit the ground. It took him about an hour to "recover". Today he is feeling lethargic. He reports he had no warning signs - he was not lightheaded, dizzy, sweaty. He does reports a few minutes before his neck was hurting and he felt like he was having a hard time holding his head up. He has never passed out before.   Advised patient to send remote device transmission in. Advised patient I will forward this message to device team and DOD (Dr. Martinique) to review and advice further

## 2014-08-18 NOTE — Telephone Encounter (Signed)
Agree that we need to check device first. He does also have a history of orthostasis and Dr. Sallyanne Kuster increased his atenolol last visit. If device check is Ok it would be good to check orthostatic vitals.  Anagabriela Jokerst Martinique MD, [facility]

## 2014-08-18 NOTE — Telephone Encounter (Signed)
Patient states he was standing outside yesterday (not directly in the sun) talking with some friends and passed out.  Please call.

## 2014-08-18 NOTE — Telephone Encounter (Signed)
On pacer transmission no episodes recorded from 08-17-14. Pt in AF 15% of time. Last episode was 08-16-14 lasting 9 hours 23 minutes. 8 ventricular high rate episodes---last episode was 08-09-14. Routing to [employer].

## 2014-08-19 ENCOUNTER — Telehealth: Payer: Self-pay | Admitting: Cardiovascular Disease

## 2014-08-19 NOTE — Telephone Encounter (Signed)
Have spoken w/ patient - he is aware this is pending further review by physician. Informed me no repeat of episode he had 2 days ago. No concerning or new symptoms since that time.

## 2014-08-19 NOTE — Telephone Encounter (Signed)
Previous phone note - sent to Dr. Percival Spanish

## 2014-08-19 NOTE — Telephone Encounter (Signed)
Pt advised to go to the ER for further symptoms. Informed him would request scheduling to add for next week. Pt voiced understanding.

## 2014-08-19 NOTE — Telephone Encounter (Signed)
No events noted on pacemaker check.  Needs to be seen next week if he is not having further symptoms.  If he is having further symptoms (dizziness, lightheaded) he would need to be seen in the ED.

## 2014-08-19 NOTE — Telephone Encounter (Signed)
Patient states he spoke with a nurse yesterday about having passed out on Wednesday.  He was told to send in a transmission from his device, he did that and has not heard from anyone regarding the results.  Please call.Marland Kitchen

## 2014-08-19 NOTE — Telephone Encounter (Signed)
Pt said to please call him on his cell phone [phone].

## 2014-08-25 DIAGNOSIS — M5416 Radiculopathy, lumbar region: Secondary | ICD-10-CM | POA: Diagnosis not present

## 2014-08-25 DIAGNOSIS — M4726 Other spondylosis with radiculopathy, lumbar region: Secondary | ICD-10-CM | POA: Diagnosis not present

## 2014-08-25 DIAGNOSIS — M5136 Other intervertebral disc degeneration, lumbar region: Secondary | ICD-10-CM | POA: Diagnosis not present

## 2014-09-01 ENCOUNTER — Encounter: Payer: Self-pay | Admitting: Physician Assistant

## 2014-09-01 DIAGNOSIS — I10 Essential (primary) hypertension: Secondary | ICD-10-CM | POA: Diagnosis not present

## 2014-09-01 DIAGNOSIS — Z6827 Body mass index (BMI) 27.0-27.9, adult: Secondary | ICD-10-CM | POA: Diagnosis not present

## 2014-09-01 DIAGNOSIS — J209 Acute bronchitis, unspecified: Secondary | ICD-10-CM | POA: Diagnosis not present

## 2014-09-01 NOTE — Progress Notes (Addendum)
Cardiology Office Note Date:  09/02/2014  Patient ID:  Olney, Monier Apr 09, 1940, MRN [id number] PCP:  Geoffery Lyons, MD  Cardiologist: Croitori  Chief Complaint: passed out  History of Present Illness: Kevin Wiley is a 74 y.o. male with history of CAD (anterior MI/BMS to LAD in 1999, stable cath in 2008, nuc in 2013 showing scar), HTN, HLD, PAD, paroxysmal atrial fibrillation, paroxysmal atrial tachycardia, renal artery stenting (in 1999, with minimal restenosis by angio 2008, normal duplex in 2013), orthostatic dizziness, symptomatic bradycardia (s/p Medtronic Enrhythm in 2007 with generator change with a Medtronic Adapta device in May 2015), NSVT, Crohn's disease, BPH, GERD, ataxia (worse episode in 2015 which coincided with pacer reaching ERI), PTSD. Last 2D echo 04/2013: EF 76-16%, grade 1 diastolic dysfunction. His chart carries a diagnosis of atrial fibrillation under PMH and remote notes from 2007 indicates he had a history of PAF/SSS with bradycardia and syncope prompting his original pacemaker insertion. I do not see mention of prior anticoagulation.  He recently called the office reporting an episode of syncope on 08/17/14. He was meeting a couple war vet friends for an event and had drank a beer. He was standing outside under a covered roof when he apparently passed out. He had no warning of this - no CP, SOB, palpitations, sweating, dizziness or visual symptoms. He does recall some neck discomfort before the event. His 2 friends caught him from hitting the ground and helped him inside. He doesn't think he was out for more than a minute or so - not really sure. He came to and felt back to normal gradually. He denied bowel/bladder incontinence. He has felt well since without any complaints or recurrences. No CP, SOB, palpitations, or recurrent syncope. He has not had any neurologic symptoms such as headache, tremor, gait change, aphasia, facial asymmetry, or vision change. He denies  any randomly occurring dizziness but does have long history of orthostasis upon standing. He also reports a history of labile HTN with mostly controlled BPs but occasional spikes into the 200 range when he gets anxious. Carotid duplex 05/2014 only mild plaque. He sent in a remote transmission that showed no episodes recorded from the date of the event. It did show he has been in AF 15% of time. Last episode was 08-16-14 lasting 9 hours 23 minutes. 8 ventricular high rate episodes---last episode was 08-09-14. He says he was unaware of this rhythm. He said he saw his PCP after the event and states all his labwork checked out OK. He feels fine today.  ADDENDUM 09/15/14: SEE BELOW FOR COMMENTS REGARDING ARRHYTHMIA - per Dr. Sallyanne Kuster, pacer interrogation showed paroxysmal atrial tach, NOT paroxysmal atrial fib.   Past Medical History  Diagnosis Date  . Pacemaker   . PAF (paroxysmal atrial fibrillation)   . Anxiety   . Depression   . Hypertension   . Hypercholesteremia   . BPH (benign prostatic hyperplasia)   . Crohn disease   . PTSD (post-traumatic stress disorder)   . CAD (coronary artery disease)     a. BMS to LAD in 1999. b. stable cath in 2008, nuc in 2013 showing scar..  . PAD (peripheral artery disease)     a. s/p renal artery stenting (in 1999, with minimal restenosis by angio 2008, normal duplex in 2013)  . Paroxysmal atrial tachycardia   . Orthostasis   . Symptomatic bradycardia     a. s/p Medtronic Enrhythm in 2007 with generator change with a Medtronic Adapta device  in May 2015. Of note has h/o ataxia/disorientation in 2015 in 2015 which coincided with pacer reaching ERI.    Past Surgical History  Procedure Laterality Date  . Coronary stent placement    . Renal stents      x 2  . Insert / replace / remove pacemaker    . Permanent pacemaker generator change N/A 06/10/2013    Procedure: PERMANENT PACEMAKER GENERATOR CHANGE;  Surgeon: Sanda Klein, MD;  Location: [facility];  Service:  Cardiovascular;  Laterality: N/A;    Current Outpatient Prescriptions  Medication Sig Dispense Refill  . amLODipine (NORVASC) 10 MG tablet Take 5 mg by mouth daily.     Marland Kitchen aspirin 81 MG tablet Take 81 mg by mouth daily.    Marland Kitchen atenolol (TENORMIN) 50 MG tablet Take 1 tablet (50 mg total) by mouth daily. 30 tablet 11  . atorvastatin (LIPITOR) 80 MG tablet Take 80 mg by mouth daily.    . benazepril (LOTENSIN) 20 MG tablet Take 1 tablet (20 mg total) by mouth daily. 90 tablet 3  . diazepam (VALIUM) 10 MG tablet Take 10 mg by mouth 2 (two) times daily as needed for anxiety.     . finasteride (PROSCAR) 5 MG tablet Take 5 mg by mouth daily.    . fish oil-omega-3 fatty acids 1000 MG capsule Take 1 g by mouth daily.     Marland Kitchen gabapentin (NEURONTIN) 800 MG tablet Take 1 tablet (800 mg total) by mouth 3 (three) times daily. 270 tablet 3  . ketotifen (THERA TEARS ALLERGY) 0.025 % ophthalmic solution Place 1 drop into both eyes 3 (three) times daily.    Marland Kitchen omeprazole (PRILOSEC) 20 MG capsule Take 20 mg by mouth daily.    Marland Kitchen oxybutynin (DITROPAN-XL) 5 MG 24 hr tablet Take 5 mg by mouth 2 (two) times daily.    Marland Kitchen sulfaSALAzine (AZULFIDINE) 500 MG tablet Take 500 mg by mouth 2 (two) times daily.     . tamsulosin (FLOMAX) 0.4 MG CAPS capsule Take 0.4 mg by mouth.    Marland Kitchen tiZANidine (ZANAFLEX) 2 MG tablet Take 1 tablet (2 mg total) by mouth 3 (three) times daily. 270 tablet 3  . traZODone (DESYREL) 100 MG tablet Take 200 mg by mouth at bedtime.     Marland Kitchen venlafaxine XR (EFFEXOR-XR) 150 MG 24 hr capsule Take 150 mg by mouth daily.    Marland Kitchen zolpidem (AMBIEN) 10 MG tablet Take 10 mg by mouth at bedtime.      No current facility-administered medications for this visit.    Allergies:   Review of patient's allergies indicates no known allergies.   Social History:  The patient  reports that he quit smoking about 42 years ago. He has never used smokeless tobacco. He reports that he drinks alcohol. He reports that he does not use  illicit drugs.   Family History:  The patient's family history includes Cancer in an other family member; Heart attack in an other family member.  ROS:  Please see the history of present illness. All other systems are reviewed and otherwise negative.   PHYSICAL EXAM:  VS:  BP 112/80 mmHg  Pulse 86  Wt 187 lb (84.823 kg)  SpO2 97% BMI: Body mass index is 27.6 kg/(m^2). Pulse Ox 97% on RA Well nourished, well developed WM, in no acute distress HEENT: normocephalic, atraumatic Neck: no JVD, carotid bruits or masses Cardiac:  normal S1, S2; RRR; no murmurs, rubs, or gallops Lungs:  clear to auscultation bilaterally, no  wheezing, rhonchi or rales Abd: soft, nontender, no hepatomegaly, + BS MS: no deformity or atrophy Ext: no edema Skin: warm and dry, no rash Neuro:  moves all extremities spontaneously, no focal abnormalities noted, strength in tact bilaterally, stable gait, follows commands Psych: euthymic mood, full affect   EKG:  Done today shows atrial paced, incomplete RBBB, LVH with repol abnormalities iwht TWI I, avL, V4-V6 and biphasic T waves V2-V3 similar to prior tracing. QTc 479ms.  Recent Labs: No results found for requested labs within last 365 days.  No results found for requested labs within last 365 days.   CrCl cannot be calculated (Unknown ideal weight.).   Wt Readings from Last 3 Encounters:  09/02/14 187 lb (84.823 kg)  02/16/14 200 lb (90.719 kg)  12/07/13 193 lb (87.544 kg)     Other studies reviewed: Additional studies/records reviewed today include: summarized above  ASSESSMENT AND PLAN:  1. Syncope - etiology unclear, possibly related to history of orthostasis in the setting of being outside in the heat and having drank a beer. Pacer interrogation rules out arrhythmogenic etiology. Carotid duplex recently showed only mild plaque. Per patient, PCP checked labs since the event and everything looked OK. He does not have any focal neurologic signs to  suggest an intracranial process going on. He has not had any recent anginal symptoms. Exam is unrevealing. No signs of valvular disease on exam. We discussed possibility of decreasing blood pressure medication to see if this prevents another episode. However, the patient does endorse large spikes in BP when he is feeling anxious, and his preference is to leave his meds as they are today. This is reasonable. He will seek medical attention if this happens again. I advised him of the DMV recommendation not to drive for 6 months post-syncope. 2. Essential HTN with history of orthostasis - see above. He knows how to exercise orthostatic precautions. 3. Paroxysmal atrial fibrillation/paroxysmal atrial tach - I will discuss the possible PAF seen on his pacemaker with Dr. Sallyanne Kuster for further recommendations. CHADSVASC = 3 thus warranting anticoagulation if the atrial arrhythmia is in fact fib. Dr. Loletha Grayer refers to prior history of PAT in his notes so I will clarify the diagnosis. The patient does relay a history of falls in the past and this will need to be taken into consideration. He is unaware of any recent palpitations. ADDENDUM 09/15/14: reviewed with Dr. Sallyanne Kuster who reports "All of the arrhythmia events appear to be regular atrial tachycardia, not atrial fibrillation. Rate control during the sustained events is good (75-85 bpm). Flurry of events in May-June, none coincided with his syncope." 4. CAD s/p prior PCI - no recent anginal symptoms. Continue ASA, BB and statin.   Disposition: F/u with Dr. Sallyanne Kuster in 6-8 weeks.  Current medicines are reviewed at length with the patient today.  The patient did not have any concerns regarding medicines.  Raechel Ache PA-C 09/02/2014 10:08 AM     [facility] [address], [place], [place] [address] Phone: [phone]

## 2014-09-02 ENCOUNTER — Ambulatory Visit: Payer: PRIVATE HEALTH INSURANCE | Admitting: Physician Assistant

## 2014-09-02 ENCOUNTER — Ambulatory Visit (INDEPENDENT_AMBULATORY_CARE_PROVIDER_SITE_OTHER): Payer: Medicare Other | Admitting: Physician Assistant

## 2014-09-02 ENCOUNTER — Encounter: Payer: Self-pay | Admitting: Physician Assistant

## 2014-09-02 VITALS — BP 112/80 | HR 86 | Wt 187.0 lb

## 2014-09-02 DIAGNOSIS — I480 Paroxysmal atrial fibrillation: Secondary | ICD-10-CM

## 2014-09-02 DIAGNOSIS — I10 Essential (primary) hypertension: Secondary | ICD-10-CM | POA: Diagnosis not present

## 2014-09-02 DIAGNOSIS — I471 Supraventricular tachycardia: Secondary | ICD-10-CM

## 2014-09-02 DIAGNOSIS — I951 Orthostatic hypotension: Secondary | ICD-10-CM

## 2014-09-02 DIAGNOSIS — R55 Syncope and collapse: Secondary | ICD-10-CM

## 2014-09-02 DIAGNOSIS — I2510 Atherosclerotic heart disease of native coronary artery without angina pectoris: Secondary | ICD-10-CM

## 2014-09-02 DIAGNOSIS — Z9861 Coronary angioplasty status: Secondary | ICD-10-CM

## 2014-09-02 NOTE — Patient Instructions (Signed)
Your physician recommends that you schedule a follow-up appointment in: 6-8 weeks with Dr. Sallyanne Kuster.  Please call us if you have another event.

## 2014-09-08 ENCOUNTER — Encounter: Payer: Medicare Other | Admitting: *Deleted

## 2014-09-15 ENCOUNTER — Telehealth: Payer: Self-pay | Admitting: Physician Assistant

## 2014-09-15 ENCOUNTER — Encounter: Payer: Self-pay | Admitting: Cardiology

## 2014-09-15 NOTE — Telephone Encounter (Signed)
Please let patient know Dr. Sallyanne Kuster was able to review his pacemaker interrogation after it was finally downloaded. It showed atrial tach which Dr. Sallyanne Kuster knew the patient had before, but NO new atrial fibrillation - thus he does not require any extra blood thinners at this time. Keep follow-up as planned.   Shenoa Hattabaugh PA-C

## 2014-09-15 NOTE — Telephone Encounter (Signed)
Spoke with pt and informed him of information provided by Melina Copa, PA-C. Pt verbalized understanding and was in agreement with plan to f/u with Dr. Sallyanne Kuster as planned.

## 2014-09-15 NOTE — Telephone Encounter (Signed)
Left message to call back. Patient has appointment with Dr. Sallyanne Kuster on 9/13 at 9:45 am.

## 2014-09-23 ENCOUNTER — Telehealth: Payer: Self-pay | Admitting: Cardiovascular Disease

## 2014-09-23 NOTE — Telephone Encounter (Signed)
LMOVM w/ my direct # to discuss in person check every 6 months instead of remotes.

## 2014-09-23 NOTE — Telephone Encounter (Signed)
New message     Pt calling in regards to letter about transmissions not being received Pt states he suffers from PTSD and doesn't need the added stress from the device not working properly

## 2014-09-26 ENCOUNTER — Encounter: Payer: Self-pay | Admitting: *Deleted

## 2014-09-26 NOTE — Progress Notes (Signed)
Pacemaker remote check from 08/18/14 for episodes per [facility] ([place] Device function reviewed. Impedance, sensing, auto capture thresholds consistent with previous measurements. Histograms appropriate for patient and level of activity. All other diagnostic data reviewed and is appropriate and stable for patient. Real time/magnet EGM shows appropriate sensing and capture. (45) mode switches (15%)---Max dur. >96 hours, Max A 174, Max Avg V 86---no AF, recurrent PAT. (8) ventricular high rate episodes---max dur. 7 sec, Max Avg V 165. Estimated longevity 12.5 years. Plan to follow up with [facility] as scheduled.

## 2014-09-26 NOTE — Progress Notes (Signed)
Pacemaker remote check for episodes per [facility] ([place] Device function reviewed. Impedance, sensing, auto capture thresholds consistent with previous measurements. Histograms appropriate for patient and level of activity. All other diagnostic data reviewed and is appropriate and stable for patient. Real time/magnet EGM shows appropriate sensing and capture. (45) mode switches (15%)---Max dur. >96 hours, Max A 174, Max Avg V 86---no AF, recurrent PAT. (8) ventricular high rate episodes---max dur. 7 sec, Max Avg V 165. Estimated longevity 12.5 years. Plan to follow up with [facility] as scheduled.

## 2014-09-27 NOTE — Telephone Encounter (Signed)
Pt seeing Dr. Sallyanne Kuster 10/18/14. I put annotation in note to discuss [facility] only vs. [facility].

## 2014-10-11 DIAGNOSIS — E780 Pure hypercholesterolemia: Secondary | ICD-10-CM | POA: Diagnosis not present

## 2014-10-11 DIAGNOSIS — I10 Essential (primary) hypertension: Secondary | ICD-10-CM | POA: Diagnosis not present

## 2014-10-11 DIAGNOSIS — Z79899 Other long term (current) drug therapy: Secondary | ICD-10-CM | POA: Diagnosis not present

## 2014-10-11 DIAGNOSIS — Z7982 Long term (current) use of aspirin: Secondary | ICD-10-CM | POA: Diagnosis not present

## 2014-10-11 DIAGNOSIS — D692 Other nonthrombocytopenic purpura: Secondary | ICD-10-CM | POA: Diagnosis not present

## 2014-10-11 DIAGNOSIS — L570 Actinic keratosis: Secondary | ICD-10-CM | POA: Diagnosis not present

## 2014-10-11 DIAGNOSIS — Z85828 Personal history of other malignant neoplasm of skin: Secondary | ICD-10-CM | POA: Diagnosis not present

## 2014-10-11 DIAGNOSIS — D234 Other benign neoplasm of skin of scalp and neck: Secondary | ICD-10-CM | POA: Diagnosis not present
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
## 2014-10-18 ENCOUNTER — Encounter: Payer: Self-pay | Admitting: Cardiovascular Disease

## 2014-10-18 ENCOUNTER — Ambulatory Visit (INDEPENDENT_AMBULATORY_CARE_PROVIDER_SITE_OTHER): Payer: Medicare Other | Admitting: Cardiovascular Disease

## 2014-10-18 VITALS — BP 140/85 | HR 80 | Resp 16 | Ht 69.0 in | Wt 190.0 lb

## 2014-10-18 DIAGNOSIS — E785 Hyperlipidemia, unspecified: Secondary | ICD-10-CM

## 2014-10-18 DIAGNOSIS — Z950 Presence of cardiac pacemaker: Secondary | ICD-10-CM

## 2014-10-18 DIAGNOSIS — R001 Bradycardia, unspecified: Secondary | ICD-10-CM

## 2014-10-18 DIAGNOSIS — I2510 Atherosclerotic heart disease of native coronary artery without angina pectoris: Secondary | ICD-10-CM

## 2014-10-18 DIAGNOSIS — I10 Essential (primary) hypertension: Secondary | ICD-10-CM | POA: Diagnosis not present

## 2014-10-18 DIAGNOSIS — I441 Atrioventricular block, second degree: Secondary | ICD-10-CM | POA: Diagnosis not present

## 2014-10-18 DIAGNOSIS — I701 Atherosclerosis of renal artery: Secondary | ICD-10-CM

## 2014-10-18 DIAGNOSIS — Z79899 Other long term (current) drug therapy: Secondary | ICD-10-CM

## 2014-10-18 NOTE — Patient Instructions (Signed)
Remote monitoring is used to monitor your Pacemaker from home. This monitoring reduces the number of office visits required to check your device to one time per year. It allows [place] to monitor the functioning of your device to ensure it is working properly. You are scheduled for a device check from home on January 18, 2015. You may send your transmission at any time that day. If you have a wireless device, the transmission will be sent automatically. After your physician reviews your transmission, you will receive a postcard with your next transmission date.  Dr. Sallyanne Kuster recommends that you schedule a follow-up appointment in: [place] (MDT - GRAY).

## 2014-10-28 DIAGNOSIS — M4806 Spinal stenosis, lumbar region: Secondary | ICD-10-CM | POA: Diagnosis not present

## 2014-10-28 DIAGNOSIS — M5136 Other intervertebral disc degeneration, lumbar region: Secondary | ICD-10-CM | POA: Diagnosis not present

## 2014-10-28 DIAGNOSIS — M5416 Radiculopathy, lumbar region: Secondary | ICD-10-CM | POA: Diagnosis not present

## 2014-10-28 DIAGNOSIS — M545 Low back pain: Secondary | ICD-10-CM | POA: Diagnosis not present

## 2014-10-28 DIAGNOSIS — M4726 Other spondylosis with radiculopathy, lumbar region: Secondary | ICD-10-CM | POA: Diagnosis not present

## 2014-11-04 ENCOUNTER — Telehealth: Payer: Self-pay | Admitting: *Deleted

## 2014-11-04 NOTE — Telephone Encounter (Signed)
Informed patient that Kevin Wiley is not needed. Patient voiced understanding.   Plan is for patient to try to send remote in December but if he is unable then an appt will be made with [facility]. Patient voiced understanding of all information given.

## 2014-11-04 NOTE — Telephone Encounter (Addendum)
LMTCB/SSS  Patient does not need cell adaptor. Has new mycarelink monitor.

## 2014-11-06 LAB — CUP PACEART INCLINIC DEVICE CHECK
Battery Remaining Longevity: 149 mo
Battery Voltage: 2.8 V
Brady Statistic AS VP Percent: 0 %
Brady Statistic AS VS Percent: 24 %
Date Time Interrogation Session: 20160913114924
Lead Channel Impedance Value: 420 Ohm
Lead Channel Pacing Threshold Amplitude: 0.5 V
Lead Channel Pacing Threshold Amplitude: 0.625 V
Lead Channel Pacing Threshold Pulse Width: 0.4 ms
Lead Channel Sensing Intrinsic Amplitude: 16 mV
Lead Channel Sensing Intrinsic Amplitude: 2.8 mV
Lead Channel Setting Pacing Amplitude: 2 V
Lead Channel Setting Pacing Amplitude: 2.5 V
Lead Channel Setting Pacing Pulse Width: 0.4 ms
Lead Channel Setting Sensing Sensitivity: 5.6 mV
MDC IDC MSMT BATTERY IMPEDANCE: 112 Ohm
MDC IDC MSMT LEADCHNL RA PACING THRESHOLD PULSEWIDTH: 0.4 ms
MDC IDC MSMT LEADCHNL RV IMPEDANCE VALUE: 627 Ohm
MDC IDC STAT BRADY AP VP PERCENT: 0 %
MDC IDC STAT BRADY AP VS PERCENT: 76 %

## 2014-11-10 DIAGNOSIS — M4806 Spinal stenosis, lumbar region: Secondary | ICD-10-CM | POA: Diagnosis not present

## 2014-11-10 DIAGNOSIS — M5136 Other intervertebral disc degeneration, lumbar region: Secondary | ICD-10-CM | POA: Diagnosis not present

## 2014-11-10 DIAGNOSIS — M4726 Other spondylosis with radiculopathy, lumbar region: Secondary | ICD-10-CM | POA: Diagnosis not present

## 2014-11-16 ENCOUNTER — Encounter: Payer: Self-pay | Admitting: Cardiovascular Disease

## 2014-11-18 DIAGNOSIS — Z6827 Body mass index (BMI) 27.0-27.9, adult: Secondary | ICD-10-CM | POA: Diagnosis not present

## 2014-11-18 DIAGNOSIS — W102XXA Fall (on)(from) incline, initial encounter: Secondary | ICD-10-CM | POA: Diagnosis not present

## 2014-11-18 DIAGNOSIS — M545 Low back pain: Secondary | ICD-10-CM | POA: Diagnosis not present

## 2014-11-28 DIAGNOSIS — M791 Myalgia: Secondary | ICD-10-CM | POA: Diagnosis not present

## 2014-11-28 DIAGNOSIS — M25552 Pain in left hip: Secondary | ICD-10-CM | POA: Diagnosis not present

## 2014-11-30 DIAGNOSIS — I714 Abdominal aortic aneurysm, without rupture: Secondary | ICD-10-CM | POA: Diagnosis not present

## 2014-11-30 DIAGNOSIS — Z6827 Body mass index (BMI) 27.0-27.9, adult: Secondary | ICD-10-CM | POA: Diagnosis not present

## 2014-11-30 DIAGNOSIS — I10 Essential (primary) hypertension: Secondary | ICD-10-CM | POA: Diagnosis not present

## 2014-12-16 ENCOUNTER — Other Ambulatory Visit: Payer: Self-pay | Admitting: *Deleted

## 2014-12-16 DIAGNOSIS — I714 Abdominal aortic aneurysm, without rupture, unspecified: Secondary | ICD-10-CM

## 2014-12-21 ENCOUNTER — Telehealth: Payer: Self-pay | Admitting: Cardiovascular Disease

## 2014-12-21 NOTE — Telephone Encounter (Signed)
Called patient. He explains he had recent imaging done by [facility] for back complaints that showed incidentally a 4cm dilation of the aorta, no specificity as to the exact location. Pt upset because he feels this should have been noted on previous imaging.  Has renal dopplers yearly d/t history of renal stents.  Attempted to contact [facility] to get records - was not able to get in touch w/ anyone. Can reattempt later.  Reviewed prior records available in EPIC. I do see historical CT Abd form 09/2013 which noted among other findings a 2.8 cm infrarenal aortic dilation. Not sure if this is same area indicated in recent result. This was ordered by Dr. Watt Climes. Discussed this w/ patient, he does not recall reason for test or having those results discussed.  Pt currently has AAA Duplex ordered, pending for 01/24/15. Also has consult w/ Dr. Donnetta Hutching same day. States concern and urgency about this, wants to know if warranted to move up test. Requested advice from Dr. Sallyanne Kuster, sought assessment of the urgency.  Informed pt I would defer to physician for any additional advice.

## 2014-12-21 NOTE — Telephone Encounter (Signed)
Please call,pt have some concerns.

## 2014-12-21 NOTE — Telephone Encounter (Signed)
Please let him know not to be too upset, regardless of the location of the 4 cm enlargement, this is mild and not close to needing any intervention (depending on location the aorta with have to enlarge to anywhere from 5 cm to 6 cm to require surgery or other treatments ). I would advise waiting until his follow-up in December with Dr. Donnetta Hutching.  Since the abnormality was seen on a scan for his back , I suspect we are talking about the abdominal aorta. This would definitely the evaluated with his ultrasound in December and is in Dr. Luther Parody domain of expertise

## 2014-12-21 NOTE — Telephone Encounter (Signed)
Pt given reassurance based on Dr. Victorino December advice. He voiced understanding. Will f/u as scheduled.

## 2014-12-26 DIAGNOSIS — M4696 Unspecified inflammatory spondylopathy, lumbar region: Secondary | ICD-10-CM | POA: Diagnosis not present

## 2014-12-26 DIAGNOSIS — M791 Myalgia: Secondary | ICD-10-CM | POA: Diagnosis not present

## 2015-01-05 ENCOUNTER — Other Ambulatory Visit (HOSPITAL_COMMUNITY): Payer: Self-pay | Admitting: Orthopedic Surgery

## 2015-01-05 DIAGNOSIS — M25552 Pain in left hip: Secondary | ICD-10-CM

## 2015-01-10 ENCOUNTER — Encounter (HOSPITAL_COMMUNITY)
Admission: RE | Admit: 2015-01-10 | Discharge: 2015-01-10 | Disposition: A | Discharge status: Home / Self Care | Payer: Medicare Other | Source: Ambulatory Visit | Attending: Orthopedic Surgery | Admitting: Orthopedic Surgery

## 2015-01-10 DIAGNOSIS — M25552 Pain in left hip: Secondary | ICD-10-CM | POA: Insufficient documentation

## 2015-01-10 MED ORDER — TECHNETIUM TC 99M MEDRONATE IV KIT
25.0000 | PACK | Freq: Once | INTRAVENOUS | Status: AC | PRN
  Administered 2015-01-10: 23.8 via INTRAVENOUS

## 2015-01-18 ENCOUNTER — Encounter: Payer: Self-pay | Admitting: Vascular Surgery

## 2015-01-18 ENCOUNTER — Ambulatory Visit (INDEPENDENT_AMBULATORY_CARE_PROVIDER_SITE_OTHER): Payer: Medicare Other | Admitting: *Deleted

## 2015-01-18 ENCOUNTER — Telehealth: Payer: Self-pay | Admitting: Cardiology

## 2015-01-18 DIAGNOSIS — I495 Sick sinus syndrome: Secondary | ICD-10-CM

## 2015-01-18 NOTE — Telephone Encounter (Signed)
Spoke with pt and reminded pt of remote transmission that is due today. Pt verbalized understanding.   

## 2015-01-18 NOTE — Progress Notes (Signed)
Remote pacemaker transmission.   

## 2015-01-23 LAB — CUP PACEART REMOTE DEVICE CHECK
Battery Voltage: 2.8 V
Brady Statistic AP VS Percent: 75 %
Brady Statistic AS VP Percent: 0 %
Brady Statistic AS VS Percent: 24 %
Implantable Lead Implant Date: 20070906
Implantable Lead Location: 753859
Implantable Lead Model: 4092
Lead Channel Impedance Value: 388 Ohm
Lead Channel Impedance Value: 573 Ohm
Lead Channel Pacing Threshold Amplitude: 0.625 V
Lead Channel Pacing Threshold Pulse Width: 0.4 ms
Lead Channel Pacing Threshold Pulse Width: 0.4 ms
Lead Channel Sensing Intrinsic Amplitude: 16 mV
Lead Channel Sensing Intrinsic Amplitude: 2.8 mV
Lead Channel Setting Pacing Amplitude: 2 V
Lead Channel Setting Pacing Pulse Width: 0.4 ms
Lead Channel Setting Sensing Sensitivity: 5.6 mV
MDC IDC LEAD IMPLANT DT: 20070906
MDC IDC LEAD LOCATION: 753860
MDC IDC MSMT BATTERY IMPEDANCE: 136 Ohm
MDC IDC MSMT BATTERY REMAINING LONGEVITY: 140 mo
MDC IDC MSMT LEADCHNL RA PACING THRESHOLD AMPLITUDE: 0.625 V
MDC IDC SESS DTM: 20161214173616
MDC IDC SET LEADCHNL RV PACING AMPLITUDE: 2.5 V
MDC IDC STAT BRADY AP VP PERCENT: 1 %

## 2015-01-24 ENCOUNTER — Ambulatory Visit (INDEPENDENT_AMBULATORY_CARE_PROVIDER_SITE_OTHER): Payer: Medicare Other | Admitting: Vascular Surgery

## 2015-01-24 ENCOUNTER — Ambulatory Visit (HOSPITAL_COMMUNITY)
Admission: RE | Admit: 2015-01-24 | Discharge: 2015-01-24 | Disposition: A | Discharge status: Home / Self Care | Payer: Medicare Other | Source: Ambulatory Visit | Attending: Vascular Surgery | Admitting: Vascular Surgery

## 2015-01-24 ENCOUNTER — Encounter: Payer: Self-pay | Admitting: Vascular Surgery

## 2015-01-24 VITALS — BP 187/108 | HR 68 | Ht 69.0 in | Wt 187.0 lb

## 2015-01-24 DIAGNOSIS — I714 Abdominal aortic aneurysm, without rupture, unspecified: Secondary | ICD-10-CM

## 2015-01-24 DIAGNOSIS — I701 Atherosclerosis of renal artery: Secondary | ICD-10-CM | POA: Diagnosis not present

## 2015-01-24 NOTE — Progress Notes (Signed)
[facility]  Patient name: Kevin Wiley MRN: [id number] DOB: 1940-03-18 Sex: male  REASON FOR CONSULT: Discuss abdominal aortic aneurysm  HPI: Kevin Wiley is a 74 y.o. male, who is seen today for a discussion of abdominal aortic aneurysm. There was some confusion regarding this. Patient reports that he has had many duplexes for evaluation of his renal artery stenting and has never been told that he has an aneurysm. He recently had a fall and had plain films of his spine. He was told that he did have an aneurysm on plain film. His past history is significant for coronary disease with prior stenting. He does have a history of bilateral renal artery stenting in the late 1990s and has serial ultrasound for follow-up of this. No history of lower extremity claudication symptoms and no history of stroke.  Past Medical History  Diagnosis Date  . Pacemaker   . PAF (paroxysmal atrial fibrillation) ([place])   . Anxiety   . Depression   . Hypertension   . Hypercholesteremia   . BPH (benign prostatic hyperplasia)   . Crohn disease ([place])   . PTSD (post-traumatic stress disorder)   . CAD (coronary artery disease)     a. BMS to LAD in 1999. b. stable cath in 2008, nuc in 2013 showing scar..  . PAD (peripheral artery disease) ([place])     a. s/p renal artery stenting (in 1999, with minimal restenosis by angio 2008, normal duplex in 2013)  . Paroxysmal atrial tachycardia ([place])   . Orthostasis   . Symptomatic bradycardia     a. s/p Medtronic Enrhythm in 2007 with generator change with a Medtronic Adapta device in May 2015. Of note has h/o ataxia/disorientation in 2015 in 2015 which coincided with pacer reaching ERI.  Marland Kitchen AAA (abdominal aortic aneurysm) ([facility])     Family History  Problem Relation Age of Onset  . Heart attack    . Cancer    . Heart disease Mother     before age 41  . AAA (abdominal aortic aneurysm) Father     SOCIAL HISTORY: Social History   Social  History  . Marital Status: Married    Spouse Name: N/A  . Number of Children: 1  . Years of Education: bs   Occupational History  .      retired   Social History Main Topics  . Smoking status: Former Smoker -- 22 years    Quit date: 05/03/1972  . Smokeless tobacco: Never Used  . Alcohol Use: 0.0 oz/week    0 Standard drinks or equivalent per week     Comment: 1 to 2 beers once or twice a week  . Drug Use: No  . Sexual Activity: Not on file   Other Topics Concern  . Not on file   Social History Narrative   Patient lives at home with his wife.    Education - college   Right handed   Caffeine daily    No Known Allergies  Current Outpatient Prescriptions  Medication Sig Dispense Refill  . amLODipine (NORVASC) 10 MG tablet Take 5 mg by mouth daily.     Marland Kitchen aspirin 81 MG tablet Take 81 mg by mouth daily.    Marland Kitchen atenolol (TENORMIN) 50 MG tablet Take 1 tablet (50 mg total) by mouth daily. 30 tablet 11  . atorvastatin (LIPITOR) 80 MG tablet Take 80 mg by mouth daily.    . benazepril (LOTENSIN) 20 MG tablet Take 1  tablet (20 mg total) by mouth daily. 90 tablet 3  . diazepam (VALIUM) 10 MG tablet Take 10 mg by mouth 2 (two) times daily as needed for anxiety.     . finasteride (PROSCAR) 5 MG tablet Take 5 mg by mouth daily.    Marland Kitchen gabapentin (NEURONTIN) 800 MG tablet Take 1 tablet (800 mg total) by mouth 3 (three) times daily. 270 tablet 3  . ketotifen (THERA TEARS ALLERGY) 0.025 % ophthalmic solution Place 1 drop into both eyes 3 (three) times daily.    Marland Kitchen omeprazole (PRILOSEC) 20 MG capsule Take 20 mg by mouth daily.    Marland Kitchen oxybutynin (DITROPAN-XL) 5 MG 24 hr tablet Take 5 mg by mouth 2 (two) times daily.    Marland Kitchen sulfaSALAzine (AZULFIDINE) 500 MG tablet Take 500 mg by mouth 2 (two) times daily.     . tamsulosin (FLOMAX) 0.4 MG CAPS capsule Take 0.4 mg by mouth.    Marland Kitchen tiZANidine (ZANAFLEX) 2 MG tablet Take 1 tablet (2 mg total) by mouth 3 (three) times daily. 270 tablet 3  . traZODone  (DESYREL) 100 MG tablet Take 200 mg by mouth at bedtime.     Marland Kitchen venlafaxine XR (EFFEXOR-XR) 150 MG 24 hr capsule Take 150 mg by mouth daily.    Marland Kitchen zolpidem (AMBIEN) 10 MG tablet Take 10 mg by mouth at bedtime.      No current facility-administered medications for this visit.    REVIEW OF SYSTEMS:  [X]  denotes positive finding, [ ]  denotes negative finding Cardiac  Comments:  Chest pain or chest pressure:    Shortness of breath upon exertion: x   Short of breath when lying flat:    Irregular heart rhythm:        Vascular    Pain in calf, thigh, or hip brought on by ambulation:    Pain in feet at night that wakes you up from your sleep:     Blood clot in your veins:    Leg swelling:         Pulmonary    Oxygen at home:    Productive cough:     Wheezing:         Neurologic    Sudden weakness in arms or legs:     Sudden numbness in arms or legs:     Sudden onset of difficulty speaking or slurred speech:    Temporary loss of vision in one eye:     Problems with dizziness:         Gastrointestinal    Blood in stool:     Vomited blood:         Genitourinary    Burning when urinating:     Blood in urine:        Psychiatric    Major depression:  x       Hematologic    Bleeding problems:    Problems with blood clotting too easily:        Skin    Rashes or ulcers:        Constitutional    Fever or chills:      PHYSICAL EXAM: Filed Vitals:   01/24/15 0901 01/24/15 0905  BP: 186/104 187/108  Pulse: 68   Height: 5' 9"  (1.753 m)   Weight: 187 lb (84.823 kg)   SpO2: 99%     GENERAL: The patient is a well-nourished male, in no acute distress. The vital signs are documented above. CARDIAC: There is a regular rate and  rhythm.  VASCULAR: 2+ radial pulses bilaterally. 2+ femoral 2+ popliteal 2+ posterior tibial pulses with no evidence of peripheral aneurysm PULMONARY: There is good air exchange bilaterally without wheezing or rales. ABDOMEN: Soft and non-tender with  normal pitched bowel sounds. No bruits noted. No aneurysm palpable  MUSCULOSKELETAL: There are no major deformities or cyanosis. NEUROLOGIC: No focal weakness or paresthesias are detected. SKIN: There are no ulcers or rashes noted. PSYCHIATRIC: The patient has a normal affect.  DATA:  Duplex today of his aorta reveals maximal diameter of 2.9 cm.  I did review his prior scans. He had a CT scan of his abdomen for abdominal pain in August 2015. At that time his maximal diameter was 2.8 cm. He had spine films CT in January 2016 and this showed no evidence of any enlargement at that time as well  MEDICAL ISSUES: Had a long discussion with the patient regarding his aortic ectasia. Explained that his aorta is slightly enlarged and he does have some mural thrombus on his CT scan from August 2015. Explained that this puts him at no risk for rupture. Explained normal size of his aorta would be approximately 2 cm we would consider elective aneurysm repair if it reached 5.0-5.5 cm. He is having yearly renal duplex for follow-up of his bilateral renal stenting it [facility]. He can continue this in the: Have ongoing surveillance based on that. Would recommend annual or even every other year duplex to rule out enlargement. Explained that his lifelong risk for aneurysm expansion to the level requiring treatment would be unlikely. He will see [place] again on as-needed basis   Sheala Dosh [facility] [employer]: [phone]

## 2015-01-26 ENCOUNTER — Encounter: Payer: Self-pay | Admitting: *Deleted

## 2015-02-09 ENCOUNTER — Telehealth: Payer: Self-pay | Admitting: *Deleted

## 2015-02-09 DIAGNOSIS — M5416 Radiculopathy, lumbar region: Secondary | ICD-10-CM | POA: Diagnosis not present

## 2015-02-09 DIAGNOSIS — M4806 Spinal stenosis, lumbar region: Secondary | ICD-10-CM | POA: Diagnosis not present

## 2015-02-09 DIAGNOSIS — M545 Low back pain: Secondary | ICD-10-CM | POA: Diagnosis not present

## 2015-02-09 DIAGNOSIS — M4726 Other spondylosis with radiculopathy, lumbar region: Secondary | ICD-10-CM | POA: Diagnosis not present

## 2015-02-09 DIAGNOSIS — M5136 Other intervertebral disc degeneration, lumbar region: Secondary | ICD-10-CM | POA: Diagnosis not present

## 2015-02-09 DIAGNOSIS — Z6827 Body mass index (BMI) 27.0-27.9, adult: Secondary | ICD-10-CM | POA: Diagnosis not present

## 2015-02-09 DIAGNOSIS — I10 Essential (primary) hypertension: Secondary | ICD-10-CM | POA: Diagnosis not present

## 2015-02-09 NOTE — Telephone Encounter (Signed)
Rx for gabapentin and tizanidine printed on 02/18/14 - never picked up by patient - scripts destroyed.

## 2015-02-16 DIAGNOSIS — Z23 Encounter for immunization: Secondary | ICD-10-CM | POA: Diagnosis not present

## 2015-02-20 DIAGNOSIS — Z6827 Body mass index (BMI) 27.0-27.9, adult: Secondary | ICD-10-CM | POA: Diagnosis not present

## 2015-02-20 DIAGNOSIS — I10 Essential (primary) hypertension: Secondary | ICD-10-CM | POA: Diagnosis not present

## 2015-02-20 DIAGNOSIS — R000 Tachycardia, unspecified: Secondary | ICD-10-CM | POA: Diagnosis not present

## 2015-02-20 DIAGNOSIS — Z8679 Personal history of other diseases of the circulatory system: Secondary | ICD-10-CM | POA: Diagnosis not present

## 2015-02-23 DIAGNOSIS — M47816 Spondylosis without myelopathy or radiculopathy, lumbar region: Secondary | ICD-10-CM | POA: Diagnosis not present

## 2015-02-23 DIAGNOSIS — M5136 Other intervertebral disc degeneration, lumbar region: Secondary | ICD-10-CM | POA: Diagnosis not present

## 2015-02-23 DIAGNOSIS — M4806 Spinal stenosis, lumbar region: Secondary | ICD-10-CM | POA: Diagnosis not present

## 2015-04-25 ENCOUNTER — Encounter: Payer: Medicare Other | Admitting: Cardiovascular Disease

## 2015-04-27 DIAGNOSIS — M4806 Spinal stenosis, lumbar region: Secondary | ICD-10-CM | POA: Diagnosis not present

## 2015-04-27 DIAGNOSIS — M5416 Radiculopathy, lumbar region: Secondary | ICD-10-CM | POA: Diagnosis not present

## 2015-04-27 DIAGNOSIS — M5136 Other intervertebral disc degeneration, lumbar region: Secondary | ICD-10-CM | POA: Diagnosis not present

## 2015-04-27 DIAGNOSIS — M4726 Other spondylosis with radiculopathy, lumbar region: Secondary | ICD-10-CM | POA: Diagnosis not present

## 2015-05-02 ENCOUNTER — Encounter: Payer: Medicare Other | Admitting: Cardiovascular Disease

## 2015-05-03 ENCOUNTER — Ambulatory Visit (INDEPENDENT_AMBULATORY_CARE_PROVIDER_SITE_OTHER): Payer: Medicare Other | Admitting: Cardiovascular Disease

## 2015-05-03 VITALS — BP 131/89 | HR 72 | Ht 69.0 in | Wt 177.2 lb

## 2015-05-03 DIAGNOSIS — I471 Supraventricular tachycardia: Secondary | ICD-10-CM

## 2015-05-03 DIAGNOSIS — I4719 Other supraventricular tachycardia: Secondary | ICD-10-CM

## 2015-05-03 DIAGNOSIS — I2510 Atherosclerotic heart disease of native coronary artery without angina pectoris: Secondary | ICD-10-CM | POA: Diagnosis not present

## 2015-05-03 DIAGNOSIS — I441 Atrioventricular block, second degree: Secondary | ICD-10-CM | POA: Diagnosis not present

## 2015-05-03 DIAGNOSIS — I10 Essential (primary) hypertension: Secondary | ICD-10-CM

## 2015-05-03 DIAGNOSIS — R001 Bradycardia, unspecified: Secondary | ICD-10-CM | POA: Diagnosis not present

## 2015-05-03 DIAGNOSIS — I701 Atherosclerosis of renal artery: Secondary | ICD-10-CM | POA: Diagnosis not present

## 2015-05-03 DIAGNOSIS — E782 Mixed hyperlipidemia: Secondary | ICD-10-CM

## 2015-05-03 DIAGNOSIS — Z950 Presence of cardiac pacemaker: Secondary | ICD-10-CM

## 2015-05-03 MED ORDER — BENAZEPRIL HCL 10 MG PO TABS: 10.0000 mg | ORAL_TABLET | Freq: Every day | ORAL | Status: DC

## 2015-05-03 NOTE — Patient Instructions (Signed)
Dr Sallyanne Kuster has recommended making the following medication changes: 1. DECREASE Benazepril to 10 mg daily  Remote monitoring is used to monitor your Pacemaker of ICD from home. This monitoring reduces the number of office visits required to check your device to one time per year. It allows [place] to keep an eye on the functioning of your device to ensure it is working properly. You are scheduled for a device check from home on August 04, 2015. You may send your transmission at any time that day. If you have a wireless device, the transmission will be sent automatically. After your physician reviews your transmission, you will receive a postcard with your next transmission date.  Dr Sallyanne Kuster recommends that you schedule a follow-up appointment in 12 months with a device check. You will receive a reminder letter in the mail two months in advance. If you don't receive a letter, please call our office to schedule the follow-up appointment.  If you need a refill on your cardiac medications before your next appointment, please call your pharmacy.
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
## 2015-05-04 ENCOUNTER — Encounter: Payer: Self-pay | Admitting: *Deleted

## 2015-05-05 ENCOUNTER — Telehealth: Payer: Self-pay

## 2015-05-05 ENCOUNTER — Encounter: Payer: Self-pay | Admitting: Cardiovascular Disease

## 2015-05-05 DIAGNOSIS — Z5181 Encounter for therapeutic drug level monitoring: Secondary | ICD-10-CM

## 2015-05-05 DIAGNOSIS — I471 Supraventricular tachycardia: Secondary | ICD-10-CM | POA: Insufficient documentation

## 2015-05-05 DIAGNOSIS — E785 Hyperlipidemia, unspecified: Secondary | ICD-10-CM

## 2015-05-05 NOTE — Telephone Encounter (Signed)
Patient aware.  Labs ordered and mailed to patient.

## 2015-05-05 NOTE — Telephone Encounter (Signed)
-----   Message from Sanda Klein, MD sent at 05/05/2015 11:57 AM EDT ----- Lipid profile and CMET , please

## 2015-05-09 LAB — CUP PACEART INCLINIC DEVICE CHECK
Battery Impedance: 136 Ohm
Battery Voltage: 2.8 V
Brady Statistic AP VP Percent: 1 %
Brady Statistic AP VS Percent: 79 %
Brady Statistic AS VP Percent: 0 %
Date Time Interrogation Session: 20170329102358
Implantable Lead Implant Date: 20070906
Implantable Lead Location: 753860
Implantable Lead Model: 4092
Lead Channel Impedance Value: 676 Ohm
Lead Channel Setting Pacing Amplitude: 2 V
Lead Channel Setting Pacing Pulse Width: 0.4 ms
Lead Channel Setting Sensing Sensitivity: 5.6 mV
MDC IDC LEAD IMPLANT DT: 20070906
MDC IDC LEAD LOCATION: 753859
MDC IDC MSMT BATTERY REMAINING LONGEVITY: 142 mo
MDC IDC MSMT LEADCHNL RA IMPEDANCE VALUE: 433 Ohm
MDC IDC SET LEADCHNL RV PACING AMPLITUDE: 2.5 V
MDC IDC STAT BRADY AS VS PERCENT: 20 %

## 2015-05-15 ENCOUNTER — Encounter: Payer: Self-pay | Admitting: Cardiovascular Disease

## 2015-06-29 DIAGNOSIS — M5136 Other intervertebral disc degeneration, lumbar region: Secondary | ICD-10-CM | POA: Diagnosis not present

## 2015-06-29 DIAGNOSIS — M4806 Spinal stenosis, lumbar region: Secondary | ICD-10-CM | POA: Diagnosis not present

## 2015-06-29 DIAGNOSIS — M4726 Other spondylosis with radiculopathy, lumbar region: Secondary | ICD-10-CM | POA: Diagnosis not present

## 2015-06-30 DIAGNOSIS — N401 Enlarged prostate with lower urinary tract symptoms: Secondary | ICD-10-CM | POA: Diagnosis not present

## 2015-06-30 DIAGNOSIS — N138 Other obstructive and reflux uropathy: Secondary | ICD-10-CM | POA: Diagnosis not present

## 2015-06-30 DIAGNOSIS — R972 Elevated prostate specific antigen [PSA]: Secondary | ICD-10-CM | POA: Diagnosis not present

## 2015-06-30 DIAGNOSIS — N3941 Urge incontinence: Secondary | ICD-10-CM | POA: Diagnosis not present

## 2015-06-30 DIAGNOSIS — Z0000 Encounter for general adult medical examination without abnormal findings: Secondary | ICD-10-CM | POA: Diagnosis not present

## 2015-06-30 DIAGNOSIS — N3281 Overactive bladder: Secondary | ICD-10-CM | POA: Diagnosis not present

## 2015-07-04 ENCOUNTER — Emergency Department (HOSPITAL_COMMUNITY): Payer: Medicare Other

## 2015-07-04 ENCOUNTER — Observation Stay (HOSPITAL_COMMUNITY)
Admission: EM | Admit: 2015-07-04 | Discharge: 2015-07-05 | Disposition: A | Discharge status: Home / Self Care | Payer: Medicare Other | Attending: Cardiology | Admitting: Cardiology

## 2015-07-04 ENCOUNTER — Encounter (HOSPITAL_COMMUNITY): Payer: Self-pay | Admitting: Emergency Medicine

## 2015-07-04 DIAGNOSIS — K219 Gastro-esophageal reflux disease without esophagitis: Secondary | ICD-10-CM | POA: Insufficient documentation

## 2015-07-04 DIAGNOSIS — I471 Supraventricular tachycardia: Secondary | ICD-10-CM | POA: Diagnosis not present

## 2015-07-04 DIAGNOSIS — E7800 Pure hypercholesterolemia, unspecified: Secondary | ICD-10-CM | POA: Diagnosis not present

## 2015-07-04 DIAGNOSIS — I2510 Atherosclerotic heart disease of native coronary artery without angina pectoris: Secondary | ICD-10-CM | POA: Diagnosis not present

## 2015-07-04 DIAGNOSIS — R0789 Other chest pain: Secondary | ICD-10-CM | POA: Diagnosis not present

## 2015-07-04 DIAGNOSIS — I480 Paroxysmal atrial fibrillation: Secondary | ICD-10-CM | POA: Insufficient documentation

## 2015-07-04 DIAGNOSIS — F4310 Post-traumatic stress disorder, unspecified: Secondary | ICD-10-CM | POA: Diagnosis not present

## 2015-07-04 DIAGNOSIS — R011 Cardiac murmur, unspecified: Secondary | ICD-10-CM | POA: Insufficient documentation

## 2015-07-04 DIAGNOSIS — F419 Anxiety disorder, unspecified: Secondary | ICD-10-CM | POA: Diagnosis not present

## 2015-07-04 DIAGNOSIS — F329 Major depressive disorder, single episode, unspecified: Secondary | ICD-10-CM | POA: Insufficient documentation

## 2015-07-04 DIAGNOSIS — R072 Precordial pain: Secondary | ICD-10-CM

## 2015-07-04 DIAGNOSIS — G4730 Sleep apnea, unspecified: Secondary | ICD-10-CM | POA: Diagnosis not present

## 2015-07-04 DIAGNOSIS — I739 Peripheral vascular disease, unspecified: Secondary | ICD-10-CM | POA: Diagnosis not present

## 2015-07-04 DIAGNOSIS — G8929 Other chronic pain: Secondary | ICD-10-CM | POA: Insufficient documentation

## 2015-07-04 DIAGNOSIS — M1990 Unspecified osteoarthritis, unspecified site: Secondary | ICD-10-CM | POA: Insufficient documentation

## 2015-07-04 DIAGNOSIS — Z79899 Other long term (current) drug therapy: Secondary | ICD-10-CM | POA: Insufficient documentation

## 2015-07-04 DIAGNOSIS — N400 Enlarged prostate without lower urinary tract symptoms: Secondary | ICD-10-CM | POA: Diagnosis not present

## 2015-07-04 DIAGNOSIS — Z87891 Personal history of nicotine dependence: Secondary | ICD-10-CM | POA: Insufficient documentation

## 2015-07-04 DIAGNOSIS — I10 Essential (primary) hypertension: Secondary | ICD-10-CM | POA: Insufficient documentation

## 2015-07-04 DIAGNOSIS — K5090 Crohn's disease, unspecified, without complications: Secondary | ICD-10-CM | POA: Insufficient documentation

## 2015-07-04 DIAGNOSIS — R079 Chest pain, unspecified: Secondary | ICD-10-CM | POA: Diagnosis present

## 2015-07-04 HISTORY — DX: Personal history of other medical treatment: Z92.89

## 2015-07-04 HISTORY — DX: Personal history of systemic steroid therapy: Z92.241

## 2015-07-04 HISTORY — DX: Hemorrhagic condition, unspecified: D69.9

## 2015-07-04 HISTORY — DX: Low back pain: M54.5

## 2015-07-04 HISTORY — DX: Gastro-esophageal reflux disease without esophagitis: K21.9

## 2015-07-04 HISTORY — DX: Cardiac murmur, unspecified: R01.1

## 2015-07-04 HISTORY — DX: Low back pain, unspecified: M54.50

## 2015-07-04 HISTORY — DX: Sleep apnea, unspecified: G47.30

## 2015-07-04 HISTORY — DX: Unspecified osteoarthritis, unspecified site: M19.90

## 2015-07-04 HISTORY — DX: Claustrophobia: F40.240

## 2015-07-04 HISTORY — DX: Other chronic pain: G89.29

## 2015-07-04 LAB — BASIC METABOLIC PANEL
ANION GAP: 6 (ref 5–15)
BUN: 18 mg/dL (ref 6–20)
CO2: 28 mmol/L (ref 22–32)
Calcium: 8.5 mg/dL — ABNORMAL LOW (ref 8.9–10.3)
Chloride: 104 mmol/L (ref 101–111)
Creatinine, Ser: 0.96 mg/dL (ref 0.61–1.24)
Glucose, Bld: 108 mg/dL — ABNORMAL HIGH (ref 65–99)
Potassium: 4.2 mmol/L (ref 3.5–5.1)
Sodium: 138 mmol/L (ref 135–145)

## 2015-07-04 LAB — CBC WITH DIFFERENTIAL/PLATELET
Basophils Absolute: 0 10*3/uL (ref 0.0–0.1)
Basophils Relative: 0 %
Eosinophils Absolute: 0 10*3/uL (ref 0.0–0.7)
Eosinophils Relative: 0 %
HEMATOCRIT: 37.8 % — AB (ref 39.0–52.0)
Hemoglobin: 12.6 g/dL — ABNORMAL LOW (ref 13.0–17.0)
LYMPHS ABS: 1 10*3/uL (ref 0.7–4.0)
LYMPHS PCT: 9 %
MCH: 30.8 pg (ref 26.0–34.0)
MCHC: 33.3 g/dL (ref 30.0–36.0)
MCV: 92.4 fL (ref 78.0–100.0)
MONOS PCT: 14 %
Monocytes Absolute: 1.4 10*3/uL — ABNORMAL HIGH (ref 0.1–1.0)
NEUTROS ABS: 7.8 10*3/uL — AB (ref 1.7–7.7)
Neutrophils Relative %: 77 %
Platelets: 174 10*3/uL (ref 150–400)
RBC: 4.09 MIL/uL — AB (ref 4.22–5.81)
RDW: 13.4 % (ref 11.5–15.5)
WBC: 10.2 10*3/uL (ref 4.0–10.5)

## 2015-07-04 LAB — TROPONIN I
Troponin I: <0.03 ng/mL (ref <0.031)
Troponin I: <0.03 ng/mL (ref <0.031)

## 2015-07-04 MED ORDER — NITROGLYCERIN 0.4 MG SL SUBL
0.4000 mg | SUBLINGUAL_TABLET | SUBLINGUAL | Status: DC | PRN
  Administered 2015-07-04: 0.4 mg via SUBLINGUAL
  Filled 2015-07-04: qty 1

## 2015-07-04 MED ORDER — VENLAFAXINE HCL ER 150 MG PO CP24
150.0000 mg | ORAL_CAPSULE | Freq: Every day | ORAL | Status: DC
  Administered 2015-07-04 – 2015-07-05 (×2): 150 mg via ORAL
  Filled 2015-07-04 (×2): qty 1

## 2015-07-04 MED ORDER — GABAPENTIN 800 MG PO TABS
800.0000 mg | ORAL_TABLET | Freq: Three times a day (TID) | ORAL | Status: DC
  Filled 2015-07-04: qty 1

## 2015-07-04 MED ORDER — ASPIRIN 81 MG PO CHEW: 324.0000 mg | CHEWABLE_TABLET | Freq: Once | ORAL | Status: DC

## 2015-07-04 MED ORDER — BENAZEPRIL HCL 10 MG PO TABS
10.0000 mg | ORAL_TABLET | Freq: Every day | ORAL | Status: DC
  Administered 2015-07-04 – 2015-07-05 (×2): 10 mg via ORAL
  Filled 2015-07-04 (×2): qty 1

## 2015-07-04 MED ORDER — TRAZODONE HCL 100 MG PO TABS
200.0000 mg | ORAL_TABLET | Freq: Every day | ORAL | Status: DC
  Administered 2015-07-04: 200 mg via ORAL
  Filled 2015-07-04: qty 2

## 2015-07-04 MED ORDER — DIAZEPAM 5 MG PO TABS
10.0000 mg | ORAL_TABLET | Freq: Two times a day (BID) | ORAL | Status: DC | PRN
  Administered 2015-07-04: 10 mg via ORAL
  Filled 2015-07-04: qty 2

## 2015-07-04 MED ORDER — OXYBUTYNIN CHLORIDE ER 5 MG PO TB24
5.0000 mg | ORAL_TABLET | Freq: Three times a day (TID) | ORAL | Status: DC
  Administered 2015-07-04 – 2015-07-05 (×2): 5 mg via ORAL
  Filled 2015-07-04 (×4): qty 1

## 2015-07-04 MED ORDER — ASPIRIN 300 MG RE SUPP
300.0000 mg | RECTAL | Status: DC
  Filled 2015-07-04: qty 1

## 2015-07-04 MED ORDER — AMLODIPINE BESYLATE 5 MG PO TABS
5.0000 mg | ORAL_TABLET | Freq: Every day | ORAL | Status: DC
  Administered 2015-07-05: 5 mg via ORAL
  Filled 2015-07-04: qty 1

## 2015-07-04 MED ORDER — FINASTERIDE 5 MG PO TABS
5.0000 mg | ORAL_TABLET | Freq: Every day | ORAL | Status: DC
  Administered 2015-07-05: 5 mg via ORAL
  Filled 2015-07-04: qty 1

## 2015-07-04 MED ORDER — SULFASALAZINE 500 MG PO TABS
500.0000 mg | ORAL_TABLET | Freq: Two times a day (BID) | ORAL | Status: DC
  Administered 2015-07-04 – 2015-07-05 (×2): 500 mg via ORAL
  Filled 2015-07-04 (×3): qty 1

## 2015-07-04 MED ORDER — TIZANIDINE HCL 2 MG PO TABS
2.0000 mg | ORAL_TABLET | Freq: Three times a day (TID) | ORAL | Status: DC
  Administered 2015-07-04 – 2015-07-05 (×2): 2 mg via ORAL
  Filled 2015-07-04 (×4): qty 1

## 2015-07-04 MED ORDER — GABAPENTIN 400 MG PO CAPS
800.0000 mg | ORAL_CAPSULE | Freq: Three times a day (TID) | ORAL | Status: DC
  Administered 2015-07-04 – 2015-07-05 (×2): 800 mg via ORAL
  Filled 2015-07-04: qty 2

## 2015-07-04 MED ORDER — ZOLPIDEM TARTRATE 5 MG PO TABS
5.0000 mg | ORAL_TABLET | Freq: Every day | ORAL | Status: DC
  Administered 2015-07-04: 5 mg via ORAL
  Filled 2015-07-04: qty 1

## 2015-07-04 MED ORDER — ACETAMINOPHEN 325 MG PO TABS: 650.0000 mg | ORAL_TABLET | ORAL | Status: DC | PRN

## 2015-07-04 MED ORDER — HEPARIN SODIUM (PORCINE) 5000 UNIT/ML IJ SOLN
5000.0000 [IU] | Freq: Three times a day (TID) | INTRAMUSCULAR | Status: DC
  Administered 2015-07-04 – 2015-07-05 (×3): 5000 [IU] via SUBCUTANEOUS
  Filled 2015-07-04 (×3): qty 1

## 2015-07-04 MED ORDER — ONDANSETRON HCL 4 MG/2ML IJ SOLN: 4.0000 mg | Freq: Four times a day (QID) | INTRAMUSCULAR | Status: DC | PRN

## 2015-07-04 MED ORDER — ASPIRIN EC 81 MG PO TBEC
81.0000 mg | DELAYED_RELEASE_TABLET | Freq: Every day | ORAL | Status: DC
  Administered 2015-07-05: 81 mg via ORAL
  Filled 2015-07-04 (×2): qty 1

## 2015-07-04 MED ORDER — ASPIRIN EC 81 MG PO TBEC: 81.0000 mg | DELAYED_RELEASE_TABLET | Freq: Every day | ORAL | Status: DC

## 2015-07-04 MED ORDER — NITROGLYCERIN 0.4 MG SL SUBL: 0.4000 mg | SUBLINGUAL_TABLET | SUBLINGUAL | Status: DC | PRN

## 2015-07-04 MED ORDER — ASPIRIN 81 MG PO CHEW: 324.0000 mg | CHEWABLE_TABLET | ORAL | Status: DC

## 2015-07-04 MED ORDER — ASPIRIN 81 MG PO TABS: 81.0000 mg | ORAL_TABLET | Freq: Every day | ORAL | Status: DC

## 2015-07-04 MED ORDER — ZOLPIDEM TARTRATE 5 MG PO TABS: 10.0000 mg | ORAL_TABLET | Freq: Every day | ORAL | Status: DC

## 2015-07-04 MED ORDER — ATORVASTATIN CALCIUM 80 MG PO TABS
80.0000 mg | ORAL_TABLET | Freq: Every day | ORAL | Status: DC
Start: 2015-07-04 — End: 2015-07-05
  Administered 2015-07-04: 80 mg via ORAL
  Filled 2015-07-04: qty 1

## 2015-07-04 MED ORDER — ATENOLOL 25 MG PO TABS
100.0000 mg | ORAL_TABLET | Freq: Every day | ORAL | Status: DC
  Administered 2015-07-04 – 2015-07-05 (×2): 100 mg via ORAL
  Filled 2015-07-04 (×2): qty 4

## 2015-07-04 MED ORDER — TAMSULOSIN HCL 0.4 MG PO CAPS
0.4000 mg | ORAL_CAPSULE | Freq: Every day | ORAL | Status: DC
  Administered 2015-07-04: 0.4 mg via ORAL
  Filled 2015-07-04: qty 1

## 2015-07-04 MED ORDER — PANTOPRAZOLE SODIUM 40 MG PO TBEC
40.0000 mg | DELAYED_RELEASE_TABLET | Freq: Every day | ORAL | Status: DC
  Administered 2015-07-04 – 2015-07-05 (×2): 40 mg via ORAL
  Filled 2015-07-04: qty 1

## 2015-07-04 NOTE — ED Notes (Addendum)
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

## 2015-07-04 NOTE — ED Notes (Signed)
Cards into see pt

## 2015-07-04 NOTE — ED Notes (Signed)
Gave pt Ginger Ale, per [facility] - RN.

## 2015-07-04 NOTE — ED Provider Notes (Signed)
CSN: [id number]     Arrival date & time 07/04/15  1139 History   First MD Initiated Contact with Patient 07/04/15 1234     Chief Complaint  Patient presents with  . Chest Pain     (Consider location/radiation/quality/duration/timing/severity/associated sxs/prior Treatment) HPI  75 year old male with a history of CAD, PAD, and atrial fibrillation with a pacemaker presents with chest pain. Started at least one hour ago. Patient states he was out shopping and running errands when he all of a sudden developed a pressure on his chest. Some radiation to his left arm. Did not break and/or sweater have nausea or vomiting. Patient denies dyspnea but his son states that his wife said he was barely able to get out breaths and words. Was given 1 nitro by EMS, pain went from a 5 to a 2. Feels much better now. No back pain, abd pain or leg pain/swelling.   Past Medical History  Diagnosis Date  . Pacemaker   . PAF (paroxysmal atrial fibrillation) ([place])   . Anxiety   . Depression   . Hypertension   . Hypercholesteremia   . BPH (benign prostatic hyperplasia)   . Crohn disease ([place])   . PTSD (post-traumatic stress disorder)   . CAD (coronary artery disease)     a. BMS to LAD in 1999. b. stable cath in 2008, nuc in 2013 showing scar..  . PAD (peripheral artery disease) ([place])     a. s/p renal artery stenting (in 1999, with minimal restenosis by angio 2008, normal duplex in 2013)  . Paroxysmal atrial tachycardia ([place])   . Orthostasis   . Symptomatic bradycardia     a. s/p Medtronic Enrhythm in 2007 with generator change with a Medtronic Adapta device in May 2015. Of note has h/o ataxia/disorientation in 2015 in 2015 which coincided with pacer reaching ERI.  Marland Kitchen AAA (abdominal aortic aneurysm) [facility])    Past Surgical History  Procedure Laterality Date  . Coronary stent placement    . Renal stents      x 2  . Insert / replace / remove pacemaker    . Permanent pacemaker generator change N/A 06/10/2013     Procedure: PERMANENT PACEMAKER GENERATOR CHANGE;  Surgeon: Sanda Klein, MD;  Location: [place] CATH [facility];  Service: Cardiovascular;  Laterality: N/A;  . Carotid endarterectomy    . Laminectomy     Family History  Problem Relation Age of Onset  . Heart attack    . Cancer    . Heart disease Mother     before age 81  . AAA (abdominal aortic aneurysm) Father    Social History  Substance Use Topics  . Smoking status: Former Smoker -- 22 years    Quit date: 05/03/1972  . Smokeless tobacco: Never Used  . Alcohol Use: 0.0 oz/week    0 Standard drinks or equivalent per week     Comment: 1 to 2 beers once or twice a week    Review of Systems  Constitutional: Negative for diaphoresis.  Respiratory: Positive for shortness of breath.   Cardiovascular: Positive for chest pain.  Gastrointestinal: Negative for nausea, vomiting and abdominal pain.  Musculoskeletal: Negative for back pain.  Neurological: Positive for dizziness (chronic, not worse today).  All other systems reviewed and are negative.     Allergies  Review of patient's allergies indicates no known allergies.  Home Medications   Prior to Admission medications   Medication Sig Start Date End Date Taking? Authorizing Provider  amLODipine (NORVASC) 10  MG tablet Take 5 mg by mouth daily.     Historical Provider, MD  aspirin 81 MG tablet Take 81 mg by mouth daily.    Historical Provider, MD  atenolol (TENORMIN) 100 MG tablet Take 100 mg by mouth daily.    Historical Provider, MD  atorvastatin (LIPITOR) 80 MG tablet Take 80 mg by mouth daily.    Historical Provider, MD  benazepril (LOTENSIN) 10 MG tablet Take 1 tablet (10 mg total) by mouth daily. 05/03/15   Mihai Croitoru, MD  diazepam (VALIUM) 10 MG tablet Take 10 mg by mouth 2 (two) times daily as needed for anxiety.     Historical Provider, MD  finasteride (PROSCAR) 5 MG tablet Take 5 mg by mouth daily.    Historical Provider, MD  gabapentin (NEURONTIN) 800 MG tablet Take 1  tablet (800 mg total) by mouth 3 (three) times daily. 02/18/14   Marcial Pacas, MD  ketotifen (THERA TEARS ALLERGY) 0.025 % ophthalmic solution Place 1 drop into both eyes 3 (three) times daily.    Historical Provider, MD  omeprazole (PRILOSEC) 20 MG capsule Take 20 mg by mouth daily.    Historical Provider, MD  oxybutynin (DITROPAN-XL) 5 MG 24 hr tablet Take 5 mg by mouth 2 (two) times daily.    Historical Provider, MD  sulfaSALAzine (AZULFIDINE) 500 MG tablet Take 500 mg by mouth 2 (two) times daily.     Historical Provider, MD  tamsulosin (FLOMAX) 0.4 MG CAPS capsule Take 0.4 mg by mouth.    Historical Provider, MD  tiZANidine (ZANAFLEX) 2 MG tablet Take 1 tablet (2 mg total) by mouth 3 (three) times daily. 02/18/14   Marcial Pacas, MD  traZODone (DESYREL) 100 MG tablet Take 200 mg by mouth at bedtime.     Historical Provider, MD  venlafaxine XR (EFFEXOR-XR) 150 MG 24 hr capsule Take 150 mg by mouth daily.    Historical Provider, MD  zolpidem (AMBIEN) 10 MG tablet Take 10 mg by mouth at bedtime.     Historical Provider, MD   BP 135/94 mmHg  Pulse 62  Temp(Src) 99.2 F (37.3 C) (Oral)  Resp 15  SpO2 98% Physical Exam  Constitutional: He is oriented to person, place, and time. He appears well-developed and well-nourished.  HENT:  Head: Normocephalic and atraumatic.  Right Ear: External ear normal.  Left Ear: External ear normal.  Nose: Nose normal.  Eyes: Right eye exhibits no discharge. Left eye exhibits no discharge.  Neck: Neck supple.  Cardiovascular: Normal rate, regular rhythm, normal heart sounds and intact distal pulses.   Pulses:      Radial pulses are 2+ on the right side, and 2+ on the left side.  Pulmonary/Chest: Effort normal and breath sounds normal.  Abdominal: Soft. He exhibits no distension. There is no tenderness.  Musculoskeletal: He exhibits no edema.  Neurological: He is alert and oriented to person, place, and time.  Skin: Skin is warm and dry.  Nursing note and vitals  reviewed.   ED Course  Procedures (including critical care time) Labs Review Labs Reviewed  BASIC METABOLIC PANEL - Abnormal; Notable for the following:    Glucose, Bld 108 (*)    Calcium 8.5 (*)    All other components within normal limits  CBC WITH DIFFERENTIAL/PLATELET - Abnormal; Notable for the following:    RBC 4.09 (*)    Hemoglobin 12.6 (*)    HCT 37.8 (*)    Neutro Abs 7.8 (*)    Monocytes Absolute 1.4 (*)  All other components within normal limits  TROPONIN I    Imaging Review Dg Chest 2 View  07/04/2015  CLINICAL DATA:  Acute chest pain. EXAM: CHEST  2 VIEW COMPARISON:  November 19, 2007. FINDINGS: The heart size and mediastinal contours are within normal limits. Both lungs are clear. Atherosclerosis of thoracic aorta is noted. Right-sided pacemaker is unchanged in position. No pneumothorax or pleural effusion is noted. The visualized skeletal structures are unremarkable. IMPRESSION: No active cardiopulmonary disease. Electronically Signed   By: Marijo Conception, M.D.   On: 07/04/2015 13:28   I have personally reviewed and evaluated these images and lab results as part of my medical decision-making.   EKG Interpretation   Date/Time:  Tuesday Jul 04 2015 11:50:26 EDT Ventricular Rate:  61 PR Interval:  163 QRS Duration: 146 QT Interval:  454 QTC Calculation: 457 R Axis:   -16 Text Interpretation:  Sinus rhythm Atrial premature complex IVCD, consider  atypical RBBB LVH with IVCD and secondary repol abnrm Baseline wander in  lead(s) V3 Diffuse T wave inversions unchanged since 2015 Confirmed by  Albertus Chiarelli MD, Jaliah Foody [phone]) on 07/04/2015 12:03:31 PM      MDM   Final diagnoses:  Chest pain with high risk for cardiac etiology    Patient was given nitro x 1 here and pain now resolved. Concern for angina vs early NSTEMI given initial troponin drawn within 2 hours of onset. Pain free now. Admit to cards.     Sherwood Gambler, MD 07/04/15 1806

## 2015-07-04 NOTE — H&P (Signed)
History and Physical   Patient ID: Kevin Wiley MRN: [id number], DOB/AGE: 1940-03-28 75 y.o. Date of Encounter: 07/04/2015  Primary Physician: Geoffery Lyons, MD Primary Cardiologist: Dr Sallyanne Kuster  Chief Complaint:  Chest pain  HPI: Kevin Wiley is a 75 y.o. male with a history of PAF, HTN, HLD, BPH, RAS/stent, CAD w/ BMS LAD 1999.  Today at 10:00 am he began having chest pain while driving. He describes the pain as pressure, like something heavy is sitting on his chest. He rates it as 7/10 in intensity with slight radiation into his L arm. He was not SOB, nauseated or diaphoretic with it. He has not had symptoms like this before. His MI in 1999 was not associated with chest pain, he had abnormal EKG in his PCP office and was sent to hospital.   He drove back to his house and got his wife to call EMS. When EMS arrived he got ASA, SL NTG and the pain has eased off, currently pain-free.   He was most recently seen in office on 05/03/15, he told Dr Sallyanne Kuster he was having intermittent episodes of chest pressure, not exertional. Pt does not remember this.  Of note, he has balance issues, gets dizzy and has fallen at times. Balance is poor, causing the falls. He has back issues and suffers from chronic back pain.  No hx increased DOE, PND or orthopnea. Pt admits that his memory is poor.   He has had one episode of chest pain since being in the ED, that was relieved by SL Nitro.   Past Medical History  Diagnosis Date  . Pacemaker   . PAF (paroxysmal atrial fibrillation) ([place])   . Anxiety   . Depression   . Hypertension   . Hypercholesteremia   . BPH (benign prostatic hyperplasia)   . Crohn disease ([place])   . PTSD (post-traumatic stress disorder)   . CAD (coronary artery disease)     a. BMS to LAD in 1999. b. stable cath in 2008, nuc in 2013 showing scar..  . PAD (peripheral artery disease) ([place])     a. s/p renal artery stenting (in 1999, with minimal restenosis by angio 2008,  normal duplex in 2013)  . Paroxysmal atrial tachycardia ([place])   . Orthostasis   . Symptomatic bradycardia     a. s/p Medtronic Enrhythm in 2007 with generator change with a Medtronic Adapta device in May 2015. Of note has h/o ataxia/disorientation in 2015 in 2015 which coincided with pacer reaching ERI.  Marland Kitchen AAA (abdominal aortic aneurysm) [facility])     Surgical History:  Past Surgical History  Procedure Laterality Date  . Coronary stent placement  02/09/1997    3.0/8 Multi-Link BMS pLAD  . Renal artery stent  1999    x 2  . Insert / replace / remove pacemaker  2007  . Permanent pacemaker generator change N/A 06/10/2013    Procedure: PERMANENT PACEMAKER GENERATOR CHANGE;  Surgeon: Sanda Klein, MD; Generator Medtronic Adapta model number [place], serial number [id number]; Laterality: Right  . Carotid endarterectomy    . Laminectomy       I have reviewed the patient's current medications. Medication Sig  amLODipine (NORVASC) 10 MG tablet Take 5 mg by mouth daily.   aspirin 81 MG tablet Take 81 mg by mouth daily.  atenolol (TENORMIN) 100 MG tablet Take 100 mg by mouth daily.  atorvastatin (LIPITOR) 80 MG tablet Take 80 mg by mouth daily.  benazepril (LOTENSIN) 10 MG tablet  Take 1 tablet (10 mg total) by mouth daily.  diazepam (VALIUM) 10 MG tablet Take 10 mg by mouth 2 (two) times daily as needed for anxiety.   finasteride (PROSCAR) 5 MG tablet Take 5 mg by mouth daily.  gabapentin (NEURONTIN) 800 MG tablet Take 1 tablet (800 mg total) by mouth 3 (three) times daily.  omeprazole (PRILOSEC) 20 MG capsule Take 20 mg by mouth 2 (two) times daily before a meal.   oxybutynin (DITROPAN-XL) 5 MG 24 hr tablet Take 5 mg by mouth 3 (three) times daily.   sulfaSALAzine (AZULFIDINE) 500 MG tablet Take 500 mg by mouth 2 (two) times daily.   tamsulosin (FLOMAX) 0.4 MG CAPS capsule Take 0.4 mg by mouth.  tiZANidine (ZANAFLEX) 2 MG tablet Take 1 tablet (2 mg total) by mouth 3 (three) times daily.    traZODone (DESYREL) 100 MG tablet Take 200 mg by mouth at bedtime.   venlafaxine XR (EFFEXOR-XR) 150 MG 24 hr capsule Take 150 mg by mouth daily.  zolpidem (AMBIEN) 10 MG tablet Take 10 mg by mouth at bedtime.    Scheduled Meds: . aspirin  324 mg Oral Once   Continuous Infusions:  PRN Meds:.nitroGLYCERIN  Allergies: No Known Allergies  Social History   Social History  . Marital Status: Married    Spouse Name: N/A  . Number of Children: 1  . Years of Education: bs   Occupational History  . [employer] veteran     retired   Social History Main Topics  . Smoking status: Former Smoker -- 22 years    Quit date: 05/03/1972  . Smokeless tobacco: Never Used  . Alcohol Use: 0.0 oz/week    0 Standard drinks or equivalent per week     Comment: 1 to 2 beers once or twice a week  . Drug Use: No  . Sexual Activity: Not on file   Other Topics Concern  . Not on file   Social History Narrative   Patient lives at home with his wife.    Education - college   Right handed   Caffeine daily   Formerly drank too much, minimal ETOH last 10 years or so.    Family History  Problem Relation Age of Onset  . Heart attack Father     before age 75  . Cancer Mother   . Heart disease Father   . AAA (abdominal aortic aneurysm) Father    Family Status  Relation Status Death Age  . Mother Deceased 57  . Father Deceased 70    Review of Systems:   Full 14-point review of systems otherwise negative except as noted above.  Physical Exam: Blood pressure 133/86, pulse 60, temperature 99.2 F (37.3 C), temperature source Oral, resp. rate 15, SpO2 98 %. General: Well developed, well nourished,male in no acute distress. Head: Normocephalic, atraumatic, sclera non-icteric, no xanthomas, nares are without discharge. Dentition: poor Neck: No carotid bruits. JVD not elevated. No thyromegally Lungs: Good expansion bilaterally. Without wheezes or rhonchi.  Heart: Regular rate and rhythm with S1 S2.  No S3  or S4.  No murmur, no rubs, or gallops appreciated. Abdomen: Soft, non-tender, non-distended with normoactive bowel sounds. No hepatomegaly. No rebound/guarding. No obvious abdominal masses. Msk:  Strength and tone appear normal for age. No joint deformities or effusions, no spine or costo-vertebral angle tenderness. Extremities: No clubbing or cyanosis. No edema.  Distal pedal pulses are 2+ in 4 extrem Neuro: Alert and oriented X 3. Moves all extremities spontaneously. No  focal deficits noted. Psych:  Responds to questions appropriately with a normal affect. Skin: No rashes or lesions noted  Labs:   Lab Results  Component Value Date   WBC 10.2 07/04/2015   HGB 12.6* 07/04/2015   HCT 37.8* 07/04/2015   MCV 92.4 07/04/2015   PLT 174 07/04/2015     Recent Labs Lab 07/04/15 1241  NA 138  K 4.2  CL 104  CO2 28  BUN 18  CREATININE 0.96  CALCIUM 8.5*  GLUCOSE 108*    Recent Labs  07/04/15 1241  TROPONINI <0.03   Lab Results  Component Value Date   CHOL 216* 05/05/2013   HDL 56 05/05/2013   LDLCALC 133* 05/05/2013   TRIG 137 05/05/2013   Radiology/Studies: Dg Chest 2 View 07/04/2015  CLINICAL DATA:  Acute chest pain. EXAM: CHEST  2 VIEW COMPARISON:  November 19, 2007. FINDINGS: The heart size and mediastinal contours are within normal limits. Both lungs are clear. Atherosclerosis of thoracic aorta is noted. Right-sided pacemaker is unchanged in position. No pneumothorax or pleural effusion is noted. The visualized skeletal structures are unremarkable. IMPRESSION: No active cardiopulmonary disease. Electronically Signed   By: Marijo Conception, M.D.   On: 07/04/2015 13:28     Cardiac Cath: 05/19/2006 1. Left main normal. 2. LAD: This was a stent in the proximal LAD right after the first  diagonal branch which was widely patent. There was a 40-50%  stenosis in the LAD just proximal to the diagonal branch unchanged  from the prior angiogram performed in  1999. 3. Left circumflex: Free of significant disease. 4. Ramus branch: Free of disease. 5. Right coronary artery: A large dominant vessel with 30% proximal  stenosis and no damping. LEFT VENTRICULOGRAPHY: RAO left ventriculogram was performed using 25 mL of Visipaque dye at 12 mL per second. The overall LVEF was estimated at greater than 60% without focal wall motion abnormalities.  Echo: pending  ECG: A paced, diffuse T wave inversion noted in previous EKG  ASSESSMENT AND PLAN:  Active Problems:   * No active hospital problems. *  1. Angina: Patient presents with chest pain that occurred at rest, and has intermittent episodes of chest pain recently as reported in office notes. His EKG shows deep, diffuseT wave inversion, this is not new. He will need Echo as we do not have a recent one. His last cath was in 2008 (report above), there was 40-50% stenosis in the LAD. He did have a low risk Lexiscan Myoview in 2013.   He is a poor historian and admits that his memory is not always the best.   2. History of PAF: Patient not on any anticoagulation. Patient says he was told previously that he did not need it. He has a history of frequent falls, not the best candidate for anticoagulation.   This patients CHA2DS2-VASc Score and unadjusted Ischemic Stroke Rate (% per year) is equal to 2.2 % stroke rate/year from a score of 2 Above score calculated as 1 point each if present [CHF, HTN, DM, Vascular=MI/PAD/Aortic Plaque, Age if 65-74, or Male], 2 points each if present [Age > 75, or Stroke/TIA/TE]     Signed, Rosaria Ferries, PA-C 07/04/2015 2:50 PM Beeper [phone]  As above, patient seen and examined. Briefly he is a 75 year old male with past medical history of CAD, hypertension, hyperlipidemia, prior pacemaker placement with chest pain. Patient states he was driving today and developed chest pressure radiating to LUE. Not pleuritic, positional or related to food.  No  associated symptoms. Duration approximately one hour. Presently pain-free. Electrocardiogram shows atrial pacing, anterior/inferior T-wave inversion unchanged compared to previous.  Plan admit and R/O MI; if negative will plan nuclear study for risk stratification. Check echo. If abnormal would cath. Continue preadmission meds. Kirk Ruths

## 2015-07-05 ENCOUNTER — Observation Stay (HOSPITAL_BASED_OUTPATIENT_CLINIC_OR_DEPARTMENT_OTHER): Payer: Medicare Other

## 2015-07-05 ENCOUNTER — Observation Stay (HOSPITAL_COMMUNITY): Payer: Medicare Other

## 2015-07-05 ENCOUNTER — Other Ambulatory Visit: Payer: Self-pay | Admitting: Student

## 2015-07-05 DIAGNOSIS — G8929 Other chronic pain: Secondary | ICD-10-CM | POA: Diagnosis not present

## 2015-07-05 DIAGNOSIS — R079 Chest pain, unspecified: Secondary | ICD-10-CM

## 2015-07-05 DIAGNOSIS — R011 Cardiac murmur, unspecified: Secondary | ICD-10-CM | POA: Diagnosis not present

## 2015-07-05 DIAGNOSIS — I2510 Atherosclerotic heart disease of native coronary artery without angina pectoris: Secondary | ICD-10-CM | POA: Diagnosis not present

## 2015-07-05 LAB — NM MYOCAR MULTI W/SPECT W/WALL MOTION / EF
CHL CUP NUCLEAR SRS: 13
CHL CUP RESTING HR STRESS: 62 {beats}/min
CHL CUP STRESS STAGE 1 GRADE: 0 %
CHL CUP STRESS STAGE 1 HR: 60 {beats}/min
CHL CUP STRESS STAGE 1 SPEED: 0 mph
CHL CUP STRESS STAGE 2 HR: 60 {beats}/min
CHL CUP STRESS STAGE 3 SBP: 192 mmHg
CHL CUP STRESS STAGE 3 SPEED: 0 mph
CHL CUP STRESS STAGE 4 DBP: 104 mmHg
CHL CUP STRESS STAGE 4 GRADE: 0 %
CSEPED: 0 min
CSEPEDS: 0 s
CSEPEW: 1 METS
CSEPPBP: 156 mmHg
LVDIAVOL: 87 mL (ref 62–150)
LVSYSVOL: 43 mL
MPHR: 147 {beats}/min
NUC STRESS TID: 1.31
Peak HR: 68 {beats}/min
Percent HR: 48 %
Percent of predicted max HR: 46 %
RATE: 0.22
SDS: 3
SSS: 16
Stage 1 DBP: 109 mmHg
Stage 1 SBP: 168 mmHg
Stage 2 Grade: 0 %
Stage 2 Speed: 0 mph
Stage 3 DBP: 111 mmHg
Stage 3 Grade: 0 %
Stage 3 HR: 72 {beats}/min
Stage 4 HR: 68 {beats}/min
Stage 4 SBP: 156 mmHg
Stage 4 Speed: 0 mph

## 2015-07-05 LAB — ECHOCARDIOGRAM COMPLETE: Weight: 2832 oz

## 2015-07-05 LAB — TROPONIN I: Troponin I: <0.03 ng/mL (ref <0.031)

## 2015-07-05 MED ORDER — NITROGLYCERIN 0.4 MG SL SUBL: 0.4000 mg | SUBLINGUAL_TABLET | SUBLINGUAL | Status: DC | PRN

## 2015-07-05 MED ORDER — REGADENOSON 0.4 MG/5ML IV SOLN
0.4000 mg | Freq: Once | INTRAVENOUS | Status: DC
  Filled 2015-07-05: qty 5

## 2015-07-05 MED ORDER — TECHNETIUM TC 99M TETROFOSMIN IV KIT
10.0000 | PACK | Freq: Once | INTRAVENOUS | Status: AC | PRN
  Administered 2015-07-05: 10 via INTRAVENOUS

## 2015-07-05 MED ORDER — REGADENOSON 0.4 MG/5ML IV SOLN
INTRAVENOUS | Status: AC
  Administered 2015-07-05: 0.4 mg
  Filled 2015-07-05: qty 5

## 2015-07-05 MED ORDER — TECHNETIUM TC 99M TETROFOSMIN IV KIT
30.0000 | PACK | Freq: Once | INTRAVENOUS | Status: AC | PRN
  Administered 2015-07-05: 30 via INTRAVENOUS

## 2015-07-05 NOTE — Discharge Instructions (Signed)
Heart-Healthy Eating Plan °Many factors influence your heart health, including eating and exercise habits. Heart (coronary) risk increases with abnormal blood fat (lipid) levels. Heart-healthy meal planning includes limiting unhealthy fats, increasing healthy fats, and making other small dietary changes. This includes maintaining a healthy body weight to help keep lipid levels within a normal range. °WHAT IS MY PLAN?  °Your health care provider recommends that you: °· Get no more than _________% of the total calories in your daily diet from fat. °· Limit your intake of saturated fat to less than _________% of your total calories each day. °· Limit the amount of cholesterol in your diet to less than _________ mg per day. °WHAT TYPES OF FAT SHOULD I CHOOSE? °· Choose healthy fats more often. Choose monounsaturated and polyunsaturated fats, such as olive oil and canola oil, flaxseeds, walnuts, almonds, and seeds. °· Eat more omega-3 fats. Good choices include salmon, mackerel, sardines, tuna, flaxseed oil, and ground flaxseeds. Aim to eat fish at least two times each week. °· Limit saturated fats. Saturated fats are primarily found in animal products, such as meats, butter, and cream. Plant sources of saturated fats include palm oil, palm kernel oil, and coconut oil. °· Avoid foods with partially hydrogenated oils in them. These contain trans fats. Examples of foods that contain trans fats are stick margarine, some tub margarines, cookies, crackers, and other baked goods. °WHAT GENERAL GUIDELINES DO I NEED TO FOLLOW? °· Check food labels carefully to identify foods with trans fats or high amounts of saturated fat. °· Fill one half of your plate with vegetables and green salads. Eat 4-5 servings of vegetables per day. A serving of vegetables equals 1 cup of raw leafy vegetables, ½ cup of raw or cooked cut-up vegetables, or ½ cup of vegetable juice. °· Fill one fourth of your plate with whole grains. Look for the word  "whole" as the first word in the ingredient list. °· Fill one fourth of your plate with lean protein foods. °· Eat 4-5 servings of fruit per day. A serving of fruit equals one medium whole fruit, ¼ cup of dried fruit, ½ cup of fresh, frozen, or canned fruit, or ½ cup of 100% fruit juice. °· Eat more foods that contain soluble fiber. Examples of foods that contain this type of fiber are apples, broccoli, carrots, beans, peas, and barley. Aim to get 20-30 g of fiber per day. °· Eat more home-cooked food and less restaurant, buffet, and fast food. °· Limit or avoid alcohol. °· Limit foods that are high in starch and sugar. °· Avoid fried foods. °· Cook foods by using methods other than frying. Baking, boiling, grilling, and broiling are all great options. Other fat-reducing suggestions include: °¨ Removing the skin from poultry. °¨ Removing all visible fats from meats. °¨ Skimming the fat off of stews, soups, and gravies before serving them. °¨ Steaming vegetables in water or broth. °· Lose weight if you are overweight. Losing just 5-10% of your initial body weight can help your overall health and prevent diseases such as diabetes and heart disease. °· Increase your consumption of nuts, legumes, and seeds to 4-5 servings per week. One serving of dried beans or legumes equals ½ cup after being cooked, one serving of nuts equals 1½ ounces, and one serving of seeds equals ½ ounce or 1 tablespoon. °· You may need to monitor your salt (sodium) intake, especially if you have high blood pressure. Talk with your health care provider or dietitian to get   more information about reducing sodium. °WHAT FOODS CAN I EAT? °Grains °Breads, including French, white, pita, wheat, raisin, rye, oatmeal, and Italian. Tortillas that are neither fried nor made with lard or trans fat. Low-fat rolls, including hotdog and hamburger buns and English muffins. Biscuits. Muffins. Waffles. Pancakes. Light popcorn. Whole-grain cereals. Flatbread. Melba  toast. Pretzels. Breadsticks. Rusks. Low-fat snacks and crackers, including oyster, saltine, matzo, graham, animal, and rye. Rice and pasta, including [place] rice and those that are made with whole wheat. °Vegetables °All vegetables. °Fruits °All fruits, but limit coconut. °Meats and Other Protein Sources °Lean, well-trimmed beef, veal, pork, and lamb. Chicken and turkey without skin. All fish and shellfish. Wild duck, rabbit, pheasant, and venison. Egg whites or low-cholesterol egg substitutes. Dried beans, peas, lentils, and tofu. Seeds and most nuts. °Dairy °Low-fat or nonfat cheeses, including ricotta, string, and mozzarella. Skim or 1% milk that is liquid, powdered, or evaporated. Buttermilk that is made with low-fat milk. Nonfat or low-fat yogurt. °Beverages °Mineral water. Diet carbonated beverages. °Sweets and Desserts °Sherbets and fruit ices. Honey, jam, marmalade, jelly, and syrups. Meringues and gelatins. Pure sugar candy, such as hard candy, jelly beans, gumdrops, mints, marshmallows, and small amounts of dark chocolate. Angel food cake. °Eat all sweets and desserts in moderation. °Fats and Oils °Nonhydrogenated (trans-free) margarines. Vegetable oils, including soybean, sesame, sunflower, olive, peanut, safflower, corn, canola, and cottonseed. Salad dressings or mayonnaise that are made with a vegetable oil. Limit added fats and oils that you use for cooking, baking, salads, and as spreads. °Other °Cocoa powder. Coffee and tea. All seasonings and condiments. °The items listed above may not be a complete list of recommended foods or beverages. Contact your dietitian for more options. °WHAT FOODS ARE NOT RECOMMENDED? °Grains °Breads that are made with saturated or trans fats, oils, or whole milk. Croissants. Butter rolls. Cheese breads. Sweet rolls. Donuts. Buttered popcorn. Chow mein noodles. High-fat crackers, such as cheese or butter crackers. °Meats and Other Protein Sources °Fatty meats, such as  hotdogs, short ribs, sausage, spareribs, bacon, ribeye roast or steak, and mutton. High-fat deli meats, such as salami and bologna. Caviar. Domestic duck and goose. Organ meats, such as kidney, liver, sweetbreads, brains, gizzard, chitterlings, and heart. °Dairy °Cream, sour cream, cream cheese, and creamed cottage cheese. Whole milk cheeses, including blue (bleu), Monterey Jack, Brie, [place], American, Havarti, Swiss, cheddar, Camembert, and [place].  Whole or 2% milk that is liquid, evaporated, or condensed. Whole buttermilk. Cream sauce or high-fat cheese sauce. Yogurt that is made from whole milk. °Beverages °Regular sodas and drinks with added sugar. °Sweets and Desserts °Frosting. Pudding. Cookies. Cakes other than angel food cake. Candy that has milk chocolate or white chocolate, hydrogenated fat, butter, coconut, or unknown ingredients. Buttered syrups. Full-fat ice cream or ice cream drinks. °Fats and Oils °Gravy that has suet, meat fat, or shortening. Cocoa butter, hydrogenated oils, palm oil, coconut oil, palm kernel oil. These can often be found in baked products, candy, fried foods, nondairy creamers, and whipped toppings. Solid fats and shortenings, including bacon fat, salt pork, lard, and butter. Nondairy cream substitutes, such as coffee creamers and sour cream substitutes. Salad dressings that are made of unknown oils, cheese, or sour cream. °The items listed above may not be a complete list of foods and beverages to avoid. Contact your dietitian for more information. °  °This information is not intended to replace advice given to you by your health care provider. Make sure you discuss any questions you have with your health   care provider. °  °Document Released: 10/31/2007 Document Revised: 02/11/2014 Document Reviewed: 07/15/2013 °Elsevier Interactive Patient Education ©2016 [employer] ° °

## 2015-07-05 NOTE — Progress Notes (Signed)
This note also relates to the following rows which could not be included: ECG Heart Rate - Cannot attach notes to unvalidated device data   1 min, Hao PA at bedside, pt denies any CP/SOB

## 2015-07-05 NOTE — Discharge Summary (Signed)
Discharge Summary    Patient ID: Kevin Wiley,  MRN: [id number], DOB/AGE: 1940/11/23 75 y.o.  Admit date: 07/04/2015 Discharge date: 07/05/2015  Primary Care Provider: ARONSON,RICHARD A Primary Cardiologist: Dr. Sallyanne Kuster  Discharge Diagnoses    Active Problems:   Chest pain   Chest pain with high risk for cardiac etiology   Allergies No Known Allergies    History of Present Illness   Kevin Wiley is a 75 y.o. male with a history of PAF, HTN, HLD, BPH, RAS/stent, CAD w/ BMS LAD 1999.  On 07/04/15 at 10:00 am he began having chest pain while driving. He describes the pain as pressure, like something heavy is sitting on his chest. He rates it as 7/10 in intensity with slight radiation into his L arm. He was not SOB, nauseated or diaphoretic with it. Denies ever having any symptoms like this before. His MI in 1999 was not associated with chest pain, he had abnormal EKG in his PCP office and was sent to hospital.   He drove back to his house and got his wife to call EMS. When EMS arrived he got ASA, SL NTG and the pain has eased off, currently pain-free.   He was most recently seen in office on 05/03/15, he told Dr Sallyanne Kuster he was having intermittent episodes of chest pressure, not exertional. Pt does not remember this.  Of note, he has balance issues, gets dizzy and has fallen at times. Balance is poor, causing the falls. He has back issues and suffers from chronic back pain. No hx increased DOE, PND or orthopnea. Pt admits that his memory is poor.   He has had one episode of chest pain since being in the ED, that was relieved by SL Nitro.   Hospital Course  Patient was admitted for observation. He underwent Lexiscan Myoview. His EKG at baseline has anterior and inferior T wave inversion. This was unchanged from his baseline.   Nuclear study results are low risk. He had no further chest pain while admitted. He suffers from severe PTSD and became anxious and adamant to leave the  hospital. We will preform Echo outpatient, this has been scheduled.   He was hypertensive during admission with SBP in 150's to at times in the 190's. Consider increasing amlodipine to 68m daily.   He was seen by Dr. CSallyanne Kusterand deemed suitable for discharge. He will follow up in 2 weeks with our office.  _____________  Discharge Vitals Blood pressure 155/83, pulse 68, temperature 98.3 F (36.8 C), temperature source Oral, resp. rate 20, weight 177 lb (80.287 kg), SpO2 97 %.  Filed Weights   07/05/15 0451  Weight: 177 lb (80.287 kg)    Labs & Radiologic Studies     CBC  Recent Labs  07/04/15 1241  WBC 10.2  NEUTROABS 7.8*  HGB 12.6*  HCT 37.8*  MCV 92.4  PLT 1517  Basic Metabolic Panel  Recent Labs  07/04/15 1241  NA 138  K 4.2  CL 104  CO2 28  GLUCOSE 108*  BUN 18  CREATININE 0.96  CALCIUM 8.5*   Cardiac Enzymes  Recent Labs  07/04/15 1834 07/05/15 0020 07/05/15 1116  TROPONINI <0.03 <0.03 <0.03   Dg Chest 2 View  07/04/2015  CLINICAL DATA:  Acute chest pain. EXAM: CHEST  2 VIEW COMPARISON:  November 19, 2007. FINDINGS: The heart size and mediastinal contours are within normal limits. Both lungs are clear. Atherosclerosis of thoracic aorta is noted. Right-sided pacemaker  is unchanged in position. No pneumothorax or pleural effusion is noted. The visualized skeletal structures are unremarkable. IMPRESSION: No active cardiopulmonary disease. Electronically Signed   By: Marijo Conception, M.D.   On: 07/04/2015 13:28   Nm Myocar Multi W/spect W/wall Motion / Ef  07/05/2015   T wave inversion was noted during stress in the I, II, aVL, V2, V3, V4, V5 and V6 leads. T wave inversion persisted.  There was no ST segment deviation noted during stress.  Defect 1: There is a medium defect of mild severity present in the basal inferoseptal and mid inferoseptal location.  This is a low risk study.  Nuclear stress EF: 50%.  Low risk stress nuclear study with a fixed  inferoseptal defect, likely due to artifact, otherwise normal perfusion. No reversible ischemia is seen. Borderline left ventricular global systolic function.    Disposition   Pt is being discharged home today in good condition.  Follow-up Plans & Appointments    Follow-up Information                      Follow up with Sanda Klein, MD On 08/10/2015.   Specialty:  Cardiology   Why:  Cardiology Office Follow-Up 08/10/2015 at 11:00AM.   Contact information:   [address] [place]  [address] [phone]      Discharge Instructions    Diet - low sodium heart healthy    Complete by:  As directed      Increase activity slowly    Complete by:  As directed            Discharge Medications   Current Discharge Medication List    START taking these medications   Details  nitroGLYCERIN (NITROSTAT) 0.4 MG SL tablet Place 1 tablet (0.4 mg total) under the tongue every 5 (five) minutes as needed for chest pain (CP or SOB). Qty: 25 tablet, Refills: 3      CONTINUE these medications which have NOT CHANGED   Details  amLODipine (NORVASC) 10 MG tablet Take 5 mg by mouth daily.     aspirin 81 MG tablet Take 81 mg by mouth daily.    atenolol (TENORMIN) 100 MG tablet Take 100 mg by mouth daily.    atorvastatin (LIPITOR) 80 MG tablet Take 80 mg by mouth daily.    benazepril (LOTENSIN) 10 MG tablet Take 1 tablet (10 mg total) by mouth daily. Qty: 90 tablet, Refills: 3    diazepam (VALIUM) 10 MG tablet Take 10 mg by mouth 2 (two) times daily as needed for anxiety.     finasteride (PROSCAR) 5 MG tablet Take 5 mg by mouth daily.    gabapentin (NEURONTIN) 800 MG tablet Take 1 tablet (800 mg total) by mouth 3 (three) times daily. Qty: 270 tablet, Refills: 3    omeprazole (PRILOSEC) 20 MG capsule Take 20 mg by mouth 2 (two) times daily before a meal.     oxybutynin (DITROPAN-XL) 5 MG 24 hr tablet Take 5 mg by mouth 3 (three) times daily.     sulfaSALAzine  (AZULFIDINE) 500 MG tablet Take 500 mg by mouth 2 (two) times daily.     tamsulosin (FLOMAX) 0.4 MG CAPS capsule Take 0.4 mg by mouth.    tiZANidine (ZANAFLEX) 2 MG tablet Take 1 tablet (2 mg total) by mouth 3 (three) times daily. Qty: 270 tablet, Refills: 3    traZODone (DESYREL) 100 MG tablet Take 200 mg by mouth at bedtime.  venlafaxine XR (EFFEXOR-XR) 150 MG 24 hr capsule Take 150 mg by mouth daily.    zolpidem (AMBIEN) 10 MG tablet Take 10 mg by mouth at bedtime.            Outstanding Labs/Studies  Follow up as above with Dr. Sallyanne Kuster  Duration of Discharge Encounter   Greater than 30 minutes including physician time.  Signed, Arbutus Leas NP 07/05/2015, 3:55 PM

## 2015-07-05 NOTE — Progress Notes (Signed)
Patient Name: Kevin Wiley Date of Encounter: 07/05/2015  Active Problems:   Chest pain   Chest pain with high risk for cardiac etiology   Primary Cardiologist: Dr. Sallyanne Kuster Patient Profile: Kevin Wiley is a 75 y.o. male with a history of PAF, HTN, HLD, BPH, RAS/stent, CAD w/ BMS LAD 1999. He presented with acute onset chest pain while driving on 8/50/27 with radiation to left arm. Plan for nuclear study today.   SUBJECTIVE: Feels well this morning, denies chest pain and SOB.    OBJECTIVE Filed Vitals:   07/04/15 1810 07/04/15 1811 07/04/15 1939 07/05/15 0451  BP: 192/108 185/121 147/86 141/83  Pulse: 58  60 61  Temp: 98.1 F (36.7 C)  98.4 F (36.9 C) 98.3 F (36.8 C)  TempSrc: Oral  Oral Oral  Resp: 21  20 20   Weight:    177 lb (80.287 kg)  SpO2: 100%  99% 97%    Intake/Output Summary (Last 24 hours) at 07/05/15 7412 Last data filed at 07/05/15 0450  Gross per 24 hour  Intake    120 ml  Output    850 ml  Net   -730 ml   Filed Weights   07/05/15 0451  Weight: 177 lb (80.287 kg)    PHYSICAL EXAM General: Well developed, well nourished, male in no acute distress. Head: Normocephalic, atraumatic.  Neck: Supple without bruits, No JVD. Lungs:  Resp regular and unlabored, CTA. Heart: RRR, S1, S2, no S3, S4, or murmur; no rub. Abdomen: Soft, non-tender, non-distended, BS + x 4.  Extremities: No clubbing, cyanosis, No edema.  Neuro: Alert and oriented X 3. Moves all extremities spontaneously. Psych: Normal affect.  LABS: CBC: Recent Labs  07/04/15 1241  WBC 10.2  NEUTROABS 7.8*  HGB 12.6*  HCT 37.8*  MCV 92.4  PLT 878   Basic Metabolic Panel: Recent Labs  07/04/15 1241  NA 138  K 4.2  CL 104  CO2 28  GLUCOSE 108*  BUN 18  CREATININE 0.96  CALCIUM 8.5*   Cardiac Enzymes: Recent Labs  07/04/15 1241 07/04/15 1834 07/05/15 0020  TROPONINI <0.03 <0.03 <0.03     Current facility-administered medications:  .  acetaminophen (TYLENOL)  tablet 650 mg, 650 mg, Oral, Q4H PRN, Arbutus Leas, NP .  amLODipine (NORVASC) tablet 5 mg, 5 mg, Oral, Daily, Arbutus Leas, NP .  aspirin chewable tablet 324 mg, 324 mg, Oral, Once, Sherwood Gambler, MD, 324 mg at 07/04/15 1257 .  aspirin chewable tablet 324 mg, 324 mg, Oral, NOW **OR** aspirin suppository 300 mg, 300 mg, Rectal, NOW, Arbutus Leas, NP .  aspirin EC tablet 81 mg, 81 mg, Oral, Daily, Lelon Perla, MD .  atenolol (TENORMIN) tablet 100 mg, 100 mg, Oral, Daily, Arbutus Leas, NP, 100 mg at 07/04/15 1847 .  atorvastatin (LIPITOR) tablet 80 mg, 80 mg, Oral, q1800, Arbutus Leas, NP, 80 mg at 07/04/15 1847 .  benazepril (LOTENSIN) tablet 10 mg, 10 mg, Oral, Daily, Arbutus Leas, NP, 10 mg at 07/04/15 2122 .  diazepam (VALIUM) tablet 10 mg, 10 mg, Oral, BID PRN, Arbutus Leas, NP, 10 mg at 07/04/15 1848 .  finasteride (PROSCAR) tablet 5 mg, 5 mg, Oral, Daily, Arbutus Leas, NP .  gabapentin (NEURONTIN) capsule 800 mg, 800 mg, Oral, TID, Lelon Perla, MD, 800 mg at 07/04/15 2200 .  heparin injection 5,000 Units, 5,000 Units, Subcutaneous, Q8H, Arbutus Leas, NP, 5,000 Units at 07/05/15  [place].[facility]ooks .  nitroGLYCERIN (NITROSTAT) SL tablet 0.4 mg, 0.4 mg, Sublingual, Q5 min PRN, Sherwood Gambler, MD, 0.4 mg at 07/04/15 1257 .  nitroGLYCERIN (NITROSTAT) SL tablet 0.4 mg, 0.4 mg, Sublingual, Q5 Min x 3 PRN, Arbutus Leas, NP .  ondansetron (ZOFRAN) injection 4 mg, 4 mg, Intravenous, Q6H PRN, Arbutus Leas, NP .  oxybutynin (DITROPAN-XL) 24 hr tablet 5 mg, 5 mg, Oral, TID, Arbutus Leas, NP, 5 mg at 07/04/15 2120 .  pantoprazole (PROTONIX) EC tablet 40 mg, 40 mg, Oral, Daily, Arbutus Leas, NP, 40 mg at 07/04/15 2200 .  sulfaSALAzine (AZULFIDINE) tablet 500 mg, 500 mg, Oral, BID, Arbutus Leas, NP, 500 mg at 07/04/15 2120 .  tamsulosin (FLOMAX) capsule 0.4 mg, 0.4 mg, Oral, QPC supper, Arbutus Leas, NP, 0.4 mg at 07/04/15 1847 .  tiZANidine (ZANAFLEX) tablet 2 mg, 2 mg, Oral, TID, Arbutus Leas, NP, 2 mg at  07/04/15 2121 .  traZODone (DESYREL) tablet 200 mg, 200 mg, Oral, QHS, Arbutus Leas, NP, 200 mg at 07/04/15 2123 .  venlafaxine XR (EFFEXOR-XR) 24 hr capsule 150 mg, 150 mg, Oral, Daily, Arbutus Leas, NP, 150 mg at 07/04/15 2121 .  zolpidem (AMBIEN) tablet 5 mg, 5 mg, Oral, QHS, Lelon Perla, MD, 5 mg at 07/04/15 2123    TELE: A paced       ECG: A paced  Radiology/Studies: Dg Chest 2 View  07/04/2015  CLINICAL DATA:  Acute chest pain. EXAM: CHEST  2 VIEW COMPARISON:  November 19, 2007. FINDINGS: The heart size and mediastinal contours are within normal limits. Both lungs are clear. Atherosclerosis of thoracic aorta is noted. Right-sided pacemaker is unchanged in position. No pneumothorax or pleural effusion is noted. The visualized skeletal structures are unremarkable. IMPRESSION: No active cardiopulmonary disease. Electronically Signed   By: Marijo Conception, M.D.   On: 07/04/2015 13:28     Current Medications:  . amLODipine  5 mg Oral Daily  . aspirin  324 mg Oral Once  . aspirin  324 mg Oral NOW   Or  . aspirin  300 mg Rectal NOW  . aspirin EC  81 mg Oral Daily  . atenolol  100 mg Oral Daily  . atorvastatin  80 mg Oral q1800  . benazepril  10 mg Oral Daily  . finasteride  5 mg Oral Daily  . gabapentin  800 mg Oral TID  . heparin  5,000 Units Subcutaneous Q8H  . oxybutynin  5 mg Oral TID  . pantoprazole  40 mg Oral Daily  . sulfaSALAzine  500 mg Oral BID  . tamsulosin  0.4 mg Oral QPC supper  . tiZANidine  2 mg Oral TID  . traZODone  200 mg Oral QHS  . venlafaxine XR  150 mg Oral Daily  . zolpidem  5 mg Oral QHS      ASSESSMENT AND PLAN: Active Problems:   Chest pain   Chest pain with high risk for cardiac etiology  1. Chest pain with high risk for cardiac etiology: Patient is chest pain free, troponin negative x 3. Plan is for [employer] today. He has risk factors including prior CAD with BMS to LAD in 1999. Review of clinic notes shows that patient has had  intermittent chest pain over the past few months. We will await the results of study. Echo is pending as well.   2. HTN: BP is elevated this am, can increase ACE to 61m daily. Renal function  normal.   3. HLD: LDL is 133, patient on high intensity statin.    Signed, Arbutus Leas , NP 7:14 AM 07/05/2015 Pager [phone]  I have seen and examined the patient along with Arbutus Leas , NP.  I have reviewed the chart, notes and new data.  I agree with NP's note.  Key new complaints: no current complaints  Key examination changes: no overt CHF, no arrhythmia Key new findings / data: nuclear images are reviewed. There is a fixed inferoseptal defect that probably represents artifact. There is no reversible ischemia. Norma enzymes. ECG with severe abnormalities at baseline, no dynamic changes.  PLAN: OK for discharge with outpatient follow up and outpatient echo.  Sanda Klein, MD, [place] [phone] 07/05/2015, 4:04 PM

## 2015-07-05 NOTE — Progress Notes (Signed)
1 day lexiscan myoview completed without significant complication. Pending final result by [facility] reader.  Hilbert Corrigan PA Pager: [phone]

## 2015-07-05 NOTE — Progress Notes (Signed)
This note also relates to the following rows which could not be included: ECG Heart Rate - Cannot attach notes to unvalidated device data   3 min, Hao PA at bedside, pt denies any CP/SOB or any other associated symptoms

## 2015-07-05 NOTE — Progress Notes (Signed)
Patient adamantly refusing to have telemetry monitor replaced.  I spoke with patient and Dr. Recardo Evangelist in to speak with patient.  He still refuses telemetry monitor to be replaced.  CCMD notified.  Sanda Linger

## 2015-07-05 NOTE — Care Management Obs Status (Signed)
[facility] NOTIFICATION   Patient Details  Name: NOLYN GETTMAN MRN: [id number] Date of Birth: Jun 13, 1940   Medicare Observation Status Notification Given:  Yes    Bethena Roys, RN 07/05/2015, 2:43 PM

## 2015-07-05 NOTE — Progress Notes (Addendum)
This note also relates to the following rows which could not be included: ECG Heart Rate - Cannot attach notes to unvalidated device data   5 min, Hao PA at bedside, pt continues to deny any symptoms. Test ended

## 2015-07-05 NOTE — Progress Notes (Signed)
This note also relates to the following rows which could not be included: ECG Heart Rate - Cannot attach notes to unvalidated device data   Pretest vs

## 2015-07-05 NOTE — Progress Notes (Signed)
Echocardiogram 2D Echocardiogram has been performed.  Kevin Wiley, Kevin Wiley 07/05/2015, 4:31 PM

## 2015-07-06 DIAGNOSIS — S0180XA Unspecified open wound of other part of head, initial encounter: Secondary | ICD-10-CM | POA: Diagnosis not present

## 2015-07-12 DIAGNOSIS — S0180XD Unspecified open wound of other part of head, subsequent encounter: Secondary | ICD-10-CM | POA: Diagnosis not present

## 2015-07-16 DIAGNOSIS — R2681 Unsteadiness on feet: Secondary | ICD-10-CM | POA: Diagnosis not present

## 2015-07-16 DIAGNOSIS — S8001XA Contusion of right knee, initial encounter: Secondary | ICD-10-CM | POA: Diagnosis not present

## 2015-07-16 DIAGNOSIS — T148 Other injury of unspecified body region: Secondary | ICD-10-CM | POA: Diagnosis not present

## 2015-07-16 DIAGNOSIS — S8002XA Contusion of left knee, initial encounter: Secondary | ICD-10-CM | POA: Diagnosis not present

## 2015-07-16 DIAGNOSIS — S61402A Unspecified open wound of left hand, initial encounter: Secondary | ICD-10-CM | POA: Diagnosis not present

## 2015-07-16 DIAGNOSIS — S5001XA Contusion of right elbow, initial encounter: Secondary | ICD-10-CM | POA: Diagnosis not present

## 2015-07-16 DIAGNOSIS — S5002XA Contusion of left elbow, initial encounter: Secondary | ICD-10-CM | POA: Diagnosis not present

## 2015-07-16 DIAGNOSIS — M62830 Muscle spasm of back: Secondary | ICD-10-CM | POA: Diagnosis not present

## 2015-07-24 ENCOUNTER — Other Ambulatory Visit (HOSPITAL_COMMUNITY): Payer: Medicare Other

## 2015-07-28 DIAGNOSIS — M4726 Other spondylosis with radiculopathy, lumbar region: Secondary | ICD-10-CM | POA: Diagnosis not present

## 2015-07-28 DIAGNOSIS — M4806 Spinal stenosis, lumbar region: Secondary | ICD-10-CM | POA: Diagnosis not present

## 2015-07-28 DIAGNOSIS — M545 Low back pain: Secondary | ICD-10-CM | POA: Diagnosis not present

## 2015-07-28 DIAGNOSIS — I10 Essential (primary) hypertension: Secondary | ICD-10-CM | POA: Diagnosis not present

## 2015-07-28 DIAGNOSIS — M5416 Radiculopathy, lumbar region: Secondary | ICD-10-CM | POA: Diagnosis not present

## 2015-07-28 DIAGNOSIS — M5136 Other intervertebral disc degeneration, lumbar region: Secondary | ICD-10-CM | POA: Diagnosis not present

## 2015-07-28 DIAGNOSIS — Z6827 Body mass index (BMI) 27.0-27.9, adult: Secondary | ICD-10-CM | POA: Diagnosis not present

## 2015-08-07 ENCOUNTER — Telehealth: Payer: Self-pay | Admitting: Cardiology

## 2015-08-07 ENCOUNTER — Ambulatory Visit: Payer: Medicare Other | Admitting: *Deleted

## 2015-08-07 NOTE — Telephone Encounter (Signed)
LMOVM reminding pt to send remote transmission.   

## 2015-08-10 ENCOUNTER — Encounter: Payer: Self-pay | Admitting: Cardiovascular Disease

## 2015-08-10 ENCOUNTER — Ambulatory Visit (INDEPENDENT_AMBULATORY_CARE_PROVIDER_SITE_OTHER): Payer: Medicare Other | Admitting: Cardiovascular Disease

## 2015-08-10 VITALS — BP 111/80 | HR 71 | Ht 69.0 in | Wt 179.8 lb

## 2015-08-10 DIAGNOSIS — I4719 Other supraventricular tachycardia: Secondary | ICD-10-CM

## 2015-08-10 DIAGNOSIS — R001 Bradycardia, unspecified: Secondary | ICD-10-CM

## 2015-08-10 DIAGNOSIS — I471 Supraventricular tachycardia: Secondary | ICD-10-CM

## 2015-08-10 DIAGNOSIS — I10 Essential (primary) hypertension: Secondary | ICD-10-CM

## 2015-08-10 DIAGNOSIS — Z950 Presence of cardiac pacemaker: Secondary | ICD-10-CM

## 2015-08-10 DIAGNOSIS — E782 Mixed hyperlipidemia: Secondary | ICD-10-CM

## 2015-08-10 DIAGNOSIS — I701 Atherosclerosis of renal artery: Secondary | ICD-10-CM

## 2015-08-10 DIAGNOSIS — I951 Orthostatic hypotension: Secondary | ICD-10-CM

## 2015-08-10 DIAGNOSIS — R55 Syncope and collapse: Secondary | ICD-10-CM | POA: Diagnosis not present

## 2015-08-10 DIAGNOSIS — R404 Transient alteration of awareness: Secondary | ICD-10-CM | POA: Diagnosis not present

## 2015-08-10 DIAGNOSIS — I441 Atrioventricular block, second degree: Secondary | ICD-10-CM | POA: Diagnosis not present

## 2015-08-10 DIAGNOSIS — I2510 Atherosclerotic heart disease of native coronary artery without angina pectoris: Secondary | ICD-10-CM

## 2015-08-10 MED ORDER — BENAZEPRIL HCL 5 MG PO TABS: 5.0000 mg | ORAL_TABLET | Freq: Every day | ORAL | Status: DC
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
## 2015-08-10 NOTE — Patient Instructions (Signed)
Dr Sallyanne Kuster has recommended making the following medication changes: 1. DECREASE Benazepril to 5 mg daily  Remote monitoring is used to monitor your Pacemaker of ICD from home. This monitoring reduces the number of office visits required to check your device to one time per year. It allows [place] to keep an eye on the functioning of your device to ensure it is working properly. You are scheduled for a device check from home on Thursday, October 5th, 2017. You may send your transmission at any time that day. If you have a wireless device, the transmission will be sent automatically. After your physician reviews your transmission, you will receive a postcard with your next transmission date.  Dr Sallyanne Kuster recommends that you schedule a follow-up appointment in 6 months with a pacemaker check. You will receive a reminder letter in the mail two months in advance. If you don't receive a letter, please call our office to schedule the follow-up appointment.  If you need a refill on your cardiac medications before your next appointment, please call your pharmacy.

## 2015-08-14 ENCOUNTER — Telehealth: Payer: Self-pay | Admitting: Cardiovascular Disease

## 2015-08-14 NOTE — Telephone Encounter (Signed)
Have him stop benazepril altogether please

## 2015-08-14 NOTE — Addendum Note (Signed)
Addended by: Fidel Levy on: 08/14/2015 11:55 AM   Modules accepted: Orders, Medications

## 2015-08-14 NOTE — Telephone Encounter (Signed)
Returned call to patient. He called EMS on 7/6 due to low BP of 90/58  Patient states he thinks he passed out on 7/6 - he was not aware of anything around him - his son was yelling at him  EMS gave patient fluids, they wanted him to go to ED, but he "respectfully declined" When EMS left, his BP was 122/71  Patient reports his BP has been "running back a little higher" - he states which is good for him  At last MD appointment, benazepril was decreased He is continuing to monitor his BP He states his BP is hard to manage  He states he feels OK but has low energy levels - he thinks he is doing fine now He is drinking fluids when he feels dehydrated.   He just wanted to MD to know as FYI.

## 2015-08-14 NOTE — Telephone Encounter (Signed)
Patient notified of MD advice. Voiced understanding. Med list updated

## 2015-08-14 NOTE — Telephone Encounter (Signed)
Kevin Wiley is calling because his blood pressure dropped on 08/10/15 to 90/50 and had to call the  EMT . Was told to make Dr.Croitoru aware of any changes . Please call    Thanks

## 2015-08-22 LAB — CUP PACEART INCLINIC DEVICE CHECK
Battery Remaining Longevity: 141 mo
Battery Voltage: 2.8 V
Brady Statistic AP VP Percent: 1 %
Brady Statistic AS VP Percent: 0 %
Date Time Interrogation Session: 20170706130910
Implantable Lead Implant Date: 20070906
Implantable Lead Location: 753859
Implantable Lead Model: 4092
Implantable Lead Model: 5594
Lead Channel Impedance Value: 416 Ohm
Lead Channel Pacing Threshold Amplitude: 0.5 V
Lead Channel Pacing Threshold Pulse Width: 0.4 ms
Lead Channel Setting Pacing Amplitude: 2 V
Lead Channel Setting Pacing Amplitude: 2.5 V
Lead Channel Setting Sensing Sensitivity: 5.6 mV
MDC IDC LEAD IMPLANT DT: 20070906
MDC IDC LEAD LOCATION: 753860
MDC IDC MSMT BATTERY IMPEDANCE: 136 Ohm
MDC IDC MSMT LEADCHNL RA PACING THRESHOLD AMPLITUDE: 0.625 V
MDC IDC MSMT LEADCHNL RA PACING THRESHOLD PULSEWIDTH: 0.4 ms
MDC IDC MSMT LEADCHNL RA SENSING INTR AMPL: 2.8 mV
MDC IDC MSMT LEADCHNL RV IMPEDANCE VALUE: 627 Ohm
MDC IDC MSMT LEADCHNL RV SENSING INTR AMPL: 16 mV
MDC IDC SET LEADCHNL RV PACING PULSEWIDTH: 0.4 ms
MDC IDC STAT BRADY AP VS PERCENT: 79 %
MDC IDC STAT BRADY AS VS PERCENT: 20 %

## 2015-08-29 DIAGNOSIS — Z7982 Long term (current) use of aspirin: Secondary | ICD-10-CM | POA: Diagnosis not present

## 2015-08-29 DIAGNOSIS — L711 Rhinophyma: Secondary | ICD-10-CM | POA: Diagnosis not present

## 2015-08-29 DIAGNOSIS — Z79899 Other long term (current) drug therapy: Secondary | ICD-10-CM | POA: Diagnosis not present

## 2015-08-29 DIAGNOSIS — L820 Inflamed seborrheic keratosis: Secondary | ICD-10-CM | POA: Diagnosis not present

## 2015-08-29 DIAGNOSIS — L570 Actinic keratosis: Secondary | ICD-10-CM | POA: Diagnosis not present

## 2015-09-08 ENCOUNTER — Other Ambulatory Visit: Payer: Self-pay | Admitting: Internal Medicine

## 2015-09-08 DIAGNOSIS — R410 Disorientation, unspecified: Secondary | ICD-10-CM

## 2015-09-08 DIAGNOSIS — R296 Repeated falls: Secondary | ICD-10-CM | POA: Diagnosis not present

## 2015-09-08 DIAGNOSIS — Z6827 Body mass index (BMI) 27.0-27.9, adult: Secondary | ICD-10-CM | POA: Diagnosis not present

## 2015-09-08 DIAGNOSIS — R2689 Other abnormalities of gait and mobility: Secondary | ICD-10-CM | POA: Diagnosis not present

## 2015-09-08 DIAGNOSIS — I10 Essential (primary) hypertension: Secondary | ICD-10-CM | POA: Diagnosis not present

## 2015-09-11 ENCOUNTER — Ambulatory Visit
Admission: RE | Admit: 2015-09-11 | Discharge: 2015-09-11 | Disposition: A | Discharge status: Home / Self Care | Payer: Medicare Other | Source: Ambulatory Visit | Attending: Internal Medicine | Admitting: Internal Medicine

## 2015-09-11 DIAGNOSIS — R410 Disorientation, unspecified: Secondary | ICD-10-CM | POA: Diagnosis not present

## 2015-09-11 DIAGNOSIS — R55 Syncope and collapse: Secondary | ICD-10-CM | POA: Diagnosis not present

## 2015-09-14 DIAGNOSIS — M4306 Spondylolysis, lumbar region: Secondary | ICD-10-CM | POA: Diagnosis not present

## 2015-09-14 DIAGNOSIS — R2689 Other abnormalities of gait and mobility: Secondary | ICD-10-CM | POA: Diagnosis not present

## 2015-09-14 DIAGNOSIS — M545 Low back pain: Secondary | ICD-10-CM | POA: Diagnosis not present

## 2015-09-14 DIAGNOSIS — I10 Essential (primary) hypertension: Secondary | ICD-10-CM | POA: Diagnosis not present

## 2015-09-14 DIAGNOSIS — R296 Repeated falls: Secondary | ICD-10-CM | POA: Diagnosis not present

## 2015-09-19 DIAGNOSIS — Z6827 Body mass index (BMI) 27.0-27.9, adult: Secondary | ICD-10-CM | POA: Diagnosis not present

## 2015-09-19 DIAGNOSIS — M7989 Other specified soft tissue disorders: Secondary | ICD-10-CM | POA: Diagnosis not present

## 2015-09-19 DIAGNOSIS — L0889 Other specified local infections of the skin and subcutaneous tissue: Secondary | ICD-10-CM | POA: Diagnosis not present

## 2015-09-19 DIAGNOSIS — R2689 Other abnormalities of gait and mobility: Secondary | ICD-10-CM | POA: Diagnosis not present

## 2015-09-28 DIAGNOSIS — Z1389 Encounter for screening for other disorder: Secondary | ICD-10-CM | POA: Diagnosis not present

## 2015-09-28 DIAGNOSIS — I2510 Atherosclerotic heart disease of native coronary artery without angina pectoris: Secondary | ICD-10-CM | POA: Diagnosis not present

## 2015-09-28 DIAGNOSIS — F4310 Post-traumatic stress disorder, unspecified: Secondary | ICD-10-CM | POA: Diagnosis not present

## 2015-09-28 DIAGNOSIS — R413 Other amnesia: Secondary | ICD-10-CM | POA: Diagnosis not present

## 2015-09-28 DIAGNOSIS — K5010 Crohn's disease of large intestine without complications: Secondary | ICD-10-CM | POA: Diagnosis not present

## 2015-09-28 DIAGNOSIS — R2689 Other abnormalities of gait and mobility: Secondary | ICD-10-CM | POA: Diagnosis not present

## 2015-09-28 DIAGNOSIS — Z6827 Body mass index (BMI) 27.0-27.9, adult: Secondary | ICD-10-CM | POA: Diagnosis not present

## 2015-09-28 DIAGNOSIS — I10 Essential (primary) hypertension: Secondary | ICD-10-CM | POA: Diagnosis not present

## 2015-09-28 DIAGNOSIS — G4700 Insomnia, unspecified: Secondary | ICD-10-CM | POA: Diagnosis not present

## 2015-09-28 DIAGNOSIS — R296 Repeated falls: Secondary | ICD-10-CM | POA: Diagnosis not present

## 2015-09-28 DIAGNOSIS — M4306 Spondylolysis, lumbar region: Secondary | ICD-10-CM | POA: Diagnosis not present

## 2015-09-28 DIAGNOSIS — E784 Other hyperlipidemia: Secondary | ICD-10-CM | POA: Diagnosis not present

## 2015-10-03 ENCOUNTER — Other Ambulatory Visit: Payer: Self-pay | Admitting: Internal Medicine

## 2015-10-03 DIAGNOSIS — M4306 Spondylolysis, lumbar region: Secondary | ICD-10-CM

## 2015-10-03 DIAGNOSIS — G8929 Other chronic pain: Secondary | ICD-10-CM

## 2015-10-03 DIAGNOSIS — M545 Low back pain: Secondary | ICD-10-CM

## 2015-10-05 DIAGNOSIS — M4806 Spinal stenosis, lumbar region: Secondary | ICD-10-CM | POA: Diagnosis not present

## 2015-10-05 DIAGNOSIS — M5136 Other intervertebral disc degeneration, lumbar region: Secondary | ICD-10-CM | POA: Diagnosis not present

## 2015-10-05 DIAGNOSIS — M4726 Other spondylosis with radiculopathy, lumbar region: Secondary | ICD-10-CM | POA: Diagnosis not present

## 2015-10-11 ENCOUNTER — Other Ambulatory Visit: Payer: Medicare Other

## 2015-10-24 DIAGNOSIS — Z23 Encounter for immunization: Secondary | ICD-10-CM | POA: Diagnosis not present

## 2015-11-09 ENCOUNTER — Telehealth: Payer: Self-pay | Admitting: Cardiology

## 2015-11-09 ENCOUNTER — Encounter: Payer: Medicare Other | Admitting: *Deleted

## 2015-11-09 NOTE — Telephone Encounter (Signed)
Spoke with pt and reminded pt of remote transmission that is due today. Pt verbalized understanding.   

## 2015-11-10 ENCOUNTER — Encounter: Payer: Self-pay | Admitting: Cardiology

## 2015-11-27 ENCOUNTER — Ambulatory Visit (INDEPENDENT_AMBULATORY_CARE_PROVIDER_SITE_OTHER): Payer: Medicare Other | Admitting: *Deleted

## 2015-11-27 DIAGNOSIS — I495 Sick sinus syndrome: Secondary | ICD-10-CM

## 2015-11-28 ENCOUNTER — Encounter: Payer: Self-pay | Admitting: Cardiology

## 2015-11-28 NOTE — Progress Notes (Signed)
Remote pacemaker transmission.   

## 2015-12-27 LAB — CUP PACEART REMOTE DEVICE CHECK
Battery Impedance: 160 Ohm
Battery Voltage: 2.8 V
Brady Statistic AP VP Percent: 1 %
Brady Statistic AP VS Percent: 78 %
Brady Statistic AS VP Percent: 0 %
Brady Statistic AS VS Percent: 21 %
Implantable Lead Implant Date: 20070906
Implantable Lead Implant Date: 20070906
Implantable Lead Location: 753860
Implantable Lead Model: 4092
Implantable Lead Model: 5594
Lead Channel Impedance Value: 657 Ohm
Lead Channel Pacing Threshold Amplitude: 0.625 V
Lead Channel Pacing Threshold Pulse Width: 0.4 ms
Lead Channel Pacing Threshold Pulse Width: 0.4 ms
Lead Channel Sensing Intrinsic Amplitude: 16 mV
Lead Channel Sensing Intrinsic Amplitude: 2.8 mV
Lead Channel Setting Pacing Amplitude: 2 V
MDC IDC LEAD LOCATION: 753859
MDC IDC MSMT BATTERY REMAINING LONGEVITY: 136 mo
MDC IDC MSMT LEADCHNL RA IMPEDANCE VALUE: 427 Ohm
MDC IDC MSMT LEADCHNL RV PACING THRESHOLD AMPLITUDE: 0.5 V
MDC IDC PG IMPLANT DT: 20150507
MDC IDC SESS DTM: 20171021124212
MDC IDC SET LEADCHNL RV PACING AMPLITUDE: 2.5 V
MDC IDC SET LEADCHNL RV PACING PULSEWIDTH: 0.4 ms
MDC IDC SET LEADCHNL RV SENSING SENSITIVITY: 5.6 mV

## 2016-01-14 DIAGNOSIS — S0083XA Contusion of other part of head, initial encounter: Secondary | ICD-10-CM | POA: Diagnosis not present

## 2016-01-14 DIAGNOSIS — R52 Pain, unspecified: Secondary | ICD-10-CM | POA: Diagnosis not present

## 2016-01-16 DIAGNOSIS — Z6827 Body mass index (BMI) 27.0-27.9, adult: Secondary | ICD-10-CM | POA: Diagnosis not present

## 2016-01-16 DIAGNOSIS — R2689 Other abnormalities of gait and mobility: Secondary | ICD-10-CM | POA: Diagnosis not present

## 2016-01-16 DIAGNOSIS — R52 Pain, unspecified: Secondary | ICD-10-CM | POA: Diagnosis not present

## 2016-01-16 DIAGNOSIS — R0602 Shortness of breath: Secondary | ICD-10-CM | POA: Diagnosis not present

## 2016-01-16 DIAGNOSIS — R0781 Pleurodynia: Secondary | ICD-10-CM | POA: Diagnosis not present

## 2016-01-16 DIAGNOSIS — M4306 Spondylolysis, lumbar region: Secondary | ICD-10-CM | POA: Diagnosis not present

## 2016-02-26 ENCOUNTER — Telehealth: Payer: Self-pay | Admitting: Cardiology

## 2016-02-26 ENCOUNTER — Ambulatory Visit: Payer: Medicare Other | Admitting: *Deleted

## 2016-02-26 NOTE — Telephone Encounter (Signed)
LMOVM reminding pt to send remote transmission.   

## 2016-02-27 NOTE — Progress Notes (Signed)
Remote PPM not received.

## 2016-03-01 ENCOUNTER — Encounter: Payer: Self-pay | Admitting: Cardiology

## 2016-03-08 DIAGNOSIS — I10 Essential (primary) hypertension: Secondary | ICD-10-CM | POA: Diagnosis not present

## 2016-03-08 DIAGNOSIS — Z125 Encounter for screening for malignant neoplasm of prostate: Secondary | ICD-10-CM | POA: Diagnosis not present

## 2016-03-08 DIAGNOSIS — E784 Other hyperlipidemia: Secondary | ICD-10-CM | POA: Diagnosis not present

## 2016-03-13 DIAGNOSIS — R2689 Other abnormalities of gait and mobility: Secondary | ICD-10-CM | POA: Diagnosis not present

## 2016-03-13 DIAGNOSIS — Z0000 Encounter for general adult medical examination without abnormal findings: Secondary | ICD-10-CM | POA: Diagnosis not present

## 2016-03-13 DIAGNOSIS — Z79899 Other long term (current) drug therapy: Secondary | ICD-10-CM | POA: Diagnosis not present

## 2016-03-13 DIAGNOSIS — F4310 Post-traumatic stress disorder, unspecified: Secondary | ICD-10-CM | POA: Diagnosis not present

## 2016-03-13 DIAGNOSIS — K5010 Crohn's disease of large intestine without complications: Secondary | ICD-10-CM | POA: Diagnosis not present

## 2016-03-13 DIAGNOSIS — I714 Abdominal aortic aneurysm, without rupture: Secondary | ICD-10-CM | POA: Diagnosis not present

## 2016-03-13 DIAGNOSIS — I10 Essential (primary) hypertension: Secondary | ICD-10-CM | POA: Diagnosis not present

## 2016-03-13 DIAGNOSIS — Z1389 Encounter for screening for other disorder: Secondary | ICD-10-CM | POA: Diagnosis not present

## 2016-03-13 DIAGNOSIS — M545 Low back pain: Secondary | ICD-10-CM | POA: Diagnosis not present

## 2016-03-13 DIAGNOSIS — M4306 Spondylolysis, lumbar region: Secondary | ICD-10-CM | POA: Diagnosis not present

## 2016-03-13 DIAGNOSIS — D692 Other nonthrombocytopenic purpura: Secondary | ICD-10-CM | POA: Diagnosis not present

## 2016-03-13 DIAGNOSIS — Z950 Presence of cardiac pacemaker: Secondary | ICD-10-CM | POA: Diagnosis not present

## 2016-03-13 DIAGNOSIS — R413 Other amnesia: Secondary | ICD-10-CM | POA: Diagnosis not present

## 2016-03-14 DIAGNOSIS — M5136 Other intervertebral disc degeneration, lumbar region: Secondary | ICD-10-CM | POA: Diagnosis not present

## 2016-03-14 DIAGNOSIS — M4726 Other spondylosis with radiculopathy, lumbar region: Secondary | ICD-10-CM | POA: Diagnosis not present

## 2016-03-14 DIAGNOSIS — M48061 Spinal stenosis, lumbar region without neurogenic claudication: Secondary | ICD-10-CM | POA: Diagnosis not present

## 2016-03-29 ENCOUNTER — Encounter: Payer: Self-pay | Admitting: Cardiology

## 2016-04-10 DIAGNOSIS — S2249XA Multiple fractures of ribs, unspecified side, initial encounter for closed fracture: Secondary | ICD-10-CM | POA: Diagnosis not present

## 2016-04-19 DIAGNOSIS — Z6826 Body mass index (BMI) 26.0-26.9, adult: Secondary | ICD-10-CM | POA: Diagnosis not present

## 2016-04-19 DIAGNOSIS — R0781 Pleurodynia: Secondary | ICD-10-CM | POA: Diagnosis not present

## 2016-04-19 DIAGNOSIS — R2689 Other abnormalities of gait and mobility: Secondary | ICD-10-CM | POA: Diagnosis not present

## 2016-06-27 DIAGNOSIS — M48061 Spinal stenosis, lumbar region without neurogenic claudication: Secondary | ICD-10-CM | POA: Diagnosis not present

## 2016-06-27 DIAGNOSIS — M5136 Other intervertebral disc degeneration, lumbar region: Secondary | ICD-10-CM | POA: Diagnosis not present

## 2016-06-27 DIAGNOSIS — M4726 Other spondylosis with radiculopathy, lumbar region: Secondary | ICD-10-CM | POA: Diagnosis not present

## 2016-07-11 DIAGNOSIS — R351 Nocturia: Secondary | ICD-10-CM | POA: Diagnosis not present

## 2016-07-11 DIAGNOSIS — N3281 Overactive bladder: Secondary | ICD-10-CM | POA: Diagnosis not present

## 2016-07-11 DIAGNOSIS — N401 Enlarged prostate with lower urinary tract symptoms: Secondary | ICD-10-CM | POA: Diagnosis not present

## 2016-07-11 DIAGNOSIS — N3941 Urge incontinence: Secondary | ICD-10-CM | POA: Diagnosis not present

## 2016-08-06 ENCOUNTER — Telehealth: Payer: Self-pay

## 2016-08-06 ENCOUNTER — Telehealth: Payer: Self-pay | Admitting: Cardiovascular Disease

## 2016-08-06 DIAGNOSIS — Z125 Encounter for screening for malignant neoplasm of prostate: Secondary | ICD-10-CM | POA: Diagnosis not present

## 2016-08-06 DIAGNOSIS — N3941 Urge incontinence: Secondary | ICD-10-CM | POA: Diagnosis not present

## 2016-08-06 DIAGNOSIS — N3281 Overactive bladder: Secondary | ICD-10-CM | POA: Diagnosis not present

## 2016-08-06 DIAGNOSIS — R311 Benign essential microscopic hematuria: Secondary | ICD-10-CM | POA: Diagnosis not present

## 2016-08-06 DIAGNOSIS — N401 Enlarged prostate with lower urinary tract symptoms: Secondary | ICD-10-CM | POA: Diagnosis not present

## 2016-08-06 DIAGNOSIS — R3121 Asymptomatic microscopic hematuria: Secondary | ICD-10-CM | POA: Diagnosis not present

## 2016-08-06 NOTE — Telephone Encounter (Signed)
appt scheduled 7/6 with Rosaria Ferries PA, see other telephone note from 7/3.   Patient aware

## 2016-08-06 NOTE — Telephone Encounter (Signed)
New Message     Pt c/o Shortness Of Breath: STAT if SOB developed within the last 24 hours or pt is noticeably SOB on the phone  1. Are you currently SOB (can you hear that pt is SOB on the phone)?  A little   2. How long have you been experiencing SOB?  awhile  3. Are you SOB when sitting or when up moving around? Both  4. Are you currently experiencing any other symptoms?  Passed out a week ago

## 2016-08-06 NOTE — Telephone Encounter (Signed)
He has had neurally mediated syncope in the past.

## 2016-08-06 NOTE — Telephone Encounter (Signed)
Transmission received, normal pacemaker function 4.6% AT/AF burden. [address] [place] since 06/2014.  Most recent [place] <10sec. HR histogram shifted slightly to the right. Informed pt that pacemaker was functioning normally. Pt scheduled  for apt w/ Rosaria Ferries 08/09/2016 for f/u of syncopal episode~ pt aware of appointment.

## 2016-08-06 NOTE — Telephone Encounter (Signed)
Spoke w/ pt and walked him through a manual transmission. Transmission successful on patient end. Informed pt that once the transmission is received on our end a Device Tech RN will review and call him back. Pt verbalized understanding.

## 2016-08-06 NOTE — Telephone Encounter (Signed)
Received call from patient stating he is experiencing SOB and extreme fatigue for "quite a while".   Reports last week (unsure what day or time) he was standing at the front door and passed out, witnessed by wife, +LOC for a "few seconds".   Denies CP, SOB, palpitations, N/V/D prior to episode.  Reports he is so tired he cannot hardly do any type of activity.  This has been going on "for a while now".   Reports having many falls recently.  Denies taking BP and HR at home.  Denies being out in the heat and staying hydrated.     Per chart review.  PPM with remote capabilty.  Last ov with Dr. Sallyanne Kuster:  He has occasional lightheadedness and has had several falls, but none in the last 3 months. He seems to have prominent symptoms of orthostatic hypotension. He has rare episodes of chest pressure that occur randomly and are not associated with activity. He has not required sublingual nitroglycerin this year. He has not experienced true syncope and does not have palpitations. He denies exertional dyspnea   Due for follow up-has already been scheduled 7/25 with Dr. Sallyanne Kuster.  Patient currently asymptomatic other than fatigue, able to speak in complete, full sentences, no CP at current.    Advised I would reach out to device nurse to assist in transmission to review.    Patient reports going to Urologist today at 12:45

## 2016-08-09 ENCOUNTER — Encounter: Payer: Self-pay | Admitting: Physician Assistant

## 2016-08-09 ENCOUNTER — Ambulatory Visit (INDEPENDENT_AMBULATORY_CARE_PROVIDER_SITE_OTHER): Payer: Medicare Other | Admitting: Physician Assistant

## 2016-08-09 VITALS — BP 142/80 | HR 80 | Ht 69.0 in | Wt 174.0 lb

## 2016-08-09 DIAGNOSIS — I701 Atherosclerosis of renal artery: Secondary | ICD-10-CM

## 2016-08-09 DIAGNOSIS — I471 Supraventricular tachycardia: Secondary | ICD-10-CM

## 2016-08-09 DIAGNOSIS — I739 Peripheral vascular disease, unspecified: Secondary | ICD-10-CM | POA: Diagnosis not present

## 2016-08-09 DIAGNOSIS — R55 Syncope and collapse: Secondary | ICD-10-CM

## 2016-08-09 NOTE — Progress Notes (Signed)
Thank you, I agree. MCr
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
## 2016-08-09 NOTE — Patient Instructions (Addendum)
Medication Instructions:  Your physician recommends that you continue on your current medications as directed. Please refer to the Current Medication list given to you today.  If you need a refill on your cardiac medications before your next appointment, please call your pharmacy.  Testing/Procedures: Your physician has requested that you have an ankle brachial index (ABI). During this test an ultrasound and blood pressure cuff are used to evaluate the arteries that supply the arms and legs with blood. Allow thirty minutes for this exam. There are no restrictions or special instructions.  Your physician has requested that you have a renal artery duplex. During this test, an ultrasound is used to evaluate blood flow to the kidneys. Allow one hour for this exam. Do not eat after midnight the day before and avoid carbonated beverages. Take your medications as you usually do.  Follow-Up: Your physician wants you to follow-up in: 6 MONTHS WITH DR CROITORU, WE WILL CALL IF ANY TESTING IS ABNORMAL. You will receive a reminder letter in the mail two months in advance. If you don't receive a letter, please call our office November 2018 to schedule the January 2019 follow-up appointment.  Special Instructions: WEAR COMPRESSION STOCKINGS  INCREASE HYDRATION TO 6 GLASSES OF WATER DAILY, TRY DRINKING 2 GLASSES AT YOUR 3 MEALS THEN AS TOLERATED  THE REST OF THE DAY  DO NOT STAND FOR MORE THAN A FEW MINUTES AT A TIME-IF YOU GO OUTSIDE MAKE SURE TO DRINK A GLASS OF WATER FIRST.   Thank you for choosing [facility] at [facility]!!

## 2016-08-19 DIAGNOSIS — R310 Gross hematuria: Secondary | ICD-10-CM | POA: Diagnosis not present

## 2016-08-19 DIAGNOSIS — R311 Benign essential microscopic hematuria: Secondary | ICD-10-CM | POA: Diagnosis not present

## 2016-08-26 ENCOUNTER — Other Ambulatory Visit: Payer: Self-pay | Admitting: Physician Assistant

## 2016-08-26 DIAGNOSIS — R351 Nocturia: Secondary | ICD-10-CM | POA: Diagnosis not present

## 2016-08-26 DIAGNOSIS — R311 Benign essential microscopic hematuria: Secondary | ICD-10-CM | POA: Diagnosis not present

## 2016-08-26 DIAGNOSIS — I739 Peripheral vascular disease, unspecified: Secondary | ICD-10-CM

## 2016-08-26 DIAGNOSIS — N401 Enlarged prostate with lower urinary tract symptoms: Secondary | ICD-10-CM | POA: Diagnosis not present

## 2016-08-26 DIAGNOSIS — R350 Frequency of micturition: Secondary | ICD-10-CM | POA: Diagnosis not present

## 2016-08-26 DIAGNOSIS — R3915 Urgency of urination: Secondary | ICD-10-CM | POA: Diagnosis not present

## 2016-08-28 ENCOUNTER — Encounter: Payer: Self-pay | Admitting: Cardiovascular Disease

## 2016-08-28 ENCOUNTER — Ambulatory Visit (INDEPENDENT_AMBULATORY_CARE_PROVIDER_SITE_OTHER): Payer: Medicare Other | Admitting: Cardiovascular Disease

## 2016-08-28 VITALS — BP 162/102 | HR 70 | Ht 69.0 in | Wt 171.6 lb

## 2016-08-28 DIAGNOSIS — F419 Anxiety disorder, unspecified: Secondary | ICD-10-CM | POA: Diagnosis not present

## 2016-08-28 DIAGNOSIS — I951 Orthostatic hypotension: Secondary | ICD-10-CM

## 2016-08-28 DIAGNOSIS — I10 Essential (primary) hypertension: Secondary | ICD-10-CM | POA: Diagnosis not present

## 2016-08-28 DIAGNOSIS — I441 Atrioventricular block, second degree: Secondary | ICD-10-CM

## 2016-08-28 DIAGNOSIS — E782 Mixed hyperlipidemia: Secondary | ICD-10-CM | POA: Diagnosis not present

## 2016-08-28 DIAGNOSIS — F4310 Post-traumatic stress disorder, unspecified: Secondary | ICD-10-CM

## 2016-08-28 DIAGNOSIS — I701 Atherosclerosis of renal artery: Secondary | ICD-10-CM

## 2016-08-28 DIAGNOSIS — I2510 Atherosclerotic heart disease of native coronary artery without angina pectoris: Secondary | ICD-10-CM

## 2016-08-28 DIAGNOSIS — Z950 Presence of cardiac pacemaker: Secondary | ICD-10-CM

## 2016-08-28 DIAGNOSIS — R001 Bradycardia, unspecified: Secondary | ICD-10-CM | POA: Diagnosis not present

## 2016-08-28 DIAGNOSIS — R269 Unspecified abnormalities of gait and mobility: Secondary | ICD-10-CM | POA: Diagnosis not present

## 2016-08-28 DIAGNOSIS — F329 Major depressive disorder, single episode, unspecified: Secondary | ICD-10-CM | POA: Insufficient documentation

## 2016-08-28 DIAGNOSIS — I471 Supraventricular tachycardia: Secondary | ICD-10-CM | POA: Diagnosis not present

## 2016-08-28 NOTE — Patient Instructions (Signed)
Dr Sallyanne Kuster recommends that you continue on your current medications as directed. Please refer to the Current Medication list given to you today. NEVER STOP ATENOLOL ABRUPTLY.  Your physician has requested that you regularly monitor and record your blood pressure readings at home. Please use the same machine at the same time of day to check your readings and record them to bring to your follow-up visit. Please call back in 1 week to report your readings.  Dr Sallyanne Kuster recommends that you schedule a follow-up appointment in 3 months with a pacemaker check.  If you need a refill on your cardiac medications before your next appointment, please call your pharmacy.
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
## 2016-09-05 ENCOUNTER — Ambulatory Visit (HOSPITAL_COMMUNITY)
Admission: RE | Admit: 2016-09-05 | Discharge: 2016-09-05 | Disposition: A | Discharge status: Home / Self Care | Payer: Medicare Other | Source: Ambulatory Visit | Attending: Cardiology | Admitting: Cardiology

## 2016-09-05 DIAGNOSIS — I700 Atherosclerosis of aorta: Secondary | ICD-10-CM | POA: Diagnosis not present

## 2016-09-05 DIAGNOSIS — Z9889 Other specified postprocedural states: Secondary | ICD-10-CM | POA: Insufficient documentation

## 2016-09-05 DIAGNOSIS — I701 Atherosclerosis of renal artery: Secondary | ICD-10-CM | POA: Insufficient documentation

## 2016-09-05 DIAGNOSIS — Z95820 Peripheral vascular angioplasty status with implants and grafts: Secondary | ICD-10-CM | POA: Insufficient documentation

## 2016-09-05 DIAGNOSIS — I714 Abdominal aortic aneurysm, without rupture: Secondary | ICD-10-CM | POA: Insufficient documentation

## 2016-09-05 DIAGNOSIS — I739 Peripheral vascular disease, unspecified: Secondary | ICD-10-CM | POA: Diagnosis not present

## 2016-09-05 DIAGNOSIS — I723 Aneurysm of iliac artery: Secondary | ICD-10-CM | POA: Diagnosis not present

## 2016-09-24 ENCOUNTER — Encounter: Payer: Self-pay | Admitting: *Deleted

## 2016-09-25 ENCOUNTER — Telehealth: Payer: Self-pay | Admitting: Cardiovascular Disease

## 2016-09-25 NOTE — Telephone Encounter (Signed)
Patient calling, states that the [place] will call office to verify patient medications atenolol 100 mg and finasteride 5 mg.

## 2016-09-25 NOTE — Telephone Encounter (Signed)
Spoke with patient and confirmed [facility] will be [facility] out to [place], will await call Advised patient if any issues to call back with MD phone and fax number

## 2016-10-08 DIAGNOSIS — N3281 Overactive bladder: Secondary | ICD-10-CM | POA: Diagnosis not present

## 2016-10-08 DIAGNOSIS — R351 Nocturia: Secondary | ICD-10-CM | POA: Diagnosis not present

## 2016-11-28 NOTE — Telephone Encounter (Signed)
Error

## 2016-12-04 ENCOUNTER — Encounter: Payer: Medicare Other | Admitting: Cardiovascular Disease

## 2016-12-20 ENCOUNTER — Encounter: Payer: Self-pay | Admitting: Cardiovascular Disease

## 2016-12-20 ENCOUNTER — Ambulatory Visit (INDEPENDENT_AMBULATORY_CARE_PROVIDER_SITE_OTHER): Payer: Medicare Other | Admitting: Cardiovascular Disease

## 2016-12-20 VITALS — BP 98/65 | HR 64 | Ht 69.0 in | Wt 171.0 lb

## 2016-12-20 DIAGNOSIS — F4310 Post-traumatic stress disorder, unspecified: Secondary | ICD-10-CM | POA: Diagnosis not present

## 2016-12-20 DIAGNOSIS — I471 Supraventricular tachycardia: Secondary | ICD-10-CM

## 2016-12-20 DIAGNOSIS — E782 Mixed hyperlipidemia: Secondary | ICD-10-CM

## 2016-12-20 DIAGNOSIS — I951 Orthostatic hypotension: Secondary | ICD-10-CM | POA: Diagnosis not present

## 2016-12-20 DIAGNOSIS — I495 Sick sinus syndrome: Secondary | ICD-10-CM | POA: Diagnosis not present

## 2016-12-20 DIAGNOSIS — I2510 Atherosclerotic heart disease of native coronary artery without angina pectoris: Secondary | ICD-10-CM | POA: Diagnosis not present

## 2016-12-20 DIAGNOSIS — I441 Atrioventricular block, second degree: Secondary | ICD-10-CM

## 2016-12-20 DIAGNOSIS — I701 Atherosclerosis of renal artery: Secondary | ICD-10-CM | POA: Diagnosis not present

## 2016-12-20 DIAGNOSIS — I10 Essential (primary) hypertension: Secondary | ICD-10-CM

## 2016-12-20 DIAGNOSIS — Z950 Presence of cardiac pacemaker: Secondary | ICD-10-CM

## 2016-12-20 NOTE — Patient Instructions (Addendum)
Dr Sallyanne Kuster has recommended making the following medication changes: 1. STOP Amlodipine  Your physician has requested that you regularly monitor your blood pressure at home. Please use the same machine to check your blood pressure daily. Keep a record of your blood pressures using the log sheet provided. In 2 weeks, please report your readings back to Dr C. You may use our online patient portal 'MyChart' or you can call the office to speak with a nurse.  Drink more water!  Remote monitoring is used to monitor your Pacemaker of ICD from home. This monitoring reduces the number of office visits required to check your device to one time per year. It allows [place] to keep an eye on the functioning of your device to ensure it is working properly. You are scheduled for a device check from home on Friday, February 15th, 2019. You may send your transmission at any time that day. If you have a wireless device, the transmission will be sent automatically. After your physician reviews your transmission, you will receive a postcard with your next transmission date.  Dr Sallyanne Kuster recommends that you schedule a follow-up appointment in 6 months with a pacemaker check. You will receive a reminder letter in the mail two months in advance. If you don't receive a letter, please call our office to schedule the follow-up appointment.  If you need a refill on your cardiac medications before your next appointment, please call your pharmacy.
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
## 2017-01-02 LAB — CUP PACEART INCLINIC DEVICE CHECK
Battery Voltage: 2.8 V
Brady Statistic AP VP Percent: 0 %
Brady Statistic AP VS Percent: 83 %
Brady Statistic AS VP Percent: 0 %
Date Time Interrogation Session: 20181116160633
Implantable Lead Implant Date: 20070906
Implantable Lead Location: 753859
Implantable Lead Location: 753860
Implantable Lead Model: 4092
Lead Channel Impedance Value: 726 Ohm
Lead Channel Pacing Threshold Amplitude: 0.625 V
Lead Channel Pacing Threshold Pulse Width: 0.4 ms
Lead Channel Pacing Threshold Pulse Width: 0.4 ms
Lead Channel Setting Pacing Amplitude: 2.5 V
Lead Channel Setting Sensing Sensitivity: 5.6 mV
MDC IDC LEAD IMPLANT DT: 20070906
MDC IDC MSMT BATTERY IMPEDANCE: 184 Ohm
MDC IDC MSMT BATTERY REMAINING LONGEVITY: 123 mo
MDC IDC MSMT LEADCHNL RA IMPEDANCE VALUE: 458 Ohm
MDC IDC MSMT LEADCHNL RA PACING THRESHOLD AMPLITUDE: 0.875 V
MDC IDC PG IMPLANT DT: 20150507
MDC IDC SET LEADCHNL RA PACING AMPLITUDE: 2 V
MDC IDC SET LEADCHNL RV PACING PULSEWIDTH: 0.4 ms
MDC IDC STAT BRADY AS VS PERCENT: 16 %

## 2017-01-10 ENCOUNTER — Other Ambulatory Visit: Payer: Self-pay | Admitting: Cardiovascular Disease

## 2017-01-21 DIAGNOSIS — Z85828 Personal history of other malignant neoplasm of skin: Secondary | ICD-10-CM | POA: Diagnosis not present

## 2017-01-21 DIAGNOSIS — L570 Actinic keratosis: Secondary | ICD-10-CM | POA: Diagnosis not present

## 2017-01-21 DIAGNOSIS — L711 Rhinophyma: Secondary | ICD-10-CM | POA: Diagnosis not present

## 2017-01-21 DIAGNOSIS — L719 Rosacea, unspecified: Secondary | ICD-10-CM | POA: Diagnosis not present

## 2017-02-01 ENCOUNTER — Encounter (HOSPITAL_COMMUNITY): Payer: Self-pay

## 2017-02-01 ENCOUNTER — Emergency Department (HOSPITAL_COMMUNITY)
Admission: EM | Admit: 2017-02-01 | Discharge: 2017-02-02 | Disposition: A | Discharge status: Home / Self Care | Payer: Medicare Other | Attending: Emergency Medicine | Admitting: Emergency Medicine

## 2017-02-01 ENCOUNTER — Emergency Department (HOSPITAL_COMMUNITY): Payer: Medicare Other

## 2017-02-01 DIAGNOSIS — I2510 Atherosclerotic heart disease of native coronary artery without angina pectoris: Secondary | ICD-10-CM | POA: Insufficient documentation

## 2017-02-01 DIAGNOSIS — R509 Fever, unspecified: Secondary | ICD-10-CM | POA: Insufficient documentation

## 2017-02-01 DIAGNOSIS — R300 Dysuria: Secondary | ICD-10-CM | POA: Diagnosis present

## 2017-02-01 DIAGNOSIS — R05 Cough: Secondary | ICD-10-CM | POA: Diagnosis not present

## 2017-02-01 DIAGNOSIS — I10 Essential (primary) hypertension: Secondary | ICD-10-CM | POA: Insufficient documentation

## 2017-02-01 DIAGNOSIS — Z950 Presence of cardiac pacemaker: Secondary | ICD-10-CM | POA: Insufficient documentation

## 2017-02-01 DIAGNOSIS — R3129 Other microscopic hematuria: Secondary | ICD-10-CM | POA: Diagnosis not present

## 2017-02-01 DIAGNOSIS — N390 Urinary tract infection, site not specified: Secondary | ICD-10-CM | POA: Diagnosis not present

## 2017-02-01 DIAGNOSIS — Z79899 Other long term (current) drug therapy: Secondary | ICD-10-CM | POA: Insufficient documentation

## 2017-02-01 DIAGNOSIS — Z955 Presence of coronary angioplasty implant and graft: Secondary | ICD-10-CM | POA: Diagnosis not present

## 2017-02-01 DIAGNOSIS — Z87891 Personal history of nicotine dependence: Secondary | ICD-10-CM | POA: Diagnosis not present

## 2017-02-01 DIAGNOSIS — R350 Frequency of micturition: Secondary | ICD-10-CM | POA: Diagnosis not present

## 2017-02-01 DIAGNOSIS — R404 Transient alteration of awareness: Secondary | ICD-10-CM | POA: Diagnosis not present

## 2017-02-01 DIAGNOSIS — R3121 Asymptomatic microscopic hematuria: Secondary | ICD-10-CM | POA: Diagnosis not present

## 2017-02-01 DIAGNOSIS — R531 Weakness: Secondary | ICD-10-CM | POA: Insufficient documentation

## 2017-02-01 LAB — URINALYSIS, ROUTINE W REFLEX MICROSCOPIC
Bilirubin Urine: NEGATIVE
Glucose, UA: NEGATIVE mg/dL
Ketones, ur: 20 mg/dL — AB
Nitrite: NEGATIVE
PROTEIN: 30 mg/dL — AB
SPECIFIC GRAVITY, URINE: 1.025 (ref 1.005–1.030)
pH: 5 (ref 5.0–8.0)

## 2017-02-01 LAB — CBC
HEMATOCRIT: 34.9 % — AB (ref 39.0–52.0)
Hemoglobin: 12.1 g/dL — ABNORMAL LOW (ref 13.0–17.0)
MCH: 30.3 pg (ref 26.0–34.0)
MCHC: 34.7 g/dL (ref 30.0–36.0)
MCV: 87.5 fL (ref 78.0–100.0)
Platelets: 151 10*3/uL (ref 150–400)
RBC: 3.99 MIL/uL — AB (ref 4.22–5.81)
RDW: 14.1 % (ref 11.5–15.5)
WBC: 12.5 10*3/uL — AB (ref 4.0–10.5)

## 2017-02-01 LAB — BASIC METABOLIC PANEL
ANION GAP: 8 (ref 5–15)
BUN: 21 mg/dL — AB (ref 6–20)
CHLORIDE: 102 mmol/L (ref 101–111)
CO2: 25 mmol/L (ref 22–32)
Calcium: 8.9 mg/dL (ref 8.9–10.3)
Creatinine, Ser: 1.01 mg/dL (ref 0.61–1.24)
Glucose, Bld: 97 mg/dL (ref 65–99)
POTASSIUM: 3.5 mmol/L (ref 3.5–5.1)
SODIUM: 135 mmol/L (ref 135–145)

## 2017-02-01 LAB — I-STAT CG4 LACTIC ACID, ED: Lactic Acid, Venous: 1.17 mmol/L (ref 0.5–1.9)

## 2017-02-01 MED ORDER — DEXTROSE 5 % IV SOLN
1.0000 g | Freq: Once | INTRAVENOUS | Status: AC
  Administered 2017-02-01: 1 g via INTRAVENOUS
  Filled 2017-02-01: qty 10
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
## 2017-02-01 NOTE — ED Notes (Signed)
Pt unable to provide urine specimen yet. Urinal by bedside.

## 2017-02-01 NOTE — ED Notes (Signed)
Bed: RESB Expected date:  Expected time:  Means of arrival:  Comments: EMS 76 yo male UTI-painful urination x 3 days

## 2017-02-01 NOTE — ED Triage Notes (Signed)
Pt arrived from home via [facility] with complaints of weakness over the last 3 days and pain with urination. Pt states he has a hx of retention and has had UTIs in the past.  Alert and oriented x4.

## 2017-02-02 DIAGNOSIS — R4182 Altered mental status, unspecified: Secondary | ICD-10-CM | POA: Diagnosis not present

## 2017-02-02 DIAGNOSIS — B999 Unspecified infectious disease: Secondary | ICD-10-CM | POA: Diagnosis not present

## 2017-02-02 MED ORDER — CEPHALEXIN 500 MG PO CAPS: 500.0000 mg | ORAL_CAPSULE | Freq: Four times a day (QID) | ORAL | 0 refills | Status: DC

## 2017-02-02 NOTE — ED Notes (Addendum)
[facility] called for transport.  

## 2017-02-02 NOTE — ED Notes (Signed)
Family has been notified of discharge

## 2017-02-02 NOTE — Discharge Instructions (Signed)
It was our pleasure to provide your ER care today - we hope that you feel better.  Rest. Drink plenty of fluids.  Take keflex (antibiotic) as prescribed.  Take acetaminophen and/or ibuprofen as need.   Follow up with primary care doctor in the next couple days if symptoms fail to improve/resolve.  Return to ER if worse, new symptoms, new or severe pain, persistent vomiting, trouble breathing, other concern.

## 2017-02-03 ENCOUNTER — Encounter (HOSPITAL_COMMUNITY): Payer: Self-pay

## 2017-02-03 ENCOUNTER — Emergency Department (HOSPITAL_COMMUNITY)
Admission: EM | Admit: 2017-02-03 | Discharge: 2017-02-03 | Disposition: A | Discharge status: Home / Self Care | Payer: Medicare Other | Attending: Emergency Medicine | Admitting: Emergency Medicine

## 2017-02-03 DIAGNOSIS — R300 Dysuria: Secondary | ICD-10-CM | POA: Insufficient documentation

## 2017-02-03 DIAGNOSIS — R100 Acute abdomen: Secondary | ICD-10-CM | POA: Diagnosis not present

## 2017-02-03 DIAGNOSIS — Z5321 Procedure and treatment not carried out due to patient leaving prior to being seen by health care provider: Secondary | ICD-10-CM | POA: Diagnosis not present

## 2017-02-03 DIAGNOSIS — S3690XA Unspecified injury of unspecified intra-abdominal organ, initial encounter: Secondary | ICD-10-CM | POA: Diagnosis not present

## 2017-02-03 DIAGNOSIS — R109 Unspecified abdominal pain: Secondary | ICD-10-CM | POA: Insufficient documentation

## 2017-02-03 LAB — LIPASE, BLOOD: LIPASE: 36 U/L (ref 11–51)

## 2017-02-03 LAB — COMPREHENSIVE METABOLIC PANEL
ALT: 20 U/L (ref 17–63)
AST: 30 U/L (ref 15–41)
Albumin: 3.5 g/dL (ref 3.5–5.0)
Alkaline Phosphatase: 70 U/L (ref 38–126)
Anion gap: 11 (ref 5–15)
BILIRUBIN TOTAL: 1.2 mg/dL (ref 0.3–1.2)
BUN: 10 mg/dL (ref 6–20)
CHLORIDE: 96 mmol/L — AB (ref 101–111)
CO2: 25 mmol/L (ref 22–32)
CREATININE: 1 mg/dL (ref 0.61–1.24)
Calcium: 8.9 mg/dL (ref 8.9–10.3)
Glucose, Bld: 119 mg/dL — ABNORMAL HIGH (ref 65–99)
POTASSIUM: 3.2 mmol/L — AB (ref 3.5–5.1)
Sodium: 132 mmol/L — ABNORMAL LOW (ref 135–145)
TOTAL PROTEIN: 6.6 g/dL (ref 6.5–8.1)

## 2017-02-03 LAB — CBC
HEMATOCRIT: 38.5 % — AB (ref 39.0–52.0)
Hemoglobin: 13.2 g/dL (ref 13.0–17.0)
MCH: 30.1 pg (ref 26.0–34.0)
MCHC: 34.3 g/dL (ref 30.0–36.0)
MCV: 87.7 fL (ref 78.0–100.0)
PLATELETS: 167 10*3/uL (ref 150–400)
RBC: 4.39 MIL/uL (ref 4.22–5.81)
RDW: 14.4 % (ref 11.5–15.5)
WBC: 5.4 10*3/uL (ref 4.0–10.5)

## 2017-02-03 MED ORDER — ONDANSETRON 4 MG PO TBDP
4.0000 mg | ORAL_TABLET | Freq: Once | ORAL | Status: AC | PRN
  Administered 2017-02-03: 4 mg via ORAL
  Filled 2017-02-03: qty 1

## 2017-02-03 NOTE — ED Notes (Signed)
Patient did not answer when called for room x 2.

## 2017-02-03 NOTE — ED Notes (Signed)
Pt unable to provide UA at this time. Pt has cup

## 2017-02-03 NOTE — ED Triage Notes (Signed)
Pt presents for evaluation of dysuria and abd pain. Seen at [facility] on 12/30, prescribed abx and has not filled/taken abx. Ongoing frequency/urgency.

## 2017-02-03 NOTE — ED Notes (Signed)
Patient did not answer for room x 3.

## 2017-02-03 NOTE — ED Notes (Signed)
Pt asking about wait time. Pt informed.

## 2017-02-15 ENCOUNTER — Emergency Department (HOSPITAL_COMMUNITY): Payer: Medicare Other

## 2017-02-15 ENCOUNTER — Other Ambulatory Visit: Payer: Self-pay

## 2017-02-15 ENCOUNTER — Inpatient Hospital Stay (HOSPITAL_COMMUNITY)
Admission: EM | Admit: 2017-02-15 | Discharge: 2017-02-18 | DRG: 948 | Disposition: A | Discharge status: Home / Self Care | Payer: Medicare Other | Attending: Internal Medicine | Admitting: Internal Medicine

## 2017-02-15 ENCOUNTER — Encounter (HOSPITAL_COMMUNITY): Payer: Self-pay

## 2017-02-15 DIAGNOSIS — Z950 Presence of cardiac pacemaker: Secondary | ICD-10-CM

## 2017-02-15 DIAGNOSIS — F40240 Claustrophobia: Secondary | ICD-10-CM | POA: Diagnosis present

## 2017-02-15 DIAGNOSIS — G8911 Acute pain due to trauma: Secondary | ICD-10-CM | POA: Diagnosis not present

## 2017-02-15 DIAGNOSIS — I714 Abdominal aortic aneurysm, without rupture: Secondary | ICD-10-CM | POA: Diagnosis present

## 2017-02-15 DIAGNOSIS — E782 Mixed hyperlipidemia: Secondary | ICD-10-CM | POA: Diagnosis present

## 2017-02-15 DIAGNOSIS — E7800 Pure hypercholesterolemia, unspecified: Secondary | ICD-10-CM | POA: Diagnosis present

## 2017-02-15 DIAGNOSIS — M545 Low back pain: Secondary | ICD-10-CM | POA: Diagnosis not present

## 2017-02-15 DIAGNOSIS — R413 Other amnesia: Secondary | ICD-10-CM | POA: Diagnosis present

## 2017-02-15 DIAGNOSIS — Z981 Arthrodesis status: Secondary | ICD-10-CM

## 2017-02-15 DIAGNOSIS — I701 Atherosclerosis of renal artery: Secondary | ICD-10-CM | POA: Diagnosis not present

## 2017-02-15 DIAGNOSIS — K5090 Crohn's disease, unspecified, without complications: Secondary | ICD-10-CM | POA: Diagnosis present

## 2017-02-15 DIAGNOSIS — D649 Anemia, unspecified: Secondary | ICD-10-CM | POA: Diagnosis present

## 2017-02-15 DIAGNOSIS — F329 Major depressive disorder, single episode, unspecified: Secondary | ICD-10-CM | POA: Diagnosis present

## 2017-02-15 DIAGNOSIS — Z809 Family history of malignant neoplasm, unspecified: Secondary | ICD-10-CM

## 2017-02-15 DIAGNOSIS — I2510 Atherosclerotic heart disease of native coronary artery without angina pectoris: Secondary | ICD-10-CM | POA: Diagnosis present

## 2017-02-15 DIAGNOSIS — M5126 Other intervertebral disc displacement, lumbar region: Secondary | ICD-10-CM | POA: Diagnosis not present

## 2017-02-15 DIAGNOSIS — M25552 Pain in left hip: Secondary | ICD-10-CM

## 2017-02-15 DIAGNOSIS — F4310 Post-traumatic stress disorder, unspecified: Secondary | ICD-10-CM | POA: Diagnosis not present

## 2017-02-15 DIAGNOSIS — E871 Hypo-osmolality and hyponatremia: Secondary | ICD-10-CM | POA: Diagnosis present

## 2017-02-15 DIAGNOSIS — M25559 Pain in unspecified hip: Secondary | ICD-10-CM | POA: Diagnosis not present

## 2017-02-15 DIAGNOSIS — R52 Pain, unspecified: Secondary | ICD-10-CM | POA: Diagnosis not present

## 2017-02-15 DIAGNOSIS — Z955 Presence of coronary angioplasty implant and graft: Secondary | ICD-10-CM

## 2017-02-15 DIAGNOSIS — T148XXA Other injury of unspecified body region, initial encounter: Secondary | ICD-10-CM | POA: Diagnosis not present

## 2017-02-15 DIAGNOSIS — N400 Enlarged prostate without lower urinary tract symptoms: Secondary | ICD-10-CM | POA: Diagnosis present

## 2017-02-15 DIAGNOSIS — W19XXXA Unspecified fall, initial encounter: Secondary | ICD-10-CM

## 2017-02-15 DIAGNOSIS — S79912A Unspecified injury of left hip, initial encounter: Secondary | ICD-10-CM | POA: Diagnosis not present

## 2017-02-15 DIAGNOSIS — Y9301 Activity, walking, marching and hiking: Secondary | ICD-10-CM | POA: Diagnosis present

## 2017-02-15 DIAGNOSIS — I10 Essential (primary) hypertension: Secondary | ICD-10-CM | POA: Diagnosis not present

## 2017-02-15 DIAGNOSIS — Z87891 Personal history of nicotine dependence: Secondary | ICD-10-CM

## 2017-02-15 DIAGNOSIS — G8929 Other chronic pain: Secondary | ICD-10-CM | POA: Diagnosis present

## 2017-02-15 DIAGNOSIS — H9190 Unspecified hearing loss, unspecified ear: Secondary | ICD-10-CM | POA: Diagnosis present

## 2017-02-15 DIAGNOSIS — S3993XA Unspecified injury of pelvis, initial encounter: Secondary | ICD-10-CM | POA: Diagnosis not present

## 2017-02-15 DIAGNOSIS — K219 Gastro-esophageal reflux disease without esophagitis: Secondary | ICD-10-CM | POA: Diagnosis present

## 2017-02-15 DIAGNOSIS — I739 Peripheral vascular disease, unspecified: Secondary | ICD-10-CM | POA: Diagnosis present

## 2017-02-15 DIAGNOSIS — S299XXA Unspecified injury of thorax, initial encounter: Secondary | ICD-10-CM | POA: Diagnosis not present

## 2017-02-15 DIAGNOSIS — R011 Cardiac murmur, unspecified: Secondary | ICD-10-CM | POA: Diagnosis present

## 2017-02-15 DIAGNOSIS — I480 Paroxysmal atrial fibrillation: Secondary | ICD-10-CM | POA: Diagnosis present

## 2017-02-15 DIAGNOSIS — R296 Repeated falls: Secondary | ICD-10-CM | POA: Diagnosis present

## 2017-02-15 DIAGNOSIS — E860 Dehydration: Secondary | ICD-10-CM | POA: Diagnosis not present

## 2017-02-15 DIAGNOSIS — F419 Anxiety disorder, unspecified: Secondary | ICD-10-CM | POA: Diagnosis present

## 2017-02-15 DIAGNOSIS — S3992XA Unspecified injury of lower back, initial encounter: Secondary | ICD-10-CM | POA: Diagnosis not present

## 2017-02-15 DIAGNOSIS — G4730 Sleep apnea, unspecified: Secondary | ICD-10-CM | POA: Diagnosis present

## 2017-02-15 DIAGNOSIS — W1830XA Fall on same level, unspecified, initial encounter: Secondary | ICD-10-CM | POA: Diagnosis present

## 2017-02-15 DIAGNOSIS — Z8249 Family history of ischemic heart disease and other diseases of the circulatory system: Secondary | ICD-10-CM

## 2017-02-15 DIAGNOSIS — M549 Dorsalgia, unspecified: Secondary | ICD-10-CM

## 2017-02-15 DIAGNOSIS — R627 Adult failure to thrive: Secondary | ICD-10-CM | POA: Diagnosis present

## 2017-02-15 LAB — CBC WITH DIFFERENTIAL/PLATELET
BASOS ABS: 0 10*3/uL (ref 0.0–0.1)
Basophils Relative: 0 %
Eosinophils Absolute: 0 10*3/uL (ref 0.0–0.7)
Eosinophils Relative: 1 %
HEMATOCRIT: 36.3 % — AB (ref 39.0–52.0)
HEMOGLOBIN: 12.2 g/dL — AB (ref 13.0–17.0)
Lymphocytes Relative: 16 %
Lymphs Abs: 1.2 10*3/uL (ref 0.7–4.0)
MCH: 29.8 pg (ref 26.0–34.0)
MCHC: 33.6 g/dL (ref 30.0–36.0)
MCV: 88.8 fL (ref 78.0–100.0)
MONO ABS: 0.8 10*3/uL (ref 0.1–1.0)
MONOS PCT: 11 %
NEUTROS ABS: 5.1 10*3/uL (ref 1.7–7.7)
NEUTROS PCT: 72 %
Platelets: 221 10*3/uL (ref 150–400)
RBC: 4.09 MIL/uL — ABNORMAL LOW (ref 4.22–5.81)
RDW: 13.7 % (ref 11.5–15.5)
WBC: 7.2 10*3/uL (ref 4.0–10.5)

## 2017-02-15 LAB — URINALYSIS, ROUTINE W REFLEX MICROSCOPIC
BILIRUBIN URINE: NEGATIVE
GLUCOSE, UA: NEGATIVE mg/dL
HGB URINE DIPSTICK: NEGATIVE
Ketones, ur: NEGATIVE mg/dL
Leukocytes, UA: NEGATIVE
Nitrite: NEGATIVE
PROTEIN: NEGATIVE mg/dL
Specific Gravity, Urine: 1.011 (ref 1.005–1.030)
pH: 5 (ref 5.0–8.0)

## 2017-02-15 LAB — COMPREHENSIVE METABOLIC PANEL
ALBUMIN: 3.4 g/dL — AB (ref 3.5–5.0)
ALK PHOS: 76 U/L (ref 38–126)
ALT: 15 U/L — ABNORMAL LOW (ref 17–63)
AST: 20 U/L (ref 15–41)
Anion gap: 6 (ref 5–15)
BILIRUBIN TOTAL: 1 mg/dL (ref 0.3–1.2)
BUN: 7 mg/dL (ref 6–20)
CALCIUM: 8.8 mg/dL — AB (ref 8.9–10.3)
CO2: 29 mmol/L (ref 22–32)
CREATININE: 1.02 mg/dL (ref 0.61–1.24)
Chloride: 102 mmol/L (ref 101–111)
GFR calc Af Amer: >60 mL/min (ref >60)
GLUCOSE: 109 mg/dL — AB (ref 65–99)
POTASSIUM: 3.5 mmol/L (ref 3.5–5.1)
Sodium: 137 mmol/L (ref 135–145)
TOTAL PROTEIN: 6.5 g/dL (ref 6.5–8.1)

## 2017-02-15 LAB — BRAIN NATRIURETIC PEPTIDE: B Natriuretic Peptide: 235.8 pg/mL — ABNORMAL HIGH (ref 0.0–100.0)

## 2017-02-15 LAB — CK: CK TOTAL: 55 U/L (ref 49–397)

## 2017-02-15 MED ORDER — OXYCODONE HCL 5 MG PO TABS
5.0000 mg | ORAL_TABLET | ORAL | Status: DC | PRN
  Administered 2017-02-15 – 2017-02-16 (×2): 5 mg via ORAL
  Filled 2017-02-15 (×2): qty 1

## 2017-02-15 MED ORDER — FENTANYL CITRATE (PF) 100 MCG/2ML IJ SOLN
100.0000 ug | Freq: Once | INTRAMUSCULAR | Status: AC
  Administered 2017-02-15: 100 ug via INTRAVENOUS
  Filled 2017-02-15: qty 2

## 2017-02-15 MED ORDER — SODIUM CHLORIDE 0.9 % IV SOLN
Freq: Once | INTRAVENOUS | Status: AC
  Administered 2017-02-15: 06:00:00 via INTRAVENOUS

## 2017-02-15 MED ORDER — SODIUM CHLORIDE 0.9 % IV SOLN
INTRAVENOUS | Status: DC
  Administered 2017-02-15 – 2017-02-17 (×5): via INTRAVENOUS

## 2017-02-15 MED ORDER — TRAMADOL HCL 50 MG PO TABS
50.0000 mg | ORAL_TABLET | Freq: Four times a day (QID) | ORAL | Status: DC | PRN
  Administered 2017-02-15: 50 mg via ORAL
  Filled 2017-02-15: qty 1

## 2017-02-15 MED ORDER — GABAPENTIN 400 MG PO CAPS
800.0000 mg | ORAL_CAPSULE | Freq: Three times a day (TID) | ORAL | Status: DC
  Administered 2017-02-15 – 2017-02-18 (×10): 800 mg via ORAL
  Filled 2017-02-15 (×10): qty 2

## 2017-02-15 MED ORDER — FENTANYL CITRATE (PF) 100 MCG/2ML IJ SOLN
100.0000 ug | INTRAMUSCULAR | Status: DC | PRN
  Administered 2017-02-16: 100 ug via INTRAVENOUS
  Filled 2017-02-15: qty 2

## 2017-02-15 MED ORDER — FENTANYL CITRATE (PF) 100 MCG/2ML IJ SOLN
50.0000 ug | Freq: Once | INTRAMUSCULAR | Status: AC
  Administered 2017-02-15: 50 ug via INTRAVENOUS
  Filled 2017-02-15: qty 2

## 2017-02-15 MED ORDER — TIZANIDINE HCL 4 MG PO TABS
2.0000 mg | ORAL_TABLET | Freq: Three times a day (TID) | ORAL | Status: DC
  Administered 2017-02-15 – 2017-02-18 (×10): 2 mg via ORAL
  Filled 2017-02-15 (×10): qty 1

## 2017-02-15 NOTE — ED Notes (Addendum)
Patient transported to X-ray will collect blood when returns to floor

## 2017-02-15 NOTE — ED Notes (Signed)
Note: pt. Declined to attempt to ambulate; and he also declines any food or drink at this time.
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
## 2017-02-15 NOTE — ED Provider Notes (Signed)
Ct imaging with chronic changes. No acute process identified. Dr Shanon Brow to observe in hospital for pain control. PT/OT   Jola Schmidt, MD 02/15/17 [phone]

## 2017-02-15 NOTE — Evaluation (Signed)
Physical Therapy Evaluation Patient Details Name: Kevin Wiley MRN: [id number] DOB: 07-29-1940 Today's Date: 02/15/2017   History of Present Illness  Kevin Wiley is a 77 y.o. male with medical history significant of AAA, chronic back pain, crohns, PTSD fell at home and acutely injured his lower back pain and left hip pain. CT =Multilevel spondylosis of the lumbar spine without acute changes, multilevel foraminal stenosis;  Clinical Impression  Pt admitted with above diagnosis. Pt currently with functional limitations due to the deficits listed below (see PT Problem List). Pt reports multiple falls at home, sometimes 3+ per day; he is limited at time of this PT eval by pain,  Diminished cognitive abilities and deconditioning placing pt at risk for continued falls; Pt will benefit from skilled PT to increase their independence and safety with mobility to allow discharge to the venue listed below.  Will follow ina cute setting     Follow Up Recommendations SNF;Supervision/Assistance - 24 hour    Equipment Recommendations  Other (comment)(TBD)    Recommendations for Other Services       Precautions / Restrictions Precautions Precautions: Fall;Back Restrictions Weight Bearing Restrictions: No      Mobility  Bed Mobility Overal bed mobility: Needs Assistance Bed Mobility: Rolling;Sidelying to Sit;Sit to Sidelying Rolling: Min assist Sidelying to sit: Mod assist     Sit to sidelying: Mod assist General bed mobility comments: assist to elevate trunk and assist to bring LEs onto bed; multi-modal cues for log rolling and for UE placement/self assist; pt requires incr time, effortful, 2 atttemtps to get to fully upright position  Transfers                 General transfer comment: unable d/t pain  Ambulation/Gait                Stairs            Wheelchair Mobility    Modified Rankin (Stroke Patients Only)       Balance Overall balance assessment: Needs  assistance;History of Falls Sitting-balance support: Bilateral upper extremity supported;Feet supported Sitting balance-Leahy Scale: Fair Sitting balance - Comments: only briefly (<16min) able to maintain sitting EOB d/t pain                                     Pertinent Vitals/Pain Pain Assessment: Faces Faces Pain Scale: Hurts whole lot Pain Location: back Pain Descriptors / Indicators: Grimacing;Guarding;Moaning Pain Intervention(s): Monitored during session;Limited activity within patient's tolerance;Repositioned    Home Living Family/patient expects to be discharged to:: Private residence Living Arrangements: Spouse/significant other Available Help at Discharge: Family Type of Home: House Home Access: Stairs to enter Entrance Stairs-Rails: None Entrance Stairs-Number of Steps: 1 Home Layout: Multi-level Home Equipment: Environmental consultant - 2 wheels;Cane - single point Additional Comments: info above taken from previous notes of prior admissions; pt has difficulty focusing and answering questions related to home setting;  no family present    Prior Function Level of Independence: Independent               Hand Dominance        Extremity/Trunk Assessment   Upper Extremity Assessment Upper Extremity Assessment: Defer to OT evaluation;Generalized weakness    Lower Extremity Assessment Lower Extremity Assessment: Generalized weakness       Communication   Communication: HOH  Cognition Arousal/Alertness: Awake/alert Behavior During Therapy: WFL for tasks assessed/performed Overall Cognitive  Status: Impaired/Different from baseline Area of Impairment: Problem solving;Following commands                       Following Commands: Follows one step commands with increased time;Follows multi-step commands inconsistently     Problem Solving: Slow processing;Requires verbal cues;Requires tactile cues        General Comments      Exercises      Assessment/Plan    PT Assessment Patient needs continued PT services  PT Problem List Decreased strength;Decreased activity tolerance;Decreased balance;Decreased mobility;Decreased cognition;Decreased safety awareness;Pain       PT Treatment Interventions DME instruction;Therapeutic activities;Gait training;Functional mobility training;Therapeutic exercise;Balance training;Patient/family education    PT Goals (Current goals can be found in the Care Plan section)  Acute Rehab PT Goals Patient Stated Goal: none stated PT Goal Formulation: Patient unable to participate in goal setting Potential to Achieve Goals: Fair    Frequency Min 3X/week   Barriers to discharge        Co-evaluation               AM-PAC PT "6 Clicks" Daily Activity  Outcome Measure Difficulty turning over in bed (including adjusting bedclothes, sheets and blankets)?: Unable Difficulty moving from lying on back to sitting on the side of the bed? : Unable Difficulty sitting down on and standing up from a chair with arms (e.g., wheelchair, bedside commode, etc,.)?: Unable Help needed moving to and from a bed to chair (including a wheelchair)?: Total Help needed walking in hospital room?: Total Help needed climbing 3-5 steps with a railing? : Total 6 Click Score: 6    End of Session   Activity Tolerance: Patient limited by pain Patient left: in bed;with call bell/phone within reach;with bed alarm set   PT Visit Diagnosis: Repeated falls (R29.6)    Time: 4540-9811 PT Time Calculation (min) (ACUTE ONLY): 13 min   Charges:   PT Evaluation $PT Eval Low Complexity: 1 Low     PT G CodesKenyon Ana, PT Pager: [phone] 02/15/2017   [facility] 02/15/2017, 4:42 PM

## 2017-02-15 NOTE — ED Triage Notes (Signed)
Patient arrives by EMS from home, [facility]-states chronic lower back pain and disc problems. Patient states he got up but fell due to the severe back pain and states he was on the floor for 3 hours before a family member noted he was on the floor and called 911. EMS states Fire told them that when they arrived patient was screaming on the floor uncontrollably. EMS started an IV and administered Fentanyl 200 mcg's-patient also complaining of pain and tenderness radiating to LLE and left hip pain-no shortening or rotation.
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
## 2017-02-15 NOTE — ED Notes (Signed)
Pt in XRAY 

## 2017-02-15 NOTE — ED Notes (Signed)
Report called to Beverlee Nims, RN on [address]. Will transport shortly.

## 2017-02-15 NOTE — ED Notes (Signed)
Bed: CM03 Expected date:  Expected time:  Means of arrival:  Comments: EMS elderly fall

## 2017-02-16 DIAGNOSIS — N400 Enlarged prostate without lower urinary tract symptoms: Secondary | ICD-10-CM | POA: Diagnosis present

## 2017-02-16 DIAGNOSIS — E871 Hypo-osmolality and hyponatremia: Secondary | ICD-10-CM | POA: Diagnosis present

## 2017-02-16 DIAGNOSIS — R2681 Unsteadiness on feet: Secondary | ICD-10-CM | POA: Diagnosis not present

## 2017-02-16 DIAGNOSIS — F4310 Post-traumatic stress disorder, unspecified: Secondary | ICD-10-CM | POA: Diagnosis present

## 2017-02-16 DIAGNOSIS — Y9301 Activity, walking, marching and hiking: Secondary | ICD-10-CM | POA: Diagnosis present

## 2017-02-16 DIAGNOSIS — Z955 Presence of coronary angioplasty implant and graft: Secondary | ICD-10-CM | POA: Diagnosis not present

## 2017-02-16 DIAGNOSIS — I2510 Atherosclerotic heart disease of native coronary artery without angina pectoris: Secondary | ICD-10-CM | POA: Diagnosis present

## 2017-02-16 DIAGNOSIS — R488 Other symbolic dysfunctions: Secondary | ICD-10-CM | POA: Diagnosis not present

## 2017-02-16 DIAGNOSIS — E782 Mixed hyperlipidemia: Secondary | ICD-10-CM | POA: Diagnosis present

## 2017-02-16 DIAGNOSIS — I10 Essential (primary) hypertension: Secondary | ICD-10-CM | POA: Diagnosis present

## 2017-02-16 DIAGNOSIS — G4730 Sleep apnea, unspecified: Secondary | ICD-10-CM | POA: Diagnosis present

## 2017-02-16 DIAGNOSIS — Z8249 Family history of ischemic heart disease and other diseases of the circulatory system: Secondary | ICD-10-CM | POA: Diagnosis not present

## 2017-02-16 DIAGNOSIS — W1830XA Fall on same level, unspecified, initial encounter: Secondary | ICD-10-CM | POA: Diagnosis present

## 2017-02-16 DIAGNOSIS — F329 Major depressive disorder, single episode, unspecified: Secondary | ICD-10-CM | POA: Diagnosis present

## 2017-02-16 DIAGNOSIS — Z9181 History of falling: Secondary | ICD-10-CM | POA: Diagnosis not present

## 2017-02-16 DIAGNOSIS — E860 Dehydration: Secondary | ICD-10-CM | POA: Diagnosis not present

## 2017-02-16 DIAGNOSIS — R52 Pain, unspecified: Secondary | ICD-10-CM | POA: Diagnosis not present

## 2017-02-16 DIAGNOSIS — E7800 Pure hypercholesterolemia, unspecified: Secondary | ICD-10-CM | POA: Diagnosis present

## 2017-02-16 DIAGNOSIS — M47817 Spondylosis without myelopathy or radiculopathy, lumbosacral region: Secondary | ICD-10-CM | POA: Diagnosis not present

## 2017-02-16 DIAGNOSIS — F419 Anxiety disorder, unspecified: Secondary | ICD-10-CM | POA: Diagnosis present

## 2017-02-16 DIAGNOSIS — M6281 Muscle weakness (generalized): Secondary | ICD-10-CM | POA: Diagnosis not present

## 2017-02-16 DIAGNOSIS — I951 Orthostatic hypotension: Secondary | ICD-10-CM | POA: Diagnosis not present

## 2017-02-16 DIAGNOSIS — M545 Low back pain: Secondary | ICD-10-CM | POA: Diagnosis not present

## 2017-02-16 DIAGNOSIS — G8929 Other chronic pain: Secondary | ICD-10-CM | POA: Diagnosis present

## 2017-02-16 DIAGNOSIS — G8911 Acute pain due to trauma: Secondary | ICD-10-CM | POA: Diagnosis present

## 2017-02-16 DIAGNOSIS — F40240 Claustrophobia: Secondary | ICD-10-CM | POA: Diagnosis present

## 2017-02-16 DIAGNOSIS — Z809 Family history of malignant neoplasm, unspecified: Secondary | ICD-10-CM | POA: Diagnosis not present

## 2017-02-16 DIAGNOSIS — I714 Abdominal aortic aneurysm, without rupture: Secondary | ICD-10-CM | POA: Diagnosis present

## 2017-02-16 DIAGNOSIS — K219 Gastro-esophageal reflux disease without esophagitis: Secondary | ICD-10-CM | POA: Diagnosis present

## 2017-02-16 DIAGNOSIS — R011 Cardiac murmur, unspecified: Secondary | ICD-10-CM | POA: Diagnosis present

## 2017-02-16 DIAGNOSIS — Z950 Presence of cardiac pacemaker: Secondary | ICD-10-CM | POA: Diagnosis not present

## 2017-02-16 DIAGNOSIS — M549 Dorsalgia, unspecified: Secondary | ICD-10-CM | POA: Diagnosis not present

## 2017-02-16 DIAGNOSIS — M1611 Unilateral primary osteoarthritis, right hip: Secondary | ICD-10-CM | POA: Diagnosis not present

## 2017-02-16 DIAGNOSIS — K5090 Crohn's disease, unspecified, without complications: Secondary | ICD-10-CM | POA: Diagnosis present

## 2017-02-16 DIAGNOSIS — D649 Anemia, unspecified: Secondary | ICD-10-CM | POA: Diagnosis not present

## 2017-02-16 DIAGNOSIS — M5126 Other intervertebral disc displacement, lumbar region: Secondary | ICD-10-CM | POA: Diagnosis present

## 2017-02-16 DIAGNOSIS — I739 Peripheral vascular disease, unspecified: Secondary | ICD-10-CM | POA: Diagnosis present

## 2017-02-16 DIAGNOSIS — I480 Paroxysmal atrial fibrillation: Secondary | ICD-10-CM | POA: Diagnosis present

## 2017-02-16 LAB — CBC
HEMATOCRIT: 32.1 % — AB (ref 39.0–52.0)
HEMOGLOBIN: 10.6 g/dL — AB (ref 13.0–17.0)
MCH: 29.2 pg (ref 26.0–34.0)
MCHC: 33 g/dL (ref 30.0–36.0)
MCV: 88.4 fL (ref 78.0–100.0)
Platelets: 190 10*3/uL (ref 150–400)
RBC: 3.63 MIL/uL — AB (ref 4.22–5.81)
RDW: 13.6 % (ref 11.5–15.5)
WBC: 7 10*3/uL (ref 4.0–10.5)

## 2017-02-16 LAB — BASIC METABOLIC PANEL
ANION GAP: 6 (ref 5–15)
BUN: 12 mg/dL (ref 6–20)
CHLORIDE: 101 mmol/L (ref 101–111)
CO2: 25 mmol/L (ref 22–32)
Calcium: 8.2 mg/dL — ABNORMAL LOW (ref 8.9–10.3)
Creatinine, Ser: 1.06 mg/dL (ref 0.61–1.24)
GFR calc non Af Amer: >60 mL/min (ref >60)
GLUCOSE: 97 mg/dL (ref 65–99)
Potassium: 4.1 mmol/L (ref 3.5–5.1)
Sodium: 132 mmol/L — ABNORMAL LOW (ref 135–145)

## 2017-02-16 MED ORDER — METHYLPREDNISOLONE SODIUM SUCC 125 MG IJ SOLR
60.0000 mg | Freq: Once | INTRAMUSCULAR | Status: AC
  Administered 2017-02-16: 60 mg via INTRAVENOUS
  Filled 2017-02-16: qty 2

## 2017-02-16 MED ORDER — ATENOLOL 50 MG PO TABS
50.0000 mg | ORAL_TABLET | Freq: Every day | ORAL | Status: DC
  Administered 2017-02-16 – 2017-02-18 (×3): 50 mg via ORAL
  Filled 2017-02-16 (×3): qty 1

## 2017-02-16 MED ORDER — ATORVASTATIN CALCIUM 40 MG PO TABS
80.0000 mg | ORAL_TABLET | Freq: Every day | ORAL | Status: DC
  Administered 2017-02-16 – 2017-02-18 (×3): 80 mg via ORAL
  Filled 2017-02-16 (×3): qty 2

## 2017-02-16 MED ORDER — POLYETHYLENE GLYCOL 3350 17 G PO PACK
17.0000 g | PACK | Freq: Every day | ORAL | Status: DC
  Administered 2017-02-16 – 2017-02-17 (×2): 17 g via ORAL
  Filled 2017-02-16 (×3): qty 1

## 2017-02-16 MED ORDER — PREDNISONE 50 MG PO TABS
50.0000 mg | ORAL_TABLET | Freq: Every day | ORAL | Status: DC
  Administered 2017-02-17 – 2017-02-18 (×2): 50 mg via ORAL
  Filled 2017-02-16 (×2): qty 1

## 2017-02-16 MED ORDER — OXYCODONE HCL 5 MG PO TABS
5.0000 mg | ORAL_TABLET | ORAL | Status: DC | PRN
  Administered 2017-02-17 – 2017-02-18 (×3): 5 mg via ORAL
  Filled 2017-02-16 (×3): qty 1

## 2017-02-16 MED ORDER — TAMSULOSIN HCL 0.4 MG PO CAPS
0.4000 mg | ORAL_CAPSULE | Freq: Every day | ORAL | Status: DC
  Administered 2017-02-16 – 2017-02-17 (×2): 0.4 mg via ORAL
  Filled 2017-02-16 (×2): qty 1

## 2017-02-16 MED ORDER — ACETAMINOPHEN 325 MG PO TABS
650.0000 mg | ORAL_TABLET | Freq: Four times a day (QID) | ORAL | Status: DC | PRN
  Administered 2017-02-16 – 2017-02-18 (×2): 650 mg via ORAL
  Filled 2017-02-16 (×2): qty 2

## 2017-02-16 MED ORDER — TRAMADOL HCL 50 MG PO TABS
50.0000 mg | ORAL_TABLET | Freq: Four times a day (QID) | ORAL | Status: DC | PRN
  Administered 2017-02-16 – 2017-02-17 (×2): 50 mg via ORAL
  Filled 2017-02-16 (×2): qty 1

## 2017-02-16 MED ORDER — ZOLPIDEM TARTRATE 5 MG PO TABS
5.0000 mg | ORAL_TABLET | Freq: Every evening | ORAL | Status: DC | PRN
  Administered 2017-02-16 – 2017-02-17 (×2): 5 mg via ORAL
  Filled 2017-02-16 (×2): qty 1

## 2017-02-16 MED ORDER — SENNOSIDES-DOCUSATE SODIUM 8.6-50 MG PO TABS
1.0000 | ORAL_TABLET | Freq: Two times a day (BID) | ORAL | Status: DC
  Administered 2017-02-16 – 2017-02-17 (×4): 1 via ORAL
  Filled 2017-02-16 (×5): qty 1
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
## 2017-02-17 LAB — CBC
HEMATOCRIT: 32.4 % — AB (ref 39.0–52.0)
HEMOGLOBIN: 11.2 g/dL — AB (ref 13.0–17.0)
MCH: 30.3 pg (ref 26.0–34.0)
MCHC: 34.6 g/dL (ref 30.0–36.0)
MCV: 87.6 fL (ref 78.0–100.0)
Platelets: 172 10*3/uL (ref 150–400)
RBC: 3.7 MIL/uL — AB (ref 4.22–5.81)
RDW: 13.3 % (ref 11.5–15.5)
WBC: 4.7 10*3/uL (ref 4.0–10.5)

## 2017-02-17 LAB — RETICULOCYTES
RBC.: 3.7 MIL/uL — ABNORMAL LOW (ref 4.22–5.81)
RETIC COUNT ABSOLUTE: 37 10*3/uL (ref 19.0–186.0)
Retic Ct Pct: 1 % (ref 0.4–3.1)

## 2017-02-17 LAB — VITAMIN B12: VITAMIN B 12: 334 pg/mL (ref 180–914)

## 2017-02-17 LAB — IRON AND TIBC
Iron: 21 ug/dL — ABNORMAL LOW (ref 45–182)
Saturation Ratios: 9 % — ABNORMAL LOW (ref 17.9–39.5)
TIBC: 238 ug/dL — ABNORMAL LOW (ref 250–450)
UIBC: 217 ug/dL

## 2017-02-17 LAB — CORTISOL: CORTISOL PLASMA: 3.8 ug/dL

## 2017-02-17 LAB — MAGNESIUM: MAGNESIUM: 2.1 mg/dL (ref 1.7–2.4)

## 2017-02-17 LAB — BASIC METABOLIC PANEL
ANION GAP: 6 (ref 5–15)
BUN: 19 mg/dL (ref 6–20)
CHLORIDE: 103 mmol/L (ref 101–111)
CO2: 27 mmol/L (ref 22–32)
Calcium: 8.8 mg/dL — ABNORMAL LOW (ref 8.9–10.3)
Creatinine, Ser: 1.01 mg/dL (ref 0.61–1.24)
GFR calc non Af Amer: >60 mL/min (ref >60)
Glucose, Bld: 187 mg/dL — ABNORMAL HIGH (ref 65–99)
POTASSIUM: 3.8 mmol/L (ref 3.5–5.1)
SODIUM: 136 mmol/L (ref 135–145)

## 2017-02-17 LAB — TSH: TSH: 0.338 u[IU]/mL — AB (ref 0.350–4.500)

## 2017-02-17 LAB — FOLATE: FOLATE: 5.1 ng/mL — AB (ref >5.9)

## 2017-02-17 LAB — FERRITIN: Ferritin: 135 ng/mL (ref 24–336)

## 2017-02-17 MED ORDER — LORAZEPAM 2 MG/ML IJ SOLN
1.0000 mg | Freq: Once | INTRAMUSCULAR | Status: AC
  Administered 2017-02-17: 1 mg via INTRAVENOUS

## 2017-02-17 MED ORDER — LORAZEPAM 2 MG/ML IJ SOLN
INTRAMUSCULAR | Status: AC
  Filled 2017-02-17: qty 1

## 2017-02-17 MED ORDER — FOLIC ACID 1 MG PO TABS
1.0000 mg | ORAL_TABLET | Freq: Every day | ORAL | Status: DC
  Administered 2017-02-17 – 2017-02-18 (×2): 1 mg via ORAL
  Filled 2017-02-17 (×2): qty 1

## 2017-02-17 MED ORDER — VITAMIN B-12 100 MCG PO TABS
100.0000 ug | ORAL_TABLET | Freq: Every day | ORAL | Status: DC
  Administered 2017-02-17 – 2017-02-18 (×2): 100 ug via ORAL
  Filled 2017-02-17 (×2): qty 1

## 2017-02-17 NOTE — Progress Notes (Signed)
Nutrition Brief Note  Patient identified on the Malnutrition Screening Tool (MST) Report  Pt's weight is stable since July 2018.   Wt Readings from Last 15 Encounters:  02/15/17 176 lb 9.4 oz (80.1 kg)  02/03/17 186 lb (84.4 kg)  02/01/17 186 lb (84.4 kg)  12/20/16 171 lb (77.6 kg)  08/28/16 171 lb 9.6 oz (77.8 kg)  08/09/16 174 lb (78.9 kg)  08/10/15 179 lb 12.8 oz (81.6 kg)  07/05/15 177 lb (80.3 kg)  05/03/15 177 lb 3.2 oz (80.4 kg)  01/24/15 187 lb (84.8 kg)  10/18/14 190 lb (86.2 kg)  09/02/14 187 lb (84.8 kg)  02/16/14 200 lb (90.7 kg)  12/07/13 193 lb (87.5 kg)  08/16/13 194 lb (88 kg)    Body mass index is 26.08 kg/m. Patient meets criteria for overweight based on current BMI.   Current diet order is NPO, patient was consuming approximately 75-100% of heart healthy meals prior to diet change. Labs and medications reviewed.   No nutrition interventions warranted at this time. If nutrition issues arise, please consult RD.   Clayton Bibles, MS, RD, [place] [facility] Pager: [phone] After Hours Pager: [phone]
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
## 2017-02-17 NOTE — Clinical Social Work Note (Signed)
Clinical Social Work Assessment  Patient Details  Name: Kevin Wiley MRN: [id number] Date of Birth: 10-09-1940  Date of referral:  02/17/17               Reason for consult:  Facility Placement                Permission sought to share information with:  Family Supports Permission granted to share information::  Yes, Verbal Permission Granted  Name::     [phone] Wife Jeanett Schlein  Agency::     Relationship::     Contact Information:     Housing/Transportation Living arrangements for the past 2 months:  Single Family Home Source of Information:  Patient, Medical Team Patient Interpreter Needed:  None Criminal Activity/Legal Involvement Pertinent to Current Situation/Hospitalization:  No - Comment as needed Significant Relationships:  Adult Children, Spouse, Friend Lives with:  Self, Spouse Do you feel safe going back to the place where you live?  Yes Need for family participation in patient care:  No (Coment)  Care giving concerns:  Pt from home where he resides with his wife. Son also nearby and supportive. At baseline pt ambulates independently with walker. States wife is disabled and he assists her usually. Currently needing assistance with transfers, not ambulating, and has fallen multiple times recently at home   Social Worker assessment / plan:  CSW consulted to assist with SNF placement. Pt admitted to treat dehydration/pain control. Met with pt at bedside- he was sitting up in chair, alert and oriented, hard of hearing. States he has been discussing SNF recommendation with his wife and they agree this is best option for him to get therapy with goal to return home with less risk of continued falls.  Pt lives in [place] and prefers facilities in that area- [facility] first choice but open to back up options. 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 States he has no acute symptoms- takes [place] for help with sleep.  Pt's [facility] in manual review due to PTSD dc- sent  requested information to [facility] and awaiting outcome. FL2 completed and referrals made. Will follow up with bed offers.  Employment status:  Retired Forensic scientist:  Medicare PT Recommendations:  [place] / Referral to community resources:  [place]  Patient/Family's Response to care:  Pt appreciative  Patient/Family's Understanding of and Emotional Response to Diagnosis, Current Treatment, and Prognosis:  Pt demonstrates adequate understanding of his treatment and plan. States, "I'm not thrilled to go to rehab again but I know I need it."  Emotional Assessment Appearance:  Appears stated age Attitude/Demeanor/Rapport:  Engaged Affect (typically observed):  Calm, Adaptable Orientation:  Oriented to Self, Oriented to Place, Oriented to  Time, Oriented to Situation Alcohol / Substance use:  Not Applicable Psych involvement (Current and /or in the community):  No (Comment)  Discharge Needs  Concerns to be addressed:  Discharge Planning Concerns Readmission within the last 30 days:  No Current discharge risk:  Dependent with Mobility Barriers to Discharge:  Continued Medical Work up   [employer], LCSW 02/17/2017, 2:29 PM  [phone]

## 2017-02-17 NOTE — NC FL2 (Signed)
[place] LEVEL OF CARE SCREENING TOOL     IDENTIFICATION  Patient Name: Kevin Wiley Birthdate: July 26, 1940 Sex: male Admission Date (Current Location): 02/15/2017  [facility] and [place] Number:  Herbalist and Address:  [facility],  [place] [address], [place]      Provider Number: [id number]  Attending Physician Name and Address:  Florencia Reasons, MD  Relative Name and Phone Number:       Current Level of Care: Hospital Recommended Level of Care: [place] Prior Approval Number:    Date Approved/Denied:   PASRR Number:    Discharge Plan: SNF    Current Diagnoses: Patient Active Problem List   Diagnosis Date Noted  . Intractable pain 02/15/2017  . Dehydration   . Anxiety and depression 08/28/2016  . Chest pain 07/04/2015  . Chest pain with high risk for cardiac etiology 07/04/2015  . Paroxysmal atrial tachycardia ([place]) 05/05/2015  . Low back pain 08/16/2013  . Neck pain 08/16/2013  . Peripheral neuropathy 06/14/2013  . Abnormality of gait 06/14/2013  . SSS (sick sinus syndrome) ([place]) 06/10/2013  . Second degree AV block 06/10/2013  . Elective replacement indicated for pacemaker 06/10/2013  . Ataxia 05/03/2013  . Altered mental status 05/03/2013  . Orthostatic hypotension 12/04/2012  . HTN (hypertension) 12/04/2012  . Hyperlipidemia, mixed 12/04/2012  . PTSD (post-traumatic stress disorder) 12/04/2012  . CAD (coronary artery disease) 12/04/2012  . Pacemaker dual chamber Medtronic 2007, new generator 06/11/13 12/04/2012  . Crohn disease ([place]) 12/04/2012  . Bilateral renal artery stenosis ([place]) 12/04/2012  . Fatigue 12/04/2012    Orientation RESPIRATION BLADDER Height & Weight     Self, Time, Situation, Place  Normal Continent Weight: 176 lb 9.4 oz (80.1 kg) Height:  5\' 9"  (175.3 cm)  BEHAVIORAL SYMPTOMS/MOOD NEUROLOGICAL BOWEL NUTRITION STATUS      Continent (NPO for procedure- see DC summary for updated  diet)  AMBULATORY STATUS COMMUNICATION OF NEEDS Skin   Extensive Assist Verbally Normal                       Personal Care Assistance Level of Assistance  Bathing, Feeding, Dressing Bathing Assistance: Limited assistance Feeding assistance: Independent Dressing Assistance: Limited assistance     Functional Limitations Info  Sight, Hearing, Speech Sight Info: Adequate Hearing Info: Impaired(hard of hearing) Speech Info: Adequate    SPECIAL CARE FACTORS FREQUENCY  PT (By licensed PT), OT (By licensed OT)     PT Frequency: 5x OT Frequency: 5x            Contractures Contractures Info: Not present    Additional Factors Info  Code Status, Allergies Code Status Info: full Allergies Info: nka           Current Medications (02/17/2017):  This is the current hospital active medication list Current Facility-Administered Medications  Medication Dose Route Frequency Provider Last Rate Last Dose  . 0.9 %  sodium chloride infusion   Intravenous Continuous Phillips Grout, MD 75 mL/hr at 02/17/17 0818    . acetaminophen (TYLENOL) tablet 650 mg  650 mg Oral Q6H PRN Florencia Reasons, MD   650 mg at 02/16/17 1528  . atenolol (TENORMIN) tablet 50 mg  50 mg Oral Daily Florencia Reasons, MD   50 mg at 02/17/17 0942  . atorvastatin (LIPITOR) tablet 80 mg  80 mg Oral Daily Florencia Reasons, MD   80 mg at 02/17/17 0942  . fentaNYL (SUBLIMAZE) injection  100 mcg  100 mcg Intravenous Q2H PRN Phillips Grout, MD   100 mcg at 02/16/17 0532  . gabapentin (NEURONTIN) capsule 800 mg  800 mg Oral TID Phillips Grout, MD   800 mg at 02/17/17 0942  . oxyCODONE (Oxy IR/ROXICODONE) immediate release tablet 5 mg  5 mg Oral Q4H PRN Florencia Reasons, MD   5 mg at 02/17/17 0325  . polyethylene glycol (MIRALAX / GLYCOLAX) packet 17 g  17 g Oral Daily Florencia Reasons, MD   17 g at 02/17/17 0942  . predniSONE (DELTASONE) tablet 50 mg  50 mg Oral Q breakfast Florencia Reasons, MD   50 mg at 02/17/17 0942  . senna-docusate (Senokot-S) tablet 1 tablet  1  tablet Oral BID Florencia Reasons, MD   1 tablet at 02/17/17 [id number]  . tamsulosin (FLOMAX) capsule 0.4 mg  0.4 mg Oral QPC supper Florencia Reasons, MD   0.4 mg at 02/16/17 1747  . tiZANidine (ZANAFLEX) tablet 2 mg  2 mg Oral TID Phillips Grout, MD   2 mg at 02/17/17 0942  . traMADol (ULTRAM) tablet 50 mg  50 mg Oral Q6H PRN Florencia Reasons, MD   50 mg at 02/16/17 2131  . zolpidem (AMBIEN) tablet 5 mg  5 mg Oral QHS PRN Neila Gear, NP   5 mg at 02/16/17 2311     Discharge Medications: Please see discharge summary for a list of discharge medications.  Relevant Imaging Results:  Relevant Lab Results:   Additional Information SS# [id number]  Nila Nephew, LCSW

## 2017-02-17 NOTE — Progress Notes (Signed)
Physical Therapy Treatment Patient Details Name: Kevin Wiley MRN: [id number] DOB: 10-04-1940 Today's Date: 02/17/2017    History of Present Illness Kevin Wiley is a 77 y.o. male with medical history significant of AAA, chronic back pain, crohns, PTSD fell at home and acutely injured his lower back pain and left hip pain. CT =Multilevel spondylosis of the lumbar spine without acute changes, multilevel foraminal stenosis;    PT Comments    Assisted OOB to amb a limited distance.  Back pain still present but improved.  Unsteady gait with excessive lean on walker and X 1 LOB when he turned his head to talk to me, therapist recovered.  Pt will need ST Rehab at SNF prior to D/C to home with spouse.    Follow Up Recommendations  SNF;Supervision/Assistance - 24 hour     Equipment Recommendations       Recommendations for Other Services       Precautions / Restrictions Precautions Precautions: Fall;Back Restrictions Weight Bearing Restrictions: No    Mobility  Bed Mobility Overal bed mobility: Needs Assistance Bed Mobility: Supine to Sit     Supine to sit: Min assist;Mod assist     General bed mobility comments: elevated HOB and increased time to guide b LE's off bed and scoot to EOB.  Transfers   Equipment used: Rolling walker (2 wheeled)             General transfer comment: <25% VC's on proper hand placement and turn completion.  Back pain improved with increased ability to self rise.    Ambulation/Gait Ambulation/Gait assistance: Min assist;Mod assist Ambulation Distance (Feet): 35 Feet Assistive device: Rolling walker (2 wheeled) Gait Pattern/deviations: Step-to pattern;Step-through pattern;Shuffle Gait velocity: decreased   General Gait Details: short shuffled steps with 50% VC's on proper walker to self distance and upright posture.  Mod lean on walker.  Back pain present but "not as bad as before".  MAX  unsteady gait with one LOB when he turned his head to  talk to me.  Therapist recovered.     Stairs            Wheelchair Mobility    Modified Rankin (Stroke Patients Only)       Balance                                            Cognition Arousal/Alertness: Awake/alert Behavior During Therapy: WFL for tasks assessed/performed Overall Cognitive Status: Within Functional Limits for tasks assessed                                 General Comments: very talkative but sweet      Exercises      General Comments        Pertinent Vitals/Pain Pain Assessment: Faces Faces Pain Scale: Hurts a little bit Pain Location: back Pain Descriptors / Indicators: Grimacing Pain Intervention(s): Monitored during session;Repositioned    Home Living                      Prior Function            PT Goals (current goals can now be found in the care plan section) Progress towards PT goals: Progressing toward goals    Frequency    Min 3X/week  PT Plan Current plan remains appropriate    Co-evaluation              AM-PAC PT "6 Clicks" Daily Activity  Outcome Measure  Difficulty turning over in bed (including adjusting bedclothes, sheets and blankets)?: Unable Difficulty moving from lying on back to sitting on the side of the bed? : Unable Difficulty sitting down on and standing up from a chair with arms (e.g., wheelchair, bedside commode, etc,.)?: Unable Help needed moving to and from a bed to chair (including a wheelchair)?: Total Help needed walking in hospital room?: Total Help needed climbing 3-5 steps with a railing? : Total 6 Click Score: 6    End of Session Equipment Utilized During Treatment: Gait belt Activity Tolerance: Patient tolerated treatment well;Patient limited by pain Patient left: in chair;with call bell/phone within reach Nurse Communication: Mobility status PT Visit Diagnosis: Unsteadiness on feet (R26.81)     Time: 1010-1036 PT Time  Calculation (min) (ACUTE ONLY): 26 min  Charges:  $Gait Training: 8-22 mins $Therapeutic Activity: 8-22 mins                    G Codes:       Kevin Wiley  PTA [facility]  Acute  Rehab Pager      [phone]

## 2017-02-18 ENCOUNTER — Encounter (HOSPITAL_COMMUNITY): Payer: Self-pay | Admitting: Interventional Radiology

## 2017-02-18 ENCOUNTER — Inpatient Hospital Stay (HOSPITAL_COMMUNITY): Payer: Medicare Other

## 2017-02-18 DIAGNOSIS — K5090 Crohn's disease, unspecified, without complications: Secondary | ICD-10-CM | POA: Diagnosis not present

## 2017-02-18 DIAGNOSIS — M5442 Lumbago with sciatica, left side: Secondary | ICD-10-CM | POA: Diagnosis not present

## 2017-02-18 DIAGNOSIS — I714 Abdominal aortic aneurysm, without rupture: Secondary | ICD-10-CM | POA: Diagnosis not present

## 2017-02-18 DIAGNOSIS — G8929 Other chronic pain: Secondary | ICD-10-CM | POA: Diagnosis not present

## 2017-02-18 DIAGNOSIS — G629 Polyneuropathy, unspecified: Secondary | ICD-10-CM | POA: Diagnosis not present

## 2017-02-18 DIAGNOSIS — M6281 Muscle weakness (generalized): Secondary | ICD-10-CM | POA: Diagnosis not present

## 2017-02-18 DIAGNOSIS — Z9181 History of falling: Secondary | ICD-10-CM | POA: Diagnosis not present

## 2017-02-18 DIAGNOSIS — E782 Mixed hyperlipidemia: Secondary | ICD-10-CM | POA: Diagnosis not present

## 2017-02-18 DIAGNOSIS — M545 Low back pain: Secondary | ICD-10-CM | POA: Diagnosis not present

## 2017-02-18 DIAGNOSIS — Z950 Presence of cardiac pacemaker: Secondary | ICD-10-CM | POA: Diagnosis not present

## 2017-02-18 DIAGNOSIS — R2681 Unsteadiness on feet: Secondary | ICD-10-CM | POA: Diagnosis not present

## 2017-02-18 DIAGNOSIS — I951 Orthostatic hypotension: Secondary | ICD-10-CM | POA: Diagnosis not present

## 2017-02-18 DIAGNOSIS — K219 Gastro-esophageal reflux disease without esophagitis: Secondary | ICD-10-CM | POA: Diagnosis not present

## 2017-02-18 DIAGNOSIS — I739 Peripheral vascular disease, unspecified: Secondary | ICD-10-CM | POA: Diagnosis not present

## 2017-02-18 DIAGNOSIS — M549 Dorsalgia, unspecified: Secondary | ICD-10-CM | POA: Diagnosis not present

## 2017-02-18 DIAGNOSIS — M5126 Other intervertebral disc displacement, lumbar region: Secondary | ICD-10-CM | POA: Diagnosis not present

## 2017-02-18 DIAGNOSIS — E871 Hypo-osmolality and hyponatremia: Secondary | ICD-10-CM | POA: Diagnosis not present

## 2017-02-18 DIAGNOSIS — R488 Other symbolic dysfunctions: Secondary | ICD-10-CM | POA: Diagnosis not present

## 2017-02-18 DIAGNOSIS — G8911 Acute pain due to trauma: Secondary | ICD-10-CM | POA: Diagnosis not present

## 2017-02-18 DIAGNOSIS — D649 Anemia, unspecified: Secondary | ICD-10-CM | POA: Diagnosis not present

## 2017-02-18 DIAGNOSIS — M47817 Spondylosis without myelopathy or radiculopathy, lumbosacral region: Secondary | ICD-10-CM | POA: Diagnosis not present

## 2017-02-18 DIAGNOSIS — E538 Deficiency of other specified B group vitamins: Secondary | ICD-10-CM | POA: Diagnosis not present

## 2017-02-18 DIAGNOSIS — R52 Pain, unspecified: Secondary | ICD-10-CM | POA: Diagnosis not present

## 2017-02-18 DIAGNOSIS — I2510 Atherosclerotic heart disease of native coronary artery without angina pectoris: Secondary | ICD-10-CM | POA: Diagnosis not present

## 2017-02-18 DIAGNOSIS — I10 Essential (primary) hypertension: Secondary | ICD-10-CM | POA: Diagnosis not present

## 2017-02-18 DIAGNOSIS — M1611 Unilateral primary osteoarthritis, right hip: Secondary | ICD-10-CM | POA: Diagnosis not present

## 2017-02-18 DIAGNOSIS — E860 Dehydration: Secondary | ICD-10-CM | POA: Diagnosis not present

## 2017-02-18 HISTORY — PX: IR EPIDUROGRAPHY: IMG2365

## 2017-02-18 LAB — BASIC METABOLIC PANEL
ANION GAP: 5 (ref 5–15)
BUN: 16 mg/dL (ref 6–20)
CHLORIDE: 107 mmol/L (ref 101–111)
CO2: 27 mmol/L (ref 22–32)
Calcium: 8.4 mg/dL — ABNORMAL LOW (ref 8.9–10.3)
Creatinine, Ser: 0.82 mg/dL (ref 0.61–1.24)
GFR calc non Af Amer: >60 mL/min (ref >60)
Glucose, Bld: 175 mg/dL — ABNORMAL HIGH (ref 65–99)
Potassium: 4.1 mmol/L (ref 3.5–5.1)
Sodium: 139 mmol/L (ref 135–145)

## 2017-02-18 LAB — CBC
HEMATOCRIT: 30.3 % — AB (ref 39.0–52.0)
Hemoglobin: 10.1 g/dL — ABNORMAL LOW (ref 13.0–17.0)
MCH: 29.4 pg (ref 26.0–34.0)
MCHC: 33.3 g/dL (ref 30.0–36.0)
MCV: 88.3 fL (ref 78.0–100.0)
PLATELETS: 168 10*3/uL (ref 150–400)
RBC: 3.43 MIL/uL — AB (ref 4.22–5.81)
RDW: 13.3 % (ref 11.5–15.5)
WBC: 9.5 10*3/uL (ref 4.0–10.5)

## 2017-02-18 LAB — PROTIME-INR
INR: 1.03
Prothrombin Time: 13.4 seconds (ref 11.4–15.2)

## 2017-02-18 LAB — MAGNESIUM: Magnesium: 2.1 mg/dL (ref 1.7–2.4)

## 2017-02-18 LAB — T4, FREE: Free T4: 0.8 ng/dL (ref 0.61–1.12)

## 2017-02-18 MED ORDER — SODIUM CHLORIDE 0.9 % IJ SOLN
INTRAMUSCULAR | Status: AC
  Filled 2017-02-18: qty 10

## 2017-02-18 MED ORDER — LIDOCAINE HCL (PF) 1 % IJ SOLN
INTRAMUSCULAR | Status: DC | PRN
  Administered 2017-02-18: 5 mL

## 2017-02-18 MED ORDER — METHYLPREDNISOLONE ACETATE 40 MG/ML IJ SUSP
INTRAMUSCULAR | Status: AC
  Filled 2017-02-18: qty 1

## 2017-02-18 MED ORDER — FOLIC ACID 1 MG PO TABS: 1.0000 mg | ORAL_TABLET | Freq: Every day | ORAL | 0 refills | Status: DC

## 2017-02-18 MED ORDER — METHYLPREDNISOLONE ACETATE 80 MG/ML IJ SUSP
INTRAMUSCULAR | Status: AC
  Filled 2017-02-18: qty 1

## 2017-02-18 MED ORDER — TRAZODONE HCL 100 MG PO TABS
100.0000 mg | ORAL_TABLET | Freq: Every day | ORAL | 0 refills | Status: DC
Start: 2017-02-18 — End: 2018-03-16

## 2017-02-18 MED ORDER — LIDOCAINE HCL (PF) 1 % IJ SOLN
INTRAMUSCULAR | Status: AC
  Filled 2017-02-18: qty 30

## 2017-02-18 MED ORDER — IOPAMIDOL (ISOVUE-M 200) INJECTION 41%
INTRAMUSCULAR | Status: AC
  Administered 2017-02-18: 14:00:00
  Filled 2017-02-18: qty 10

## 2017-02-18 MED ORDER — PREDNISONE 50 MG PO TABS: 50.0000 mg | ORAL_TABLET | Freq: Every day | ORAL | 0 refills | Status: AC

## 2017-02-18 MED ORDER — POLYETHYLENE GLYCOL 3350 17 G PO PACK: 17.0000 g | PACK | Freq: Every day | ORAL | 0 refills | Status: DC

## 2017-02-18 MED ORDER — SENNOSIDES-DOCUSATE SODIUM 8.6-50 MG PO TABS: 1.0000 | ORAL_TABLET | Freq: Every day | ORAL | 0 refills | Status: DC

## 2017-02-18 MED ORDER — CYANOCOBALAMIN 100 MCG PO TABS: 100.0000 ug | ORAL_TABLET | Freq: Every day | ORAL | 0 refills | Status: DC

## 2017-02-18 MED ORDER — HYDROCODONE-ACETAMINOPHEN 5-325 MG PO TABS: 1.0000 | ORAL_TABLET | ORAL | 0 refills | Status: DC | PRN

## 2017-02-18 NOTE — Progress Notes (Signed)
Provided SNF bed offers to pt. He prefers [facility] reached out to facility to inquire as to bed availability today.  Pt states he is making arrangements for his son to help check in on his wife daily as "I usually help her at home."   Sharren Bridge, MSW, [place] Work 02/18/2017 [phone]

## 2017-02-18 NOTE — Progress Notes (Signed)
Physical Therapy Treatment Patient Details Name: Kevin Wiley MRN: [id number] DOB: May 14, 1940 Today's Date: 02/18/2017    History of Present Illness Kevin Wiley is a 77 y.o. male with medical history significant of AAA, chronic back pain, crohns, PTSD fell at home and acutely injured his lower back pain and left hip pain. CT =Multilevel spondylosis of the lumbar spine without acute changes, multilevel foraminal stenosis;    PT Comments    Pt had steriod injection earlier and stated "instant relief".  Much happier and more able to self mobilize.  Amb a greater distance and no LOB.  Pt will still benefit from ST Rehab at SNF prior to returning home.   Follow Up Recommendations  SNF;Supervision/Assistance - 24 hour     Equipment Recommendations       Recommendations for Other Services       Precautions / Restrictions Precautions Precautions: Fall;Back Restrictions Weight Bearing Restrictions: No    Mobility  Bed Mobility Overal bed mobility: Needs Assistance Bed Mobility: Supine to Sit;Sit to Supine     Supine to sit: Min guard Sit to supine: Min guard;Min assist   General bed mobility comments: increased time with use of rail  Transfers Overall transfer level: Needs assistance Equipment used: Rolling walker (2 wheeled);None Transfers: Sit to/from [employer] to Stand: Min guard Stand pivot transfers: Min guard       General transfer comment: <25% VC's on proper hand placement.  Increased ability to self perfrom.  Ambulation/Gait Ambulation/Gait assistance: Min guard;Min assist Ambulation Distance (Feet): 85 Feet Assistive device: Rolling walker (2 wheeled) Gait Pattern/deviations: Step-to pattern;Step-through pattern;Shuffle Gait velocity: decreased   General Gait Details: Much improved gait with less lean on walker and increased balance.  No c/o back  pain after steriod injection.     Stairs            Wheelchair Mobility     Modified Rankin (Stroke Patients Only)       Balance                                            Cognition Arousal/Alertness: Awake/alert Behavior During Therapy: WFL for tasks assessed/performed Overall Cognitive Status: Within Functional Limits for tasks assessed                                 General Comments: very talkative but sweet      Exercises      General Comments        Pertinent Vitals/Pain Pain Assessment: Faces Faces Pain Scale: No hurt Pain Location: back    Home Living                      Prior Function            PT Goals (current goals can now be found in the care plan section) Progress towards PT goals: Progressing toward goals    Frequency    Min 3X/week      PT Plan Current plan remains appropriate    Co-evaluation              AM-PAC PT "6 Clicks" Daily Activity  Outcome Measure  Difficulty turning over in bed (including adjusting bedclothes, sheets and blankets)?: Unable Difficulty moving from lying on back to sitting  on the side of the bed? : Unable Difficulty sitting down on and standing up from a chair with arms (e.g., wheelchair, bedside commode, etc,.)?: Unable Help needed moving to and from a bed to chair (including a wheelchair)?: Total Help needed walking in hospital room?: Total Help needed climbing 3-5 steps with a railing? : Total 6 Click Score: 6    End of Session Equipment Utilized During Treatment: Gait belt Activity Tolerance: Patient tolerated treatment well Patient left: in bed Nurse Communication: Mobility status PT Visit Diagnosis: Unsteadiness on feet (R26.81)     Time: 4388-8757 PT Time Calculation (min) (ACUTE ONLY): 25 min  Charges:  $Gait Training: 8-22 mins $Therapeutic Activity: 8-22 mins                    G Codes:       Rica Koyanagi  PTA [facility]  Acute  Rehab Pager      [phone]

## 2017-02-18 NOTE — Progress Notes (Signed)
CM consult for home health needs. Per CSW, pt is for SNF placement. CM will follow along as needed. Marney Doctor RN,BSN,NCM [phone]

## 2017-02-18 NOTE — Progress Notes (Addendum)
Case mgt found Pt OOB; bed alarm was going off; staff entered room; got Pt off floor to [facility]. Pt's condom cath came off per Pt, he needed to urinate. Instead of calling staff, Pt said he was trying to use BR. VSS, paged DR.  Addendum: post fall huddle completed, safety zone to follow

## 2017-02-18 NOTE — Procedures (Signed)
R L3/4 Epidural steroid inj 120 mg depo 3.5 cc 1 % lido EBL 0 Comp 0

## 2017-02-18 NOTE — Clinical Social Work Placement (Signed)
Pt discharged and will admit to [facility] for short term rehab. Report#[id number]. Left voicemail for pt's son and attempted to inform wife- pt states "My son is at work he'll get the voicemail later" and "My wife probably won't come to the phone- he'll (son) go tell her" [facility] transportation arranged All information sent to facility via the [place] See below for placement details   CLINICAL SOCIAL WORK PLACEMENT  NOTE  Date:  02/18/2017  Patient Details  Name: Kevin Wiley MRN: [id number] Date of Birth: 01-19-1941  Clinical Social Work is seeking post-discharge placement for this patient at the [place] level of care (*CSW will initial, date and re-position this form in  chart as items are completed):  Yes   Patient/family provided with [place] Work Department's list of facilities offering this level of care within the geographic area requested by the patient (or if unable, by the patient's family).  Yes   Patient/family informed of their freedom to choose among providers that offer the needed level of care, that participate in Medicare, Medicaid or managed care program needed by the patient, have an available bed and are willing to accept the patient.  Yes   Patient/family informed of [facility]'s ownership interest in [facility] and [facility], as well as of the fact that they are under no obligation to receive care at these facilities.  PASRR submitted to EDS on 02/17/17     PASRR number received on 02/17/17     Existing PASRR number confirmed on       FL2 transmitted to all facilities in geographic area requested by pt/family on 02/17/17     FL2 transmitted to all facilities within larger geographic area on       Patient informed that his/her managed care company has contracts with or will negotiate with certain facilities, including the following:        Yes   Patient/family informed of bed offers received.  Patient  chooses bed at [facility] and [place] recommends and patient chooses bed at [facility]    Patient to be transferred to [facility] on 02/18/17.  Patient to be transferred to facility by [facility]     Patient family notified on 02/18/17 of transfer.  Name of family member notified:  Son Shanon Brow     PHYSICIAN       Additional Comment:    _______________________________________________ Nila Nephew, LCSW 02/18/2017, 4:35 PM
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
## 2017-02-19 ENCOUNTER — Encounter: Payer: Self-pay | Admitting: Internal Medicine

## 2017-02-19 ENCOUNTER — Non-Acute Institutional Stay (SKILLED_NURSING_FACILITY): Payer: Medicare Other | Admitting: Internal Medicine

## 2017-02-19 DIAGNOSIS — I10 Essential (primary) hypertension: Secondary | ICD-10-CM

## 2017-02-19 DIAGNOSIS — M5442 Lumbago with sciatica, left side: Secondary | ICD-10-CM | POA: Diagnosis not present

## 2017-02-19 DIAGNOSIS — E871 Hypo-osmolality and hyponatremia: Secondary | ICD-10-CM | POA: Diagnosis not present

## 2017-02-19 DIAGNOSIS — R52 Pain, unspecified: Secondary | ICD-10-CM

## 2017-02-19 DIAGNOSIS — G8929 Other chronic pain: Secondary | ICD-10-CM | POA: Diagnosis not present

## 2017-02-19 DIAGNOSIS — G629 Polyneuropathy, unspecified: Secondary | ICD-10-CM

## 2017-02-19 DIAGNOSIS — E860 Dehydration: Secondary | ICD-10-CM | POA: Diagnosis not present

## 2017-02-19 DIAGNOSIS — E538 Deficiency of other specified B group vitamins: Secondary | ICD-10-CM | POA: Diagnosis not present

## 2017-02-19 DIAGNOSIS — D649 Anemia, unspecified: Secondary | ICD-10-CM

## 2017-02-19 DIAGNOSIS — E782 Mixed hyperlipidemia: Secondary | ICD-10-CM

## 2017-02-21 LAB — CULTURE, BLOOD (ROUTINE X 2)
Culture: NO GROWTH
Culture: NO GROWTH
Special Requests: ADEQUATE
Special Requests: ADEQUATE

## 2017-02-23 ENCOUNTER — Encounter: Payer: Self-pay | Admitting: Internal Medicine

## 2017-02-23 DIAGNOSIS — E538 Deficiency of other specified B group vitamins: Secondary | ICD-10-CM | POA: Insufficient documentation

## 2017-02-23 DIAGNOSIS — E871 Hypo-osmolality and hyponatremia: Secondary | ICD-10-CM | POA: Insufficient documentation

## 2017-02-23 DIAGNOSIS — D649 Anemia, unspecified: Secondary | ICD-10-CM | POA: Insufficient documentation
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
## 2017-03-17 ENCOUNTER — Encounter: Payer: Self-pay | Admitting: Internal Medicine

## 2017-03-17 ENCOUNTER — Non-Acute Institutional Stay (SKILLED_NURSING_FACILITY): Payer: Medicare Other | Admitting: Internal Medicine

## 2017-03-17 DIAGNOSIS — G8929 Other chronic pain: Secondary | ICD-10-CM

## 2017-03-17 DIAGNOSIS — E538 Deficiency of other specified B group vitamins: Secondary | ICD-10-CM | POA: Diagnosis not present

## 2017-03-17 DIAGNOSIS — R52 Pain, unspecified: Secondary | ICD-10-CM

## 2017-03-17 DIAGNOSIS — E782 Mixed hyperlipidemia: Secondary | ICD-10-CM

## 2017-03-17 DIAGNOSIS — E871 Hypo-osmolality and hyponatremia: Secondary | ICD-10-CM

## 2017-03-17 DIAGNOSIS — D649 Anemia, unspecified: Secondary | ICD-10-CM

## 2017-03-17 DIAGNOSIS — G629 Polyneuropathy, unspecified: Secondary | ICD-10-CM

## 2017-03-17 DIAGNOSIS — M5442 Lumbago with sciatica, left side: Secondary | ICD-10-CM

## 2017-03-17 DIAGNOSIS — I10 Essential (primary) hypertension: Secondary | ICD-10-CM

## 2017-03-17 DIAGNOSIS — K219 Gastro-esophageal reflux disease without esophagitis: Secondary | ICD-10-CM | POA: Insufficient documentation
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
## 2017-03-20 DIAGNOSIS — M48062 Spinal stenosis, lumbar region with neurogenic claudication: Secondary | ICD-10-CM | POA: Diagnosis not present

## 2017-03-20 DIAGNOSIS — M47816 Spondylosis without myelopathy or radiculopathy, lumbar region: Secondary | ICD-10-CM | POA: Diagnosis not present

## 2017-03-20 DIAGNOSIS — M5126 Other intervertebral disc displacement, lumbar region: Secondary | ICD-10-CM | POA: Diagnosis not present

## 2017-03-20 DIAGNOSIS — M5136 Other intervertebral disc degeneration, lumbar region: Secondary | ICD-10-CM | POA: Diagnosis not present

## 2017-03-20 DIAGNOSIS — M546 Pain in thoracic spine: Secondary | ICD-10-CM | POA: Diagnosis not present

## 2017-03-20 DIAGNOSIS — M4726 Other spondylosis with radiculopathy, lumbar region: Secondary | ICD-10-CM | POA: Diagnosis not present

## 2017-03-21 ENCOUNTER — Encounter: Payer: Medicare Other | Admitting: *Deleted

## 2017-03-21 DIAGNOSIS — F3289 Other specified depressive episodes: Secondary | ICD-10-CM | POA: Diagnosis not present

## 2017-03-21 DIAGNOSIS — M5126 Other intervertebral disc displacement, lumbar region: Secondary | ICD-10-CM | POA: Diagnosis not present

## 2017-03-21 DIAGNOSIS — I70213 Atherosclerosis of native arteries of extremities with intermittent claudication, bilateral legs: Secondary | ICD-10-CM | POA: Diagnosis not present

## 2017-03-21 DIAGNOSIS — F329 Major depressive disorder, single episode, unspecified: Secondary | ICD-10-CM | POA: Diagnosis not present

## 2017-03-21 DIAGNOSIS — M47816 Spondylosis without myelopathy or radiculopathy, lumbar region: Secondary | ICD-10-CM | POA: Diagnosis not present

## 2017-03-21 DIAGNOSIS — Z950 Presence of cardiac pacemaker: Secondary | ICD-10-CM | POA: Diagnosis not present

## 2017-03-21 DIAGNOSIS — F4310 Post-traumatic stress disorder, unspecified: Secondary | ICD-10-CM | POA: Diagnosis not present

## 2017-03-21 DIAGNOSIS — W19XXXS Unspecified fall, sequela: Secondary | ICD-10-CM | POA: Diagnosis not present

## 2017-03-21 DIAGNOSIS — Z9181 History of falling: Secondary | ICD-10-CM | POA: Diagnosis not present

## 2017-03-21 DIAGNOSIS — M160 Bilateral primary osteoarthritis of hip: Secondary | ICD-10-CM | POA: Diagnosis not present

## 2017-03-21 DIAGNOSIS — M4716 Other spondylosis with myelopathy, lumbar region: Secondary | ICD-10-CM | POA: Diagnosis not present

## 2017-03-21 DIAGNOSIS — I480 Paroxysmal atrial fibrillation: Secondary | ICD-10-CM | POA: Diagnosis not present

## 2017-03-21 DIAGNOSIS — I951 Orthostatic hypotension: Secondary | ICD-10-CM | POA: Diagnosis not present

## 2017-03-21 DIAGNOSIS — I10 Essential (primary) hypertension: Secondary | ICD-10-CM | POA: Diagnosis not present

## 2017-03-21 DIAGNOSIS — I2510 Atherosclerotic heart disease of native coronary artery without angina pectoris: Secondary | ICD-10-CM | POA: Diagnosis not present

## 2017-03-21 DIAGNOSIS — F17211 Nicotine dependence, cigarettes, in remission: Secondary | ICD-10-CM | POA: Diagnosis not present

## 2017-03-24 DIAGNOSIS — M5126 Other intervertebral disc displacement, lumbar region: Secondary | ICD-10-CM | POA: Diagnosis not present

## 2017-03-24 DIAGNOSIS — I2510 Atherosclerotic heart disease of native coronary artery without angina pectoris: Secondary | ICD-10-CM | POA: Diagnosis not present

## 2017-03-24 DIAGNOSIS — I10 Essential (primary) hypertension: Secondary | ICD-10-CM | POA: Diagnosis not present

## 2017-03-24 DIAGNOSIS — M160 Bilateral primary osteoarthritis of hip: Secondary | ICD-10-CM | POA: Diagnosis not present

## 2017-03-24 DIAGNOSIS — W19XXXS Unspecified fall, sequela: Secondary | ICD-10-CM | POA: Diagnosis not present

## 2017-03-24 DIAGNOSIS — M47816 Spondylosis without myelopathy or radiculopathy, lumbar region: Secondary | ICD-10-CM | POA: Diagnosis not present

## 2017-03-25 DIAGNOSIS — M47816 Spondylosis without myelopathy or radiculopathy, lumbar region: Secondary | ICD-10-CM | POA: Diagnosis not present

## 2017-03-25 DIAGNOSIS — I2510 Atherosclerotic heart disease of native coronary artery without angina pectoris: Secondary | ICD-10-CM | POA: Diagnosis not present

## 2017-03-25 DIAGNOSIS — I10 Essential (primary) hypertension: Secondary | ICD-10-CM | POA: Diagnosis not present

## 2017-03-25 DIAGNOSIS — W19XXXS Unspecified fall, sequela: Secondary | ICD-10-CM | POA: Diagnosis not present

## 2017-03-25 DIAGNOSIS — M160 Bilateral primary osteoarthritis of hip: Secondary | ICD-10-CM | POA: Diagnosis not present

## 2017-03-25 DIAGNOSIS — M5126 Other intervertebral disc displacement, lumbar region: Secondary | ICD-10-CM | POA: Diagnosis not present

## 2017-03-26 ENCOUNTER — Encounter: Payer: Self-pay | Admitting: Cardiology

## 2017-03-27 LAB — CUP PACEART INCLINIC DEVICE CHECK
Date Time Interrogation Session: 20190221090637
Implantable Lead Implant Date: 20070906
Implantable Lead Implant Date: 20070906
Implantable Lead Location: 753859
Implantable Lead Location: 753860
Implantable Lead Model: 4092
Implantable Lead Model: 5594
Implantable Pulse Generator Implant Date: 20150507

## 2017-03-28 DIAGNOSIS — I10 Essential (primary) hypertension: Secondary | ICD-10-CM | POA: Diagnosis not present

## 2017-03-28 DIAGNOSIS — M5126 Other intervertebral disc displacement, lumbar region: Secondary | ICD-10-CM | POA: Diagnosis not present

## 2017-03-28 DIAGNOSIS — I2510 Atherosclerotic heart disease of native coronary artery without angina pectoris: Secondary | ICD-10-CM | POA: Diagnosis not present

## 2017-03-28 DIAGNOSIS — M160 Bilateral primary osteoarthritis of hip: Secondary | ICD-10-CM | POA: Diagnosis not present

## 2017-03-28 DIAGNOSIS — W19XXXS Unspecified fall, sequela: Secondary | ICD-10-CM | POA: Diagnosis not present

## 2017-03-28 DIAGNOSIS — M47816 Spondylosis without myelopathy or radiculopathy, lumbar region: Secondary | ICD-10-CM | POA: Diagnosis not present

## 2017-04-02 DIAGNOSIS — M5126 Other intervertebral disc displacement, lumbar region: Secondary | ICD-10-CM | POA: Diagnosis not present

## 2017-04-02 DIAGNOSIS — M47816 Spondylosis without myelopathy or radiculopathy, lumbar region: Secondary | ICD-10-CM | POA: Diagnosis not present

## 2017-04-02 DIAGNOSIS — I2510 Atherosclerotic heart disease of native coronary artery without angina pectoris: Secondary | ICD-10-CM | POA: Diagnosis not present

## 2017-04-02 DIAGNOSIS — I10 Essential (primary) hypertension: Secondary | ICD-10-CM | POA: Diagnosis not present

## 2017-04-02 DIAGNOSIS — M160 Bilateral primary osteoarthritis of hip: Secondary | ICD-10-CM | POA: Diagnosis not present

## 2017-04-02 DIAGNOSIS — W19XXXS Unspecified fall, sequela: Secondary | ICD-10-CM | POA: Diagnosis not present

## 2017-04-04 ENCOUNTER — Telehealth: Payer: Self-pay | Admitting: Cardiovascular Disease

## 2017-04-04 DIAGNOSIS — M160 Bilateral primary osteoarthritis of hip: Secondary | ICD-10-CM | POA: Diagnosis not present

## 2017-04-04 DIAGNOSIS — M47816 Spondylosis without myelopathy or radiculopathy, lumbar region: Secondary | ICD-10-CM | POA: Diagnosis not present

## 2017-04-04 DIAGNOSIS — I10 Essential (primary) hypertension: Secondary | ICD-10-CM | POA: Diagnosis not present

## 2017-04-04 DIAGNOSIS — M5126 Other intervertebral disc displacement, lumbar region: Secondary | ICD-10-CM | POA: Diagnosis not present

## 2017-04-04 DIAGNOSIS — W19XXXS Unspecified fall, sequela: Secondary | ICD-10-CM | POA: Diagnosis not present

## 2017-04-04 DIAGNOSIS — I2510 Atherosclerotic heart disease of native coronary artery without angina pectoris: Secondary | ICD-10-CM | POA: Diagnosis not present

## 2017-04-04 NOTE — Telephone Encounter (Signed)
New Message   Kevin Wiley is calling with [facility]. She is doing a home visit with the patient today and advised that is heart rate is running in the 70's and 80's. She states that the patient indicated that his pacemaker is set to the 60's. Please call to discuss.

## 2017-04-04 NOTE — Telephone Encounter (Signed)
Spoke with sherah @home  health she states that when she arrived apt pt's home his HR was in the 60's but steadily went up to the 80's and she thought that she should let Dr Sallyanne Kuster know

## 2017-04-07 DIAGNOSIS — M5136 Other intervertebral disc degeneration, lumbar region: Secondary | ICD-10-CM | POA: Diagnosis not present

## 2017-04-07 DIAGNOSIS — M47817 Spondylosis without myelopathy or radiculopathy, lumbosacral region: Secondary | ICD-10-CM | POA: Diagnosis not present

## 2017-04-07 DIAGNOSIS — M545 Low back pain: Secondary | ICD-10-CM | POA: Diagnosis not present

## 2017-04-08 ENCOUNTER — Encounter: Payer: Self-pay | Admitting: Cardiovascular Disease

## 2017-04-08 DIAGNOSIS — R300 Dysuria: Secondary | ICD-10-CM | POA: Diagnosis not present

## 2017-04-08 DIAGNOSIS — N3281 Overactive bladder: Secondary | ICD-10-CM | POA: Diagnosis not present

## 2017-04-09 DIAGNOSIS — I10 Essential (primary) hypertension: Secondary | ICD-10-CM | POA: Diagnosis not present

## 2017-04-09 DIAGNOSIS — M5126 Other intervertebral disc displacement, lumbar region: Secondary | ICD-10-CM | POA: Diagnosis not present

## 2017-04-09 DIAGNOSIS — M160 Bilateral primary osteoarthritis of hip: Secondary | ICD-10-CM | POA: Diagnosis not present

## 2017-04-09 DIAGNOSIS — W19XXXS Unspecified fall, sequela: Secondary | ICD-10-CM | POA: Diagnosis not present

## 2017-04-09 DIAGNOSIS — I2510 Atherosclerotic heart disease of native coronary artery without angina pectoris: Secondary | ICD-10-CM | POA: Diagnosis not present

## 2017-04-09 DIAGNOSIS — M47816 Spondylosis without myelopathy or radiculopathy, lumbar region: Secondary | ICD-10-CM | POA: Diagnosis not present

## 2017-04-11 DIAGNOSIS — M47816 Spondylosis without myelopathy or radiculopathy, lumbar region: Secondary | ICD-10-CM | POA: Diagnosis not present

## 2017-04-11 DIAGNOSIS — W19XXXS Unspecified fall, sequela: Secondary | ICD-10-CM | POA: Diagnosis not present

## 2017-04-11 DIAGNOSIS — M160 Bilateral primary osteoarthritis of hip: Secondary | ICD-10-CM | POA: Diagnosis not present

## 2017-04-11 DIAGNOSIS — I10 Essential (primary) hypertension: Secondary | ICD-10-CM | POA: Diagnosis not present

## 2017-04-11 DIAGNOSIS — M5126 Other intervertebral disc displacement, lumbar region: Secondary | ICD-10-CM | POA: Diagnosis not present

## 2017-04-11 DIAGNOSIS — I2510 Atherosclerotic heart disease of native coronary artery without angina pectoris: Secondary | ICD-10-CM | POA: Diagnosis not present

## 2017-04-15 DIAGNOSIS — I10 Essential (primary) hypertension: Secondary | ICD-10-CM | POA: Diagnosis not present

## 2017-04-15 DIAGNOSIS — M5126 Other intervertebral disc displacement, lumbar region: Secondary | ICD-10-CM | POA: Diagnosis not present

## 2017-04-15 DIAGNOSIS — I2510 Atherosclerotic heart disease of native coronary artery without angina pectoris: Secondary | ICD-10-CM | POA: Diagnosis not present

## 2017-04-15 DIAGNOSIS — W19XXXS Unspecified fall, sequela: Secondary | ICD-10-CM | POA: Diagnosis not present

## 2017-04-15 DIAGNOSIS — M47816 Spondylosis without myelopathy or radiculopathy, lumbar region: Secondary | ICD-10-CM | POA: Diagnosis not present

## 2017-04-15 DIAGNOSIS — M160 Bilateral primary osteoarthritis of hip: Secondary | ICD-10-CM | POA: Diagnosis not present

## 2017-04-17 DIAGNOSIS — Z79899 Other long term (current) drug therapy: Secondary | ICD-10-CM | POA: Diagnosis not present

## 2017-04-17 DIAGNOSIS — M47816 Spondylosis without myelopathy or radiculopathy, lumbar region: Secondary | ICD-10-CM | POA: Diagnosis not present

## 2017-04-17 DIAGNOSIS — Z9181 History of falling: Secondary | ICD-10-CM | POA: Diagnosis not present

## 2017-04-17 DIAGNOSIS — M160 Bilateral primary osteoarthritis of hip: Secondary | ICD-10-CM | POA: Diagnosis not present

## 2017-04-17 DIAGNOSIS — Z6826 Body mass index (BMI) 26.0-26.9, adult: Secondary | ICD-10-CM | POA: Diagnosis not present

## 2017-04-17 DIAGNOSIS — M5126 Other intervertebral disc displacement, lumbar region: Secondary | ICD-10-CM | POA: Diagnosis not present

## 2017-04-17 DIAGNOSIS — M545 Low back pain: Secondary | ICD-10-CM | POA: Diagnosis not present

## 2017-04-17 DIAGNOSIS — W19XXXS Unspecified fall, sequela: Secondary | ICD-10-CM | POA: Diagnosis not present

## 2017-04-17 DIAGNOSIS — I10 Essential (primary) hypertension: Secondary | ICD-10-CM | POA: Diagnosis not present

## 2017-04-17 DIAGNOSIS — I2510 Atherosclerotic heart disease of native coronary artery without angina pectoris: Secondary | ICD-10-CM | POA: Diagnosis not present

## 2017-04-18 DIAGNOSIS — M47816 Spondylosis without myelopathy or radiculopathy, lumbar region: Secondary | ICD-10-CM | POA: Diagnosis not present

## 2017-04-18 DIAGNOSIS — I2510 Atherosclerotic heart disease of native coronary artery without angina pectoris: Secondary | ICD-10-CM | POA: Diagnosis not present

## 2017-04-18 DIAGNOSIS — I10 Essential (primary) hypertension: Secondary | ICD-10-CM | POA: Diagnosis not present

## 2017-04-18 DIAGNOSIS — M5126 Other intervertebral disc displacement, lumbar region: Secondary | ICD-10-CM | POA: Diagnosis not present

## 2017-04-18 DIAGNOSIS — W19XXXS Unspecified fall, sequela: Secondary | ICD-10-CM | POA: Diagnosis not present

## 2017-04-18 DIAGNOSIS — M160 Bilateral primary osteoarthritis of hip: Secondary | ICD-10-CM | POA: Diagnosis not present

## 2017-04-22 DIAGNOSIS — M47816 Spondylosis without myelopathy or radiculopathy, lumbar region: Secondary | ICD-10-CM | POA: Diagnosis not present

## 2017-04-22 DIAGNOSIS — I2510 Atherosclerotic heart disease of native coronary artery without angina pectoris: Secondary | ICD-10-CM | POA: Diagnosis not present

## 2017-04-22 DIAGNOSIS — M160 Bilateral primary osteoarthritis of hip: Secondary | ICD-10-CM | POA: Diagnosis not present

## 2017-04-22 DIAGNOSIS — I10 Essential (primary) hypertension: Secondary | ICD-10-CM | POA: Diagnosis not present

## 2017-04-22 DIAGNOSIS — W19XXXS Unspecified fall, sequela: Secondary | ICD-10-CM | POA: Diagnosis not present

## 2017-04-22 DIAGNOSIS — M5126 Other intervertebral disc displacement, lumbar region: Secondary | ICD-10-CM | POA: Diagnosis not present

## 2017-04-24 DIAGNOSIS — M47816 Spondylosis without myelopathy or radiculopathy, lumbar region: Secondary | ICD-10-CM | POA: Diagnosis not present

## 2017-04-24 DIAGNOSIS — I2510 Atherosclerotic heart disease of native coronary artery without angina pectoris: Secondary | ICD-10-CM | POA: Diagnosis not present

## 2017-04-24 DIAGNOSIS — M160 Bilateral primary osteoarthritis of hip: Secondary | ICD-10-CM | POA: Diagnosis not present

## 2017-04-24 DIAGNOSIS — M5126 Other intervertebral disc displacement, lumbar region: Secondary | ICD-10-CM | POA: Diagnosis not present

## 2017-04-24 DIAGNOSIS — I10 Essential (primary) hypertension: Secondary | ICD-10-CM | POA: Diagnosis not present

## 2017-04-24 DIAGNOSIS — W19XXXS Unspecified fall, sequela: Secondary | ICD-10-CM | POA: Diagnosis not present

## 2017-04-29 DIAGNOSIS — M5126 Other intervertebral disc displacement, lumbar region: Secondary | ICD-10-CM | POA: Diagnosis not present

## 2017-04-29 DIAGNOSIS — I10 Essential (primary) hypertension: Secondary | ICD-10-CM | POA: Diagnosis not present

## 2017-04-29 DIAGNOSIS — I2510 Atherosclerotic heart disease of native coronary artery without angina pectoris: Secondary | ICD-10-CM | POA: Diagnosis not present

## 2017-04-29 DIAGNOSIS — W19XXXS Unspecified fall, sequela: Secondary | ICD-10-CM | POA: Diagnosis not present

## 2017-04-29 DIAGNOSIS — M160 Bilateral primary osteoarthritis of hip: Secondary | ICD-10-CM | POA: Diagnosis not present

## 2017-04-29 DIAGNOSIS — M47816 Spondylosis without myelopathy or radiculopathy, lumbar region: Secondary | ICD-10-CM | POA: Diagnosis not present

## 2017-05-01 DIAGNOSIS — I10 Essential (primary) hypertension: Secondary | ICD-10-CM | POA: Diagnosis not present

## 2017-05-01 DIAGNOSIS — W19XXXS Unspecified fall, sequela: Secondary | ICD-10-CM | POA: Diagnosis not present

## 2017-05-01 DIAGNOSIS — M160 Bilateral primary osteoarthritis of hip: Secondary | ICD-10-CM | POA: Diagnosis not present

## 2017-05-01 DIAGNOSIS — I2510 Atherosclerotic heart disease of native coronary artery without angina pectoris: Secondary | ICD-10-CM | POA: Diagnosis not present

## 2017-05-01 DIAGNOSIS — M47816 Spondylosis without myelopathy or radiculopathy, lumbar region: Secondary | ICD-10-CM | POA: Diagnosis not present

## 2017-05-01 DIAGNOSIS — M5126 Other intervertebral disc displacement, lumbar region: Secondary | ICD-10-CM | POA: Diagnosis not present

## 2017-05-07 DIAGNOSIS — M5126 Other intervertebral disc displacement, lumbar region: Secondary | ICD-10-CM | POA: Diagnosis not present

## 2017-05-07 DIAGNOSIS — W19XXXS Unspecified fall, sequela: Secondary | ICD-10-CM | POA: Diagnosis not present

## 2017-05-07 DIAGNOSIS — M160 Bilateral primary osteoarthritis of hip: Secondary | ICD-10-CM | POA: Diagnosis not present

## 2017-05-07 DIAGNOSIS — I10 Essential (primary) hypertension: Secondary | ICD-10-CM | POA: Diagnosis not present

## 2017-05-07 DIAGNOSIS — I2510 Atherosclerotic heart disease of native coronary artery without angina pectoris: Secondary | ICD-10-CM | POA: Diagnosis not present

## 2017-05-07 DIAGNOSIS — M47816 Spondylosis without myelopathy or radiculopathy, lumbar region: Secondary | ICD-10-CM | POA: Diagnosis not present

## 2017-05-09 DIAGNOSIS — M5126 Other intervertebral disc displacement, lumbar region: Secondary | ICD-10-CM | POA: Diagnosis not present

## 2017-05-09 DIAGNOSIS — M47816 Spondylosis without myelopathy or radiculopathy, lumbar region: Secondary | ICD-10-CM | POA: Diagnosis not present

## 2017-05-09 DIAGNOSIS — W19XXXS Unspecified fall, sequela: Secondary | ICD-10-CM | POA: Diagnosis not present

## 2017-05-09 DIAGNOSIS — M160 Bilateral primary osteoarthritis of hip: Secondary | ICD-10-CM | POA: Diagnosis not present

## 2017-05-09 DIAGNOSIS — I2510 Atherosclerotic heart disease of native coronary artery without angina pectoris: Secondary | ICD-10-CM | POA: Diagnosis not present

## 2017-05-09 DIAGNOSIS — I10 Essential (primary) hypertension: Secondary | ICD-10-CM | POA: Diagnosis not present

## 2017-05-13 DIAGNOSIS — M5126 Other intervertebral disc displacement, lumbar region: Secondary | ICD-10-CM | POA: Diagnosis not present

## 2017-05-13 DIAGNOSIS — M160 Bilateral primary osteoarthritis of hip: Secondary | ICD-10-CM | POA: Diagnosis not present

## 2017-05-13 DIAGNOSIS — I2510 Atherosclerotic heart disease of native coronary artery without angina pectoris: Secondary | ICD-10-CM | POA: Diagnosis not present

## 2017-05-13 DIAGNOSIS — I10 Essential (primary) hypertension: Secondary | ICD-10-CM | POA: Diagnosis not present

## 2017-05-13 DIAGNOSIS — W19XXXS Unspecified fall, sequela: Secondary | ICD-10-CM | POA: Diagnosis not present

## 2017-05-13 DIAGNOSIS — M47816 Spondylosis without myelopathy or radiculopathy, lumbar region: Secondary | ICD-10-CM | POA: Diagnosis not present

## 2017-05-22 DIAGNOSIS — M25511 Pain in right shoulder: Secondary | ICD-10-CM | POA: Diagnosis not present

## 2017-05-22 DIAGNOSIS — M545 Low back pain: Secondary | ICD-10-CM | POA: Diagnosis not present

## 2017-05-22 DIAGNOSIS — M259 Joint disorder, unspecified: Secondary | ICD-10-CM | POA: Diagnosis not present

## 2017-05-22 DIAGNOSIS — M19011 Primary osteoarthritis, right shoulder: Secondary | ICD-10-CM | POA: Diagnosis not present

## 2017-05-22 DIAGNOSIS — M19019 Primary osteoarthritis, unspecified shoulder: Secondary | ICD-10-CM | POA: Diagnosis not present

## 2017-05-28 ENCOUNTER — Other Ambulatory Visit: Payer: Self-pay

## 2017-05-28 DIAGNOSIS — E782 Mixed hyperlipidemia: Secondary | ICD-10-CM

## 2017-05-28 DIAGNOSIS — Z79899 Other long term (current) drug therapy: Secondary | ICD-10-CM

## 2017-06-12 DIAGNOSIS — M533 Sacrococcygeal disorders, not elsewhere classified: Secondary | ICD-10-CM | POA: Diagnosis not present

## 2017-06-19 DIAGNOSIS — M533 Sacrococcygeal disorders, not elsewhere classified: Secondary | ICD-10-CM | POA: Diagnosis not present

## 2017-07-04 DIAGNOSIS — M533 Sacrococcygeal disorders, not elsewhere classified: Secondary | ICD-10-CM | POA: Diagnosis not present

## 2017-07-04 DIAGNOSIS — Z6825 Body mass index (BMI) 25.0-25.9, adult: Secondary | ICD-10-CM | POA: Diagnosis not present

## 2017-07-04 DIAGNOSIS — Z79899 Other long term (current) drug therapy: Secondary | ICD-10-CM | POA: Diagnosis not present

## 2017-07-29 ENCOUNTER — Encounter: Payer: Self-pay | Admitting: Cardiology

## 2017-08-29 ENCOUNTER — Encounter: Payer: Self-pay | Admitting: Cardiology

## 2017-09-08 ENCOUNTER — Other Ambulatory Visit: Payer: Self-pay | Admitting: Cardiovascular Disease

## 2017-09-08 DIAGNOSIS — I701 Atherosclerosis of renal artery: Secondary | ICD-10-CM

## 2017-09-09 ENCOUNTER — Ambulatory Visit
Admission: RE | Admit: 2017-09-09 | Discharge: 2017-09-09 | Disposition: A | Discharge status: Home / Self Care | Payer: Medicare Other | Source: Ambulatory Visit | Attending: Gastroenterology | Admitting: Gastroenterology

## 2017-09-09 ENCOUNTER — Other Ambulatory Visit: Payer: Self-pay | Admitting: Gastroenterology

## 2017-09-09 DIAGNOSIS — K5900 Constipation, unspecified: Secondary | ICD-10-CM | POA: Diagnosis not present

## 2017-09-09 DIAGNOSIS — K5901 Slow transit constipation: Secondary | ICD-10-CM

## 2017-09-09 DIAGNOSIS — R1084 Generalized abdominal pain: Secondary | ICD-10-CM

## 2017-09-19 ENCOUNTER — Ambulatory Visit (HOSPITAL_COMMUNITY)
Admission: RE | Admit: 2017-09-19 | Discharge: 2017-09-19 | Disposition: A | Discharge status: Home / Self Care | Payer: Medicare Other | Source: Ambulatory Visit | Attending: Cardiology | Admitting: Cardiology

## 2017-09-19 DIAGNOSIS — I701 Atherosclerosis of renal artery: Secondary | ICD-10-CM | POA: Insufficient documentation

## 2017-10-02 DIAGNOSIS — Z86010 Personal history of colonic polyps: Secondary | ICD-10-CM | POA: Diagnosis not present

## 2017-10-02 DIAGNOSIS — R194 Change in bowel habit: Secondary | ICD-10-CM | POA: Diagnosis not present

## 2017-10-15 ENCOUNTER — Ambulatory Visit (INDEPENDENT_AMBULATORY_CARE_PROVIDER_SITE_OTHER): Payer: Medicare Other | Admitting: Cardiovascular Disease

## 2017-10-15 VITALS — BP 136/78 | HR 90 | Ht 69.0 in | Wt 174.6 lb

## 2017-10-15 DIAGNOSIS — I495 Sick sinus syndrome: Secondary | ICD-10-CM

## 2017-10-15 DIAGNOSIS — I951 Orthostatic hypotension: Secondary | ICD-10-CM | POA: Diagnosis not present

## 2017-10-15 DIAGNOSIS — I10 Essential (primary) hypertension: Secondary | ICD-10-CM | POA: Diagnosis not present

## 2017-10-15 DIAGNOSIS — E782 Mixed hyperlipidemia: Secondary | ICD-10-CM | POA: Diagnosis not present

## 2017-10-15 DIAGNOSIS — Z950 Presence of cardiac pacemaker: Secondary | ICD-10-CM

## 2017-10-15 DIAGNOSIS — I701 Atherosclerosis of renal artery: Secondary | ICD-10-CM | POA: Diagnosis not present

## 2017-10-15 DIAGNOSIS — I471 Supraventricular tachycardia: Secondary | ICD-10-CM

## 2017-10-15 DIAGNOSIS — I2510 Atherosclerotic heart disease of native coronary artery without angina pectoris: Secondary | ICD-10-CM

## 2017-10-15 DIAGNOSIS — I441 Atrioventricular block, second degree: Secondary | ICD-10-CM

## 2017-10-15 LAB — LIPID PANEL
Chol/HDL Ratio: 4.8 ratio (ref 0.0–5.0)
Cholesterol, Total: 190 mg/dL (ref 100–199)
HDL: 40 mg/dL (ref >39)
LDL Calculated: 123 mg/dL — ABNORMAL HIGH (ref 0–99)
Triglycerides: 136 mg/dL (ref 0–149)
VLDL Cholesterol Cal: 27 mg/dL (ref 5–40)

## 2017-10-15 MED ORDER — NITROGLYCERIN 0.4 MG SL SUBL: 0.4000 mg | SUBLINGUAL_TABLET | SUBLINGUAL | 3 refills | Status: DC | PRN
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
## 2017-10-15 NOTE — Patient Instructions (Signed)
Dr Sallyanne Kuster recommends that you continue on your current medications as directed. Please refer to the Current Medication list given to you today.  Your physician recommends that you return for lab work at your earliest [place].  Remote monitoring is used to monitor your Pacemaker or ICD from home. This monitoring reduces the number of office visits required to check your device to one time per year. It allows [place] to keep an eye on the functioning of your device to ensure it is working properly. You are scheduled for a device check from home on Wednesday, December 11th, 2019. You may send your transmission at any time that day. If you have a wireless device, the transmission will be sent automatically. After your physician reviews your transmission, you will receive a postcard with your next transmission date.  To improve our patient care and to more adequately follow your device, [facility] has decided, as a practice, to start following each patient four times a year with your home monitor. This means that you may experience a remote appointment that is close to an in-office appointment with your physician. Your insurance will apply at the same rate as other remote monitoring transmissions.  Dr Sallyanne Kuster recommends that you schedule a follow-up appointment in 12 months with a pacemaker check. You will receive a reminder letter in the mail two months in advance. If you don't receive a letter, please call our office to schedule the follow-up appointment.  If you need a refill on your cardiac medications before your next appointment, please call your pharmacy.

## 2017-10-16 ENCOUNTER — Encounter: Payer: Self-pay | Admitting: Cardiovascular Disease

## 2017-10-28 DIAGNOSIS — K5901 Slow transit constipation: Secondary | ICD-10-CM | POA: Diagnosis not present

## 2017-10-28 DIAGNOSIS — Z86010 Personal history of colonic polyps: Secondary | ICD-10-CM | POA: Diagnosis not present

## 2017-11-05 ENCOUNTER — Telehealth: Payer: Self-pay

## 2017-11-05 DIAGNOSIS — I701 Atherosclerosis of renal artery: Secondary | ICD-10-CM

## 2017-11-05 NOTE — Telephone Encounter (Signed)
-----   Message from Sanda Klein, MD sent at 09/19/2017  1:18 PM EDT ----- Both renal artery stents patent. Please repeat in 12 months

## 2017-11-05 NOTE — Telephone Encounter (Signed)
Repeat study ordered.

## 2017-11-20 DIAGNOSIS — D122 Benign neoplasm of ascending colon: Secondary | ICD-10-CM | POA: Diagnosis not present

## 2017-11-20 DIAGNOSIS — K621 Rectal polyp: Secondary | ICD-10-CM | POA: Diagnosis not present

## 2017-11-20 DIAGNOSIS — D125 Benign neoplasm of sigmoid colon: Secondary | ICD-10-CM | POA: Diagnosis not present

## 2017-11-20 DIAGNOSIS — K635 Polyp of colon: Secondary | ICD-10-CM | POA: Diagnosis not present

## 2017-11-20 DIAGNOSIS — K640 First degree hemorrhoids: Secondary | ICD-10-CM | POA: Diagnosis not present

## 2017-11-20 DIAGNOSIS — K5730 Diverticulosis of large intestine without perforation or abscess without bleeding: Secondary | ICD-10-CM | POA: Diagnosis not present

## 2017-11-20 DIAGNOSIS — Z86010 Personal history of colonic polyps: Secondary | ICD-10-CM | POA: Diagnosis not present

## 2017-11-20 DIAGNOSIS — D123 Benign neoplasm of transverse colon: Secondary | ICD-10-CM | POA: Diagnosis not present

## 2017-11-25 DIAGNOSIS — D122 Benign neoplasm of ascending colon: Secondary | ICD-10-CM | POA: Diagnosis not present

## 2017-11-25 DIAGNOSIS — K635 Polyp of colon: Secondary | ICD-10-CM | POA: Diagnosis not present

## 2017-11-25 DIAGNOSIS — D123 Benign neoplasm of transverse colon: Secondary | ICD-10-CM | POA: Diagnosis not present

## 2017-11-25 DIAGNOSIS — D125 Benign neoplasm of sigmoid colon: Secondary | ICD-10-CM | POA: Diagnosis not present

## 2017-12-25 DIAGNOSIS — M19011 Primary osteoarthritis, right shoulder: Secondary | ICD-10-CM | POA: Diagnosis not present

## 2017-12-25 DIAGNOSIS — M7541 Impingement syndrome of right shoulder: Secondary | ICD-10-CM | POA: Diagnosis not present

## 2018-01-14 ENCOUNTER — Telehealth: Payer: Self-pay | Admitting: Cardiology

## 2018-01-14 NOTE — Telephone Encounter (Signed)
Spoke with pt and reminded pt of remote transmission that is due today. Pt verbalized understanding.   

## 2018-01-16 ENCOUNTER — Encounter: Payer: Self-pay | Admitting: Cardiology

## 2018-02-17 DIAGNOSIS — M533 Sacrococcygeal disorders, not elsewhere classified: Secondary | ICD-10-CM | POA: Diagnosis not present

## 2018-02-24 ENCOUNTER — Inpatient Hospital Stay (HOSPITAL_COMMUNITY)
Admission: EM | Admit: 2018-02-24 | Discharge: 2018-02-28 | DRG: 304 | Disposition: A | Discharge status: Skilled Nursing Facility | Payer: Medicare Other | Attending: Internal Medicine | Admitting: Internal Medicine

## 2018-02-24 ENCOUNTER — Other Ambulatory Visit: Payer: Self-pay

## 2018-02-24 ENCOUNTER — Emergency Department (HOSPITAL_COMMUNITY): Payer: Medicare Other

## 2018-02-24 ENCOUNTER — Encounter (HOSPITAL_COMMUNITY): Payer: Self-pay | Admitting: Emergency Medicine

## 2018-02-24 ENCOUNTER — Observation Stay (HOSPITAL_COMMUNITY): Payer: Medicare Other

## 2018-02-24 DIAGNOSIS — Z79899 Other long term (current) drug therapy: Secondary | ICD-10-CM

## 2018-02-24 DIAGNOSIS — I714 Abdominal aortic aneurysm, without rupture: Secondary | ICD-10-CM | POA: Diagnosis present

## 2018-02-24 DIAGNOSIS — R52 Pain, unspecified: Secondary | ICD-10-CM

## 2018-02-24 DIAGNOSIS — I10 Essential (primary) hypertension: Secondary | ICD-10-CM | POA: Diagnosis not present

## 2018-02-24 DIAGNOSIS — R1084 Generalized abdominal pain: Secondary | ICD-10-CM | POA: Diagnosis not present

## 2018-02-24 DIAGNOSIS — F329 Major depressive disorder, single episode, unspecified: Secondary | ICD-10-CM | POA: Diagnosis present

## 2018-02-24 DIAGNOSIS — I471 Supraventricular tachycardia: Secondary | ICD-10-CM | POA: Diagnosis present

## 2018-02-24 DIAGNOSIS — R0902 Hypoxemia: Secondary | ICD-10-CM | POA: Diagnosis not present

## 2018-02-24 DIAGNOSIS — I160 Hypertensive urgency: Secondary | ICD-10-CM | POA: Diagnosis not present

## 2018-02-24 DIAGNOSIS — K5090 Crohn's disease, unspecified, without complications: Secondary | ICD-10-CM | POA: Diagnosis present

## 2018-02-24 DIAGNOSIS — M1611 Unilateral primary osteoarthritis, right hip: Secondary | ICD-10-CM | POA: Diagnosis present

## 2018-02-24 DIAGNOSIS — K219 Gastro-esophageal reflux disease without esophagitis: Secondary | ICD-10-CM | POA: Diagnosis present

## 2018-02-24 DIAGNOSIS — Z950 Presence of cardiac pacemaker: Secondary | ICD-10-CM | POA: Diagnosis present

## 2018-02-24 DIAGNOSIS — F4310 Post-traumatic stress disorder, unspecified: Secondary | ICD-10-CM | POA: Diagnosis present

## 2018-02-24 DIAGNOSIS — R1013 Epigastric pain: Secondary | ICD-10-CM | POA: Diagnosis not present

## 2018-02-24 DIAGNOSIS — G92 Toxic encephalopathy: Secondary | ICD-10-CM | POA: Diagnosis not present

## 2018-02-24 DIAGNOSIS — R5383 Other fatigue: Secondary | ICD-10-CM | POA: Diagnosis present

## 2018-02-24 DIAGNOSIS — E538 Deficiency of other specified B group vitamins: Secondary | ICD-10-CM | POA: Diagnosis present

## 2018-02-24 DIAGNOSIS — R109 Unspecified abdominal pain: Secondary | ICD-10-CM | POA: Diagnosis not present

## 2018-02-24 DIAGNOSIS — M545 Low back pain: Secondary | ICD-10-CM | POA: Diagnosis present

## 2018-02-24 DIAGNOSIS — I2510 Atherosclerotic heart disease of native coronary artery without angina pectoris: Secondary | ICD-10-CM | POA: Diagnosis present

## 2018-02-24 DIAGNOSIS — I480 Paroxysmal atrial fibrillation: Secondary | ICD-10-CM | POA: Diagnosis present

## 2018-02-24 DIAGNOSIS — Z8249 Family history of ischemic heart disease and other diseases of the circulatory system: Secondary | ICD-10-CM

## 2018-02-24 DIAGNOSIS — Z87891 Personal history of nicotine dependence: Secondary | ICD-10-CM

## 2018-02-24 DIAGNOSIS — Z955 Presence of coronary angioplasty implant and graft: Secondary | ICD-10-CM

## 2018-02-24 DIAGNOSIS — F40240 Claustrophobia: Secondary | ICD-10-CM | POA: Diagnosis present

## 2018-02-24 DIAGNOSIS — M2550 Pain in unspecified joint: Secondary | ICD-10-CM | POA: Diagnosis not present

## 2018-02-24 DIAGNOSIS — K5900 Constipation, unspecified: Secondary | ICD-10-CM | POA: Diagnosis not present

## 2018-02-24 DIAGNOSIS — I719 Aortic aneurysm of unspecified site, without rupture: Secondary | ICD-10-CM | POA: Diagnosis not present

## 2018-02-24 DIAGNOSIS — R112 Nausea with vomiting, unspecified: Secondary | ICD-10-CM | POA: Diagnosis present

## 2018-02-24 DIAGNOSIS — G4730 Sleep apnea, unspecified: Secondary | ICD-10-CM | POA: Diagnosis present

## 2018-02-24 DIAGNOSIS — G629 Polyneuropathy, unspecified: Secondary | ICD-10-CM

## 2018-02-24 DIAGNOSIS — N400 Enlarged prostate without lower urinary tract symptoms: Secondary | ICD-10-CM | POA: Diagnosis present

## 2018-02-24 DIAGNOSIS — W19XXXA Unspecified fall, initial encounter: Secondary | ICD-10-CM

## 2018-02-24 DIAGNOSIS — I739 Peripheral vascular disease, unspecified: Secondary | ICD-10-CM | POA: Diagnosis present

## 2018-02-24 DIAGNOSIS — G8929 Other chronic pain: Secondary | ICD-10-CM | POA: Diagnosis present

## 2018-02-24 DIAGNOSIS — M479 Spondylosis, unspecified: Secondary | ICD-10-CM | POA: Diagnosis present

## 2018-02-24 DIAGNOSIS — I4719 Other supraventricular tachycardia: Secondary | ICD-10-CM | POA: Diagnosis present

## 2018-02-24 DIAGNOSIS — R51 Headache: Secondary | ICD-10-CM | POA: Diagnosis not present

## 2018-02-24 DIAGNOSIS — E782 Mixed hyperlipidemia: Secondary | ICD-10-CM | POA: Diagnosis present

## 2018-02-24 DIAGNOSIS — I701 Atherosclerosis of renal artery: Secondary | ICD-10-CM | POA: Diagnosis present

## 2018-02-24 LAB — COMPREHENSIVE METABOLIC PANEL
ALBUMIN: 3.8 g/dL (ref 3.5–5.0)
ALT: 22 U/L (ref 0–44)
ANION GAP: 11 (ref 5–15)
AST: 20 U/L (ref 15–41)
Alkaline Phosphatase: 65 U/L (ref 38–126)
BUN: 8 mg/dL (ref 8–23)
CALCIUM: 8.9 mg/dL (ref 8.9–10.3)
CHLORIDE: 99 mmol/L (ref 98–111)
CO2: 27 mmol/L (ref 22–32)
Creatinine, Ser: 0.92 mg/dL (ref 0.61–1.24)
GFR calc Af Amer: >60 mL/min (ref >60)
GFR calc non Af Amer: >60 mL/min (ref >60)
GLUCOSE: 119 mg/dL — AB (ref 70–99)
POTASSIUM: 3.9 mmol/L (ref 3.5–5.1)
SODIUM: 137 mmol/L (ref 135–145)
Total Bilirubin: 0.6 mg/dL (ref 0.3–1.2)
Total Protein: 6.8 g/dL (ref 6.5–8.1)

## 2018-02-24 LAB — CBC WITH DIFFERENTIAL/PLATELET
Abs Immature Granulocytes: 0.04 10*3/uL (ref 0.00–0.07)
Basophils Absolute: 0 10*3/uL (ref 0.0–0.1)
Basophils Relative: 0 %
Eosinophils Absolute: 0 10*3/uL (ref 0.0–0.5)
Eosinophils Relative: 0 %
HCT: 40.6 % (ref 39.0–52.0)
HEMOGLOBIN: 13.3 g/dL (ref 13.0–17.0)
Immature Granulocytes: 1 %
LYMPHS ABS: 1.2 10*3/uL (ref 0.7–4.0)
Lymphocytes Relative: 14 %
MCH: 29.9 pg (ref 26.0–34.0)
MCHC: 32.8 g/dL (ref 30.0–36.0)
MCV: 91.2 fL (ref 80.0–100.0)
MONOS PCT: 6 %
Monocytes Absolute: 0.5 10*3/uL (ref 0.1–1.0)
NEUTROS ABS: 6.9 10*3/uL (ref 1.7–7.7)
NEUTROS PCT: 79 %
PLATELETS: 198 10*3/uL (ref 150–400)
RBC: 4.45 MIL/uL (ref 4.22–5.81)
RDW: 13.9 % (ref 11.5–15.5)
WBC: 8.8 10*3/uL (ref 4.0–10.5)
nRBC: 0 % (ref 0.0–0.2)

## 2018-02-24 LAB — URINALYSIS, ROUTINE W REFLEX MICROSCOPIC
Bilirubin Urine: NEGATIVE
GLUCOSE, UA: NEGATIVE mg/dL
Hgb urine dipstick: NEGATIVE
Ketones, ur: NEGATIVE mg/dL
Leukocytes, UA: NEGATIVE
Nitrite: NEGATIVE
Protein, ur: NEGATIVE mg/dL
Specific Gravity, Urine: 1.025 (ref 1.005–1.030)
pH: 8 (ref 5.0–8.0)

## 2018-02-24 LAB — I-STAT TROPONIN, ED
TROPONIN I, POC: 0.01 ng/mL (ref 0.00–0.08)
TROPONIN I, POC: 0.01 ng/mL (ref 0.00–0.08)

## 2018-02-24 LAB — INFLUENZA PANEL BY PCR (TYPE A & B)
Influenza A By PCR: NEGATIVE
Influenza B By PCR: NEGATIVE

## 2018-02-24 LAB — I-STAT CREATININE, ED: CREATININE: 0.7 mg/dL (ref 0.61–1.24)

## 2018-02-24 LAB — LACTIC ACID, PLASMA
LACTIC ACID, VENOUS: 1.6 mmol/L (ref 0.5–1.9)
Lactic Acid, Venous: 1.6 mmol/L (ref 0.5–1.9)

## 2018-02-24 LAB — LIPASE, BLOOD: Lipase: 23 U/L (ref 11–51)

## 2018-02-24 LAB — TROPONIN I: Troponin I: <0.03 ng/mL (ref <0.03)

## 2018-02-24 MED ORDER — TRAZODONE HCL 100 MG PO TABS
100.0000 mg | ORAL_TABLET | Freq: Every day | ORAL | Status: DC
  Filled 2018-02-24: qty 1

## 2018-02-24 MED ORDER — HYDRALAZINE HCL 50 MG PO TABS
50.0000 mg | ORAL_TABLET | Freq: Three times a day (TID) | ORAL | Status: DC
  Administered 2018-02-24 – 2018-02-28 (×11): 50 mg via ORAL
  Filled 2018-02-24: qty 1
  Filled 2018-02-24: qty 2
  Filled 2018-02-24 (×9): qty 1

## 2018-02-24 MED ORDER — ONDANSETRON HCL 4 MG PO TABS: 4.0000 mg | ORAL_TABLET | Freq: Four times a day (QID) | ORAL | Status: DC | PRN

## 2018-02-24 MED ORDER — CLONIDINE HCL 0.1 MG/24HR TD PTWK
0.1000 mg | MEDICATED_PATCH | TRANSDERMAL | Status: DC
  Administered 2018-02-24: 0.1 mg via TRANSDERMAL
  Filled 2018-02-24: qty 1

## 2018-02-24 MED ORDER — HYDROCODONE-ACETAMINOPHEN 5-325 MG PO TABS
1.0000 | ORAL_TABLET | Freq: Four times a day (QID) | ORAL | Status: DC | PRN
  Administered 2018-02-24 – 2018-02-27 (×6): 1 via ORAL
  Filled 2018-02-24 (×6): qty 1

## 2018-02-24 MED ORDER — ALUM & MAG HYDROXIDE-SIMETH 200-200-20 MG/5ML PO SUSP
30.0000 mL | ORAL | Status: DC | PRN
  Administered 2018-02-24: 30 mL via ORAL
  Filled 2018-02-24: qty 30

## 2018-02-24 MED ORDER — TRAZODONE HCL 100 MG PO TABS
200.0000 mg | ORAL_TABLET | Freq: Every day | ORAL | Status: DC
  Administered 2018-02-24 – 2018-02-27 (×4): 200 mg via ORAL
  Filled 2018-02-24 (×4): qty 2

## 2018-02-24 MED ORDER — ONDANSETRON HCL 4 MG/2ML IJ SOLN
4.0000 mg | Freq: Four times a day (QID) | INTRAMUSCULAR | Status: DC | PRN
  Administered 2018-02-24: 4 mg via INTRAVENOUS
  Filled 2018-02-24: qty 2

## 2018-02-24 MED ORDER — BUSPIRONE HCL 5 MG PO TABS
5.0000 mg | ORAL_TABLET | Freq: Two times a day (BID) | ORAL | Status: DC
  Administered 2018-02-24 – 2018-02-28 (×8): 5 mg via ORAL
  Filled 2018-02-24 (×8): qty 1

## 2018-02-24 MED ORDER — ONDANSETRON HCL 4 MG/2ML IJ SOLN
4.0000 mg | Freq: Once | INTRAMUSCULAR | Status: AC
  Administered 2018-02-24: 4 mg via INTRAVENOUS
  Filled 2018-02-24: qty 2

## 2018-02-24 MED ORDER — ENOXAPARIN SODIUM 40 MG/0.4ML ~~LOC~~ SOLN
40.0000 mg | SUBCUTANEOUS | Status: DC
  Administered 2018-02-25 – 2018-02-27 (×3): 40 mg via SUBCUTANEOUS
  Filled 2018-02-24 (×3): qty 0.4

## 2018-02-24 MED ORDER — ACETAMINOPHEN 325 MG PO TABS
650.0000 mg | ORAL_TABLET | Freq: Four times a day (QID) | ORAL | Status: DC | PRN
  Administered 2018-02-24 – 2018-02-25 (×3): 650 mg via ORAL
  Filled 2018-02-24 (×3): qty 2

## 2018-02-24 MED ORDER — PANTOPRAZOLE SODIUM 40 MG IV SOLR
40.0000 mg | INTRAVENOUS | Status: DC
  Administered 2018-02-24: 40 mg via INTRAVENOUS
  Filled 2018-02-24: qty 40

## 2018-02-24 MED ORDER — MORPHINE SULFATE (PF) 4 MG/ML IV SOLN
4.0000 mg | Freq: Once | INTRAVENOUS | Status: AC
  Administered 2018-02-24: 4 mg via INTRAVENOUS
  Filled 2018-02-24: qty 1

## 2018-02-24 MED ORDER — HYDRALAZINE HCL 20 MG/ML IJ SOLN
10.0000 mg | INTRAMUSCULAR | Status: DC | PRN
  Administered 2018-02-24 – 2018-02-25 (×2): 10 mg via INTRAVENOUS
  Filled 2018-02-24 (×2): qty 1

## 2018-02-24 MED ORDER — ATORVASTATIN CALCIUM 80 MG PO TABS
80.0000 mg | ORAL_TABLET | Freq: Every day | ORAL | Status: DC
  Administered 2018-02-24 – 2018-02-27 (×4): 80 mg via ORAL
  Filled 2018-02-24 (×4): qty 1

## 2018-02-24 MED ORDER — TAMSULOSIN HCL 0.4 MG PO CAPS
0.4000 mg | ORAL_CAPSULE | Freq: Every day | ORAL | Status: DC
  Administered 2018-02-25 – 2018-02-27 (×3): 0.4 mg via ORAL
  Filled 2018-02-24 (×3): qty 1

## 2018-02-24 MED ORDER — ALUM & MAG HYDROXIDE-SIMETH 200-200-20 MG/5ML PO SUSP
15.0000 mL | Freq: Once | ORAL | Status: AC
  Administered 2018-02-24: 15 mL via ORAL
  Filled 2018-02-24: qty 30

## 2018-02-24 MED ORDER — ATENOLOL 25 MG PO TABS
100.0000 mg | ORAL_TABLET | Freq: Every day | ORAL | Status: DC
  Administered 2018-02-24 – 2018-02-28 (×5): 100 mg via ORAL
  Filled 2018-02-24 (×3): qty 4
  Filled 2018-02-24: qty 2
  Filled 2018-02-24: qty 4

## 2018-02-24 MED ORDER — HYDRALAZINE HCL 25 MG PO TABS
50.0000 mg | ORAL_TABLET | Freq: Once | ORAL | Status: AC
  Administered 2018-02-24: 50 mg via ORAL
  Filled 2018-02-24: qty 2

## 2018-02-24 MED ORDER — VENLAFAXINE HCL ER 75 MG PO CP24
225.0000 mg | ORAL_CAPSULE | Freq: Every day | ORAL | Status: DC
  Administered 2018-02-25 – 2018-02-28 (×4): 225 mg via ORAL
  Filled 2018-02-24 (×4): qty 1

## 2018-02-24 MED ORDER — SODIUM CHLORIDE 0.9 % IV BOLUS
1000.0000 mL | Freq: Once | INTRAVENOUS | Status: AC
  Administered 2018-02-24: 1000 mL via INTRAVENOUS

## 2018-02-24 MED ORDER — IOPAMIDOL (ISOVUE-370) INJECTION 76%
100.0000 mL | Freq: Once | INTRAVENOUS | Status: AC | PRN
  Administered 2018-02-24: 100 mL via INTRAVENOUS

## 2018-02-24 MED ORDER — NITROGLYCERIN 0.4 MG SL SUBL: 0.4000 mg | SUBLINGUAL_TABLET | SUBLINGUAL | Status: DC | PRN

## 2018-02-24 MED ORDER — ZOLPIDEM TARTRATE 5 MG PO TABS
5.0000 mg | ORAL_TABLET | Freq: Every evening | ORAL | Status: DC | PRN
  Administered 2018-02-24 – 2018-02-25 (×2): 5 mg via ORAL
  Filled 2018-02-24 (×3): qty 1

## 2018-02-24 MED ORDER — ACETAMINOPHEN 650 MG RE SUPP: 650.0000 mg | Freq: Four times a day (QID) | RECTAL | Status: DC | PRN

## 2018-02-24 MED ORDER — SODIUM CHLORIDE 0.9% FLUSH
3.0000 mL | Freq: Two times a day (BID) | INTRAVENOUS | Status: DC
  Administered 2018-02-24 – 2018-02-27 (×6): 3 mL via INTRAVENOUS

## 2018-02-24 MED ORDER — GABAPENTIN 400 MG PO CAPS
800.0000 mg | ORAL_CAPSULE | Freq: Three times a day (TID) | ORAL | Status: DC
  Administered 2018-02-24 – 2018-02-28 (×11): 800 mg via ORAL
  Filled 2018-02-24 (×11): qty 2

## 2018-02-24 MED ORDER — VENLAFAXINE HCL ER 75 MG PO CP24: 125.0000 mg | ORAL_CAPSULE | Freq: Every day | ORAL | Status: DC

## 2018-02-24 NOTE — ED Notes (Signed)
Pt given sprite to drink. 

## 2018-02-24 NOTE — Discharge Instructions (Addendum)
You had a aneurism to the first part of your aorta.  This needs to be follow up for repeat imaging every 6 months to see if there is any change.

## 2018-02-24 NOTE — ED Notes (Signed)
Patient had got out of bed and urinated on floor and self. Patient was cleaned up and assisted back to bed.

## 2018-02-24 NOTE — ED Triage Notes (Signed)
Pt presents to ED from [facility]. Pt complains of constipation x3 days. Pt also complains of lower left leg pain.   BP 212/114 HR 90 RR 22 O2 98%
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
## 2018-02-24 NOTE — ED Notes (Signed)
Pt also states he has not taken his BP meds since yesterday

## 2018-02-25 ENCOUNTER — Observation Stay (HOSPITAL_COMMUNITY): Payer: Medicare Other

## 2018-02-25 DIAGNOSIS — I471 Supraventricular tachycardia: Secondary | ICD-10-CM

## 2018-02-25 DIAGNOSIS — G629 Polyneuropathy, unspecified: Secondary | ICD-10-CM | POA: Diagnosis present

## 2018-02-25 DIAGNOSIS — E782 Mixed hyperlipidemia: Secondary | ICD-10-CM | POA: Diagnosis present

## 2018-02-25 DIAGNOSIS — I2510 Atherosclerotic heart disease of native coronary artery without angina pectoris: Secondary | ICD-10-CM

## 2018-02-25 DIAGNOSIS — D649 Anemia, unspecified: Secondary | ICD-10-CM | POA: Diagnosis not present

## 2018-02-25 DIAGNOSIS — I714 Abdominal aortic aneurysm, without rupture: Secondary | ICD-10-CM | POA: Diagnosis present

## 2018-02-25 DIAGNOSIS — I701 Atherosclerosis of renal artery: Secondary | ICD-10-CM | POA: Diagnosis present

## 2018-02-25 DIAGNOSIS — E538 Deficiency of other specified B group vitamins: Secondary | ICD-10-CM | POA: Diagnosis present

## 2018-02-25 DIAGNOSIS — I351 Nonrheumatic aortic (valve) insufficiency: Secondary | ICD-10-CM

## 2018-02-25 DIAGNOSIS — G92 Toxic encephalopathy: Secondary | ICD-10-CM | POA: Diagnosis present

## 2018-02-25 DIAGNOSIS — I739 Peripheral vascular disease, unspecified: Secondary | ICD-10-CM | POA: Diagnosis present

## 2018-02-25 DIAGNOSIS — F40240 Claustrophobia: Secondary | ICD-10-CM | POA: Diagnosis present

## 2018-02-25 DIAGNOSIS — R2681 Unsteadiness on feet: Secondary | ICD-10-CM | POA: Diagnosis not present

## 2018-02-25 DIAGNOSIS — S199XXA Unspecified injury of neck, initial encounter: Secondary | ICD-10-CM | POA: Diagnosis not present

## 2018-02-25 DIAGNOSIS — M6281 Muscle weakness (generalized): Secondary | ICD-10-CM | POA: Diagnosis not present

## 2018-02-25 DIAGNOSIS — M25572 Pain in left ankle and joints of left foot: Secondary | ICD-10-CM | POA: Diagnosis not present

## 2018-02-25 DIAGNOSIS — I480 Paroxysmal atrial fibrillation: Secondary | ICD-10-CM | POA: Diagnosis present

## 2018-02-25 DIAGNOSIS — I361 Nonrheumatic tricuspid (valve) insufficiency: Secondary | ICD-10-CM | POA: Diagnosis not present

## 2018-02-25 DIAGNOSIS — Z743 Need for continuous supervision: Secondary | ICD-10-CM | POA: Diagnosis not present

## 2018-02-25 DIAGNOSIS — S99912A Unspecified injury of left ankle, initial encounter: Secondary | ICD-10-CM | POA: Diagnosis not present

## 2018-02-25 DIAGNOSIS — M479 Spondylosis, unspecified: Secondary | ICD-10-CM | POA: Diagnosis present

## 2018-02-25 DIAGNOSIS — I10 Essential (primary) hypertension: Secondary | ICD-10-CM

## 2018-02-25 DIAGNOSIS — M1611 Unilateral primary osteoarthritis, right hip: Secondary | ICD-10-CM | POA: Diagnosis present

## 2018-02-25 DIAGNOSIS — Z950 Presence of cardiac pacemaker: Secondary | ICD-10-CM

## 2018-02-25 DIAGNOSIS — F4310 Post-traumatic stress disorder, unspecified: Secondary | ICD-10-CM

## 2018-02-25 DIAGNOSIS — R112 Nausea with vomiting, unspecified: Secondary | ICD-10-CM | POA: Diagnosis present

## 2018-02-25 DIAGNOSIS — F329 Major depressive disorder, single episode, unspecified: Secondary | ICD-10-CM | POA: Diagnosis present

## 2018-02-25 DIAGNOSIS — G9340 Encephalopathy, unspecified: Secondary | ICD-10-CM | POA: Diagnosis not present

## 2018-02-25 DIAGNOSIS — R279 Unspecified lack of coordination: Secondary | ICD-10-CM | POA: Diagnosis not present

## 2018-02-25 DIAGNOSIS — R2689 Other abnormalities of gait and mobility: Secondary | ICD-10-CM | POA: Diagnosis not present

## 2018-02-25 DIAGNOSIS — I160 Hypertensive urgency: Secondary | ICD-10-CM | POA: Diagnosis present

## 2018-02-25 DIAGNOSIS — M545 Low back pain: Secondary | ICD-10-CM | POA: Diagnosis present

## 2018-02-25 DIAGNOSIS — R278 Other lack of coordination: Secondary | ICD-10-CM | POA: Diagnosis not present

## 2018-02-25 DIAGNOSIS — G8929 Other chronic pain: Secondary | ICD-10-CM | POA: Diagnosis not present

## 2018-02-25 DIAGNOSIS — R109 Unspecified abdominal pain: Secondary | ICD-10-CM | POA: Diagnosis not present

## 2018-02-25 DIAGNOSIS — R41841 Cognitive communication deficit: Secondary | ICD-10-CM | POA: Diagnosis not present

## 2018-02-25 DIAGNOSIS — N400 Enlarged prostate without lower urinary tract symptoms: Secondary | ICD-10-CM | POA: Diagnosis present

## 2018-02-25 DIAGNOSIS — K5090 Crohn's disease, unspecified, without complications: Secondary | ICD-10-CM | POA: Diagnosis present

## 2018-02-25 DIAGNOSIS — S0990XA Unspecified injury of head, initial encounter: Secondary | ICD-10-CM | POA: Diagnosis not present

## 2018-02-25 DIAGNOSIS — K219 Gastro-esophageal reflux disease without esophagitis: Secondary | ICD-10-CM | POA: Diagnosis present

## 2018-02-25 DIAGNOSIS — R1013 Epigastric pain: Secondary | ICD-10-CM | POA: Diagnosis present

## 2018-02-25 DIAGNOSIS — G4730 Sleep apnea, unspecified: Secondary | ICD-10-CM | POA: Diagnosis present

## 2018-02-25 LAB — CBC
HCT: 37.5 % — ABNORMAL LOW (ref 39.0–52.0)
Hemoglobin: 12.7 g/dL — ABNORMAL LOW (ref 13.0–17.0)
MCH: 30.7 pg (ref 26.0–34.0)
MCHC: 33.9 g/dL (ref 30.0–36.0)
MCV: 90.6 fL (ref 80.0–100.0)
Platelets: 210 10*3/uL (ref 150–400)
RBC: 4.14 MIL/uL — ABNORMAL LOW (ref 4.22–5.81)
RDW: 14.4 % (ref 11.5–15.5)
WBC: 10.2 10*3/uL (ref 4.0–10.5)
nRBC: 0 % (ref 0.0–0.2)

## 2018-02-25 LAB — COMPREHENSIVE METABOLIC PANEL
ALK PHOS: 57 U/L (ref 38–126)
ALT: 16 U/L (ref 0–44)
AST: 24 U/L (ref 15–41)
Albumin: 3.7 g/dL (ref 3.5–5.0)
Anion gap: 8 (ref 5–15)
BILIRUBIN TOTAL: 1.3 mg/dL — AB (ref 0.3–1.2)
BUN: 11 mg/dL (ref 8–23)
CO2: 24 mmol/L (ref 22–32)
Calcium: 8.9 mg/dL (ref 8.9–10.3)
Chloride: 104 mmol/L (ref 98–111)
Creatinine, Ser: 0.94 mg/dL (ref 0.61–1.24)
GFR calc Af Amer: >60 mL/min (ref >60)
GFR calc non Af Amer: >60 mL/min (ref >60)
Glucose, Bld: 100 mg/dL — ABNORMAL HIGH (ref 70–99)
Potassium: 4.9 mmol/L (ref 3.5–5.1)
Sodium: 136 mmol/L (ref 135–145)
TOTAL PROTEIN: 6.3 g/dL — AB (ref 6.5–8.1)

## 2018-02-25 LAB — ECHOCARDIOGRAM COMPLETE
Height: 69 in
Weight: 2959.46 oz

## 2018-02-25 LAB — TROPONIN I
Troponin I: <0.03 ng/mL (ref <0.03)
Troponin I: <0.03 ng/mL (ref <0.03)

## 2018-02-25 MED ORDER — PANTOPRAZOLE SODIUM 40 MG PO TBEC
40.0000 mg | DELAYED_RELEASE_TABLET | Freq: Every day | ORAL | Status: DC
  Administered 2018-02-25 – 2018-02-27 (×3): 40 mg via ORAL
  Filled 2018-02-25 (×4): qty 1

## 2018-02-25 MED ORDER — SODIUM CHLORIDE 0.9% FLUSH: 10.0000 mL | INTRAVENOUS | Status: DC | PRN

## 2018-02-25 MED ORDER — PERFLUTREN LIPID MICROSPHERE
1.0000 mL | INTRAVENOUS | Status: AC | PRN
  Administered 2018-02-25: 4.5 mL via INTRAVENOUS
  Filled 2018-02-25: qty 10

## 2018-02-25 NOTE — Evaluation (Addendum)
Physical Therapy Evaluation Patient Details Name: Kevin Wiley MRN: [id number] DOB: 1941/01/23 Today's Date: 02/25/2018   History of Present Illness  Kevin Wiley is a 78 y.o. male with Past medical history of anxiety, PTSD, depression, CAD S/P PCI, BPH, GERD, HTN, pacemaker implant, PAF.  Presents with Abdominal pain and HTN urgency.    Clinical Impression  Pt admitted with above diagnosis. Pt currently with functional limitations due to the deficits listed below (see PT Problem List). Pt was able to pivot to chair with min to mod assist due to instability and weakness.  Pt states he and his wife function marginally in the home.  Will need SNF to gain strength and endurance.   Pt will benefit from skilled PT to increase their independence and safety with mobility to allow discharge to the venue listed below.      Follow Up Recommendations SNF;Supervision/Assistance - 24 hour, HH services if pt declines SNF or is declined by SNF.  HHPT,HHOT, HHRN, HHAide and [place]    Equipment Recommendations  None recommended by PT    Recommendations for Other Services       Precautions / Restrictions Precautions Precautions: Fall Restrictions Weight Bearing Restrictions: No      Mobility  Bed Mobility Overal bed mobility: Needs Assistance Bed Mobility: Supine to Sit     Supine to sit: Mod assist     General bed mobility comments: Pt needed mod assist to come to EOB for elevation of trunk.  Could not sit up without mod assist as he kept falling posteriorly.   Transfers Overall transfer level: Needs assistance Equipment used: Rolling walker (2 wheeled) Transfers: Sit to/from [facility] Sit to Stand: Min assist;From elevated surface Stand pivot transfers: Min assist       General transfer comment: Pt needed min assist to stand.  Cleaned pt as NT had set up for bath therefore PT washed buttocks and back while pt standing.  Once pt in chair, he finished his bath with min  assist by this PT and PT assisted him with changing his gown.    Ambulation/Gait                Stairs            Wheelchair Mobility    Modified Rankin (Stroke Patients Only)       Balance Overall balance assessment: Needs assistance Sitting-balance support: Feet supported;Bilateral upper extremity supported Sitting balance-Leahy Scale: Poor Sitting balance - Comments: could not sit EOB without mod assist at times due to posterior lean Postural control: Posterior lean Standing balance support: Bilateral upper extremity supported;During functional activity Standing balance-Leahy Scale: Poor Standing balance comment: relies on UE support and external support with RW                              Pertinent Vitals/Pain Pain Assessment: No/denies pain    Home Living Family/patient expects to be discharged to:: Private residence Living Arrangements: Spouse/significant other Available Help at Discharge: Family(wife is disabled) Type of Home: House Home Access: Stairs to enter Entrance Stairs-Rails: None Entrance Stairs-Number of Steps: 1 Home Layout: Multi-level Home Equipment: Cane - single point;Walker - 2 wheels;Walker - 4 wheels      Prior Function Level of Independence: Needs assistance   Gait / Transfers Assistance Needed: Pt states he walks very little at home but when he does he uses rollator  ADL's / Homemaking Assistance Needed: states  he sponge bathes  Comments: Pt states son works.  He states that he drives but they go out very little.  HE states noone cooks that they order take out every meal.  He states that wife uses RW.  He states they function marginally and need more help at home.      Hand Dominance        Extremity/Trunk Assessment   Upper Extremity Assessment Upper Extremity Assessment: Defer to OT evaluation    Lower Extremity Assessment Lower Extremity Assessment: Generalized weakness    Cervical / Trunk  Assessment Cervical / Trunk Assessment: Normal  Communication   Communication: HOH  Cognition Arousal/Alertness: Awake/alert Behavior During Therapy: WFL for tasks assessed/performed Overall Cognitive Status: Within Functional Limits for tasks assessed                                        General Comments      Exercises     Assessment/Plan    PT Assessment Patient needs continued PT services  PT Problem List Decreased activity tolerance;Decreased balance;Decreased mobility;Decreased safety awareness;Decreased knowledge of use of DME;Decreased knowledge of precautions;Cardiopulmonary status limiting activity       PT Treatment Interventions DME instruction;Gait training;Stair training;Therapeutic activities;Functional mobility training;Therapeutic exercise;Balance training;Patient/family education    PT Goals (Current goals can be found in the Care Plan section)  Acute Rehab PT Goals Patient Stated Goal: to go to therapy PT Goal Formulation: With patient Time For Goal Achievement: 03/11/18 Potential to Achieve Goals: Good    Frequency Min 3X/week   Barriers to discharge Decreased caregiver support(wife is disabled)      Co-evaluation               AM-PAC PT "6 Clicks" Mobility  Outcome Measure Help needed turning from your back to your side while in a flat bed without using bedrails?: A Lot Help needed moving from lying on your back to sitting on the side of a flat bed without using bedrails?: A Lot Help needed moving to and from a bed to a chair (including a wheelchair)?: A Lot Help needed standing up from a chair using your arms (e.g., wheelchair or bedside chair)?: A Lot Help needed to walk in hospital room?: Total Help needed climbing 3-5 steps with a railing? : Total 6 Click Score: 10    End of Session Equipment Utilized During Treatment: Gait belt Activity Tolerance: Patient limited by fatigue Patient left: in chair;with call  bell/phone within reach;with chair alarm set Nurse Communication: Mobility status PT Visit Diagnosis: Unsteadiness on feet (R26.81);Muscle weakness (generalized) (M62.81)    Time: 9476-5465 PT Time Calculation (min) (ACUTE ONLY): 22 min   Charges:   PT Evaluation $PT Eval Moderate Complexity: [place] Pager:  [phone]  Office:  [phone]    Denice Paradise 02/25/2018, 11:13 AM

## 2018-02-25 NOTE — Care Management Obs Status (Signed)
[place] NOTIFICATION   Patient Details  Name: GRANTLEY SAVAGE MRN: [id number] Date of Birth: 1940-11-09   Medicare Observation Status Notification Given:  Yes(NCM reviewed MOON, pt declined to sign. States he does not understand form. Read and reviewed. Declined to sign. )    Erenest Rasher, RN 02/25/2018, 1:10 PM

## 2018-02-25 NOTE — Progress Notes (Signed)
  Echocardiogram 2D Echocardiogram has been performed.  Kevin Wiley 02/25/2018, 2:28 PM

## 2018-02-25 NOTE — Evaluation (Signed)
Occupational Therapy Evaluation Patient Details Name: Kevin Wiley MRN: [id number] DOB: 06/25/1940 Today's Date: 02/25/2018    History of Present Illness Kevin Wiley is a 78 y.o. male with Past medical history of anxiety, PTSD, depression, CAD S/P PCI, BPH, GERD, HTN, pacemaker implant, PAF.  Presents with Abdominal pain and HTN urgency.     Clinical Impression   Pt with decline in function and safety with ADLs and ADL mobility with decreased strength, balance an endurance. PTA, pt lived at home with his wife who he reports is disabled but that he cannot assist her very much. Currently pt requires assist with selfcare and mobility. Pt would benefit from acute OT services to address impairments to maximize level of function and safety    Follow Up Recommendations  SNF [facility] services if pt declines SNF or is declined by SNF.  HHPT,HHOT, [place], HHAide and HHSW)    Equipment Recommendations  Other (comment)(TBD at next venue of care)    Recommendations for Other Services       Precautions / Restrictions Precautions Precautions: Fall Restrictions Weight Bearing Restrictions: No      Mobility Bed Mobility Overal bed mobility: Needs Assistance Bed Mobility: Supine to Sit     Supine to sit: Min assist     General bed mobility comments: pt required assist to elevate trunk and leaning posteriorly  Transfers Overall transfer level: Needs assistance Equipment used: Rolling walker (2 wheeled) Transfers: Sit to/from [facility] Sit to Stand: Min assist;From elevated surface Stand pivot transfers: Min assist       General transfer comment: Pt needed min assist to stand.  Cleaned pt as NT had set up for bath therefore PT washed buttocks and back while pt standing.  Once pt in chair, he finished his bath with min assist by this PT and PT assisted him with changing his gown.      Balance Overall balance assessment: Needs assistance Sitting-balance support: Feet  supported;Bilateral upper extremity supported Sitting balance-Leahy Scale: Poor Sitting balance - Comments: could not sit EOB without mod assist at times due to posterior lean Postural control: Posterior lean Standing balance support: Bilateral upper extremity supported;During functional activity Standing balance-Leahy Scale: Poor Standing balance comment: relies on UE support and external support with RW                            ADL either performed or assessed with clinical judgement   ADL Overall ADL's : Needs assistance/impaired Eating/Feeding: Independent   Grooming: Wash/dry hands;Wash/dry face;Minimal assistance;Sitting Grooming Details (indicate cue type and reason): Poor sitting balance, pt leaning posteriorly requring mod a for balance/support Upper Body Bathing: Minimal assistance;Sitting Upper Body Bathing Details (indicate cue type and reason): Poor sitting balance, pt leaning posteriorly requring mod a for balance/support Lower Body Bathing: Maximal assistance   Upper Body Dressing : Minimal assistance Upper Body Dressing Details (indicate cue type and reason): Poor sitting balance, pt leaning posteriorly requring mod a for balance/support Lower Body Dressing: Total assistance   Toilet Transfer: Minimal assistance;Ambulation;Stand-pivot;RW;BSC;Cueing for safety   Toileting- Clothing Manipulation and Hygiene: Sit to/from stand;Moderate assistance       Functional mobility during ADLs: Minimal assistance;Cueing for safety       Vision Patient Visual Report: No change from baseline       Perception     Praxis      Pertinent Vitals/Pain Pain Assessment: No/denies pain     Hand Dominance  Right   Extremity/Trunk Assessment Upper Extremity Assessment Upper Extremity Assessment: Generalized weakness   Lower Extremity Assessment Lower Extremity Assessment: Defer to PT evaluation   Cervical / Trunk Assessment Cervical / Trunk Assessment:  Normal   Communication Communication Communication: HOH   Cognition Arousal/Alertness: Awake/alert Behavior During Therapy: WFL for tasks assessed/performed Overall Cognitive Status: Within Functional Limits for tasks assessed                                     General Comments       Exercises     Shoulder Instructions      Home Living Family/patient expects to be discharged to:: Private residence Living Arrangements: Spouse/significant other Available Help at Discharge: Family Type of Home: House Home Access: Stairs to enter Technical brewer of Steps: 1 Entrance Stairs-Rails: None Home Layout: Multi-level Alternate Level Stairs-Number of Steps: 15 Alternate Level Stairs-Rails: Right;Left Bathroom Shower/Tub: Tub/shower unit;Walk-in shower   Bathroom Toilet: Standard     Home Equipment: Cane - single point;Walker - 2 wheels;Walker - 4 wheels          Prior Functioning/Environment Level of Independence: Needs assistance  Gait / Transfers Assistance Needed: Pt states he walks very little at home but when he does he uses rollator ADL's / Homemaking Assistance Needed: states he sponge bathes   Comments: Pt states son works.  He states that he drives but they go out very little.  Pt states that they order take out every meal.  He states that wife uses RW.  He states they function marginally and need more help at home.         OT Problem List: Decreased strength;Decreased activity tolerance;Decreased knowledge of use of DME or AE;Decreased safety awareness;Impaired balance (sitting and/or standing);Decreased coordination;Decreased knowledge of precautions      OT Treatment/Interventions: Self-care/ADL training;DME and/or AE instruction;Therapeutic activities;Patient/family education    OT Goals(Current goals can be found in the care plan section) Acute Rehab OT Goals Patient Stated Goal: go home OT Goal Formulation: With patient Time For Goal  Achievement: 03/11/18 Potential to Achieve Goals: Good ADL Goals Pt Will Perform Grooming: with min guard assist;sitting Pt Will Perform Upper Body Bathing: with min guard assist;sitting Pt Will Perform Lower Body Bathing: with mod assist;sitting/lateral leans;sit to/from stand Pt Will Perform Upper Body Dressing: with min guard assist;sitting Pt Will Transfer to Toilet: with min guard assist;ambulating;regular height toilet;bedside commode;grab bars Pt Will Perform Toileting - Clothing Manipulation and hygiene: with min assist;sit to/from stand  OT Frequency: Min 2X/week   Barriers to D/C: Decreased caregiver support  pt reports that his wife is disabled       Co-evaluation              AM-PAC OT "6 Clicks" Daily Activity     Outcome Measure Help from another person eating meals?: None Help from another person taking care of personal grooming?: A Little Help from another person toileting, which includes using toliet, bedpan, or urinal?: A Lot Help from another person bathing (including washing, rinsing, drying)?: A Lot Help from another person to put on and taking off regular upper body clothing?: A Little Help from another person to put on and taking off regular lower body clothing?: Total 6 Click Score: 15   End of Session Equipment Utilized During Treatment: Gait belt;Rolling walker;Other (comment)(BSC)  Activity Tolerance: Patient tolerated treatment well Patient left: in chair;with chair alarm  set;with call bell/phone within reach  OT Visit Diagnosis: Unsteadiness on feet (R26.81);Other abnormalities of gait and mobility (R26.89);Pain;Muscle weakness (generalized) (M62.81)                Time: 1771-1657 OT Time Calculation (min): 26 min Charges:  OT General Charges $OT Visit: 1 Visit OT Evaluation $OT Eval Moderate Complexity: 1 Mod OT Treatments $Self Care/Home Management : 8-22 mins    Britt Bottom 02/25/2018, 1:29 PM

## 2018-02-25 NOTE — Progress Notes (Signed)
PROGRESS NOTE  Kevin Wiley:[id number] DOB: 10-04-1940 DOA: 02/24/2018 PCP: Burnard Bunting, MD   LOS: 0 days   Brief Narrative / Interim history: Kevin Wiley is a 78 y.o. male with Past medical history of anxiety, PTSD, depression, CAD S/P PCI, BPH, GERD, HTN, pacemaker implant, PAF. Patient presented with complaints of abdominal pain.  Was found to have hypertensive urgency and was admitted to the hospital for blood pressure control.  No abdominal pain this morning, has a mild headache.  He is a poor historian  Subjective: Complains of a headache, denies any abdominal pain, no nausea or vomiting.  No chest pain.  Assessment & Plan: Principal Problem:   Hypertensive urgency Active Problems:   Hyperlipidemia, mixed   PTSD (post-traumatic stress disorder)   CAD (coronary artery disease)   Pacemaker dual chamber [employer] 2007, new generator 06/11/13   Crohn disease ([place])   Bilateral renal artery stenosis (HCC)   Fatigue   Polyneuropathy   Paroxysmal atrial tachycardia (HCC)   B12 deficiency   Principal Problem Hypertensive urgency -Abnormal ECG. Patient presents with complaints of epigastric abdominal pain with nausea and vomiting. EKG shows diffuse T wave inversions.  Chest pain has resolved, cardiac enzymes have remained negative. -CT chest on admission with dilatation of thoracic aorta with aneurysm 4.7 cm without dissection, no PE, triple-vessel CAD without any acute abnormality in the lungs. Also seen infrarenal abdominal aortic aneurysm stable 3.3 cm no dissection. Prior history of bilateral renal artery stenosis with stent patent.   Active Problems Acute encephalopathy -Metabolic in nature from medication as well as hypertension. -CT head unremarkable. -unable to perform MRI due to pacemaker. -Improved but still mildly confused this morning  Chronic polyneuropathy -Continue gabapentin  Anxiety, PTSD -continuing home regimen.  GERD -Continuing  PPI  Sick sinus syndrome with pacemaker in place /paroxysmal atrial tachycardia -Monitor, no apparent issues.  Seeing cardiology on a regular basis as an outpatient    Scheduled Meds: . atenolol  100 mg Oral Daily  . atorvastatin  80 mg Oral q1800  . busPIRone  5 mg Oral BID  . cloNIDine  0.1 mg Transdermal Weekly  . enoxaparin (LOVENOX) injection  40 mg Subcutaneous Q24H  . gabapentin  800 mg Oral TID  . hydrALAZINE  50 mg Oral TID  . pantoprazole  40 mg Oral QHS  . sodium chloride flush  3 mL Intravenous Q12H  . tamsulosin  0.4 mg Oral QPC supper  . traZODone  200 mg Oral QHS  . venlafaxine XR  225 mg Oral Q breakfast   Continuous Infusions: PRN Meds:.acetaminophen **OR** acetaminophen, alum & mag hydroxide-simeth, hydrALAZINE, HYDROcodone-acetaminophen, nitroGLYCERIN, ondansetron **OR** ondansetron (ZOFRAN) IV, zolpidem  DVT prophylaxis: Lovenox Code Status: Full code Family Communication: no family at bedside  Disposition Plan: SNF  Consultants:   None   Procedures:   2D echo: pending  Antimicrobials:  None    Objective: Vitals:   02/25/18 0634 02/25/18 0753 02/25/18 0756 02/25/18 1100  BP: (!) 142/76 (!) 148/82 (!) 141/70 110/69  Pulse:  66  (!) 59  Resp:  18 12 17   Temp:  98.4 F (36.9 C)  98.3 F (36.8 C)  TempSrc:  Oral  Oral  SpO2:  99%  95%  Weight:      Height:        Intake/Output Summary (Last 24 hours) at 02/25/2018 1202 Last data filed at 02/25/2018 0900 Gross per 24 hour  Intake 240 ml  Output 250 ml  Net -10  ml   Filed Weights   02/24/18 1108 02/25/18 0444  Weight: 83.9 kg 83.9 kg    Examination:  Constitutional: NAD Eyes: PERRL, lids and conjunctivae normal ENMT: Mucous membranes are moist. Respiratory: clear to auscultation bilaterally, no wheezing, no crackles. Normal respiratory effort. No accessory muscle use.  Cardiovascular: Regular rate and rhythm. No LE edema. 2+ pedal pulses. No carotid bruits.  Abdomen: no  tenderness. Bowel sounds positive.  Musculoskeletal: no clubbing / cyanosis. Skin: no rashes, lesions, ulcers. No induration Neurologic: non focal    Data Reviewed: I have independently reviewed following labs and imaging studies   CBC: Recent Labs  Lab 02/24/18 1121 02/25/18 0956  WBC 8.8 10.2  NEUTROABS 6.9  --   HGB 13.3 12.7*  HCT 40.6 37.5*  MCV 91.2 90.6  PLT 198 660   Basic Metabolic Panel: Recent Labs  Lab 02/24/18 1121 02/24/18 1148 02/25/18 0725  NA 137  --  136  K 3.9  --  4.9  CL 99  --  104  CO2 27  --  24  GLUCOSE 119*  --  100*  BUN 8  --  11  CREATININE 0.92 0.70 0.94  CALCIUM 8.9  --  8.9   GFR: Estimated Creatinine Clearance: 66.9 mL/min (by C-G formula based on SCr of 0.94 mg/dL). Liver Function Tests: Recent Labs  Lab 02/24/18 1121 02/25/18 0725  AST 20 24  ALT 22 16  ALKPHOS 65 57  BILITOT 0.6 1.3*  PROT 6.8 6.3*  ALBUMIN 3.8 3.7   Recent Labs  Lab 02/24/18 1121  LIPASE 23   No results for input(s): AMMONIA in the last 168 hours. Coagulation Profile: No results for input(s): INR, PROTIME in the last 168 hours. Cardiac Enzymes: Recent Labs  Lab 02/24/18 2216 02/25/18 0725  TROPONINI <0.03 <0.03   BNP (last 3 results) No results for input(s): PROBNP in the last 8760 hours. HbA1C: No results for input(s): HGBA1C in the last 72 hours. CBG: No results for input(s): GLUCAP in the last 168 hours. Lipid Profile: No results for input(s): CHOL, HDL, LDLCALC, TRIG, CHOLHDL, LDLDIRECT in the last 72 hours. Thyroid Function Tests: No results for input(s): TSH, T4TOTAL, FREET4, T3FREE, THYROIDAB in the last 72 hours. Anemia Panel: No results for input(s): VITAMINB12, FOLATE, FERRITIN, TIBC, IRON, RETICCTPCT in the last 72 hours. Urine analysis:    Component Value Date/Time   COLORURINE COLORLESS (A) 02/24/2018 1539   APPEARANCEUR CLEAR 02/24/2018 1539   LABSPEC 1.025 02/24/2018 1539   PHURINE 8.0 02/24/2018 1539   GLUCOSEU  NEGATIVE 02/24/2018 1539   HGBUR NEGATIVE 02/24/2018 1539   BILIRUBINUR NEGATIVE 02/24/2018 1539   KETONESUR NEGATIVE 02/24/2018 1539   PROTEINUR NEGATIVE 02/24/2018 1539   UROBILINOGEN 0.2 05/03/2013 1327   NITRITE NEGATIVE 02/24/2018 1539   LEUKOCYTESUR NEGATIVE 02/24/2018 1539   Sepsis Labs: Invalid input(s): PROCALCITONIN, LACTICIDVEN  No results found for this or any previous visit (from the past 240 hour(s)).    Radiology Studies: Ct Head Wo Contrast  Result Date: 02/24/2018 CLINICAL DATA:  Headache EXAM: CT HEAD WITHOUT CONTRAST TECHNIQUE: Contiguous axial images were obtained from the base of the skull through the vertex without intravenous contrast. COMPARISON:  09/11/2015 FINDINGS: Brain: There is no mass, hemorrhage or extra-axial collection. There is generalized atrophy without lobar predilection. Hypodensity of the white matter is most commonly associated with chronic microvascular disease. Vascular: Atherosclerotic calcification of the internal carotid arteries at the skull base. No abnormal hyperdensity of the major intracranial arteries or  dural venous sinuses. Skull: The visualized skull base, calvarium and extracranial soft tissues are normal. Sinuses/Orbits: No fluid levels or advanced mucosal thickening of the visualized paranasal sinuses. No mastoid or middle ear effusion. The orbits are normal. IMPRESSION: 1. No acute intracranial abnormality. 2. Atrophy and chronic microvascular ischemia. Electronically Signed   By: Ulyses Jarred M.D.   On: 02/24/2018 17:24   Ct Angio Chest/abd/pel For Dissection W And/or Wo Contrast  Result Date: 02/24/2018 CLINICAL DATA:  Back pain and constipation. Known abdominal aortic aneurysm. EXAM: CT ANGIOGRAPHY CHEST, ABDOMEN AND PELVIS TECHNIQUE: Multidetector CT imaging through the chest, abdomen and pelvis was performed using the standard protocol during bolus administration of intravenous contrast. Multiplanar reconstructed images and MIPs  were obtained and reviewed to evaluate the vascular anatomy. CONTRAST:  134m ISOVUE-370 IOPAMIDOL (ISOVUE-370) INJECTION 76% COMPARISON:  08/19/2016 CT AP, CXR 02/15/2017 and 07/04/2015 FINDINGS: CTA CHEST FINDINGS Cardiovascular: Unenhanced images of the chest demonstrate aortic atherosclerosis without mural hematoma. After IV contrast, no aortic dissection is identified. Aneurysmal dilatation of the ascending thoracic aorta to 4.7 cm is identified at the level of the main pulmonary artery. Satisfactory opacification of the pulmonary arteries to the segmental level. No acute pulmonary embolus. Heart size is top normal without pericardial effusion or thickening. Left main, LAD and RCA coronary arteriosclerosis is noted. Pacemaker apparatus projects over the left upper anterior chest wall with leads in the right atrium and right ventricle. Mediastinum/Nodes: No axillary, supraclavicular, mediastinal nor hilar lymphadenopathy. Retrosternal extension of the thyroid gland without dominant mass is noted. The CT appearance of the thoracic esophagus is unremarkable. The trachea is midline and mainstem bronchi appear patent. No intraluminal abnormalities are identified within. Lungs/Pleura: Lungs are clear. No pleural effusion or pneumothorax. Musculoskeletal: ACDF of the included lower cervical spine. Chronic mild-to-moderate anterior compression fracture of T6 without retropulsion. Mild thoracic spondylosis. Osteoarthritis of the included glenohumeral joint on the right. No acute nor suspicious osseous abnormalities. Review of the MIP images confirms the above findings. CTA ABDOMEN AND PELVIS FINDINGS VASCULAR Aorta: Infrarenal 3.3 cm fusiform abdominal aortic aneurysm with soft and hard plaque along the anterior and lateral walls bilaterally. No dissection is identified. Celiac: Patent without evidence of aneurysm, dissection, vasculitis or significant stenosis. Atherosclerosis at the origin without significant  stenosis. SMA: Patent without evidence of aneurysm, dissection, vasculitis or significant stenosis. Renals: Patent bilateral renal artery stents are noted without significant stenosis. No aneurysm, dissection or evidence of vasculitis. No conclusive findings for fibromuscular dysplasia. IMA: Patent Inflow: Patent without evidence of aneurysm, dissection, vasculitis or significant stenosis. Veins: No obvious venous abnormality within the limitations of this arterial phase study. Review of the MIP images confirms the above findings. NON-VASCULAR Hepatobiliary: Subtle nodularity of the liver surface compatible with morphologic changes of cirrhosis. No space-occupying mass. The gallbladder is physiologically distended without stones. Pancreas: Normal Spleen: Normal Adrenals/Urinary Tract: Normal bilateral adrenal glands. Chronic renal cortical scarring and atrophy of the right kidney with bilateral low attenuating lesions compatible with renal cysts, some of which are too small to further characterize. Redemonstration of a 12 mm indeterminate interpolar lesion off the right kidney without significant change in size and attenuation. On prior CT, findings were consistent with a Bosniak category 2 cyst, possibly hemorrhagic. No significant interval progression in size or development of new change. No obstructive uropathy. The urinary bladder is unremarkable for the degree of distention. Stomach/Bowel: Normal appendix is identified. The stomach, duodenal sweep, ligament of Treitz and small intestine are unremarkable. The distal  and terminal ileum are within normal limits. Average stool retention is seen within the colon with scattered colonic diverticulosis more so along the sigmoid colon. No acute diverticulitis. Lymphatic: No pelvic sidewall, inguinal, mesenteric, or retroperitoneal adenopathy. Small porta hepatic lymph node measuring 8 mm short axis is noted. Reproductive: Enlarged prostate measuring approximately 6.2 x  5.4 x 56.4 cm impressing upon the base of the bladder is again demonstrated. Unremarkable seminal vesicles. Other: No free air nor free fluid. Musculoskeletal: Lumbar spondylosis with degenerative disc disease L2 through L5. Moderate broad-based disc bulge at L3-4 contributing to mild canal stenosis. Mild disc bulge at L4-5 impressing flattening the ventral aspect of the thecal sac. No aggressive osseous lesions. Review of the MIP images confirms the above findings. IMPRESSION: Chest CT: 1. Aneurysmal dilatation of the ascending thoracic aorta to 4.7 cm without dissection. 2. No acute pulmonary embolus. 3. Left main, LAD and RCA coronary arteriosclerosis. 4. No active pulmonary disease. Vascular: 1. Infrarenal fusiform abdominal aortic aneurysm unchanged in appearance measuring up to 3.3 cm. No dissection noted. 2. Bilateral renal artery stents, patent without significant stenosis. CT AP: 1. Stable Bosniak category 2 lesion off the interpolar aspect of the right kidney measuring 12 mm. No significant interval progression in size or change. Additional more simple appearing cysts are identified bilaterally. 2. Prostatomegaly 3. Subtle surface nodularity of the liver suggesting morphologic changes of cirrhosis without space-occupying mass. No ascites. 4. Broad-based disc bulge L3-4 contributing to mild central canal stenosis. Mild disc bulge at L4-5. 5. No acute nor aggressive osseous lesions. 6. Colonic diverticulosis without acute diverticulitis. Electronically Signed   By: Ashley Royalty M.D.   On: 02/24/2018 14:06    Marzetta Board, MD, PhD [facility]  Contact via  [email or web address]  [place] P: [phone]  F: [phone]

## 2018-02-26 ENCOUNTER — Inpatient Hospital Stay (HOSPITAL_COMMUNITY): Payer: Medicare Other

## 2018-02-26 DIAGNOSIS — R109 Unspecified abdominal pain: Secondary | ICD-10-CM

## 2018-02-26 DIAGNOSIS — E782 Mixed hyperlipidemia: Secondary | ICD-10-CM

## 2018-02-26 NOTE — Progress Notes (Signed)
Was the fall witnessed: no  Patient condition before and after the fall: stable  Patient's reaction to the fall: Pt stated he was fine    Name of the doctor that was notified including date and time: Chaney Malling, NP- 02/25/2018 2359    Any interventions and vital signs: BP- 135/65, Pulse 64, RR 14, SpO2 99%, Temp 98.19F

## 2018-02-26 NOTE — Progress Notes (Signed)
PROGRESS NOTE  Kevin Wiley FMB:[id number] DOB: September 25, 1940 DOA: 02/24/2018 PCP: Burnard Bunting, MD   LOS: 1 day   Brief Narrative / Interim history: Kevin Wiley is a 78 y.o. male with Past medical history of anxiety, PTSD, depression, CAD S/P PCI, BPH, GERD, HTN, pacemaker implant, PAF. Patient presented with complaints of abdominal pain.  Was found to have hypertensive urgency and was admitted to the hospital for blood pressure control.  No abdominal pain this morning, has a mild headache.  He is a poor historian  Subjective: -Headache improved, no chest pain, no shortness of breath, no abdominal pain, nausea or vomiting  Assessment & Plan: Principal Problem:   Hypertensive urgency Active Problems:   Hyperlipidemia, mixed   PTSD (post-traumatic stress disorder)   CAD (coronary artery disease)   Pacemaker dual chamber Med[employer]onic 2007, new generator 06/11/13   Crohn disease ([place])   Bilateral renal artery stenosis (HCC)   Fatigue   Polyneuropathy   Paroxysmal atrial tachycardia (HCC)   B12 deficiency   Principal Problem Hypertensive urgency -Abnormal ECG. Patient presents with complaints of epigastric abdominal pain with nausea and vomiting. EKG shows diffuse T wave inversions.  Chest pain has resolved, cardiac enzymes have remained negative. -CT chest on admission with dilatation of thoracic aorta with aneurysm 4.7 cm without dissection, no PE, triple-vessel CAD without any acute abnormality in the lungs. Also seen infrarenal abdominal aortic aneurysm stable 3.3 cm no dissection. Prior history of bilateral renal artery stenosis with stent patent. -Blood pressure much improved, currently he is on atenolol 100 g daily, clonidine 0.1 mg weekly patch, hydralazine 50 mg 3 times daily.  Keep on same regimen  Active Problems Acute encephalopathy -Metabolic in nature from medication as well as hypertension. -CT head unremarkable. -unable to perform MRI due to pacemaker. -Improved,  this morning is alert to place as well as time which she was not yesterday.  He still overall feeling very weak, physical therapy recommended SNF, discussed with Education officer, museum.  He is agreeable.  Chronic polyneuropathy -Continue gabapentin  Anxiety, PTSD -continuing home regimen.  GERD -Continuing PPI  Sick sinus syndrome with pacemaker in place /paroxysmal atrial tachycardia -No issues, followed by cardiology as an outpatient   Scheduled Meds: . atenolol  100 mg Oral Daily  . atorvastatin  80 mg Oral q1800  . busPIRone  5 mg Oral BID  . cloNIDine  0.1 mg Transdermal Weekly  . enoxaparin (LOVENOX) injection  40 mg Subcutaneous Q24H  . gabapentin  800 mg Oral TID  . hydrALAZINE  50 mg Oral TID  . pantoprazole  40 mg Oral QHS  . sodium chloride flush  3 mL Intravenous Q12H  . tamsulosin  0.4 mg Oral QPC supper  . traZODone  200 mg Oral QHS  . venlafaxine XR  225 mg Oral Q breakfast   Continuous Infusions: PRN Meds:.acetaminophen **OR** acetaminophen, alum & mag hydroxide-simeth, hydrALAZINE, HYDROcodone-acetaminophen, nitroGLYCERIN, ondansetron **OR** ondansetron (ZOFRAN) IV, sodium chloride flush, zolpidem  DVT prophylaxis: Lovenox Code Status: Full code Family Communication: no family at bedside  Disposition Plan: SNF  Consultants:   None   Procedures:   2D echo:  Study Conclusions - Left ventricle: The cavity size was normal. Wall thickness was increased in a pattern of moderate LVH. Systolic function was normal. The estimated ejection fraction was in the range of 60% to 65%. Wall motion was normal; there were no regional wall motion abnormalities. Doppler parameters are consistent with abnormal eft ventricular relaxation (grade 1 diastolic  dysfunction). Doppler parameters are consistent with indeterminate ventricular filling pressure. - Aortic valve: Transvalvular velocity was within the normal range. There was no stenosis. There was mild regurgitation. - Aorta:  Ascending aortic diameter: 41 mm (S). - Ascending aorta: The ascending aorta was mildly dilated. - Mitral valve: Transvalvular velocity was within the normal range. There was no evidence for stenosis. There was trivial regurgitation. - Left atrium: The atrium was mildly dilated. - Right ventricle: The cavity size was normal. Wall thickness was normal. Systolic function was normal. - Tricuspid valve: There was mild regurgitation. - Pulmonary arteries: Systolic pressure was within the normal range. PA peak pressure: 19 mm Hg (S).   Antimicrobials:  None    Objective: Vitals:   02/25/18 2350 02/26/18 0035 02/26/18 0523 02/26/18 0801  BP: 135/65  118/74 (!) 145/83  Pulse: 64  63   Resp: 14  19   Temp:  97.8 F (36.6 C) 98.5 F (36.9 C)   TempSrc:  Oral Oral   SpO2: 99%  96%   Weight:   75.8 kg   Height:        Intake/Output Summary (Last 24 hours) at 02/26/2018 0958 Last data filed at 02/26/2018 0800 Gross per 24 hour  Intake 480 ml  Output 325 ml  Net 155 ml   Filed Weights   02/24/18 1108 02/25/18 0444 02/26/18 0523  Weight: 83.9 kg 83.9 kg 75.8 kg    Examination:  Constitutional: NAD Eyes: PERRL, lids and conjunctivae normal ENMT: mmm Respiratory: Clear bilaterally without wheezing or crackles.  Normal respiratory effort Cardiovascular: Regular rate and rhythm, no murmurs.  No peripheral edema Abdomen: Soft, nontender, nondistended, positive bowel sounds Musculoskeletal: no clubbing / cyanosis. Skin: No rash seen Neurologic: No focal deficits   Data Reviewed: I have independently reviewed following labs and imaging studies   CBC: Recent Labs  Lab 02/24/18 1121 02/25/18 0956  WBC 8.8 10.2  NEUTROABS 6.9  --   HGB 13.3 12.7*  HCT 40.6 37.5*  MCV 91.2 90.6  PLT 198 791   Basic Metabolic Panel: Recent Labs  Lab 02/24/18 1121 02/24/18 1148 02/25/18 0725  NA 137  --  136  K 3.9  --  4.9  CL 99  --  104  CO2 27  --  24  GLUCOSE 119*  --  100*  BUN 8   --  11  CREATININE 0.92 0.70 0.94  CALCIUM 8.9  --  8.9   GFR: Estimated Creatinine Clearance: 66.9 mL/min (by C-G formula based on SCr of 0.94 mg/dL). Liver Function Tests: Recent Labs  Lab 02/24/18 1121 02/25/18 0725  AST 20 24  ALT 22 16  ALKPHOS 65 57  BILITOT 0.6 1.3*  PROT 6.8 6.3*  ALBUMIN 3.8 3.7   Recent Labs  Lab 02/24/18 1121  LIPASE 23   No results for input(s): AMMONIA in the last 168 hours. Coagulation Profile: No results for input(s): INR, PROTIME in the last 168 hours. Cardiac Enzymes: Recent Labs  Lab 02/24/18 2216 02/25/18 0725 02/25/18 1301  TROPONINI <0.03 <0.03 <0.03   BNP (last 3 results) No results for input(s): PROBNP in the last 8760 hours. HbA1C: No results for input(s): HGBA1C in the last 72 hours. CBG: No results for input(s): GLUCAP in the last 168 hours. Lipid Profile: No results for input(s): CHOL, HDL, LDLCALC, TRIG, CHOLHDL, LDLDIRECT in the last 72 hours. Thyroid Function Tests: No results for input(s): TSH, T4TOTAL, FREET4, T3FREE, THYROIDAB in the last 72 hours. Anemia Panel: No  results for input(s): VITAMINB12, FOLATE, FERRITIN, TIBC, IRON, RETICCTPCT in the last 72 hours. Urine analysis:    Component Value Date/Time   COLORURINE COLORLESS (A) 02/24/2018 1539   APPEARANCEUR CLEAR 02/24/2018 1539   LABSPEC 1.025 02/24/2018 1539   PHURINE 8.0 02/24/2018 1539   GLUCOSEU NEGATIVE 02/24/2018 1539   HGBUR NEGATIVE 02/24/2018 1539   BILIRUBINUR NEGATIVE 02/24/2018 1539   KETONESUR NEGATIVE 02/24/2018 1539   PROTEINUR NEGATIVE 02/24/2018 1539   UROBILINOGEN 0.2 05/03/2013 1327   NITRITE NEGATIVE 02/24/2018 1539   LEUKOCYTESUR NEGATIVE 02/24/2018 1539   Sepsis Labs: Invalid input(s): PROCALCITONIN, LACTICIDVEN  No results found for this or any previous visit (from the past 240 hour(s)).    Radiology Studies: Dg Ankle Complete Left  Result Date: 02/26/2018 CLINICAL DATA:  Fall.  LEFT ankle pain tonight. EXAM: LEFT  ANKLE COMPLETE - 3+ VIEW COMPARISON:  None. FINDINGS: No fracture deformity nor dislocation. Corticated bony fragment inferior to medial malleolus most compatible with old avulsion injury. The ankle mortise appears congruent and the tibiofibular syndesmosis intact. Small plantar calcaneal spur. No destructive bony lesions. Soft tissue planes are non-suspicious. Mild vascular calcifications. IMPRESSION: No acute fracture deformity or dislocation. Electronically Signed   By: Elon Alas M.D.   On: 02/26/2018 03:07   Ct Head Wo Contrast  Result Date: 02/26/2018 CLINICAL DATA:  78 year old male with fall. EXAM: CT HEAD WITHOUT CONTRAST CT CERVICAL SPINE WITHOUT CONTRAST TECHNIQUE: Multidetector CT imaging of the head and cervical spine was performed following the standard protocol without intravenous contrast. Multiplanar CT image reconstructions of the cervical spine were also generated. COMPARISON:  Head CT dated 02/24/2018 FINDINGS: CT HEAD FINDINGS Brain: There is age-related atrophy and chronic microvascular ischemic changes. There is no acute intracranial hemorrhage. No mass effect or midline shift. No extra-axial fluid collection. Vascular: No hyperdense vessel or unexpected calcification. Skull: Normal. Negative for fracture or focal lesion. Sinuses/Orbits: No acute finding. Other: None CT CERVICAL SPINE FINDINGS Alignment: No acute subluxation. Grade 1 C7-T1 anterolisthesis. Skull base and vertebrae: No acute fracture. C4-C7 ACDF. Soft tissues and spinal canal: No prevertebral fluid or swelling. No visible canal hematoma. Disc levels: C5-C7 fusion. No acute findings. Multilevel facet hypertrophy most prominent at C2-C3 and C3-C4 on the right Upper chest: None Other: Bilateral carotid bulb calcified plaques with high-grade narrowing of the carotid artery on the left. IMPRESSION: 1. No acute intracranial hemorrhage. 2. Age-related atrophy and chronic microvascular ischemic changes. 3. No acute/traumatic  cervical spine pathology. 4. C4-C7 ACDF and grade 1 C7-T1 anterolisthesis. 5. Bilateral carotid bulb calcified plaques with high-grade narrowing of the left carotid artery. Electronically Signed   By: Anner Crete M.D.   On: 02/26/2018 03:31   Ct Head Wo Contrast  Result Date: 02/24/2018 CLINICAL DATA:  Headache EXAM: CT HEAD WITHOUT CONTRAST TECHNIQUE: Contiguous axial images were obtained from the base of the skull through the vertex without intravenous contrast. COMPARISON:  09/11/2015 FINDINGS: Brain: There is no mass, hemorrhage or extra-axial collection. There is generalized atrophy without lobar predilection. Hypodensity of the white matter is most commonly associated with chronic microvascular disease. Vascular: Atherosclerotic calcification of the internal carotid arteries at the skull base. No abnormal hyperdensity of the major intracranial arteries or dural venous sinuses. Skull: The visualized skull base, calvarium and extracranial soft tissues are normal. Sinuses/Orbits: No fluid levels or advanced mucosal thickening of the visualized paranasal sinuses. No mastoid or middle ear effusion. The orbits are normal. IMPRESSION: 1. No acute intracranial abnormality. 2. Atrophy and chronic  microvascular ischemia. Electronically Signed   By: Ulyses Jarred M.D.   On: 02/24/2018 17:24   Ct Cervical Spine Wo Contrast  Result Date: 02/26/2018 CLINICAL DATA:  78 year old male with fall. EXAM: CT HEAD WITHOUT CONTRAST CT CERVICAL SPINE WITHOUT CONTRAST TECHNIQUE: Multidetector CT imaging of the head and cervical spine was performed following the standard protocol without intravenous contrast. Multiplanar CT image reconstructions of the cervical spine were also generated. COMPARISON:  Head CT dated 02/24/2018 FINDINGS: CT HEAD FINDINGS Brain: There is age-related atrophy and chronic microvascular ischemic changes. There is no acute intracranial hemorrhage. No mass effect or midline shift. No extra-axial  fluid collection. Vascular: No hyperdense vessel or unexpected calcification. Skull: Normal. Negative for fracture or focal lesion. Sinuses/Orbits: No acute finding. Other: None CT CERVICAL SPINE FINDINGS Alignment: No acute subluxation. Grade 1 C7-T1 anterolisthesis. Skull base and vertebrae: No acute fracture. C4-C7 ACDF. Soft tissues and spinal canal: No prevertebral fluid or swelling. No visible canal hematoma. Disc levels: C5-C7 fusion. No acute findings. Multilevel facet hypertrophy most prominent at C2-C3 and C3-C4 on the right Upper chest: None Other: Bilateral carotid bulb calcified plaques with high-grade narrowing of the carotid artery on the left. IMPRESSION: 1. No acute intracranial hemorrhage. 2. Age-related atrophy and chronic microvascular ischemic changes. 3. No acute/traumatic cervical spine pathology. 4. C4-C7 ACDF and grade 1 C7-T1 anterolisthesis. 5. Bilateral carotid bulb calcified plaques with high-grade narrowing of the left carotid artery. Electronically Signed   By: Anner Crete M.D.   On: 02/26/2018 03:31   Ct Angio Chest/abd/pel For Dissection W And/or Wo Contrast  Result Date: 02/24/2018 CLINICAL DATA:  Back pain and constipation. Known abdominal aortic aneurysm. EXAM: CT ANGIOGRAPHY CHEST, ABDOMEN AND PELVIS TECHNIQUE: Multidetector CT imaging through the chest, abdomen and pelvis was performed using the standard protocol during bolus administration of intravenous contrast. Multiplanar reconstructed images and MIPs were obtained and reviewed to evaluate the vascular anatomy. CONTRAST:  19m ISOVUE-370 IOPAMIDOL (ISOVUE-370) INJECTION 76% COMPARISON:  08/19/2016 CT AP, CXR 02/15/2017 and 07/04/2015 FINDINGS: CTA CHEST FINDINGS Cardiovascular: Unenhanced images of the chest demonstrate aortic atherosclerosis without mural hematoma. After IV contrast, no aortic dissection is identified. Aneurysmal dilatation of the ascending thoracic aorta to 4.7 cm is identified at the level of  the main pulmonary artery. Satisfactory opacification of the pulmonary arteries to the segmental level. No acute pulmonary embolus. Heart size is top normal without pericardial effusion or thickening. Left main, LAD and RCA coronary arteriosclerosis is noted. Pacemaker apparatus projects over the left upper anterior chest wall with leads in the right atrium and right ventricle. Mediastinum/Nodes: No axillary, supraclavicular, mediastinal nor hilar lymphadenopathy. Retrosternal extension of the thyroid gland without dominant mass is noted. The CT appearance of the thoracic esophagus is unremarkable. The trachea is midline and mainstem bronchi appear patent. No intraluminal abnormalities are identified within. Lungs/Pleura: Lungs are clear. No pleural effusion or pneumothorax. Musculoskeletal: ACDF of the included lower cervical spine. Chronic mild-to-moderate anterior compression fracture of T6 without retropulsion. Mild thoracic spondylosis. Osteoarthritis of the included glenohumeral joint on the right. No acute nor suspicious osseous abnormalities. Review of the MIP images confirms the above findings. CTA ABDOMEN AND PELVIS FINDINGS VASCULAR Aorta: Infrarenal 3.3 cm fusiform abdominal aortic aneurysm with soft and hard plaque along the anterior and lateral walls bilaterally. No dissection is identified. Celiac: Patent without evidence of aneurysm, dissection, vasculitis or significant stenosis. Atherosclerosis at the origin without significant stenosis. SMA: Patent without evidence of aneurysm, dissection, vasculitis or significant stenosis. Renals:  Patent bilateral renal artery stents are noted without significant stenosis. No aneurysm, dissection or evidence of vasculitis. No conclusive findings for fibromuscular dysplasia. IMA: Patent Inflow: Patent without evidence of aneurysm, dissection, vasculitis or significant stenosis. Veins: No obvious venous abnormality within the limitations of this arterial phase  study. Review of the MIP images confirms the above findings. NON-VASCULAR Hepatobiliary: Subtle nodularity of the liver surface compatible with morphologic changes of cirrhosis. No space-occupying mass. The gallbladder is physiologically distended without stones. Pancreas: Normal Spleen: Normal Adrenals/Urinary Tract: Normal bilateral adrenal glands. Chronic renal cortical scarring and atrophy of the right kidney with bilateral low attenuating lesions compatible with renal cysts, some of which are too small to further characterize. Redemonstration of a 12 mm indeterminate interpolar lesion off the right kidney without significant change in size and attenuation. On prior CT, findings were consistent with a Bosniak category 2 cyst, possibly hemorrhagic. No significant interval progression in size or development of new change. No obstructive uropathy. The urinary bladder is unremarkable for the degree of distention. Stomach/Bowel: Normal appendix is identified. The stomach, duodenal sweep, ligament of Treitz and small intestine are unremarkable. The distal and terminal ileum are within normal limits. Average stool retention is seen within the colon with scattered colonic diverticulosis more so along the sigmoid colon. No acute diverticulitis. Lymphatic: No pelvic sidewall, inguinal, mesenteric, or retroperitoneal adenopathy. Small porta hepatic lymph node measuring 8 mm short axis is noted. Reproductive: Enlarged prostate measuring approximately 6.2 x 5.4 x 56.4 cm impressing upon the base of the bladder is again demonstrated. Unremarkable seminal vesicles. Other: No free air nor free fluid. Musculoskeletal: Lumbar spondylosis with degenerative disc disease L2 through L5. Moderate broad-based disc bulge at L3-4 contributing to mild canal stenosis. Mild disc bulge at L4-5 impressing flattening the ventral aspect of the thecal sac. No aggressive osseous lesions. Review of the MIP images confirms the above findings.  IMPRESSION: Chest CT: 1. Aneurysmal dilatation of the ascending thoracic aorta to 4.7 cm without dissection. 2. No acute pulmonary embolus. 3. Left main, LAD and RCA coronary arteriosclerosis. 4. No active pulmonary disease. Vascular: 1. Infrarenal fusiform abdominal aortic aneurysm unchanged in appearance measuring up to 3.3 cm. No dissection noted. 2. Bilateral renal artery stents, patent without significant stenosis. CT AP: 1. Stable Bosniak category 2 lesion off the interpolar aspect of the right kidney measuring 12 mm. No significant interval progression in size or change. Additional more simple appearing cysts are identified bilaterally. 2. Prostatomegaly 3. Subtle surface nodularity of the liver suggesting morphologic changes of cirrhosis without space-occupying mass. No ascites. 4. Broad-based disc bulge L3-4 contributing to mild central canal stenosis. Mild disc bulge at L4-5. 5. No acute nor aggressive osseous lesions. 6. Colonic diverticulosis without acute diverticulitis. Electronically Signed   By: Ashley Royalty M.D.   On: 02/24/2018 14:06    Marzetta Board, MD, PhD [facility]  Contact via  [email or web address]  [place] P: [phone]  F: [phone]

## 2018-02-26 NOTE — Social Work (Signed)
Patient has chosen [facility] and [facility] has offered a bed. Patient's PASRR is pending, sent clinicals to PASRR review. CSW to follow.  Estanislado Emms, LCSW [phone]

## 2018-02-26 NOTE — Clinical Social Work Note (Signed)
Clinical Social Work Assessment  Patient Details  Name: Kevin Wiley MRN: [id number] Date of Birth: March 02, 1940  Date of referral:  02/26/18               Reason for consult:  Facility Placement, Discharge Planning                Permission sought to share information with:  Chartered certified accountant granted to share information::  Yes, Verbal Permission Granted  Name::        Agency::  [place] [facility]  Relationship::     Contact Information:     Housing/Transportation Living arrangements for the past 2 months:  Single Family Home Source of Information:  Patient Patient Interpreter Needed:  None Criminal Activity/Legal Involvement Pertinent to Current Situation/Hospitalization:  No - Comment as needed Significant Relationships:  Significant Other, Adult Children Lives with:  Spouse Do you feel safe going back to the place where you live?  No Need for family participation in patient care:  No (Coment)  Care giving concerns:  Patient comes from home with wife who is disabled and unable to assist. Patient reports that he is currently unable to care for himself, and wants to go to rehab to help regain his strength.    Social Worker assessment / plan:  SW Intern met with patient at bedside, and introduced self and role in discharge planning. SW Intern discussed physical therapy recommendation to go to SNF, and patient is agreeable. Patient reports that he went to [facility] approximately 1 year ago, and would like to return because it is close to home/family and he was pleased with the care. Patient reported that before admitting to the hospital he was able to manage his care needs at home with his wife who is disabled.   CSW/SW Intern to send referrals and follow up with patient regarding bed offers. Patient's PASSAR is under review, and insurance will only cover SNF after three days in hospital. CSW/SW Intern will continue to support in discharge.   Employment status:   Retired Forensic scientist:  [employer] PT Recommendations:  [place] / Referral to community resources:  [place]  Patient/Family's Response to care:  Patient appreciative of support.   Patient/Family's Understanding of and Emotional Response to Diagnosis, Current Treatment, and Prognosis:  Patient with good understanding of PT recommendations and care needs.    Emotional Assessment Appearance:  Appears stated age Attitude/Demeanor/Rapport:  Engaged, Gracious Affect (typically observed):  Pleasant, Appropriate Orientation:  Oriented to  Time, Oriented to Self, Oriented to Place, Oriented to Situation Alcohol / Substance use:  Illicit Drugs, Alcohol Use Psych involvement (Current and /or in the community):  No (Comment)  Discharge Needs  Concerns to be addressed:  Substance Abuse Concerns, Care Coordination, Discharge Planning Concerns Readmission within the last 30 days:  No Current discharge risk:  Substance Abuse Barriers to Discharge:  Continued Medical Work up   Arlis Porta, [place] Work 02/26/2018, 10:47 AM

## 2018-02-26 NOTE — NC FL2 (Signed)
[name] LEVEL OF CARE SCREENING TOOL     IDENTIFICATION  Patient Name: Kevin Wiley Birthdate: [birth year] Sex: male Admission Date (Current Location): 02/24/2018  [facility] and [place] Number:  Herbalist and Address:  [facility], [place] [address] [place], [place] [address]      Provider Number: [id number]  Attending Physician Name and Address:  Caren Griffins, MD  Relative Name and Phone Number:  Jackson Coffield, spouse, [phone]    Current Level of Care: Hospital Recommended Level of Care: [place] Prior Approval Number:    Date Approved/Denied:   PASRR Number: pending  Discharge Plan: SNF    Current Diagnoses: Patient Active Problem List   Diagnosis Date Noted  . Hypertensive urgency 02/24/2018  . GERD (gastroesophageal reflux disease) 03/17/2017  . Hyponatremia 02/23/2017  . Normocytic anemia 02/23/2017  . B12 deficiency 02/23/2017  . Folate deficiency 02/23/2017  . Intractable pain 02/15/2017  . Dehydration   . Anxiety and depression 08/28/2016  . Chest pain 07/04/2015  . Chest pain with high risk for cardiac etiology 07/04/2015  . Paroxysmal atrial tachycardia ([place]) 05/05/2015  . Low back pain 08/16/2013  . Neck pain 08/16/2013  . Polyneuropathy 06/14/2013  . Abnormality of gait 06/14/2013  . SSS (sick sinus syndrome) ([place]) 06/10/2013  . Second degree AV block 06/10/2013  . Elective replacement indicated for pacemaker 06/10/2013  . Ataxia 05/03/2013  . Altered mental status 05/03/2013  . Orthostatic hypotension 12/04/2012  . HTN (hypertension) 12/04/2012  . Hyperlipidemia, mixed 12/04/2012  . PTSD (post-traumatic stress disorder) 12/04/2012  . CAD (coronary artery disease) 12/04/2012  . Pacemaker dual chamber Medtronic 2007, new generator 06/11/13 12/04/2012  . Crohn disease ([place]) 12/04/2012  . Bilateral renal artery stenosis ([place]) 12/04/2012  . Fatigue 12/04/2012    Orientation  RESPIRATION BLADDER Height & Weight     Self, Time, Situation, Place  Normal Continent Weight: 75.8 kg Height:  5\' 9"  (175.3 cm)  BEHAVIORAL SYMPTOMS/MOOD NEUROLOGICAL BOWEL NUTRITION STATUS      Continent Diet(please see DC summary)  AMBULATORY STATUS COMMUNICATION OF NEEDS Skin   Extensive Assist Verbally Normal                       Personal Care Assistance Level of Assistance  Bathing, Feeding, Dressing Bathing Assistance: Limited assistance Feeding assistance: Independent Dressing Assistance: Limited assistance     Functional Limitations Info  Sight, Hearing, Speech Sight Info: Adequate Hearing Info: Adequate Speech Info: Adequate    SPECIAL CARE FACTORS FREQUENCY  PT (By licensed PT), OT (By licensed OT)     PT Frequency: 5x/week OT Frequency: 5x/week            Contractures Contractures Info: Not present    Additional Factors Info  Code Status, Allergies, Psychotropic Code Status Info: Full Allergies Info: No Known Allergies Psychotropic Info: trazadone, buspar         Current Medications (02/26/2018):  This is the current hospital active medication list Current Facility-Administered Medications  Medication Dose Route Frequency Provider Last Rate Last Dose  . acetaminophen (TYLENOL) tablet 650 mg  650 mg Oral Q6H PRN Lavina Hamman, MD   650 mg at 02/25/18 0800   Or  . acetaminophen (TYLENOL) suppository 650 mg  650 mg Rectal Q6H PRN Lavina Hamman, MD      . alum & mag hydroxide-simeth (MAALOX/MYLANTA) 200-200-20 MG/5ML suspension 30 mL  30 mL Oral Q4H PRN  Lavina Hamman, MD   30 mL at 02/24/18 2257  . atenolol (TENORMIN) tablet 100 mg  100 mg Oral Daily Lavina Hamman, MD   100 mg at 02/26/18 0801  . atorvastatin (LIPITOR) tablet 80 mg  80 mg Oral q1800 Lavina Hamman, MD   80 mg at 02/25/18 1637  . busPIRone (BUSPAR) tablet 5 mg  5 mg Oral BID Schorr, Rhetta Mura, NP   5 mg at 02/26/18 0801  . cloNIDine (CATAPRES - Dosed in mg/24 hr) patch  0.1 mg  0.1 mg Transdermal Weekly Lavina Hamman, MD   0.1 mg at 02/24/18 2009  . enoxaparin (LOVENOX) injection 40 mg  40 mg Subcutaneous Q24H Lavina Hamman, MD   40 mg at 02/25/18 1638  . gabapentin (NEURONTIN) capsule 800 mg  800 mg Oral TID Lavina Hamman, MD   800 mg at 02/26/18 0801  . hydrALAZINE (APRESOLINE) injection 10 mg  10 mg Intravenous Q4H PRN Lavina Hamman, MD   10 mg at 02/25/18 0511  . hydrALAZINE (APRESOLINE) tablet 50 mg  50 mg Oral TID Lavina Hamman, MD   50 mg at 02/26/18 0801  . HYDROcodone-acetaminophen (NORCO/VICODIN) 5-325 MG per tablet 1 tablet  1 tablet Oral Q6H PRN Lavina Hamman, MD   1 tablet at 02/26/18 0910  . nitroGLYCERIN (NITROSTAT) SL tablet 0.4 mg  0.4 mg Sublingual Q5 min PRN Lavina Hamman, MD      . ondansetron [facility]) tablet 4 mg  4 mg Oral Q6H PRN Lavina Hamman, MD       Or  . ondansetron [facility]) injection 4 mg  4 mg Intravenous Q6H PRN Lavina Hamman, MD   4 mg at 02/24/18 2101  . pantoprazole (PROTONIX) EC tablet 40 mg  40 mg Oral QHS Caren Griffins, MD   40 mg at 02/25/18 2127  . sodium chloride flush (NS) 0.9 % injection 10-40 mL  10-40 mL Intracatheter PRN Caren Griffins, MD      . sodium chloride flush (NS) 0.9 % injection 3 mL  3 mL Intravenous Q12H Lavina Hamman, MD   3 mL at 02/26/18 0803  . tamsulosin (FLOMAX) capsule 0.4 mg  0.4 mg Oral QPC supper Lavina Hamman, MD   0.4 mg at 02/25/18 1637  . traZODone (DESYREL) tablet 200 mg  200 mg Oral QHS Schorr, Rhetta Mura, NP   200 mg at 02/25/18 2127  . venlafaxine XR (EFFEXOR-XR) 24 hr capsule 225 mg  225 mg Oral Q breakfast Schorr, Rhetta Mura, NP   225 mg at 02/26/18 0801  . zolpidem (AMBIEN) tablet 5 mg  5 mg Oral QHS PRN Schorr, Rhetta Mura, NP   5 mg at 02/25/18 2303     Discharge Medications: Please see discharge summary for a list of discharge medications.  Relevant Imaging Results:  Relevant Lab Results:   Additional Information SSN: [id number]  Estanislado Emms, LCSW

## 2018-02-27 NOTE — Care Management Note (Signed)
Case Management Note  Patient Details  Name: Kevin Wiley MRN: [id number] Date of Birth: [birth year]  Subjective/Objective:  Pt presented for abdominal pain and found to have hypertensive urgency. PT/OT recommendations for SNF. Plan to transition to [facility] 02-28-18-CSW assisting with disposition needs.                   Action/Plan: CM will continue to monitor for additional transition of care needs.   Expected Discharge Date:                  Expected Discharge Plan:  Skilled Nursing Facility  In-House Referral:  Clinical Social Work  Discharge planning Services  CM Consult  Post Acute Care Choice:  NA Choice offered to:  NA  DME Arranged:  N/A DME Agency:  NA  HH Arranged:  NA HH Agency:  NA  Status of Service:  Completed, signed off  If discussed at [place] of Stay Meetings, dates discussed:    Additional Comments:  Bethena Roys, RN 02/27/2018, 3:46 PM

## 2018-02-27 NOTE — Care Management Important Message (Signed)
Important Message  Patient Details  Name: Kevin Wiley MRN: [id number] Date of Birth: 18-Oct-1940   Medicare Important Message Given:  Yes    [name] P [place] 02/27/2018, 11:35 AM

## 2018-02-27 NOTE — Progress Notes (Signed)
PROGRESS NOTE  Kevin Wiley WPY:[id number] DOB: Mar 04, 1940 DOA: 02/24/2018 PCP: Burnard Bunting, MD   LOS: 2 days   Brief Narrative / Interim history: Kevin Wiley is a 78 y.o. male with Past medical history of anxiety, PTSD, depression, CAD S/P PCI, BPH, GERD, HTN, pacemaker implant, PAF. Patient presented with complaints of abdominal pain.  Was found to have hypertensive urgency and was admitted to the hospital for blood pressure control.  No abdominal pain this morning, has a mild headache.  He is a poor historian  Subjective: -Feeling slightly better today, no chest pain, no abdominal pain,  Assessment & Plan: Principal Problem:   Hypertensive urgency Active Problems:   Hyperlipidemia, mixed   PTSD (post-traumatic stress disorder)   CAD (coronary artery disease)   Pacemaker dual chamber Medtronic 2007, new generator 06/11/13   Crohn disease ([place])   Bilateral renal artery stenosis ([facility])   Fatigue   Polyneuropathy   Paroxysmal atrial tachycardia ([facility])   B12 deficiency   Principal Problem Hypertensive urgency -Abnormal ECG. Patient presents with complaints of epigastric abdominal pain with nausea and vomiting. EKG shows diffuse T wave inversions.  Chest pain has resolved, cardiac enzymes have remained negative. -CT chest on admission with dilatation of thoracic aorta with aneurysm 4.7 cm without dissection, no PE, triple-vessel CAD without any acute abnormality in the lungs. Also seen infrarenal abdominal aortic aneurysm stable 3.3 cm no dissection. Prior history of bilateral renal artery stenosis with stent patent. -Blood pressure much improved, currently he is on atenolol 100 g daily, clonidine 0.1 mg weekly patch, hydralazine 50 mg 3 times daily. -Continue same regimen  Active Problems Acute encephalopathy -Metabolic in nature from medication as well as hypertension. -CT head unremarkable. -unable to perform MRI due to pacemaker. -Improved, now alert and oriented x4,  overall very weak, PT is in the room and he is excited to get up  Chronic polyneuropathy -Continue gabapentin  Anxiety, PTSD -continuing home regimen.  GERD -Continuing PPI  Sick sinus syndrome with pacemaker in place /paroxysmal atrial tachycardia -No issues, followed by cardiology as an outpatient   Scheduled Meds: . atenolol  100 mg Oral Daily  . atorvastatin  80 mg Oral q1800  . busPIRone  5 mg Oral BID  . cloNIDine  0.1 mg Transdermal Weekly  . enoxaparin (LOVENOX) injection  40 mg Subcutaneous Q24H  . gabapentin  800 mg Oral TID  . hydrALAZINE  50 mg Oral TID  . pantoprazole  40 mg Oral QHS  . sodium chloride flush  3 mL Intravenous Q12H  . tamsulosin  0.4 mg Oral QPC supper  . traZODone  200 mg Oral QHS  . venlafaxine XR  225 mg Oral Q breakfast   Continuous Infusions: PRN Meds:.acetaminophen **OR** acetaminophen, alum & mag hydroxide-simeth, hydrALAZINE, HYDROcodone-acetaminophen, nitroGLYCERIN, ondansetron **OR** ondansetron (ZOFRAN) IV, sodium chloride flush, zolpidem  DVT prophylaxis: Lovenox Code Status: Full code Family Communication: no family at bedside  Disposition Plan: SNF  Consultants:   None   Procedures:   2D echo:  Study Conclusions - Left ventricle: The cavity size was normal. Wall thickness was increased in a pattern of moderate LVH. Systolic function was normal. The estimated ejection fraction was in the range of 60% to 65%. Wall motion was normal; there were no regional wall motion abnormalities. Doppler parameters are consistent with abnormal eft ventricular relaxation (grade 1 diastolic dysfunction). Doppler parameters are consistent with indeterminate ventricular filling pressure. - Aortic valve: Transvalvular velocity was within the normal range. There  was no stenosis. There was mild regurgitation. - Aorta: Ascending aortic diameter: 41 mm (S). - Ascending aorta: The ascending aorta was mildly dilated. - Mitral valve: Transvalvular  velocity was within the normal range. There was no evidence for stenosis. There was trivial regurgitation. - Left atrium: The atrium was mildly dilated. - Right ventricle: The cavity size was normal. Wall thickness was normal. Systolic function was normal. - Tricuspid valve: There was mild regurgitation. - Pulmonary arteries: Systolic pressure was within the normal range. PA peak pressure: 19 mm Hg (S).   Antimicrobials:  None    Objective: Vitals:   02/26/18 1724 02/26/18 2116 02/27/18 0529 02/27/18 0725  BP: 124/73 120/67 110/68 121/74  Pulse:  (!) 59 65 66  Resp:  (!) 9 (!) 9   Temp:  98.6 F (37 C) 98.7 F (37.1 C) 98.6 F (37 C)  TempSrc:  Oral Oral Oral  SpO2:  95% 95% 96%  Weight:   75.2 kg   Height:        Intake/Output Summary (Last 24 hours) at 02/27/2018 1111 Last data filed at 02/27/2018 0800 Gross per 24 hour  Intake 360 ml  Output 1275 ml  Net -915 ml   Filed Weights   02/25/18 0444 02/26/18 0523 02/27/18 0529  Weight: 83.9 kg 75.8 kg 75.2 kg    Examination:  Constitutional: NAD Respiratory: CTA Cardiovascular: RRR  Data Reviewed: I have independently reviewed following labs and imaging studies   CBC: Recent Labs  Lab 02/24/18 1121 02/25/18 0956  WBC 8.8 10.2  NEUTROABS 6.9  --   HGB 13.3 12.7*  HCT 40.6 37.5*  MCV 91.2 90.6  PLT 198 672   Basic Metabolic Panel: Recent Labs  Lab 02/24/18 1121 02/24/18 1148 02/25/18 0725  NA 137  --  136  K 3.9  --  4.9  CL 99  --  104  CO2 27  --  24  GLUCOSE 119*  --  100*  BUN 8  --  11  CREATININE 0.92 0.70 0.94  CALCIUM 8.9  --  8.9   GFR: Estimated Creatinine Clearance: 66.9 mL/min (by C-G formula based on SCr of 0.94 mg/dL). Liver Function Tests: Recent Labs  Lab 02/24/18 1121 02/25/18 0725  AST 20 24  ALT 22 16  ALKPHOS 65 57  BILITOT 0.6 1.3*  PROT 6.8 6.3*  ALBUMIN 3.8 3.7   Recent Labs  Lab 02/24/18 1121  LIPASE 23   No results for input(s): AMMONIA in the last 168  hours. Coagulation Profile: No results for input(s): INR, PROTIME in the last 168 hours. Cardiac Enzymes: Recent Labs  Lab 02/24/18 2216 02/25/18 0725 02/25/18 1301  TROPONINI <0.03 <0.03 <0.03   BNP (last 3 results) No results for input(s): PROBNP in the last 8760 hours. HbA1C: No results for input(s): HGBA1C in the last 72 hours. CBG: No results for input(s): GLUCAP in the last 168 hours. Lipid Profile: No results for input(s): CHOL, HDL, LDLCALC, TRIG, CHOLHDL, LDLDIRECT in the last 72 hours. Thyroid Function Tests: No results for input(s): TSH, T4TOTAL, FREET4, T3FREE, THYROIDAB in the last 72 hours. Anemia Panel: No results for input(s): VITAMINB12, FOLATE, FERRITIN, TIBC, IRON, RETICCTPCT in the last 72 hours. Urine analysis:    Component Value Date/Time   COLORURINE COLORLESS (A) 02/24/2018 1539   APPEARANCEUR CLEAR 02/24/2018 1539   LABSPEC 1.025 02/24/2018 1539   PHURINE 8.0 02/24/2018 1539   GLUCOSEU NEGATIVE 02/24/2018 1539   HGBUR NEGATIVE 02/24/2018 1539   BILIRUBINUR NEGATIVE  02/24/2018 [place] 02/24/2018 1539   PROTEINUR NEGATIVE 02/24/2018 1539   UROBILINOGEN 0.2 05/03/2013 1327   NITRITE NEGATIVE 02/24/2018 1539   LEUKOCYTESUR NEGATIVE 02/24/2018 1539   Sepsis Labs: Invalid input(s): PROCALCITONIN, LACTICIDVEN  No results found for this or any previous visit (from the past 240 hour(s)).    Radiology Studies: Dg Ankle Complete Left  Result Date: 02/26/2018 CLINICAL DATA:  Fall.  LEFT ankle pain tonight. EXAM: LEFT ANKLE COMPLETE - 3+ VIEW COMPARISON:  None. FINDINGS: No fracture deformity nor dislocation. Corticated bony fragment inferior to medial malleolus most compatible with old avulsion injury. The ankle mortise appears congruent and the tibiofibular syndesmosis intact. Small plantar calcaneal spur. No destructive bony lesions. Soft tissue planes are non-suspicious. Mild vascular calcifications. IMPRESSION: No acute fracture  deformity or dislocation. Electronically Signed   By: Elon Alas M.D.   On: 02/26/2018 03:07   Ct Head Wo Contrast  Result Date: 02/26/2018 CLINICAL DATA:  78 year old male with fall. EXAM: CT HEAD WITHOUT CONTRAST CT CERVICAL SPINE WITHOUT CONTRAST TECHNIQUE: Multidetector CT imaging of the head and cervical spine was performed following the standard protocol without intravenous contrast. Multiplanar CT image reconstructions of the cervical spine were also generated. COMPARISON:  Head CT dated 02/24/2018 FINDINGS: CT HEAD FINDINGS Brain: There is age-related atrophy and chronic microvascular ischemic changes. There is no acute intracranial hemorrhage. No mass effect or midline shift. No extra-axial fluid collection. Vascular: No hyperdense vessel or unexpected calcification. Skull: Normal. Negative for fracture or focal lesion. Sinuses/Orbits: No acute finding. Other: None CT CERVICAL SPINE FINDINGS Alignment: No acute subluxation. Grade 1 C7-T1 anterolisthesis. Skull base and vertebrae: No acute fracture. C4-C7 ACDF. Soft tissues and spinal canal: No prevertebral fluid or swelling. No visible canal hematoma. Disc levels: C5-C7 fusion. No acute findings. Multilevel facet hypertrophy most prominent at C2-C3 and C3-C4 on the right Upper chest: None Other: Bilateral carotid bulb calcified plaques with high-grade narrowing of the carotid artery on the left. IMPRESSION: 1. No acute intracranial hemorrhage. 2. Age-related atrophy and chronic microvascular ischemic changes. 3. No acute/traumatic cervical spine pathology. 4. C4-C7 ACDF and grade 1 C7-T1 anterolisthesis. 5. Bilateral carotid bulb calcified plaques with high-grade narrowing of the left carotid artery. Electronically Signed   By: Anner Crete M.D.   On: 02/26/2018 03:31   Ct Cervical Spine Wo Contrast  Result Date: 02/26/2018 CLINICAL DATA:  78 year old male with fall. EXAM: CT HEAD WITHOUT CONTRAST CT CERVICAL SPINE WITHOUT CONTRAST  TECHNIQUE: Multidetector CT imaging of the head and cervical spine was performed following the standard protocol without intravenous contrast. Multiplanar CT image reconstructions of the cervical spine were also generated. COMPARISON:  Head CT dated 02/24/2018 FINDINGS: CT HEAD FINDINGS Brain: There is age-related atrophy and chronic microvascular ischemic changes. There is no acute intracranial hemorrhage. No mass effect or midline shift. No extra-axial fluid collection. Vascular: No hyperdense vessel or unexpected calcification. Skull: Normal. Negative for fracture or focal lesion. Sinuses/Orbits: No acute finding. Other: None CT CERVICAL SPINE FINDINGS Alignment: No acute subluxation. Grade 1 C7-T1 anterolisthesis. Skull base and vertebrae: No acute fracture. C4-C7 ACDF. Soft tissues and spinal canal: No prevertebral fluid or swelling. No visible canal hematoma. Disc levels: C5-C7 fusion. No acute findings. Multilevel facet hypertrophy most prominent at C2-C3 and C3-C4 on the right Upper chest: None Other: Bilateral carotid bulb calcified plaques with high-grade narrowing of the carotid artery on the left. IMPRESSION: 1. No acute intracranial hemorrhage. 2. Age-related atrophy and chronic microvascular ischemic changes.  3. No acute/traumatic cervical spine pathology. 4. C4-C7 ACDF and grade 1 C7-T1 anterolisthesis. 5. Bilateral carotid bulb calcified plaques with high-grade narrowing of the left carotid artery. Electronically Signed   By: Anner Crete M.D.   On: 02/26/2018 03:31    Marzetta Board, MD, PhD [facility]  Contact via  [email or web address]  [place] P: [phone]  F: [phone]

## 2018-02-27 NOTE — Progress Notes (Signed)
Physical Therapy Treatment Patient Details Name: Kevin Wiley MRN: [id number] DOB: 22-Mar-1940 Today's Date: 02/27/2018    History of Present Illness Kevin Wiley is a 78 y.o. male with Past medical history of anxiety, PTSD, depression, CAD S/P PCI, BPH, GERD, HTN, pacemaker implant, PAF.  Presents with Abdominal pain and HTN urgency.      PT Comments    Pt admitted with above diagnosis. Pt currently with functional limitations due to balance and endurance deficits. Pt was able to ambulate with RW with min assist of 2 persons for safety.  Pt needs constant cues for posture and assist for sequencing steps and rW.  Pt progressing slowly and still needs SNF.  Pt will benefit from skilled PT to increase their independence and safety with mobility to allow discharge to the venue listed below.     Follow Up Recommendations  SNF;Supervision/Assistance - 24 hour     Equipment Recommendations  None recommended by PT    Recommendations for Other Services       Precautions / Restrictions Precautions Precautions: Fall Restrictions Weight Bearing Restrictions: No    Mobility  Bed Mobility Overal bed mobility: Needs Assistance Bed Mobility: Supine to Sit     Supine to sit: Min assist     General bed mobility comments: pt required assist to elevate trunk  Transfers Overall transfer level: Needs assistance Equipment used: Rolling walker (2 wheeled) Transfers: Sit to/from [facility] Sit to Stand: Min assist;From elevated surface         General transfer comment: Pt needed min assist to stand.    Ambulation/Gait Ambulation/Gait assistance: Min assist;+2 safety/equipment Gait Distance (Feet): 40 Feet Assistive device: Rolling walker (2 wheeled) Gait Pattern/deviations: Step-through pattern;Decreased stride length;Trunk flexed;Wide base of support;Antalgic   Gait velocity interpretation: <1.31 ft/sec, indicative of household ambulator General Gait Details: Pt  ambulated to door and back to chair with min assist due to need for constant cues for upright posture as pt flexes forward as he fatigues as well as cues for proximity to RW.  Pt needed +2 assist for safety.  Fatigues quickly.     Stairs             Wheelchair Mobility    Modified Rankin (Stroke Patients Only)       Balance Overall balance assessment: Needs assistance Sitting-balance support: Feet supported;Bilateral upper extremity supported Sitting balance-Leahy Scale: Poor Sitting balance - Comments: could not sit EOB without min guard assist at times due to will quickly lean to left or right or posterior lean Postural control: Posterior lean Standing balance support: Bilateral upper extremity supported;During functional activity Standing balance-Leahy Scale: Poor Standing balance comment: relies on UE support and external support with RW                             Cognition Arousal/Alertness: Awake/alert Behavior During Therapy: WFL for tasks assessed/performed Overall Cognitive Status: Within Functional Limits for tasks assessed                                        Exercises General Exercises - Lower Extremity Ankle Circles/Pumps: AROM;Both;10 reps;Supine Long Arc Quad: AROM;Both;10 reps;Seated    General Comments        Pertinent Vitals/Pain Pain Assessment: No/denies pain    Home Living  Prior Function            PT Goals (current goals can now be found in the care plan section) Acute Rehab PT Goals Patient Stated Goal: go home Progress towards PT goals: Progressing toward goals    Frequency    Min 2X/week      PT Plan Current plan remains appropriate;Frequency needs to be updated    Co-evaluation              AM-PAC PT "6 Clicks" Mobility   Outcome Measure  Help needed turning from your back to your side while in a flat bed without using bedrails?: A Lot Help needed  moving from lying on your back to sitting on the side of a flat bed without using bedrails?: A Lot Help needed moving to and from a bed to a chair (including a wheelchair)?: A Lot Help needed standing up from a chair using your arms (e.g., wheelchair or bedside chair)?: A Little Help needed to walk in hospital room?: A Lot Help needed climbing 3-5 steps with a railing? : Total 6 Click Score: 12    End of Session Equipment Utilized During Treatment: Gait belt Activity Tolerance: Patient limited by fatigue Patient left: in chair;with call bell/phone within reach;with chair alarm set Nurse Communication: Mobility status PT Visit Diagnosis: Unsteadiness on feet (R26.81);Muscle weakness (generalized) (M62.81)     Time: 1497-0263 PT Time Calculation (min) (ACUTE ONLY): 15 min  Charges:  $Gait Training: 8-22 mins                     [place] Pager:  [phone]  Office:  H. Rivera Colon 02/27/2018, 10:14 AM

## 2018-02-27 NOTE — Social Work (Addendum)
PASRR completed - [id number], valid 1/24 - 03/29/18. Plan to discharge to [facility] tomorrow. [place] admissions to complete paperwork with patient today in anticipation of admission tomorrow.  Estanislado Emms, LCSW [phone]

## 2018-02-28 DIAGNOSIS — F329 Major depressive disorder, single episode, unspecified: Secondary | ICD-10-CM | POA: Diagnosis not present

## 2018-02-28 DIAGNOSIS — I160 Hypertensive urgency: Secondary | ICD-10-CM | POA: Diagnosis not present

## 2018-02-28 DIAGNOSIS — G629 Polyneuropathy, unspecified: Secondary | ICD-10-CM | POA: Diagnosis not present

## 2018-02-28 DIAGNOSIS — R279 Unspecified lack of coordination: Secondary | ICD-10-CM | POA: Diagnosis not present

## 2018-02-28 DIAGNOSIS — I495 Sick sinus syndrome: Secondary | ICD-10-CM | POA: Diagnosis not present

## 2018-02-28 DIAGNOSIS — R2689 Other abnormalities of gait and mobility: Secondary | ICD-10-CM | POA: Diagnosis not present

## 2018-02-28 DIAGNOSIS — Z950 Presence of cardiac pacemaker: Secondary | ICD-10-CM | POA: Diagnosis not present

## 2018-02-28 DIAGNOSIS — E538 Deficiency of other specified B group vitamins: Secondary | ICD-10-CM | POA: Diagnosis not present

## 2018-02-28 DIAGNOSIS — G9340 Encephalopathy, unspecified: Secondary | ICD-10-CM | POA: Diagnosis not present

## 2018-02-28 DIAGNOSIS — G8929 Other chronic pain: Secondary | ICD-10-CM | POA: Diagnosis not present

## 2018-02-28 DIAGNOSIS — D649 Anemia, unspecified: Secondary | ICD-10-CM | POA: Diagnosis not present

## 2018-02-28 DIAGNOSIS — G4700 Insomnia, unspecified: Secondary | ICD-10-CM | POA: Diagnosis not present

## 2018-02-28 DIAGNOSIS — R41841 Cognitive communication deficit: Secondary | ICD-10-CM | POA: Diagnosis not present

## 2018-02-28 DIAGNOSIS — I739 Peripheral vascular disease, unspecified: Secondary | ICD-10-CM | POA: Diagnosis not present

## 2018-02-28 DIAGNOSIS — K219 Gastro-esophageal reflux disease without esophagitis: Secondary | ICD-10-CM | POA: Diagnosis not present

## 2018-02-28 DIAGNOSIS — Z743 Need for continuous supervision: Secondary | ICD-10-CM | POA: Diagnosis not present

## 2018-02-28 DIAGNOSIS — F419 Anxiety disorder, unspecified: Secondary | ICD-10-CM | POA: Diagnosis not present

## 2018-02-28 DIAGNOSIS — M545 Low back pain: Secondary | ICD-10-CM | POA: Diagnosis not present

## 2018-02-28 DIAGNOSIS — R278 Other lack of coordination: Secondary | ICD-10-CM | POA: Diagnosis not present

## 2018-02-28 DIAGNOSIS — R2681 Unsteadiness on feet: Secondary | ICD-10-CM | POA: Diagnosis not present

## 2018-02-28 DIAGNOSIS — I701 Atherosclerosis of renal artery: Secondary | ICD-10-CM | POA: Diagnosis not present

## 2018-02-28 DIAGNOSIS — I471 Supraventricular tachycardia: Secondary | ICD-10-CM | POA: Diagnosis not present

## 2018-02-28 DIAGNOSIS — I2510 Atherosclerotic heart disease of native coronary artery without angina pectoris: Secondary | ICD-10-CM | POA: Diagnosis not present

## 2018-02-28 DIAGNOSIS — E782 Mixed hyperlipidemia: Secondary | ICD-10-CM | POA: Diagnosis not present

## 2018-02-28 DIAGNOSIS — F4310 Post-traumatic stress disorder, unspecified: Secondary | ICD-10-CM | POA: Diagnosis not present

## 2018-02-28 DIAGNOSIS — N400 Enlarged prostate without lower urinary tract symptoms: Secondary | ICD-10-CM | POA: Diagnosis not present

## 2018-02-28 DIAGNOSIS — M6281 Muscle weakness (generalized): Secondary | ICD-10-CM | POA: Diagnosis not present

## 2018-02-28 DIAGNOSIS — I10 Essential (primary) hypertension: Secondary | ICD-10-CM | POA: Diagnosis not present

## 2018-02-28 DIAGNOSIS — E785 Hyperlipidemia, unspecified: Secondary | ICD-10-CM | POA: Diagnosis not present

## 2018-02-28 MED ORDER — HYDRALAZINE HCL 50 MG PO TABS: 50.0000 mg | ORAL_TABLET | Freq: Three times a day (TID) | ORAL | Status: DC

## 2018-02-28 MED ORDER — CLONIDINE 0.1 MG/24HR TD PTWK: 0.1000 mg | MEDICATED_PATCH | TRANSDERMAL | 12 refills | Status: DC

## 2018-02-28 MED ORDER — ATENOLOL 50 MG PO TABS: 100.0000 mg | ORAL_TABLET | Freq: Every day | ORAL | Status: DC

## 2018-02-28 NOTE — Clinical Social Work Placement (Signed)
   CLINICAL SOCIAL WORK PLACEMENT  NOTE  Date:  02/28/2018  Patient Details  Name: Kevin Wiley MRN: [id number] Date of Birth: 03/17/1940  Clinical Social Work is seeking post-discharge placement for this patient at the [place] level of care (*CSW will initial, date and re-position this form in  chart as items are completed):  Yes   Patient/family provided with [place] Work Department's list of facilities offering this level of care within the geographic area requested by the patient (or if unable, by the patient's family).  Yes   Patient/family informed of their freedom to choose among providers that offer the needed level of care, that participate in Medicare, Medicaid or managed care program needed by the patient, have an available bed and are willing to accept the patient.  Yes   Patient/family informed of 's ownership interest in [facility] and [facility], as well as of the fact that they are under no obligation to receive care at these facilities.  PASRR submitted to EDS on       PASRR number received on 02/27/18     Existing PASRR number confirmed on       FL2 transmitted to all facilities in geographic area requested by pt/family on       FL2 transmitted to all facilities within larger geographic area on       Patient informed that his/her managed care company has contracts with or will negotiate with certain facilities, including the following:        Yes   Patient/family informed of bed offers received.  Patient chooses bed at [facility]     Physician recommends and patient chooses bed at      Patient to be transferred to [facility] on 02/28/18.  Patient to be transferred to facility by [facility]     Patient family notified on 02/28/18 of transfer.  Name of family member notified:  Shanon Brow, son     PHYSICIAN       Additional Comment:     _______________________________________________ Vinie Sill, [place] 02/28/2018, 9:03 AM

## 2018-02-28 NOTE — Discharge Summary (Signed)
Physician Discharge Summary  [name]:[id number] DOB: [birth year] DOA: 02/24/2018  PCP: Burnard Bunting, MD  Admit date: 02/24/2018 Discharge date: 02/28/2018  Admitted From: home Disposition:  SNF  Recommendations for Outpatient Follow-up:  1. Follow up with PCP in 1-2 weeks  Home Health: none Equipment/Devices: none  Discharge Condition: stable CODE STATUS: Full ccode Diet recommendation: low sodium  HPI: Per admitting MD, Kevin Wiley is a 78 y.o. male with Past medical history of anxiety, PTSD, depression, CAD S/P PCI, BPH, GERD, HTN, pacemaker implant, PAF. Patient presented with complaints of abdominal pain. Appears to be a poor historian. Tells me that for last few days he has been having abdominal pain which is diffuse, also having pain in his left leg, had some nausea and overall did not feel good.  He mentions that he never had this prolonged feeling of "not feeling good".  Unable to specify further regarding characteristic of his pain in his leg as well as in his abdomen.  No fever no chills reported.  Generally compliant with all his medication.  No diarrhea reported.  No change in medications reported.  Keeps on repeating why I am sweating a lot. Son at bedside tells me that patient is more confused than his baseline.  At his baseline patient is able to manage his own finances, walks on his own, does his medications on his own. ED Course: Presented with epigastric abdominal pain, with high blood pressure and reports of diaphoresis as well as nausea there was some concern for dissection and underwent CT chest abdomen and pelvis with contrast.  Which ruled out dissection as well as any acute intra-abdominal abdominal pathology.  Patient was referred for further work-up of his abdominal pain as well as acute hypertensive urgency.  Hospital Course: Hypertensive urgency -Abnormal ECG. Patient presents with complaints of epigastric abdominal pain with nausea and vomiting. EKG  shows diffuse T wave inversions.  Chest pain has resolved, cardiac enzymes have remained negative and ACS has been ruled out. CT chest on admission with dilatation of thoracic aorta with aneurysm 4.7 cm without dissection, no PE, triple-vessel CAD without any acute abnormality in the lungs. Also seen infrarenal abdominal aortic aneurysm stable 3.3 cm no dissection. Prior history of bilateral renal artery stenosis with stent patent.  Patient was maintained the hospital and on atenolol 100 mg daily as well as clonidine 0.1 mg weekly patch along with hydralazine, his blood pressure has been under excellent control, please continue with same regimen and continue close monitoring of blood pressure.  Active Problems Acute encephalopathy -Metabolic in nature from medication as well as hypertension. CT head unremarkable. Unable to perform MRI due to pacemaker.  Encephalopathy resolved with improvement in his blood pressure, he is back to baseline alert and oriented x4.  Remains weak requiring SNF on discharge Chronic polyneuropathy -Continue gabapentin Anxiety, PTSD -continuing home regimen. GERD -Continuing PPI Sick sinus syndrome with pacemaker in place /paroxysmal atrial tachycardia -No issues, followed by cardiology as an outpatient   Discharge Diagnoses:  Principal Problem:   Hypertensive urgency Active Problems:   Hyperlipidemia, mixed   PTSD (post-traumatic stress disorder)   CAD (coronary artery disease)   Pacemaker dual chamber Medtronic 2007, new generator 06/11/13   Crohn disease ([place])   Bilateral renal artery stenosis (HCC)   Fatigue   Polyneuropathy   Paroxysmal atrial tachycardia ([place])   B12 deficiency  Discharge Instructions   Allergies as of 02/28/2018   No Known Allergies  Medication List    STOP taking these medications   zolpidem 10 MG tablet Commonly known as:  AMBIEN     TAKE these medications   atenolol 50 MG tablet Commonly known as:  TENORMIN Take 2  tablets (100 mg total) by mouth daily. What changed:  how much to take   atorvastatin 80 MG tablet Commonly known as:  LIPITOR Take 80 mg by mouth at bedtime.   busPIRone 10 MG tablet Commonly known as:  BUSPAR Take 5 mg by mouth 2 (two) times daily.   cloNIDine 0.1 mg/24hr patch Commonly known as:  CATAPRES - Dosed in mg/24 hr Place 1 patch (0.1 mg total) onto the skin once a week. Start taking on:  March 03, 2018   cyclobenzaprine 10 MG tablet Commonly known as:  FLEXERIL Take 10 mg by mouth daily as needed for muscle spasms.   gabapentin 800 MG tablet Commonly known as:  NEURONTIN Take 1 tablet (800 mg total) by mouth 3 (three) times daily.   hydrALAZINE 50 MG tablet Commonly known as:  APRESOLINE Take 1 tablet (50 mg total) by mouth 3 (three) times daily.   naproxen sodium 220 MG tablet Commonly known as:  ALEVE Take 220-440 mg by mouth 2 (two) times daily as needed (for headaches or pain).   nitroGLYCERIN 0.4 MG SL tablet Commonly known as:  NITROSTAT Place 1 tablet (0.4 mg total) under the tongue every 5 (five) minutes as needed for chest pain (CP or SOB). What changed:  reasons to take this   omeprazole 20 MG capsule Commonly known as:  PRILOSEC Take 20 mg by mouth 2 (two) times daily before a meal.   tamsulosin 0.4 MG Caps capsule Commonly known as:  FLOMAX Take 0.4 mg by mouth at bedtime.   traZODone 100 MG tablet Commonly known as:  DESYREL Take 1 tablet (100 mg total) by mouth at bedtime. What changed:  how much to take   venlafaxine XR 75 MG 24 hr capsule Commonly known as:  EFFEXOR-XR Take 225 mg by mouth daily with breakfast.   vitamin B-12 100 MCG tablet Commonly known as:  CYANOCOBALAMIN Take 100 mcg by mouth daily.       Contact information for follow-up providers    Burnard Bunting, MD. Call in 1 day.   Specialty:  Internal Medicine Why:  and discuss your visit here and see when they want to see you in the office Contact  information: [place] [address] [phone]            Contact information for after-discharge care    Destination    [facility] .   Service:  Skilled Nursing Contact information: [address] [place] [phone]                  Consultations:  None   Procedures/Studies: 2D echo Study Conclusions - Left ventricle: The cavity size was normal. Wall thickness wasincreased in a pattern of moderate LVH. Systolic function wasnormal. The estimated ejection fraction was in the range of 60%to 65%. Wall motion was normal; there were no regional wallmotion abnormalities. Doppler parameters are consistent withabnormal eft ventricular relaxation (grade 1 diastolic dysfunction). Doppler parameters are consistent with indeterminate ventricular filling pressure. - Aortic valve: Transvalvular velocity was within the normal range.There was no stenosis. There was mild regurgitation. - Aorta: Ascending aortic diameter: 41 mm (S). - Ascending aorta: The ascending aorta was mildly dilated. - Mitral  valve: Transvalvular velocity was within the normal range.There was no evidence for stenosis. There was trivialregurgitation. - Left atrium: The atrium was mildly dilated. - Right ventricle: The cavity size was normal. Wall thickness wasnormal. Systolic function was normal. - Tricuspid valve: There was mild regurgitation. - Pulmonary arteries: Systolic pressure was within the normalrange. PA peak pressure: 19 mm Hg (S).   Dg Ankle Complete Left  Result Date: 02/26/2018 CLINICAL DATA:  Fall.  LEFT ankle pain tonight. EXAM: LEFT ANKLE COMPLETE - 3+ VIEW COMPARISON:  None. FINDINGS: No fracture deformity nor dislocation. Corticated bony fragment inferior to medial malleolus most compatible with old avulsion injury. The ankle mortise appears congruent and the tibiofibular syndesmosis intact. Small plantar  calcaneal spur. No destructive bony lesions. Soft tissue planes are non-suspicious. Mild vascular calcifications. IMPRESSION: No acute fracture deformity or dislocation. Electronically Signed   By: Elon Alas M.D.   On: 02/26/2018 03:07   Ct Head Wo Contrast  Result Date: 02/26/2018 CLINICAL DATA:  78 year old male with fall. EXAM: CT HEAD WITHOUT CONTRAST CT CERVICAL SPINE WITHOUT CONTRAST TECHNIQUE: Multidetector CT imaging of the head and cervical spine was performed following the standard protocol without intravenous contrast. Multiplanar CT image reconstructions of the cervical spine were also generated. COMPARISON:  Head CT dated 02/24/2018 FINDINGS: CT HEAD FINDINGS Brain: There is age-related atrophy and chronic microvascular ischemic changes. There is no acute intracranial hemorrhage. No mass effect or midline shift. No extra-axial fluid collection. Vascular: No hyperdense vessel or unexpected calcification. Skull: Normal. Negative for fracture or focal lesion. Sinuses/Orbits: No acute finding. Other: None CT CERVICAL SPINE FINDINGS Alignment: No acute subluxation. Grade 1 C7-T1 anterolisthesis. Skull base and vertebrae: No acute fracture. C4-C7 ACDF. Soft tissues and spinal canal: No prevertebral fluid or swelling. No visible canal hematoma. Disc levels: C5-C7 fusion. No acute findings. Multilevel facet hypertrophy most prominent at C2-C3 and C3-C4 on the right Upper chest: None Other: Bilateral carotid bulb calcified plaques with high-grade narrowing of the carotid artery on the left. IMPRESSION: 1. No acute intracranial hemorrhage. 2. Age-related atrophy and chronic microvascular ischemic changes. 3. No acute/traumatic cervical spine pathology. 4. C4-C7 ACDF and grade 1 C7-T1 anterolisthesis. 5. Bilateral carotid bulb calcified plaques with high-grade narrowing of the left carotid artery. Electronically Signed   By: Anner Crete M.D.   On: 02/26/2018 03:31   Ct Head Wo Contrast  Result  Date: 02/24/2018 CLINICAL DATA:  Headache EXAM: CT HEAD WITHOUT CONTRAST TECHNIQUE: Contiguous axial images were obtained from the base of the skull through the vertex without intravenous contrast. COMPARISON:  09/11/2015 FINDINGS: Brain: There is no mass, hemorrhage or extra-axial collection. There is generalized atrophy without lobar predilection. Hypodensity of the white matter is most commonly associated with chronic microvascular disease. Vascular: Atherosclerotic calcification of the internal carotid arteries at the skull base. No abnormal hyperdensity of the major intracranial arteries or dural venous sinuses. Skull: The visualized skull base, calvarium and extracranial soft tissues are normal. Sinuses/Orbits: No fluid levels or advanced mucosal thickening of the visualized paranasal sinuses. No mastoid or middle ear effusion. The orbits are normal. IMPRESSION: 1. No acute intracranial abnormality. 2. Atrophy and chronic microvascular ischemia. Electronically Signed   By: Ulyses Jarred M.D.   On: 02/24/2018 17:24   Ct Cervical Spine Wo Contrast  Result Date: 02/26/2018 CLINICAL DATA:  78 year old male with fall. EXAM: CT HEAD WITHOUT CONTRAST CT CERVICAL SPINE WITHOUT CONTRAST TECHNIQUE: Multidetector CT imaging of the head and cervical spine was performed following the standard  protocol without intravenous contrast. Multiplanar CT image reconstructions of the cervical spine were also generated. COMPARISON:  Head CT dated 02/24/2018 FINDINGS: CT HEAD FINDINGS Brain: There is age-related atrophy and chronic microvascular ischemic changes. There is no acute intracranial hemorrhage. No mass effect or midline shift. No extra-axial fluid collection. Vascular: No hyperdense vessel or unexpected calcification. Skull: Normal. Negative for fracture or focal lesion. Sinuses/Orbits: No acute finding. Other: None CT CERVICAL SPINE FINDINGS Alignment: No acute subluxation. Grade 1 C7-T1 anterolisthesis. Skull base and  vertebrae: No acute fracture. C4-C7 ACDF. Soft tissues and spinal canal: No prevertebral fluid or swelling. No visible canal hematoma. Disc levels: C5-C7 fusion. No acute findings. Multilevel facet hypertrophy most prominent at C2-C3 and C3-C4 on the right Upper chest: None Other: Bilateral carotid bulb calcified plaques with high-grade narrowing of the carotid artery on the left. IMPRESSION: 1. No acute intracranial hemorrhage. 2. Age-related atrophy and chronic microvascular ischemic changes. 3. No acute/traumatic cervical spine pathology. 4. C4-C7 ACDF and grade 1 C7-T1 anterolisthesis. 5. Bilateral carotid bulb calcified plaques with high-grade narrowing of the left carotid artery. Electronically Signed   By: Anner Crete M.D.   On: 02/26/2018 03:31   Ct Angio Chest/abd/pel For Dissection W And/or Wo Contrast  Result Date: 02/24/2018 CLINICAL DATA:  Back pain and constipation. Known abdominal aortic aneurysm. EXAM: CT ANGIOGRAPHY CHEST, ABDOMEN AND PELVIS TECHNIQUE: Multidetector CT imaging through the chest, abdomen and pelvis was performed using the standard protocol during bolus administration of intravenous contrast. Multiplanar reconstructed images and MIPs were obtained and reviewed to evaluate the vascular anatomy. CONTRAST:  142m ISOVUE-370 IOPAMIDOL (ISOVUE-370) INJECTION 76% COMPARISON:  08/19/2016 CT AP, CXR 02/15/2017 and 07/04/2015 FINDINGS: CTA CHEST FINDINGS Cardiovascular: Unenhanced images of the chest demonstrate aortic atherosclerosis without mural hematoma. After IV contrast, no aortic dissection is identified. Aneurysmal dilatation of the ascending thoracic aorta to 4.7 cm is identified at the level of the main pulmonary artery. Satisfactory opacification of the pulmonary arteries to the segmental level. No acute pulmonary embolus. Heart size is top normal without pericardial effusion or thickening. Left main, LAD and RCA coronary arteriosclerosis is noted. Pacemaker apparatus  projects over the left upper anterior chest wall with leads in the right atrium and right ventricle. Mediastinum/Nodes: No axillary, supraclavicular, mediastinal nor hilar lymphadenopathy. Retrosternal extension of the thyroid gland without dominant mass is noted. The CT appearance of the thoracic esophagus is unremarkable. The trachea is midline and mainstem bronchi appear patent. No intraluminal abnormalities are identified within. Lungs/Pleura: Lungs are clear. No pleural effusion or pneumothorax. Musculoskeletal: ACDF of the included lower cervical spine. Chronic mild-to-moderate anterior compression fracture of T6 without retropulsion. Mild thoracic spondylosis. Osteoarthritis of the included glenohumeral joint on the right. No acute nor suspicious osseous abnormalities. Review of the MIP images confirms the above findings. CTA ABDOMEN AND PELVIS FINDINGS VASCULAR Aorta: Infrarenal 3.3 cm fusiform abdominal aortic aneurysm with soft and hard plaque along the anterior and lateral walls bilaterally. No dissection is identified. Celiac: Patent without evidence of aneurysm, dissection, vasculitis or significant stenosis. Atherosclerosis at the origin without significant stenosis. SMA: Patent without evidence of aneurysm, dissection, vasculitis or significant stenosis. Renals: Patent bilateral renal artery stents are noted without significant stenosis. No aneurysm, dissection or evidence of vasculitis. No conclusive findings for fibromuscular dysplasia. IMA: Patent Inflow: Patent without evidence of aneurysm, dissection, vasculitis or significant stenosis. Veins: No obvious venous abnormality within the limitations of this arterial phase study. Review of the MIP images confirms the above findings. NON-VASCULAR  Hepatobiliary: Subtle nodularity of the liver surface compatible with morphologic changes of cirrhosis. No space-occupying mass. The gallbladder is physiologically distended without stones. Pancreas: Normal  Spleen: Normal Adrenals/Urinary Tract: Normal bilateral adrenal glands. Chronic renal cortical scarring and atrophy of the right kidney with bilateral low attenuating lesions compatible with renal cysts, some of which are too small to further characterize. Redemonstration of a 12 mm indeterminate interpolar lesion off the right kidney without significant change in size and attenuation. On prior CT, findings were consistent with a Bosniak category 2 cyst, possibly hemorrhagic. No significant interval progression in size or development of new change. No obstructive uropathy. The urinary bladder is unremarkable for the degree of distention. Stomach/Bowel: Normal appendix is identified. The stomach, duodenal sweep, ligament of Treitz and small intestine are unremarkable. The distal and terminal ileum are within normal limits. Average stool retention is seen within the colon with scattered colonic diverticulosis more so along the sigmoid colon. No acute diverticulitis. Lymphatic: No pelvic sidewall, inguinal, mesenteric, or retroperitoneal adenopathy. Small porta hepatic lymph node measuring 8 mm short axis is noted. Reproductive: Enlarged prostate measuring approximately 6.2 x 5.4 x 56.4 cm impressing upon the base of the bladder is again demonstrated. Unremarkable seminal vesicles. Other: No free air nor free fluid. Musculoskeletal: Lumbar spondylosis with degenerative disc disease L2 through L5. Moderate broad-based disc bulge at L3-4 contributing to mild canal stenosis. Mild disc bulge at L4-5 impressing flattening the ventral aspect of the thecal sac. No aggressive osseous lesions. Review of the MIP images confirms the above findings. IMPRESSION: Chest CT: 1. Aneurysmal dilatation of the ascending thoracic aorta to 4.7 cm without dissection. 2. No acute pulmonary embolus. 3. Left main, LAD and RCA coronary arteriosclerosis. 4. No active pulmonary disease. Vascular: 1. Infrarenal fusiform abdominal aortic aneurysm  unchanged in appearance measuring up to 3.3 cm. No dissection noted. 2. Bilateral renal artery stents, patent without significant stenosis. CT AP: 1. Stable Bosniak category 2 lesion off the interpolar aspect of the right kidney measuring 12 mm. No significant interval progression in size or change. Additional more simple appearing cysts are identified bilaterally. 2. Prostatomegaly 3. Subtle surface nodularity of the liver suggesting morphologic changes of cirrhosis without space-occupying mass. No ascites. 4. Broad-based disc bulge L3-4 contributing to mild central canal stenosis. Mild disc bulge at L4-5. 5. No acute nor aggressive osseous lesions. 6. Colonic diverticulosis without acute diverticulitis. Electronically Signed   By: Ashley Royalty M.D.   On: 02/24/2018 14:06     Subjective: - no chest pain, shortness of breath, no abdominal pain, nausea or vomiting.   Discharge Exam: Vitals:   02/27/18 2041 02/28/18 0514  BP: 136/86 106/63  Pulse: 72 65  Resp: 19 16  Temp: 98 F (36.7 C) 98.2 F (36.8 C)  SpO2: 96% 96%    General: Pt is alert, awake, not in acute distress Cardiovascular: regular Respiratory: CTA bilaterally, no wheezing, no rhonchi    The results of significant diagnostics from this hospitalization (including imaging, microbiology, ancillary and laboratory) are listed below for reference.     Microbiology: No results found for this or any previous visit (from the past 240 hour(s)).   Labs: BNP (last 3 results) No results for input(s): BNP in the last 8760 hours. Basic Metabolic Panel: Recent Labs  Lab 02/24/18 1121 02/24/18 1148 02/25/18 0725  NA 137  --  136  K 3.9  --  4.9  CL 99  --  104  CO2 27  --  24  GLUCOSE 119*  --  100*  BUN 8  --  11  CREATININE 0.92 0.70 0.94  CALCIUM 8.9  --  8.9   Liver Function Tests: Recent Labs  Lab 02/24/18 1121 02/25/18 0725  AST 20 24  ALT 22 16  ALKPHOS 65 57  BILITOT 0.6 1.3*  PROT 6.8 6.3*  ALBUMIN 3.8  3.7   Recent Labs  Lab 02/24/18 1121  LIPASE 23   No results for input(s): AMMONIA in the last 168 hours. CBC: Recent Labs  Lab 02/24/18 1121 02/25/18 0956  WBC 8.8 10.2  NEUTROABS 6.9  --   HGB 13.3 12.7*  HCT 40.6 37.5*  MCV 91.2 90.6  PLT 198 210   Cardiac Enzymes: Recent Labs  Lab 02/24/18 2216 02/25/18 0725 02/25/18 1301  TROPONINI <0.03 <0.03 <0.03   BNP: Invalid input(s): POCBNP CBG: No results for input(s): GLUCAP in the last 168 hours. D-Dimer No results for input(s): DDIMER in the last 72 hours. Hgb A1c No results for input(s): HGBA1C in the last 72 hours. Lipid Profile No results for input(s): CHOL, HDL, LDLCALC, TRIG, CHOLHDL, LDLDIRECT in the last 72 hours. Thyroid function studies No results for input(s): TSH, T4TOTAL, T3FREE, THYROIDAB in the last 72 hours.  Invalid input(s): FREET3 Anemia work up No results for input(s): VITAMINB12, FOLATE, FERRITIN, TIBC, IRON, RETICCTPCT in the last 72 hours. Urinalysis    Component Value Date/Time   COLORURINE COLORLESS (A) 02/24/2018 1539   APPEARANCEUR CLEAR 02/24/2018 1539   LABSPEC 1.025 02/24/2018 1539   PHURINE 8.0 02/24/2018 1539   GLUCOSEU NEGATIVE 02/24/2018 1539   HGBUR NEGATIVE 02/24/2018 1539   BILIRUBINUR NEGATIVE 02/24/2018 [place] 02/24/2018 1539   PROTEINUR NEGATIVE 02/24/2018 1539   UROBILINOGEN 0.2 05/03/2013 1327   NITRITE NEGATIVE 02/24/2018 1539   LEUKOCYTESUR NEGATIVE 02/24/2018 1539   Sepsis Labs Invalid input(s): PROCALCITONIN,  WBC,  LACTICIDVEN   Time coordinating discharge: 35 minutes  SIGNED:  Marzetta Board, MD  [facility] 02/28/2018, 6:56 AM

## 2018-02-28 NOTE — Progress Notes (Signed)
Patient will DC to: [place] Date: 02/28/2018 Family Notified: Rosana Hoes, son Transport By: Corey Harold at 10:30 am   RN, patient, and facility notified of DC. Discharge Summary sent to facility. RN given number for [phone]. DC packet on chart. Ambulance transport requested for patient.   Clinical Social Worker signing off.  Thurmond Butts, [place] Social Worker [phone]

## 2018-03-02 ENCOUNTER — Encounter: Payer: Self-pay | Admitting: Internal Medicine

## 2018-03-02 ENCOUNTER — Non-Acute Institutional Stay (SKILLED_NURSING_FACILITY): Payer: Medicare Other | Admitting: Internal Medicine

## 2018-03-02 DIAGNOSIS — I495 Sick sinus syndrome: Secondary | ICD-10-CM | POA: Diagnosis not present

## 2018-03-02 DIAGNOSIS — I471 Supraventricular tachycardia: Secondary | ICD-10-CM | POA: Diagnosis not present

## 2018-03-02 DIAGNOSIS — K219 Gastro-esophageal reflux disease without esophagitis: Secondary | ICD-10-CM | POA: Diagnosis not present

## 2018-03-02 DIAGNOSIS — E782 Mixed hyperlipidemia: Secondary | ICD-10-CM | POA: Diagnosis not present

## 2018-03-02 DIAGNOSIS — F329 Major depressive disorder, single episode, unspecified: Secondary | ICD-10-CM

## 2018-03-02 DIAGNOSIS — I160 Hypertensive urgency: Secondary | ICD-10-CM | POA: Diagnosis not present

## 2018-03-02 DIAGNOSIS — N400 Enlarged prostate without lower urinary tract symptoms: Secondary | ICD-10-CM

## 2018-03-02 DIAGNOSIS — G629 Polyneuropathy, unspecified: Secondary | ICD-10-CM | POA: Diagnosis not present

## 2018-03-02 DIAGNOSIS — G9340 Encephalopathy, unspecified: Secondary | ICD-10-CM

## 2018-03-02 DIAGNOSIS — F4310 Post-traumatic stress disorder, unspecified: Secondary | ICD-10-CM | POA: Diagnosis not present

## 2018-03-02 DIAGNOSIS — F32A Depression, unspecified: Secondary | ICD-10-CM

## 2018-03-02 DIAGNOSIS — F419 Anxiety disorder, unspecified: Secondary | ICD-10-CM
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
## 2018-03-02 NOTE — Consult Note (Signed)
            [facility] CM Primary Care Navigator  03/02/2018  Kevin Wiley 12/17/1940 [id number]   Attempt to see patientat the bedside to identify possible discharge needs buthe wasalready dischargedto skilled nursing facility over the weekend (SNF- [facility]).  Per MD note,patientpresented with complaints of abdominal pain and was admitted for further work-up of abdominal pain as well as acute hypertensive urgency.(acute encephalopathy, hyperlipidemia, polyneuropathy)  Patient has discharge instruction to follow-upwith primary care provider in 1- 2 weeks after discharge.  Primary care provider's office is listed as providing transition of care (TOC) follow-up.   For additional questions please contact:  Edwena Felty A. [place] Mandato, BSN, RN-BC [facility] PRIMARY CARE Navigator Cell: [phone]

## 2018-03-03 ENCOUNTER — Other Ambulatory Visit: Payer: Self-pay | Admitting: Internal Medicine

## 2018-03-03 DIAGNOSIS — G9340 Encephalopathy, unspecified: Secondary | ICD-10-CM | POA: Insufficient documentation

## 2018-03-03 DIAGNOSIS — N400 Enlarged prostate without lower urinary tract symptoms: Secondary | ICD-10-CM | POA: Insufficient documentation

## 2018-03-03 MED ORDER — ZOLPIDEM TARTRATE 10 MG PO TABS: 10.0000 mg | ORAL_TABLET | Freq: Every day | ORAL | 0 refills | Status: DC

## 2018-03-04 LAB — CBC AND DIFFERENTIAL
HCT: 36 — AB (ref 41–53)
HEMATOCRIT: 36 — AB (ref 41–53)
HEMOGLOBIN: 12.4 — AB (ref 13.5–17.5)
Hemoglobin: 12.4 — AB (ref 13.5–17.5)
Platelets: 183 (ref 150–399)
Platelets: 183 (ref 150–399)
WBC: 6.5
WBC: 6.5

## 2018-03-04 LAB — BASIC METABOLIC PANEL
BUN: 16 (ref 4–21)
Creatinine: 0.8 (ref 0.6–1.3)
Glucose: 96
Potassium: 4.7 (ref 3.4–5.3)
SODIUM: 141 (ref 137–147)

## 2018-03-05 ENCOUNTER — Other Ambulatory Visit: Payer: Self-pay | Admitting: *Deleted

## 2018-03-06 NOTE — Patient Outreach (Signed)
[facility]) Care Management  03/06/2018  Kevin Wiley [birth year] [id number]   Attended interdisciplinary team meeting at [facility] with Marita Kansas SW/D/C planner to discuss patient's progress and plan for transitioning to home. Patient admitted to SNF  On 02/28/18 after a brief hospitalization. Patient doing well in therapy.  No discharge date set, next review in 5 days.  Patient lives with spouse.  Patient is followed by the [facility] for services. Marita Kansas recommends [facility] CM services be offered to patient.   Went to patient's bedside.  Patient resting in bed.  Patient stated he receives all his medications from the [place].  Patient stated he does all the driving for he and his spouse.  Patient stated his blood pressure had gotten really high and this was the reason he had to go to the hospital.  Introduced [facility] CM services.  Patient denied need for [facility] services at this time.  [facility] contact information left at bedside and made patient aware he could contact if he felt his needs changed.   Rutherford Limerick RN, BSN [facility] Acute Care Coordinator [phone]) Business Mobile [phone]) Toll free office

## 2018-03-16 ENCOUNTER — Non-Acute Institutional Stay (SKILLED_NURSING_FACILITY): Payer: Medicare Other

## 2018-03-16 ENCOUNTER — Non-Acute Institutional Stay (SKILLED_NURSING_FACILITY): Payer: Medicare Other | Admitting: Internal Medicine

## 2018-03-16 DIAGNOSIS — E785 Hyperlipidemia, unspecified: Secondary | ICD-10-CM

## 2018-03-16 DIAGNOSIS — N400 Enlarged prostate without lower urinary tract symptoms: Secondary | ICD-10-CM

## 2018-03-16 DIAGNOSIS — G629 Polyneuropathy, unspecified: Secondary | ICD-10-CM

## 2018-03-16 DIAGNOSIS — G9340 Encephalopathy, unspecified: Secondary | ICD-10-CM

## 2018-03-16 DIAGNOSIS — I495 Sick sinus syndrome: Secondary | ICD-10-CM | POA: Diagnosis not present

## 2018-03-16 DIAGNOSIS — I160 Hypertensive urgency: Secondary | ICD-10-CM | POA: Diagnosis not present

## 2018-03-16 DIAGNOSIS — K219 Gastro-esophageal reflux disease without esophagitis: Secondary | ICD-10-CM | POA: Diagnosis not present

## 2018-03-16 DIAGNOSIS — E782 Mixed hyperlipidemia: Secondary | ICD-10-CM

## 2018-03-16 DIAGNOSIS — F419 Anxiety disorder, unspecified: Secondary | ICD-10-CM

## 2018-03-16 DIAGNOSIS — F4310 Post-traumatic stress disorder, unspecified: Secondary | ICD-10-CM

## 2018-03-16 DIAGNOSIS — F329 Major depressive disorder, single episode, unspecified: Secondary | ICD-10-CM

## 2018-03-16 DIAGNOSIS — I471 Supraventricular tachycardia: Secondary | ICD-10-CM

## 2018-03-16 DIAGNOSIS — F32A Depression, unspecified: Secondary | ICD-10-CM

## 2018-03-16 DIAGNOSIS — I10 Essential (primary) hypertension: Secondary | ICD-10-CM

## 2018-03-16 MED ORDER — TAMSULOSIN HCL 0.4 MG PO CAPS: 0.4000 mg | ORAL_CAPSULE | Freq: Every day | ORAL | 0 refills | Status: AC

## 2018-03-16 MED ORDER — TRAZODONE HCL 100 MG PO TABS: 100.0000 mg | ORAL_TABLET | Freq: Every day | ORAL | 0 refills | Status: DC

## 2018-03-16 MED ORDER — BUSPIRONE HCL 10 MG PO TABS: 5.0000 mg | ORAL_TABLET | Freq: Two times a day (BID) | ORAL | 0 refills | Status: DC

## 2018-03-16 MED ORDER — ATORVASTATIN CALCIUM 80 MG PO TABS: 80.0000 mg | ORAL_TABLET | Freq: Every day | ORAL | 0 refills | Status: DC

## 2018-03-16 MED ORDER — ATENOLOL 50 MG PO TABS: 100.0000 mg | ORAL_TABLET | Freq: Every day | ORAL | 0 refills | Status: DC

## 2018-03-16 MED ORDER — ZOLPIDEM TARTRATE 10 MG PO TABS: 10.0000 mg | ORAL_TABLET | Freq: Every day | ORAL | 0 refills | Status: DC

## 2018-03-16 MED ORDER — VENLAFAXINE HCL ER 75 MG PO CP24: 225.0000 mg | ORAL_CAPSULE | Freq: Every day | ORAL | 0 refills | Status: DC

## 2018-03-16 MED ORDER — NITROGLYCERIN 0.4 MG SL SUBL: 0.4000 mg | SUBLINGUAL_TABLET | SUBLINGUAL | 0 refills | Status: DC | PRN

## 2018-03-16 MED ORDER — CLONIDINE 0.1 MG/24HR TD PTWK: 0.1000 mg | MEDICATED_PATCH | TRANSDERMAL | 0 refills | Status: DC

## 2018-03-16 MED ORDER — CYCLOBENZAPRINE HCL 10 MG PO TABS: 10.0000 mg | ORAL_TABLET | Freq: Every day | ORAL | 0 refills | Status: DC | PRN

## 2018-03-16 MED ORDER — GABAPENTIN 800 MG PO TABS: 800.0000 mg | ORAL_TABLET | Freq: Three times a day (TID) | ORAL | 0 refills | Status: DC

## 2018-03-16 MED ORDER — OMEPRAZOLE 20 MG PO CPDR: 20.0000 mg | DELAYED_RELEASE_CAPSULE | Freq: Two times a day (BID) | ORAL | 0 refills | Status: DC

## 2018-03-16 MED ORDER — HYDRALAZINE HCL 50 MG PO TABS: 50.0000 mg | ORAL_TABLET | Freq: Three times a day (TID) | ORAL | 0 refills | Status: DC
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
## 2018-03-19 DIAGNOSIS — G6289 Other specified polyneuropathies: Secondary | ICD-10-CM | POA: Diagnosis not present

## 2018-03-19 DIAGNOSIS — G9341 Metabolic encephalopathy: Secondary | ICD-10-CM | POA: Diagnosis not present

## 2018-03-19 DIAGNOSIS — F4310 Post-traumatic stress disorder, unspecified: Secondary | ICD-10-CM | POA: Diagnosis not present

## 2018-03-19 DIAGNOSIS — I7389 Other specified peripheral vascular diseases: Secondary | ICD-10-CM | POA: Diagnosis not present

## 2018-03-19 DIAGNOSIS — I701 Atherosclerosis of renal artery: Secondary | ICD-10-CM | POA: Diagnosis not present

## 2018-03-19 DIAGNOSIS — I2510 Atherosclerotic heart disease of native coronary artery without angina pectoris: Secondary | ICD-10-CM | POA: Diagnosis not present

## 2018-03-19 DIAGNOSIS — M533 Sacrococcygeal disorders, not elsewhere classified: Secondary | ICD-10-CM | POA: Diagnosis not present

## 2018-03-19 DIAGNOSIS — F329 Major depressive disorder, single episode, unspecified: Secondary | ICD-10-CM | POA: Diagnosis not present

## 2018-03-19 DIAGNOSIS — I739 Peripheral vascular disease, unspecified: Secondary | ICD-10-CM | POA: Diagnosis not present

## 2018-03-19 DIAGNOSIS — I441 Atrioventricular block, second degree: Secondary | ICD-10-CM | POA: Diagnosis not present

## 2018-03-19 DIAGNOSIS — I480 Paroxysmal atrial fibrillation: Secondary | ICD-10-CM | POA: Diagnosis not present

## 2018-03-19 DIAGNOSIS — I495 Sick sinus syndrome: Secondary | ICD-10-CM | POA: Diagnosis not present

## 2018-03-19 DIAGNOSIS — Z9181 History of falling: Secondary | ICD-10-CM | POA: Diagnosis not present

## 2018-03-19 DIAGNOSIS — I160 Hypertensive urgency: Secondary | ICD-10-CM | POA: Diagnosis not present

## 2018-03-19 DIAGNOSIS — Z87891 Personal history of nicotine dependence: Secondary | ICD-10-CM | POA: Diagnosis not present

## 2018-03-19 DIAGNOSIS — G629 Polyneuropathy, unspecified: Secondary | ICD-10-CM | POA: Diagnosis not present

## 2018-03-19 DIAGNOSIS — I10 Essential (primary) hypertension: Secondary | ICD-10-CM | POA: Diagnosis not present

## 2018-03-19 DIAGNOSIS — F3289 Other specified depressive episodes: Secondary | ICD-10-CM | POA: Diagnosis not present

## 2018-03-20 DIAGNOSIS — I2510 Atherosclerotic heart disease of native coronary artery without angina pectoris: Secondary | ICD-10-CM | POA: Diagnosis not present

## 2018-03-20 DIAGNOSIS — M12852 Other specific arthropathies, not elsewhere classified, left hip: Secondary | ICD-10-CM | POA: Diagnosis not present

## 2018-03-20 DIAGNOSIS — I10 Essential (primary) hypertension: Secondary | ICD-10-CM | POA: Diagnosis not present

## 2018-03-20 DIAGNOSIS — Z6825 Body mass index (BMI) 25.0-25.9, adult: Secondary | ICD-10-CM | POA: Diagnosis not present

## 2018-03-20 DIAGNOSIS — E7849 Other hyperlipidemia: Secondary | ICD-10-CM | POA: Diagnosis not present

## 2018-03-20 DIAGNOSIS — I701 Atherosclerosis of renal artery: Secondary | ICD-10-CM | POA: Diagnosis not present

## 2018-03-20 DIAGNOSIS — G9341 Metabolic encephalopathy: Secondary | ICD-10-CM | POA: Diagnosis not present

## 2018-03-23 DIAGNOSIS — I2510 Atherosclerotic heart disease of native coronary artery without angina pectoris: Secondary | ICD-10-CM | POA: Diagnosis not present

## 2018-03-23 DIAGNOSIS — G9341 Metabolic encephalopathy: Secondary | ICD-10-CM | POA: Diagnosis not present

## 2018-03-23 DIAGNOSIS — G629 Polyneuropathy, unspecified: Secondary | ICD-10-CM | POA: Diagnosis not present

## 2018-03-23 DIAGNOSIS — F329 Major depressive disorder, single episode, unspecified: Secondary | ICD-10-CM | POA: Diagnosis not present

## 2018-03-23 DIAGNOSIS — I10 Essential (primary) hypertension: Secondary | ICD-10-CM | POA: Diagnosis not present

## 2018-03-23 DIAGNOSIS — I160 Hypertensive urgency: Secondary | ICD-10-CM | POA: Diagnosis not present

## 2018-03-25 ENCOUNTER — Encounter: Payer: Self-pay | Admitting: Internal Medicine

## 2018-03-25 DIAGNOSIS — G9341 Metabolic encephalopathy: Secondary | ICD-10-CM | POA: Diagnosis not present

## 2018-03-25 DIAGNOSIS — I160 Hypertensive urgency: Secondary | ICD-10-CM | POA: Diagnosis not present

## 2018-03-25 DIAGNOSIS — F329 Major depressive disorder, single episode, unspecified: Secondary | ICD-10-CM | POA: Diagnosis not present

## 2018-03-25 DIAGNOSIS — I2510 Atherosclerotic heart disease of native coronary artery without angina pectoris: Secondary | ICD-10-CM | POA: Diagnosis not present

## 2018-03-25 DIAGNOSIS — I10 Essential (primary) hypertension: Secondary | ICD-10-CM | POA: Diagnosis not present

## 2018-03-25 DIAGNOSIS — G629 Polyneuropathy, unspecified: Secondary | ICD-10-CM | POA: Diagnosis not present
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
## 2018-03-25 NOTE — Progress Notes (Signed)
This encounter was created in error - please disregard.

## 2018-03-25 NOTE — Addendum Note (Signed)
Addended by: Inocencio Homes D on: 03/25/2018 01:16 PM   Modules accepted: Level of Service, SmartSet

## 2018-03-26 DIAGNOSIS — F329 Major depressive disorder, single episode, unspecified: Secondary | ICD-10-CM | POA: Diagnosis not present

## 2018-03-26 DIAGNOSIS — I2510 Atherosclerotic heart disease of native coronary artery without angina pectoris: Secondary | ICD-10-CM | POA: Diagnosis not present

## 2018-03-26 DIAGNOSIS — I10 Essential (primary) hypertension: Secondary | ICD-10-CM | POA: Diagnosis not present

## 2018-03-26 DIAGNOSIS — G629 Polyneuropathy, unspecified: Secondary | ICD-10-CM | POA: Diagnosis not present

## 2018-03-26 DIAGNOSIS — G9341 Metabolic encephalopathy: Secondary | ICD-10-CM | POA: Diagnosis not present

## 2018-03-26 DIAGNOSIS — I160 Hypertensive urgency: Secondary | ICD-10-CM | POA: Diagnosis not present

## 2018-03-27 DIAGNOSIS — F329 Major depressive disorder, single episode, unspecified: Secondary | ICD-10-CM | POA: Diagnosis not present

## 2018-03-27 DIAGNOSIS — G629 Polyneuropathy, unspecified: Secondary | ICD-10-CM | POA: Diagnosis not present

## 2018-03-27 DIAGNOSIS — I2510 Atherosclerotic heart disease of native coronary artery without angina pectoris: Secondary | ICD-10-CM | POA: Diagnosis not present

## 2018-03-27 DIAGNOSIS — I160 Hypertensive urgency: Secondary | ICD-10-CM | POA: Diagnosis not present

## 2018-03-27 DIAGNOSIS — G9341 Metabolic encephalopathy: Secondary | ICD-10-CM | POA: Diagnosis not present

## 2018-03-27 DIAGNOSIS — I10 Essential (primary) hypertension: Secondary | ICD-10-CM | POA: Diagnosis not present

## 2018-03-28 ENCOUNTER — Encounter: Payer: Self-pay | Admitting: Internal Medicine

## 2018-03-30 DIAGNOSIS — I160 Hypertensive urgency: Secondary | ICD-10-CM | POA: Diagnosis not present

## 2018-03-30 DIAGNOSIS — G9341 Metabolic encephalopathy: Secondary | ICD-10-CM | POA: Diagnosis not present

## 2018-03-30 DIAGNOSIS — I2510 Atherosclerotic heart disease of native coronary artery without angina pectoris: Secondary | ICD-10-CM | POA: Diagnosis not present

## 2018-03-30 DIAGNOSIS — G629 Polyneuropathy, unspecified: Secondary | ICD-10-CM | POA: Diagnosis not present

## 2018-03-30 DIAGNOSIS — F329 Major depressive disorder, single episode, unspecified: Secondary | ICD-10-CM | POA: Diagnosis not present

## 2018-03-30 DIAGNOSIS — I10 Essential (primary) hypertension: Secondary | ICD-10-CM | POA: Diagnosis not present

## 2018-03-31 DIAGNOSIS — G629 Polyneuropathy, unspecified: Secondary | ICD-10-CM | POA: Diagnosis not present

## 2018-03-31 DIAGNOSIS — I10 Essential (primary) hypertension: Secondary | ICD-10-CM | POA: Diagnosis not present

## 2018-03-31 DIAGNOSIS — I160 Hypertensive urgency: Secondary | ICD-10-CM | POA: Diagnosis not present

## 2018-03-31 DIAGNOSIS — G9341 Metabolic encephalopathy: Secondary | ICD-10-CM | POA: Diagnosis not present

## 2018-03-31 DIAGNOSIS — F329 Major depressive disorder, single episode, unspecified: Secondary | ICD-10-CM | POA: Diagnosis not present

## 2018-03-31 DIAGNOSIS — I2510 Atherosclerotic heart disease of native coronary artery without angina pectoris: Secondary | ICD-10-CM | POA: Diagnosis not present

## 2018-04-08 DIAGNOSIS — G629 Polyneuropathy, unspecified: Secondary | ICD-10-CM | POA: Diagnosis not present

## 2018-04-08 DIAGNOSIS — F329 Major depressive disorder, single episode, unspecified: Secondary | ICD-10-CM | POA: Diagnosis not present

## 2018-04-08 DIAGNOSIS — I160 Hypertensive urgency: Secondary | ICD-10-CM | POA: Diagnosis not present

## 2018-04-08 DIAGNOSIS — I2510 Atherosclerotic heart disease of native coronary artery without angina pectoris: Secondary | ICD-10-CM | POA: Diagnosis not present

## 2018-04-08 DIAGNOSIS — G9341 Metabolic encephalopathy: Secondary | ICD-10-CM | POA: Diagnosis not present

## 2018-04-08 DIAGNOSIS — I10 Essential (primary) hypertension: Secondary | ICD-10-CM | POA: Diagnosis not present

## 2018-04-09 DIAGNOSIS — M533 Sacrococcygeal disorders, not elsewhere classified: Secondary | ICD-10-CM | POA: Diagnosis not present

## 2018-04-10 DIAGNOSIS — G629 Polyneuropathy, unspecified: Secondary | ICD-10-CM | POA: Diagnosis not present

## 2018-04-10 DIAGNOSIS — F329 Major depressive disorder, single episode, unspecified: Secondary | ICD-10-CM | POA: Diagnosis not present

## 2018-04-10 DIAGNOSIS — G9341 Metabolic encephalopathy: Secondary | ICD-10-CM | POA: Diagnosis not present

## 2018-04-10 DIAGNOSIS — I160 Hypertensive urgency: Secondary | ICD-10-CM | POA: Diagnosis not present

## 2018-04-10 DIAGNOSIS — I2510 Atherosclerotic heart disease of native coronary artery without angina pectoris: Secondary | ICD-10-CM | POA: Diagnosis not present

## 2018-04-10 DIAGNOSIS — I10 Essential (primary) hypertension: Secondary | ICD-10-CM | POA: Diagnosis not present

## 2018-04-15 DIAGNOSIS — G629 Polyneuropathy, unspecified: Secondary | ICD-10-CM | POA: Diagnosis not present

## 2018-04-15 DIAGNOSIS — G9341 Metabolic encephalopathy: Secondary | ICD-10-CM | POA: Diagnosis not present

## 2018-04-15 DIAGNOSIS — I10 Essential (primary) hypertension: Secondary | ICD-10-CM | POA: Diagnosis not present

## 2018-04-15 DIAGNOSIS — I160 Hypertensive urgency: Secondary | ICD-10-CM | POA: Diagnosis not present

## 2018-04-15 DIAGNOSIS — F329 Major depressive disorder, single episode, unspecified: Secondary | ICD-10-CM | POA: Diagnosis not present

## 2018-04-15 DIAGNOSIS — I2510 Atherosclerotic heart disease of native coronary artery without angina pectoris: Secondary | ICD-10-CM | POA: Diagnosis not present

## 2018-04-17 DIAGNOSIS — F329 Major depressive disorder, single episode, unspecified: Secondary | ICD-10-CM | POA: Diagnosis not present

## 2018-04-17 DIAGNOSIS — I10 Essential (primary) hypertension: Secondary | ICD-10-CM | POA: Diagnosis not present

## 2018-04-17 DIAGNOSIS — G9341 Metabolic encephalopathy: Secondary | ICD-10-CM | POA: Diagnosis not present

## 2018-04-17 DIAGNOSIS — I2510 Atherosclerotic heart disease of native coronary artery without angina pectoris: Secondary | ICD-10-CM | POA: Diagnosis not present

## 2018-04-17 DIAGNOSIS — I160 Hypertensive urgency: Secondary | ICD-10-CM | POA: Diagnosis not present

## 2018-04-17 DIAGNOSIS — G629 Polyneuropathy, unspecified: Secondary | ICD-10-CM | POA: Diagnosis not present

## 2018-04-18 DIAGNOSIS — I10 Essential (primary) hypertension: Secondary | ICD-10-CM | POA: Diagnosis not present

## 2018-04-18 DIAGNOSIS — F4310 Post-traumatic stress disorder, unspecified: Secondary | ICD-10-CM | POA: Diagnosis not present

## 2018-04-18 DIAGNOSIS — Z87891 Personal history of nicotine dependence: Secondary | ICD-10-CM | POA: Diagnosis not present

## 2018-04-18 DIAGNOSIS — I701 Atherosclerosis of renal artery: Secondary | ICD-10-CM | POA: Diagnosis not present

## 2018-04-18 DIAGNOSIS — I739 Peripheral vascular disease, unspecified: Secondary | ICD-10-CM | POA: Diagnosis not present

## 2018-04-18 DIAGNOSIS — G629 Polyneuropathy, unspecified: Secondary | ICD-10-CM | POA: Diagnosis not present

## 2018-04-18 DIAGNOSIS — I160 Hypertensive urgency: Secondary | ICD-10-CM | POA: Diagnosis not present

## 2018-04-18 DIAGNOSIS — I480 Paroxysmal atrial fibrillation: Secondary | ICD-10-CM | POA: Diagnosis not present

## 2018-04-18 DIAGNOSIS — G9341 Metabolic encephalopathy: Secondary | ICD-10-CM | POA: Diagnosis not present

## 2018-04-18 DIAGNOSIS — F329 Major depressive disorder, single episode, unspecified: Secondary | ICD-10-CM | POA: Diagnosis not present

## 2018-04-18 DIAGNOSIS — I441 Atrioventricular block, second degree: Secondary | ICD-10-CM | POA: Diagnosis not present

## 2018-04-18 DIAGNOSIS — Z9181 History of falling: Secondary | ICD-10-CM | POA: Diagnosis not present

## 2018-04-18 DIAGNOSIS — I495 Sick sinus syndrome: Secondary | ICD-10-CM | POA: Diagnosis not present

## 2018-04-18 DIAGNOSIS — I2510 Atherosclerotic heart disease of native coronary artery without angina pectoris: Secondary | ICD-10-CM | POA: Diagnosis not present

## 2018-04-21 DIAGNOSIS — G9341 Metabolic encephalopathy: Secondary | ICD-10-CM | POA: Diagnosis not present

## 2018-04-21 DIAGNOSIS — I160 Hypertensive urgency: Secondary | ICD-10-CM | POA: Diagnosis not present

## 2018-04-21 DIAGNOSIS — G629 Polyneuropathy, unspecified: Secondary | ICD-10-CM | POA: Diagnosis not present

## 2018-04-21 DIAGNOSIS — F329 Major depressive disorder, single episode, unspecified: Secondary | ICD-10-CM | POA: Diagnosis not present

## 2018-04-21 DIAGNOSIS — I10 Essential (primary) hypertension: Secondary | ICD-10-CM | POA: Diagnosis not present

## 2018-04-21 DIAGNOSIS — I2510 Atherosclerotic heart disease of native coronary artery without angina pectoris: Secondary | ICD-10-CM | POA: Diagnosis not present

## 2018-04-24 DIAGNOSIS — I2510 Atherosclerotic heart disease of native coronary artery without angina pectoris: Secondary | ICD-10-CM | POA: Diagnosis not present

## 2018-04-24 DIAGNOSIS — I160 Hypertensive urgency: Secondary | ICD-10-CM | POA: Diagnosis not present

## 2018-04-24 DIAGNOSIS — G9341 Metabolic encephalopathy: Secondary | ICD-10-CM | POA: Diagnosis not present

## 2018-04-24 DIAGNOSIS — I10 Essential (primary) hypertension: Secondary | ICD-10-CM | POA: Diagnosis not present

## 2018-04-24 DIAGNOSIS — F329 Major depressive disorder, single episode, unspecified: Secondary | ICD-10-CM | POA: Diagnosis not present

## 2018-04-24 DIAGNOSIS — G629 Polyneuropathy, unspecified: Secondary | ICD-10-CM | POA: Diagnosis not present

## 2018-05-07 ENCOUNTER — Other Ambulatory Visit: Payer: Self-pay | Admitting: Internal Medicine

## 2018-06-11 ENCOUNTER — Telehealth: Payer: Self-pay | Admitting: Cardiology

## 2018-06-11 NOTE — Telephone Encounter (Signed)
Spoke w/ Kevin Wiley and requested that he send a remote transmission w/ his home monitor b/c [facility] wanted him to send in December and we have not received a transmission. Kevin Wiley stated that he doesn't feel well and he won't send it today but he will try and send when he feels better.

## 2018-06-14 ENCOUNTER — Other Ambulatory Visit: Payer: Self-pay | Admitting: Internal Medicine

## 2018-06-26 DIAGNOSIS — M19011 Primary osteoarthritis, right shoulder: Secondary | ICD-10-CM | POA: Diagnosis not present

## 2018-06-26 DIAGNOSIS — M19019 Primary osteoarthritis, unspecified shoulder: Secondary | ICD-10-CM | POA: Diagnosis not present

## 2018-09-07 ENCOUNTER — Other Ambulatory Visit (HOSPITAL_COMMUNITY): Payer: Self-pay | Admitting: Cardiovascular Disease

## 2018-09-07 ENCOUNTER — Ambulatory Visit (HOSPITAL_COMMUNITY)
Admission: RE | Admit: 2018-09-07 | Discharge: 2018-09-07 | Disposition: A | Discharge status: Home / Self Care | Payer: Medicare Other | Source: Ambulatory Visit | Attending: Cardiology | Admitting: Cardiology

## 2018-09-07 ENCOUNTER — Other Ambulatory Visit: Payer: Self-pay

## 2018-09-07 DIAGNOSIS — I701 Atherosclerosis of renal artery: Secondary | ICD-10-CM

## 2018-09-07 DIAGNOSIS — Z9889 Other specified postprocedural states: Secondary | ICD-10-CM

## 2018-09-10 ENCOUNTER — Telehealth: Payer: Self-pay | Admitting: Cardiovascular Disease

## 2018-09-10 NOTE — Telephone Encounter (Signed)
Patient has some more questions about his test results he would like to speak to the nurse about it.

## 2018-09-10 NOTE — Telephone Encounter (Signed)
Returned the call to the patient. The results have been gone over with the patient. He has verbalized his understanding and will call back if anything further is needed.

## 2018-10-05 ENCOUNTER — Encounter: Payer: Medicare Other | Admitting: Cardiovascular Disease

## 2018-10-05 ENCOUNTER — Other Ambulatory Visit: Payer: Self-pay
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
## 2018-11-02 ENCOUNTER — Telehealth: Payer: Self-pay | Admitting: *Deleted

## 2018-11-02 NOTE — Telephone Encounter (Signed)
Left message for patient to call and schedule Pacer check appt with Dr. Sallyanne Kuster (1st available) per 11/02/18 staff message from Kathryne Sharper, RN

## 2018-11-05 NOTE — Telephone Encounter (Signed)
Left message for patient to call and schedule with Dr. Sallyanne Kuster on a Pacer Day

## 2018-11-09 NOTE — Telephone Encounter (Signed)
Left message for patient to call and schedule Pacer check appt with Dr. Sallyanne Kuster

## 2018-12-21 ENCOUNTER — Ambulatory Visit (INDEPENDENT_AMBULATORY_CARE_PROVIDER_SITE_OTHER): Payer: Medicare Other | Admitting: Cardiovascular Disease

## 2018-12-21 ENCOUNTER — Other Ambulatory Visit: Payer: Self-pay

## 2018-12-21 ENCOUNTER — Encounter: Payer: Self-pay | Admitting: Cardiovascular Disease

## 2018-12-21 VITALS — BP 92/64 | HR 77 | Temp 97.9°F | Ht 69.0 in | Wt 182.0 lb

## 2018-12-21 DIAGNOSIS — I701 Atherosclerosis of renal artery: Secondary | ICD-10-CM

## 2018-12-21 DIAGNOSIS — F329 Major depressive disorder, single episode, unspecified: Secondary | ICD-10-CM

## 2018-12-21 DIAGNOSIS — I2510 Atherosclerotic heart disease of native coronary artery without angina pectoris: Secondary | ICD-10-CM | POA: Diagnosis not present

## 2018-12-21 DIAGNOSIS — I951 Orthostatic hypotension: Secondary | ICD-10-CM | POA: Diagnosis not present

## 2018-12-21 DIAGNOSIS — I441 Atrioventricular block, second degree: Secondary | ICD-10-CM

## 2018-12-21 DIAGNOSIS — I471 Supraventricular tachycardia: Secondary | ICD-10-CM

## 2018-12-21 DIAGNOSIS — I10 Essential (primary) hypertension: Secondary | ICD-10-CM

## 2018-12-21 DIAGNOSIS — I495 Sick sinus syndrome: Secondary | ICD-10-CM

## 2018-12-21 DIAGNOSIS — F32A Depression, unspecified: Secondary | ICD-10-CM

## 2018-12-21 DIAGNOSIS — E782 Mixed hyperlipidemia: Secondary | ICD-10-CM | POA: Diagnosis not present

## 2018-12-21 DIAGNOSIS — Z950 Presence of cardiac pacemaker: Secondary | ICD-10-CM

## 2018-12-21 DIAGNOSIS — F419 Anxiety disorder, unspecified: Secondary | ICD-10-CM

## 2018-12-21 DIAGNOSIS — I4719 Other supraventricular tachycardia: Secondary | ICD-10-CM
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
## 2018-12-21 NOTE — Patient Instructions (Signed)
Medication Instructions:  No changes *If you need a refill on your cardiac medications before your next appointment, please call your pharmacy*  Lab Work: Your provider would like for you to return in few weeks to have the following labs drawn: FASTING LIPID. You do not need an appointment for the lab. Once in our office lobby there is a podium where you can sign in and ring the doorbell to alert [place] that you are here. The lab is open from 8:00 am to 4:30 pm; closed for lunch from 12:45pm-1:45pm.  If you have labs (blood work) drawn today and your tests are completely normal, you will receive your results only by: Marland Kitchen MyChart Message (if you have MyChart) OR . A paper copy in the mail If you have any lab test that is abnormal or we need to change your treatment, we will call you to review the results.  Testing/Procedures: None ordered  Follow-Up: At [facility], you and your health needs are our priority.  As part of our continuing mission to provide you with exceptional heart care, we have created designated Provider Care Teams.  These Care Teams include your primary Cardiologist (physician) and Advanced Practice Providers (APPs -  Physician Assistants and Nurse Practitioners) who all work together to provide you with the care you need, when you need it.  Your next appointment:   6 months  The format for your next appointment:   In Person  Provider:   Sanda Klein, MD

## 2018-12-30 DIAGNOSIS — N390 Urinary tract infection, site not specified: Secondary | ICD-10-CM | POA: Diagnosis not present

## 2018-12-30 DIAGNOSIS — R300 Dysuria: Secondary | ICD-10-CM | POA: Diagnosis not present

## 2019-01-23 ENCOUNTER — Emergency Department (HOSPITAL_COMMUNITY): Payer: Medicare Other

## 2019-01-23 ENCOUNTER — Other Ambulatory Visit: Payer: Self-pay

## 2019-01-23 ENCOUNTER — Emergency Department (HOSPITAL_COMMUNITY)
Admission: EM | Admit: 2019-01-23 | Discharge: 2019-01-23 | Disposition: A | Discharge status: Home / Self Care | Payer: Medicare Other | Attending: Emergency Medicine | Admitting: Emergency Medicine

## 2019-01-23 DIAGNOSIS — W19XXXA Unspecified fall, initial encounter: Secondary | ICD-10-CM

## 2019-01-23 DIAGNOSIS — I10 Essential (primary) hypertension: Secondary | ICD-10-CM | POA: Diagnosis not present

## 2019-01-23 DIAGNOSIS — M25552 Pain in left hip: Secondary | ICD-10-CM | POA: Diagnosis not present

## 2019-01-23 DIAGNOSIS — I480 Paroxysmal atrial fibrillation: Secondary | ICD-10-CM | POA: Diagnosis not present

## 2019-01-23 DIAGNOSIS — Z950 Presence of cardiac pacemaker: Secondary | ICD-10-CM | POA: Diagnosis not present

## 2019-01-23 DIAGNOSIS — Z87891 Personal history of nicotine dependence: Secondary | ICD-10-CM | POA: Insufficient documentation

## 2019-01-23 DIAGNOSIS — G9389 Other specified disorders of brain: Secondary | ICD-10-CM | POA: Diagnosis not present

## 2019-01-23 DIAGNOSIS — I2510 Atherosclerotic heart disease of native coronary artery without angina pectoris: Secondary | ICD-10-CM | POA: Insufficient documentation

## 2019-01-23 DIAGNOSIS — Z79899 Other long term (current) drug therapy: Secondary | ICD-10-CM | POA: Diagnosis not present

## 2019-01-23 DIAGNOSIS — M79605 Pain in left leg: Secondary | ICD-10-CM | POA: Diagnosis present

## 2019-01-23 DIAGNOSIS — M25559 Pain in unspecified hip: Secondary | ICD-10-CM

## 2019-01-23 LAB — CBC WITH DIFFERENTIAL/PLATELET
Abs Immature Granulocytes: 0.02 10*3/uL (ref 0.00–0.07)
Basophils Absolute: 0.1 10*3/uL (ref 0.0–0.1)
Basophils Relative: 1 %
Eosinophils Absolute: 0.1 10*3/uL (ref 0.0–0.5)
Eosinophils Relative: 1 %
HCT: 42.2 % (ref 39.0–52.0)
Hemoglobin: 14.2 g/dL (ref 13.0–17.0)
Immature Granulocytes: 0 %
Lymphocytes Relative: 15 %
Lymphs Abs: 1.1 10*3/uL (ref 0.7–4.0)
MCH: 30.8 pg (ref 26.0–34.0)
MCHC: 33.6 g/dL (ref 30.0–36.0)
MCV: 91.5 fL (ref 80.0–100.0)
Monocytes Absolute: 0.5 10*3/uL (ref 0.1–1.0)
Monocytes Relative: 7 %
Neutro Abs: 5.5 10*3/uL (ref 1.7–7.7)
Neutrophils Relative %: 76 %
Platelets: 195 10*3/uL (ref 150–400)
RBC: 4.61 MIL/uL (ref 4.22–5.81)
RDW: 13.8 % (ref 11.5–15.5)
WBC: 7.2 10*3/uL (ref 4.0–10.5)
nRBC: 0 % (ref 0.0–0.2)

## 2019-01-23 LAB — BASIC METABOLIC PANEL
Anion gap: 12 (ref 5–15)
BUN: 17 mg/dL (ref 8–23)
CO2: 25 mmol/L (ref 22–32)
Calcium: 9.2 mg/dL (ref 8.9–10.3)
Chloride: 100 mmol/L (ref 98–111)
Creatinine, Ser: 1 mg/dL (ref 0.61–1.24)
GFR calc Af Amer: >60 mL/min (ref >60)
GFR calc non Af Amer: >60 mL/min (ref >60)
Glucose, Bld: 117 mg/dL — ABNORMAL HIGH (ref 70–99)
Potassium: 3.7 mmol/L (ref 3.5–5.1)
Sodium: 137 mmol/L (ref 135–145)

## 2019-01-23 LAB — URINALYSIS, ROUTINE W REFLEX MICROSCOPIC
Bacteria, UA: NONE SEEN
Bilirubin Urine: NEGATIVE
Glucose, UA: NEGATIVE mg/dL
Ketones, ur: 20 mg/dL — AB
Leukocytes,Ua: NEGATIVE
Nitrite: NEGATIVE
Protein, ur: NEGATIVE mg/dL
Specific Gravity, Urine: 1.015 (ref 1.005–1.030)
pH: 6 (ref 5.0–8.0)

## 2019-01-23 LAB — CK: Total CK: 653 U/L — ABNORMAL HIGH (ref 49–397)

## 2019-01-23 MED ORDER — FENTANYL CITRATE (PF) 100 MCG/2ML IJ SOLN
100.0000 ug | Freq: Once | INTRAMUSCULAR | Status: AC
  Administered 2019-01-23: 10:00:00 100 ug via INTRAVENOUS
  Filled 2019-01-23: qty 2

## 2019-01-23 MED ORDER — MORPHINE SULFATE (PF) 4 MG/ML IV SOLN
4.0000 mg | Freq: Once | INTRAVENOUS | Status: AC
  Administered 2019-01-23: 4 mg via INTRAVENOUS
  Filled 2019-01-23: qty 1

## 2019-01-23 NOTE — ED Notes (Signed)
Patient coming out of room with no mask on demanding we let him leave. Patient states he has been here too long and needs to go. Patient redirected to his room and informed to wear mask in halls. Patient refused to put on mask or go back in room. Patient pulled IV out on his own. Patient stated we better find him a ride out of here before he starts screaming. Patient stated he has no one to pick him up. Patient is ambulatory and oriented and is not a PTAR candidate at this time.

## 2019-01-23 NOTE — ED Notes (Signed)
Patient unable to find ride home. Patient's son contacted but said he would be unable to come for a couple of hours. Contacted PTAR due to patients hip injury/pain, AMS.

## 2019-01-23 NOTE — Discharge Instructions (Signed)
Return here as needed.  Follow-up with your primary doctor.  Your CT scan did not show any abnormalities at this time.
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
## 2019-01-23 NOTE — ED Triage Notes (Signed)
Per EMS, patient has fallen several times recently. Patient fell last night and is c/o left hip and shoulder pain 10/10.
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
## 2019-03-10 DIAGNOSIS — M533 Sacrococcygeal disorders, not elsewhere classified: Secondary | ICD-10-CM | POA: Diagnosis not present

## 2019-03-18 ENCOUNTER — Telehealth: Payer: Self-pay

## 2019-03-18 NOTE — Telephone Encounter (Signed)
   [facility] Pre-operative Risk Assessment    Request for surgical clearance:  1. What type of surgery is being performed? EXTRACTION OF 5 TEETH   2. When is this surgery scheduled? TBD   3. What type of clearance is required (medical clearance vs. Pharmacy clearance to hold med vs. Both)? MEDICAL  4. Are there any medications that need to be held prior to surgery and how long? NONE   5. Practice name and name of physician performing surgery? [facility]   6. What is your office phone number [phone]    7.   What is your office fax number [phone]  8.   Anesthesia type (None, local, MAC, general) ? LIGHT IV SEDATION-PLEASE ADVISE OF USE OF EPI/NO EPI   _________________________________________________________________   (provider comments below)

## 2019-03-18 NOTE — Telephone Encounter (Addendum)
   Primary Cardiologist: Sanda Klein, MD  Chart reviewed as part of pre-operative protocol coverage. Given past medical history and time since last visit, based on ACC/AHA guidelines, Kevin Wiley would be at acceptable risk for the planned procedure without further cardiovascular testing.   He has a RCRI class II risk, 0.9% risk major cardiac event.  I will route this recommendation to the requesting party via Epic fax function and remove from pre-op pool.  Please call with questions.  Jossie Ng. [facility] [place] [address] Office [phone] Fax [phone]

## 2019-03-25 IMAGING — DX DG CHEST 1V PORT
1 series · 2 of 2 positions shown · non-contrast
Comparison: None.

CLINICAL DATA: 76-year-old male with cough and fever.

EXAM:
PORTABLE CHEST 1 VIEW

[Series 1: chest ap · 0.14mm/px · 2 of 2 slices shown]
[im 1/2]
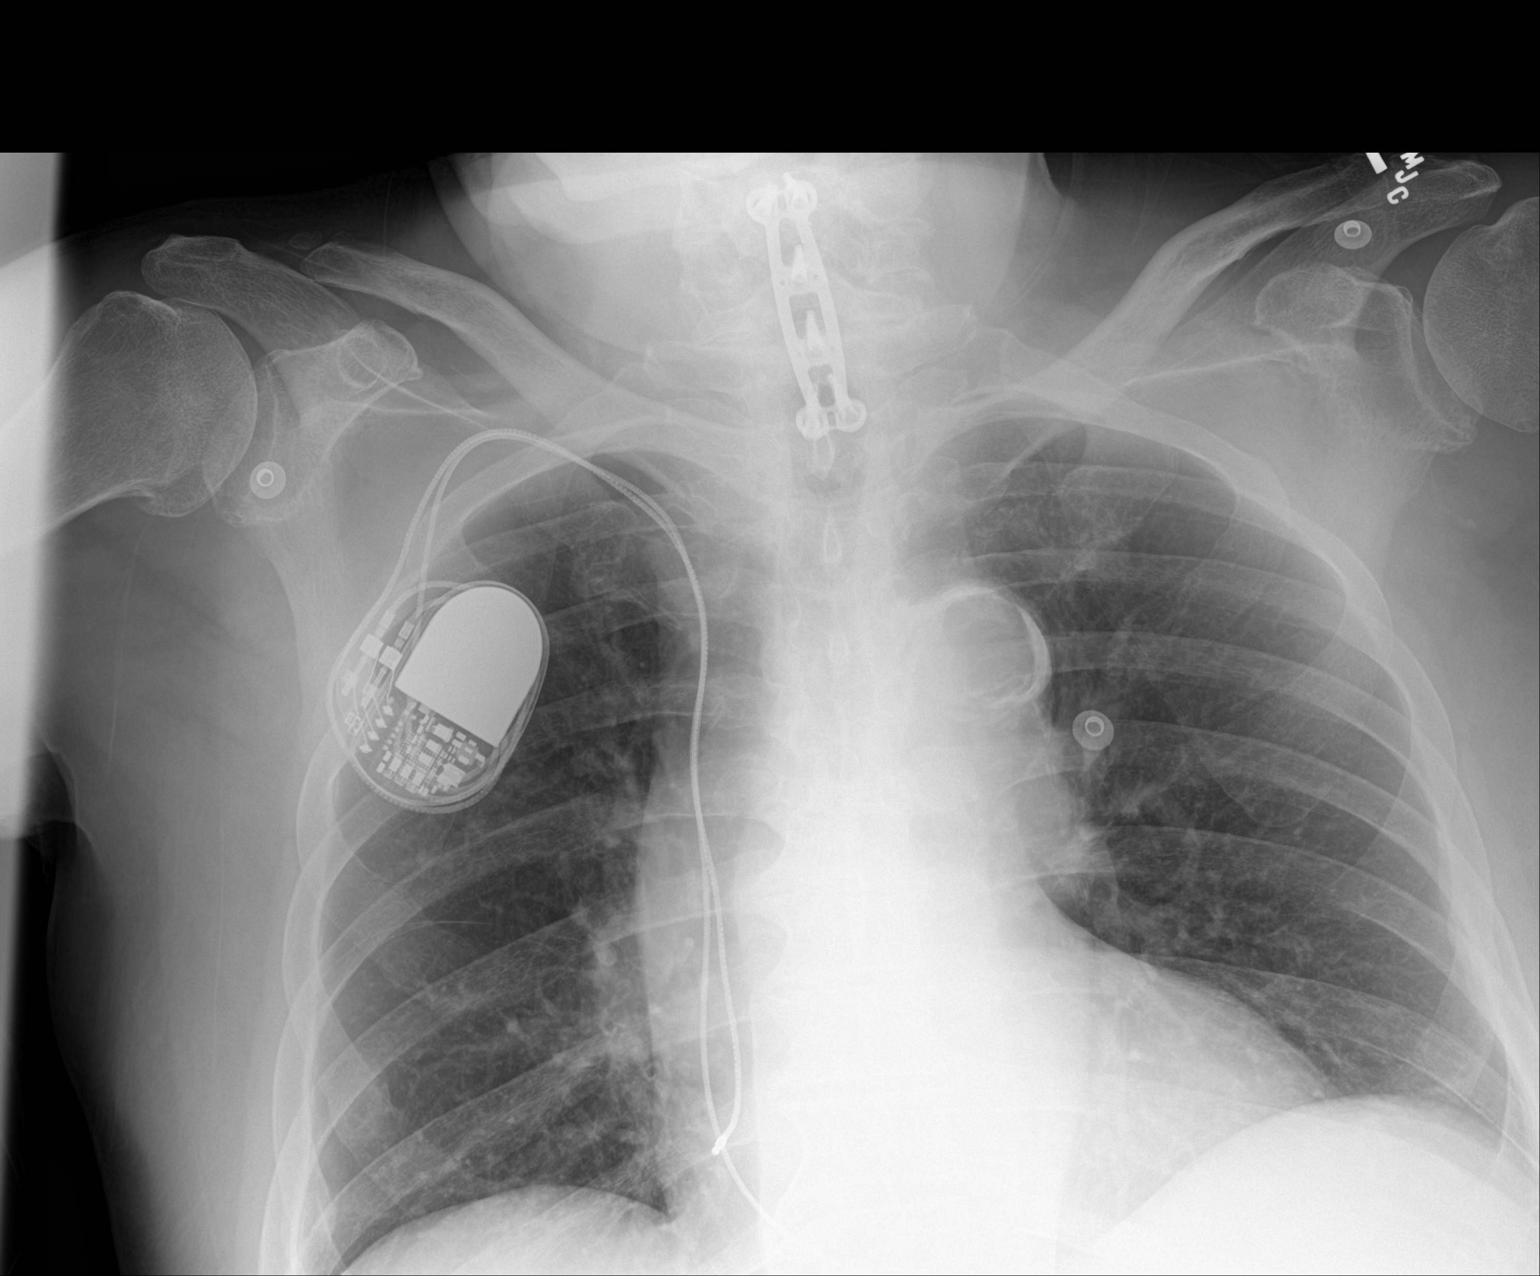
[im 2/2]
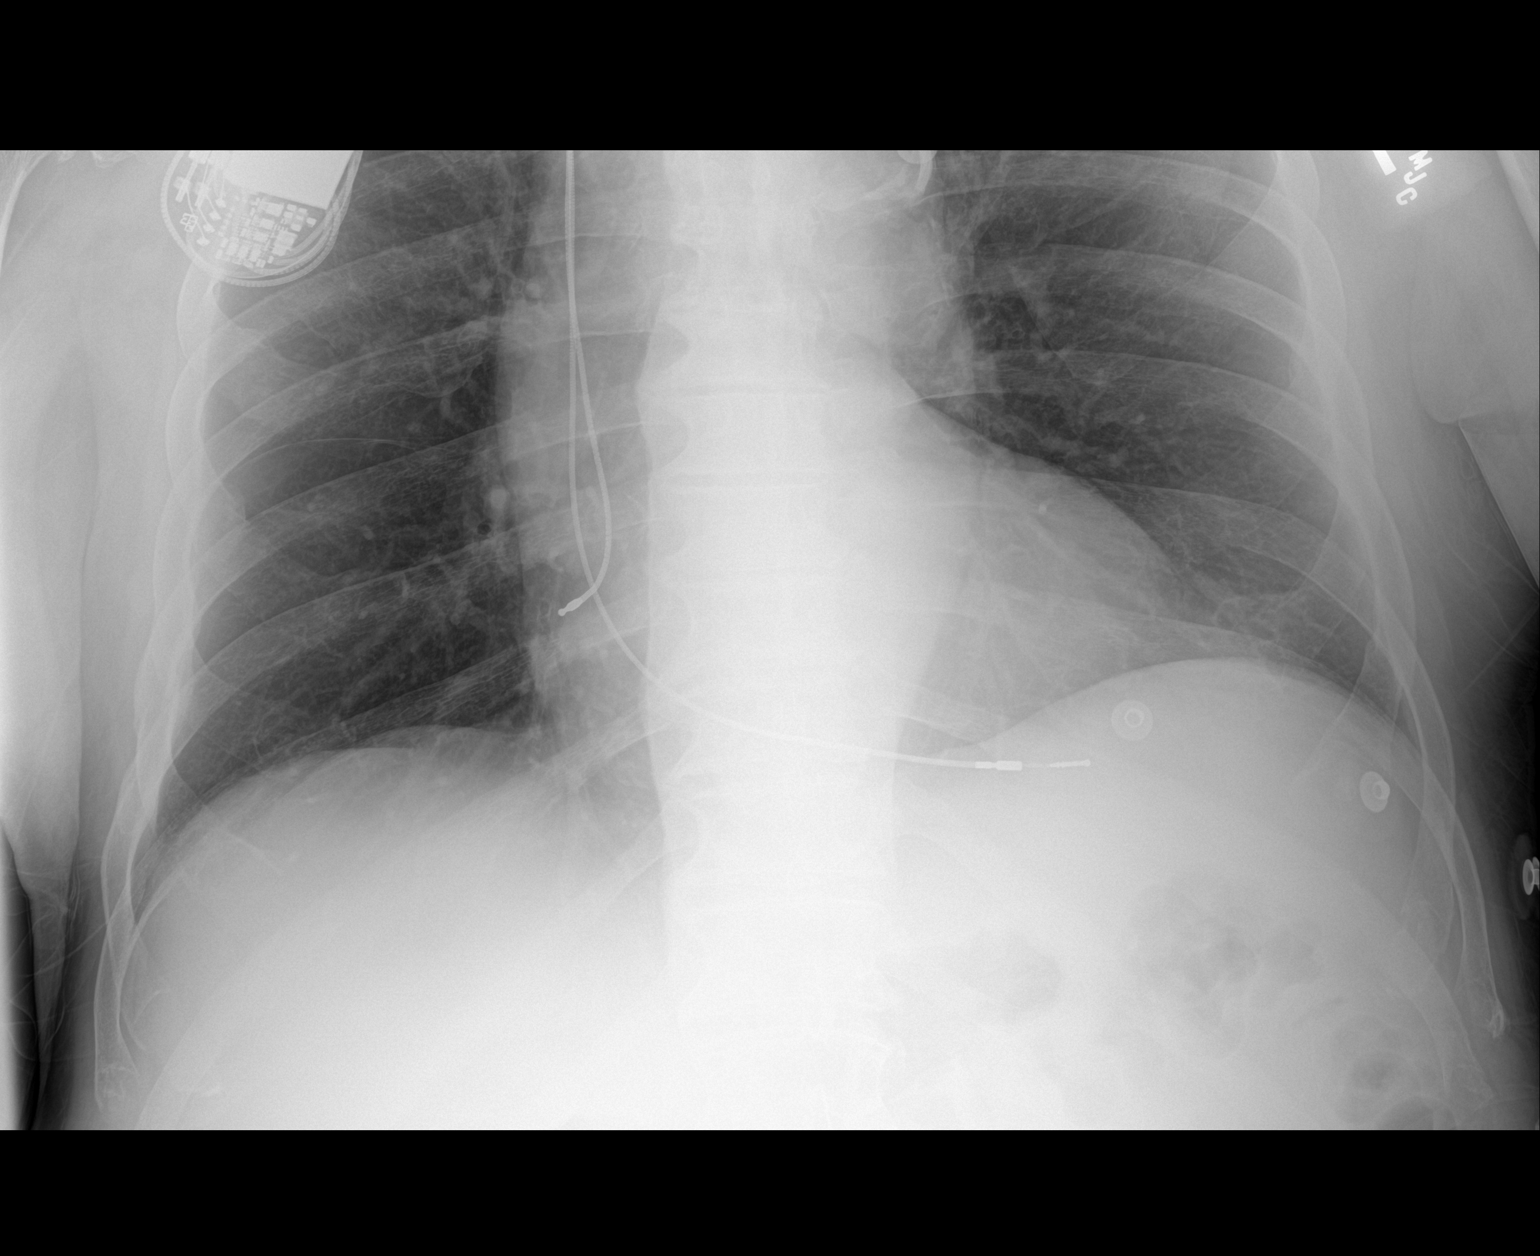

[2 of 2 positions shown; findings below may reference images not displayed]

FINDINGS: The lungs are clear. There is no pleural effusion or pneumothorax.
The cardiac silhouette is within normal limits. Atherosclerotic
calcification of the aortic arch. Right pectoral pacemaker device.
Cervical fixation hardware. No acute osseous pathology.
IMPRESSION: No active disease.

## 2019-04-06 DIAGNOSIS — Z23 Encounter for immunization: Secondary | ICD-10-CM | POA: Diagnosis not present

## 2019-04-12 DIAGNOSIS — M19019 Primary osteoarthritis, unspecified shoulder: Secondary | ICD-10-CM | POA: Diagnosis not present

## 2019-04-12 DIAGNOSIS — M533 Sacrococcygeal disorders, not elsewhere classified: Secondary | ICD-10-CM | POA: Diagnosis not present

## 2019-04-12 DIAGNOSIS — M545 Low back pain: Secondary | ICD-10-CM | POA: Diagnosis not present

## 2019-04-12 DIAGNOSIS — M19011 Primary osteoarthritis, right shoulder: Secondary | ICD-10-CM | POA: Diagnosis not present

## 2019-05-05 DIAGNOSIS — Z23 Encounter for immunization: Secondary | ICD-10-CM | POA: Diagnosis not present

## 2019-05-11 DIAGNOSIS — Z20828 Contact with and (suspected) exposure to other viral communicable diseases: Secondary | ICD-10-CM | POA: Diagnosis not present

## 2019-08-02 ENCOUNTER — Encounter: Payer: Medicare Other | Admitting: Cardiovascular Disease

## 2019-09-06 ENCOUNTER — Other Ambulatory Visit (HOSPITAL_COMMUNITY): Payer: Self-pay | Admitting: Cardiovascular Disease

## 2019-09-06 DIAGNOSIS — Z9889 Other specified postprocedural states: Secondary | ICD-10-CM

## 2019-09-06 DIAGNOSIS — I701 Atherosclerosis of renal artery: Secondary | ICD-10-CM

## 2019-09-07 ENCOUNTER — Ambulatory Visit (HOSPITAL_COMMUNITY): Admission: RE | Admit: 2019-09-07 | Payer: Medicare Other | Source: Ambulatory Visit

## 2019-10-04 ENCOUNTER — Encounter: Payer: Medicare Other | Admitting: Cardiovascular Disease

## 2019-10-06 ENCOUNTER — Other Ambulatory Visit: Payer: Self-pay

## 2019-10-06 ENCOUNTER — Ambulatory Visit (HOSPITAL_COMMUNITY)
Admission: RE | Admit: 2019-10-06 | Discharge: 2019-10-06 | Disposition: A | Discharge status: Home / Self Care | Payer: Medicare Other | Source: Ambulatory Visit | Attending: Cardiovascular Disease | Admitting: Cardiovascular Disease

## 2019-10-06 DIAGNOSIS — Z9889 Other specified postprocedural states: Secondary | ICD-10-CM

## 2019-10-06 DIAGNOSIS — I701 Atherosclerosis of renal artery: Secondary | ICD-10-CM

## 2019-10-13 DIAGNOSIS — I2510 Atherosclerotic heart disease of native coronary artery without angina pectoris: Secondary | ICD-10-CM | POA: Diagnosis not present

## 2019-10-13 DIAGNOSIS — R2689 Other abnormalities of gait and mobility: Secondary | ICD-10-CM | POA: Diagnosis not present

## 2019-10-13 DIAGNOSIS — E785 Hyperlipidemia, unspecified: Secondary | ICD-10-CM | POA: Diagnosis not present

## 2019-10-13 DIAGNOSIS — Z0000 Encounter for general adult medical examination without abnormal findings: Secondary | ICD-10-CM | POA: Diagnosis not present

## 2019-10-13 DIAGNOSIS — G4700 Insomnia, unspecified: Secondary | ICD-10-CM | POA: Diagnosis not present

## 2019-10-13 DIAGNOSIS — R82998 Other abnormal findings in urine: Secondary | ICD-10-CM | POA: Diagnosis not present

## 2019-10-13 DIAGNOSIS — R413 Other amnesia: Secondary | ICD-10-CM | POA: Diagnosis not present

## 2019-10-13 DIAGNOSIS — I10 Essential (primary) hypertension: Secondary | ICD-10-CM | POA: Diagnosis not present

## 2019-10-13 DIAGNOSIS — F4310 Post-traumatic stress disorder, unspecified: Secondary | ICD-10-CM | POA: Diagnosis not present

## 2019-10-13 DIAGNOSIS — Z125 Encounter for screening for malignant neoplasm of prostate: Secondary | ICD-10-CM | POA: Diagnosis not present

## 2019-10-13 DIAGNOSIS — R7989 Other specified abnormal findings of blood chemistry: Secondary | ICD-10-CM | POA: Diagnosis not present

## 2019-12-01 DIAGNOSIS — I10 Essential (primary) hypertension: Secondary | ICD-10-CM | POA: Diagnosis not present

## 2019-12-01 DIAGNOSIS — R2689 Other abnormalities of gait and mobility: Secondary | ICD-10-CM | POA: Diagnosis not present

## 2019-12-01 DIAGNOSIS — Z23 Encounter for immunization: Secondary | ICD-10-CM | POA: Diagnosis not present

## 2019-12-01 DIAGNOSIS — F4310 Post-traumatic stress disorder, unspecified: Secondary | ICD-10-CM | POA: Diagnosis not present

## 2019-12-20 ENCOUNTER — Encounter: Payer: Medicare Other | Admitting: Cardiovascular Disease

## 2019-12-27 DIAGNOSIS — Z1152 Encounter for screening for COVID-19: Secondary | ICD-10-CM | POA: Diagnosis not present

## 2019-12-27 DIAGNOSIS — I10 Essential (primary) hypertension: Secondary | ICD-10-CM | POA: Diagnosis not present

## 2019-12-27 DIAGNOSIS — R0981 Nasal congestion: Secondary | ICD-10-CM | POA: Diagnosis not present

## 2019-12-27 DIAGNOSIS — R059 Cough, unspecified: Secondary | ICD-10-CM | POA: Diagnosis not present

## 2019-12-27 DIAGNOSIS — J069 Acute upper respiratory infection, unspecified: Secondary | ICD-10-CM | POA: Diagnosis not present

## 2020-02-11 DIAGNOSIS — M545 Low back pain, unspecified: Secondary | ICD-10-CM | POA: Diagnosis not present

## 2020-02-11 DIAGNOSIS — M5431 Sciatica, right side: Secondary | ICD-10-CM | POA: Diagnosis not present

## 2020-02-16 ENCOUNTER — Emergency Department (HOSPITAL_COMMUNITY): Payer: Medicare Other

## 2020-02-16 ENCOUNTER — Other Ambulatory Visit: Payer: Self-pay

## 2020-02-16 ENCOUNTER — Emergency Department (HOSPITAL_COMMUNITY)
Admission: EM | Admit: 2020-02-16 | Discharge: 2020-02-17 | Disposition: A | Discharge status: Home / Self Care | Payer: Medicare Other | Attending: Emergency Medicine | Admitting: Emergency Medicine

## 2020-02-16 DIAGNOSIS — Z950 Presence of cardiac pacemaker: Secondary | ICD-10-CM | POA: Insufficient documentation

## 2020-02-16 DIAGNOSIS — Z79899 Other long term (current) drug therapy: Secondary | ICD-10-CM | POA: Insufficient documentation

## 2020-02-16 DIAGNOSIS — I4891 Unspecified atrial fibrillation: Secondary | ICD-10-CM | POA: Diagnosis not present

## 2020-02-16 DIAGNOSIS — I10 Essential (primary) hypertension: Secondary | ICD-10-CM | POA: Diagnosis not present

## 2020-02-16 DIAGNOSIS — M545 Low back pain, unspecified: Secondary | ICD-10-CM | POA: Diagnosis not present

## 2020-02-16 DIAGNOSIS — I2510 Atherosclerotic heart disease of native coronary artery without angina pectoris: Secondary | ICD-10-CM | POA: Insufficient documentation

## 2020-02-16 DIAGNOSIS — Z955 Presence of coronary angioplasty implant and graft: Secondary | ICD-10-CM | POA: Diagnosis not present

## 2020-02-16 DIAGNOSIS — I491 Atrial premature depolarization: Secondary | ICD-10-CM | POA: Diagnosis not present

## 2020-02-16 DIAGNOSIS — Z87891 Personal history of nicotine dependence: Secondary | ICD-10-CM | POA: Diagnosis not present

## 2020-02-16 DIAGNOSIS — R262 Difficulty in walking, not elsewhere classified: Secondary | ICD-10-CM | POA: Diagnosis not present

## 2020-02-16 DIAGNOSIS — R42 Dizziness and giddiness: Secondary | ICD-10-CM | POA: Diagnosis not present

## 2020-02-16 LAB — COMPREHENSIVE METABOLIC PANEL
ALT: 15 U/L (ref 0–44)
AST: 16 U/L (ref 15–41)
Albumin: 3.5 g/dL (ref 3.5–5.0)
Alkaline Phosphatase: 50 U/L (ref 38–126)
Anion gap: 10 (ref 5–15)
BUN: 29 mg/dL — ABNORMAL HIGH (ref 8–23)
CO2: 24 mmol/L (ref 22–32)
Calcium: 8.6 mg/dL — ABNORMAL LOW (ref 8.9–10.3)
Chloride: 102 mmol/L (ref 98–111)
Creatinine, Ser: 1.38 mg/dL — ABNORMAL HIGH (ref 0.61–1.24)
GFR, Estimated: 52 mL/min — ABNORMAL LOW (ref >60)
Glucose, Bld: 82 mg/dL (ref 70–99)
Potassium: 4.1 mmol/L (ref 3.5–5.1)
Sodium: 136 mmol/L (ref 135–145)
Total Bilirubin: 0.7 mg/dL (ref 0.3–1.2)
Total Protein: 5.5 g/dL — ABNORMAL LOW (ref 6.5–8.1)

## 2020-02-16 LAB — DIFFERENTIAL
Abs Immature Granulocytes: 0.15 10*3/uL — ABNORMAL HIGH (ref 0.00–0.07)
Basophils Absolute: 0 10*3/uL (ref 0.0–0.1)
Basophils Relative: 0 %
Eosinophils Absolute: 0 10*3/uL (ref 0.0–0.5)
Eosinophils Relative: 0 %
Immature Granulocytes: 1 %
Lymphocytes Relative: 6 %
Lymphs Abs: 0.7 10*3/uL (ref 0.7–4.0)
Monocytes Absolute: 1.2 10*3/uL — ABNORMAL HIGH (ref 0.1–1.0)
Monocytes Relative: 10 %
Neutro Abs: 9.9 10*3/uL — ABNORMAL HIGH (ref 1.7–7.7)
Neutrophils Relative %: 83 %

## 2020-02-16 LAB — I-STAT CHEM 8, ED
BUN: 38 mg/dL — ABNORMAL HIGH (ref 8–23)
Calcium, Ion: 1.16 mmol/L (ref 1.15–1.40)
Chloride: 101 mmol/L (ref 98–111)
Creatinine, Ser: 1.4 mg/dL — ABNORMAL HIGH (ref 0.61–1.24)
Glucose, Bld: 78 mg/dL (ref 70–99)
HCT: 38 % — ABNORMAL LOW (ref 39.0–52.0)
Hemoglobin: 12.9 g/dL — ABNORMAL LOW (ref 13.0–17.0)
Potassium: 4.1 mmol/L (ref 3.5–5.1)
Sodium: 136 mmol/L (ref 135–145)
TCO2: 28 mmol/L (ref 22–32)

## 2020-02-16 LAB — CBC
HCT: 40.7 % (ref 39.0–52.0)
Hemoglobin: 13.2 g/dL (ref 13.0–17.0)
MCH: 30.1 pg (ref 26.0–34.0)
MCHC: 32.4 g/dL (ref 30.0–36.0)
MCV: 92.9 fL (ref 80.0–100.0)
Platelets: 196 10*3/uL (ref 150–400)
RBC: 4.38 MIL/uL (ref 4.22–5.81)
RDW: 14 % (ref 11.5–15.5)
WBC: 12 10*3/uL — ABNORMAL HIGH (ref 4.0–10.5)
nRBC: 0 % (ref 0.0–0.2)

## 2020-02-16 LAB — PROTIME-INR
INR: 1.1 (ref 0.8–1.2)
Prothrombin Time: 13.6 seconds (ref 11.4–15.2)

## 2020-02-16 LAB — CBG MONITORING, ED: Glucose-Capillary: 94 mg/dL (ref 70–99)

## 2020-02-16 LAB — APTT: aPTT: 25 seconds (ref 24–36)

## 2020-02-16 MED ORDER — GABAPENTIN 300 MG PO CAPS
300.0000 mg | ORAL_CAPSULE | Freq: Once | ORAL | Status: AC
  Administered 2020-02-16: 300 mg via ORAL
  Filled 2020-02-16: qty 1

## 2020-02-16 MED ORDER — HYDRALAZINE HCL 50 MG PO TABS
50.0000 mg | ORAL_TABLET | Freq: Three times a day (TID) | ORAL | Status: DC
  Administered 2020-02-17 (×2): 50 mg via ORAL
  Filled 2020-02-16: qty 1
  Filled 2020-02-16: qty 2

## 2020-02-16 MED ORDER — MORPHINE SULFATE (PF) 4 MG/ML IV SOLN
4.0000 mg | Freq: Once | INTRAVENOUS | Status: AC
  Administered 2020-02-16: 4 mg via INTRAVENOUS
  Filled 2020-02-16: qty 1

## 2020-02-16 MED ORDER — ATENOLOL 50 MG PO TABS
100.0000 mg | ORAL_TABLET | Freq: Every day | ORAL | Status: DC
  Administered 2020-02-17 (×2): 100 mg via ORAL
  Filled 2020-02-16 (×2): qty 2

## 2020-02-16 MED ORDER — CYCLOBENZAPRINE HCL 10 MG PO TABS
5.0000 mg | ORAL_TABLET | Freq: Once | ORAL | Status: AC
  Administered 2020-02-16: 5 mg via ORAL
  Filled 2020-02-16: qty 1

## 2020-02-16 MED ORDER — KETOROLAC TROMETHAMINE 15 MG/ML IJ SOLN
15.0000 mg | Freq: Once | INTRAMUSCULAR | Status: AC
  Administered 2020-02-16: 15 mg via INTRAVENOUS
  Filled 2020-02-16: qty 1

## 2020-02-16 MED ORDER — SODIUM CHLORIDE 0.9% FLUSH
3.0000 mL | Freq: Once | INTRAVENOUS | Status: DC
Start: 2020-02-16 — End: 2020-02-17

## 2020-02-16 NOTE — ED Triage Notes (Signed)
Pt via EMS from home for eval of dizziness since this morning on waking. Decreased PO intake over the last few weeks d/t his wife being in rehab. Pt had multiple falls today d/t dizziness. C/o generalized back pain.
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
## 2020-02-17 DIAGNOSIS — M549 Dorsalgia, unspecified: Secondary | ICD-10-CM | POA: Diagnosis not present

## 2020-02-17 DIAGNOSIS — R42 Dizziness and giddiness: Secondary | ICD-10-CM | POA: Diagnosis not present

## 2020-02-17 DIAGNOSIS — R52 Pain, unspecified: Secondary | ICD-10-CM | POA: Diagnosis not present

## 2020-02-17 DIAGNOSIS — G459 Transient cerebral ischemic attack, unspecified: Secondary | ICD-10-CM | POA: Diagnosis not present

## 2020-02-17 DIAGNOSIS — M2550 Pain in unspecified joint: Secondary | ICD-10-CM | POA: Diagnosis not present

## 2020-02-17 DIAGNOSIS — Z7401 Bed confinement status: Secondary | ICD-10-CM | POA: Diagnosis not present

## 2020-02-17 LAB — I-STAT BETA HCG BLOOD, ED (MC, WL, AP ONLY)

## 2020-02-17 NOTE — ED Notes (Signed)
[facility] was called and stated they don't have an ETA on transport at this time.

## 2020-02-17 NOTE — Discharge Instructions (Signed)
Today's evaluation has been generally reassuring.  There is no demonstration of a stroke, and your labs did not demonstrate substantial abnormalities. However, with your dizziness, it is very important that you focus on taking your medication as directed, and start taking aspirin, 81 mg, daily. Please follow-up with your physician.  Return here for concerning changes in your condition.

## 2020-02-17 NOTE — ED Notes (Signed)
Called [facility] for transport home.  

## 2020-02-23 DIAGNOSIS — M4306 Spondylolysis, lumbar region: Secondary | ICD-10-CM | POA: Diagnosis not present

## 2020-02-23 DIAGNOSIS — F4310 Post-traumatic stress disorder, unspecified: Secondary | ICD-10-CM | POA: Diagnosis not present

## 2020-02-23 DIAGNOSIS — M5416 Radiculopathy, lumbar region: Secondary | ICD-10-CM | POA: Diagnosis not present

## 2020-04-17 ENCOUNTER — Ambulatory Visit (INDEPENDENT_AMBULATORY_CARE_PROVIDER_SITE_OTHER): Payer: Medicare Other | Admitting: Cardiovascular Disease

## 2020-04-17 ENCOUNTER — Other Ambulatory Visit: Payer: Self-pay

## 2020-04-17 ENCOUNTER — Encounter: Payer: Self-pay | Admitting: Cardiovascular Disease

## 2020-04-17 VITALS — BP 152/94 | HR 67 | Ht 69.0 in | Wt 161.2 lb

## 2020-04-17 DIAGNOSIS — E785 Hyperlipidemia, unspecified: Secondary | ICD-10-CM

## 2020-04-17 DIAGNOSIS — I10 Essential (primary) hypertension: Secondary | ICD-10-CM

## 2020-04-17 DIAGNOSIS — Z950 Presence of cardiac pacemaker: Secondary | ICD-10-CM | POA: Diagnosis not present

## 2020-04-17 DIAGNOSIS — I951 Orthostatic hypotension: Secondary | ICD-10-CM | POA: Diagnosis not present

## 2020-04-17 DIAGNOSIS — I441 Atrioventricular block, second degree: Secondary | ICD-10-CM

## 2020-04-17 DIAGNOSIS — I701 Atherosclerosis of renal artery: Secondary | ICD-10-CM | POA: Diagnosis not present

## 2020-04-17 DIAGNOSIS — E782 Mixed hyperlipidemia: Secondary | ICD-10-CM

## 2020-04-17 DIAGNOSIS — I714 Abdominal aortic aneurysm, without rupture, unspecified: Secondary | ICD-10-CM

## 2020-04-17 DIAGNOSIS — I495 Sick sinus syndrome: Secondary | ICD-10-CM

## 2020-04-17 DIAGNOSIS — I712 Thoracic aortic aneurysm, without rupture, unspecified: Secondary | ICD-10-CM

## 2020-04-17 DIAGNOSIS — I471 Supraventricular tachycardia: Secondary | ICD-10-CM | POA: Diagnosis not present

## 2020-04-17 LAB — BASIC METABOLIC PANEL
BUN/Creatinine Ratio: 11 (ref 10–24)
BUN: 14 mg/dL (ref 8–27)
CO2: 24 mmol/L (ref 20–29)
Calcium: 9.5 mg/dL (ref 8.6–10.2)
Chloride: 99 mmol/L (ref 96–106)
Creatinine, Ser: 1.32 mg/dL — ABNORMAL HIGH (ref 0.76–1.27)
Glucose: 114 mg/dL — ABNORMAL HIGH (ref 65–99)
Potassium: 4.9 mmol/L (ref 3.5–5.2)
Sodium: 137 mmol/L (ref 134–144)
eGFR: 55 mL/min/{1.73_m2} — ABNORMAL LOW (ref >59)

## 2020-04-17 MED ORDER — ATORVASTATIN CALCIUM 80 MG PO TABS: 80.0000 mg | ORAL_TABLET | Freq: Every day | ORAL | 3 refills | Status: DC

## 2020-04-17 MED ORDER — ASPIRIN EC 81 MG PO TBEC: 81.0000 mg | DELAYED_RELEASE_TABLET | Freq: Every day | ORAL | 3 refills | Status: DC

## 2020-04-17 NOTE — Patient Instructions (Signed)
Medication Instructions:  START the Aspirin 81 mg once daily START the Atorvastatin 80 mg once daily  *If you need a refill on your cardiac medications before your next appointment, please call your pharmacy*   Lab Work: BMET prior to the CT  If you have labs (blood work) drawn today and your tests are completely normal, you will receive your results only by: Marland Kitchen MyChart Message (if you have MyChart) OR . A paper copy in the mail If you have any lab test that is abnormal or we need to change your treatment, we will call you to review the results.   Testing/Procedures: Dr. Sallyanne Kuster has ordered a CT-scan of the pelvis and a CT-scan of the Aorta  Non-Cardiac CT Angiography (CTA), is a special type of CT scan that uses a computer to produce multi-dimensional views of major blood vessels throughout the body. In CT angiography, a contrast material is injected through an IV to help visualize the blood vessels  Follow-Up: At [facility], you and your health needs are our priority.  As part of our continuing mission to provide you with exceptional heart care, we have created designated Provider Care Teams.  These Care Teams include your primary Cardiologist (physician) and Advanced Practice Providers (APPs -  Physician Assistants and Nurse Practitioners) who all work together to provide you with the care you need, when you need it.  We recommend signing up for the patient portal called "MyChart".  Sign up information is provided on this After Visit Summary.  MyChart is used to connect with patients for Virtual Visits (Telemedicine).  Patients are able to view lab/test results, encounter notes, upcoming appointments, etc.  Non-urgent messages can be sent to your provider as well.   To learn more about what you can do with MyChart, go to [email or web address].    Your next appointment:   12 month(s)  The format for your next appointment:   In Person  Provider:   Sanda Klein, MD
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
## 2020-04-19 ENCOUNTER — Encounter: Payer: Self-pay | Admitting: Cardiovascular Disease

## 2020-04-24 ENCOUNTER — Ambulatory Visit (HOSPITAL_COMMUNITY): Admission: RE | Admit: 2020-04-24 | Payer: Medicare Other | Source: Ambulatory Visit

## 2020-04-26 ENCOUNTER — Telehealth: Payer: Self-pay | Admitting: Cardiovascular Disease

## 2020-04-26 NOTE — Telephone Encounter (Signed)
Called to discuss rescheduling the CTA chest/abdomen and pelvis ordered by Dr. Malva Limes was a no show/cancel on 04/24/20.

## 2020-04-27 NOTE — Telephone Encounter (Signed)
Spoke with patient regarding the scheduled Friday 05/05/20 CTA chest, abdomen and pelvis 12;30 pm at [facility] time is 12:00pm 1st floor radiology for check in ---Liquids only 4 hours prior to appointment.  Patient voiced  His understanding. :

## 2020-05-03 ENCOUNTER — Ambulatory Visit (HOSPITAL_BASED_OUTPATIENT_CLINIC_OR_DEPARTMENT_OTHER): Payer: Medicare Other

## 2020-05-05 ENCOUNTER — Ambulatory Visit (HOSPITAL_COMMUNITY)
Admission: RE | Admit: 2020-05-05 | Discharge: 2020-05-05 | Disposition: A | Discharge status: Home / Self Care | Payer: Medicare Other | Source: Ambulatory Visit | Attending: Cardiovascular Disease | Admitting: Cardiovascular Disease

## 2020-05-05 ENCOUNTER — Encounter (HOSPITAL_COMMUNITY): Payer: Self-pay

## 2020-05-05 ENCOUNTER — Other Ambulatory Visit: Payer: Self-pay

## 2020-05-05 DIAGNOSIS — I714 Abdominal aortic aneurysm, without rupture, unspecified: Secondary | ICD-10-CM

## 2020-05-05 DIAGNOSIS — K5730 Diverticulosis of large intestine without perforation or abscess without bleeding: Secondary | ICD-10-CM | POA: Diagnosis not present

## 2020-05-05 DIAGNOSIS — I712 Thoracic aortic aneurysm, without rupture, unspecified: Secondary | ICD-10-CM

## 2020-05-05 MED ORDER — IOHEXOL 350 MG/ML SOLN
100.0000 mL | Freq: Once | INTRAVENOUS | Status: AC | PRN
  Administered 2020-05-05: 100 mL via INTRAVENOUS

## 2020-05-09 ENCOUNTER — Other Ambulatory Visit: Payer: Self-pay | Admitting: *Deleted

## 2020-05-09 DIAGNOSIS — I714 Abdominal aortic aneurysm, without rupture, unspecified: Secondary | ICD-10-CM

## 2020-05-09 DIAGNOSIS — I712 Thoracic aortic aneurysm, without rupture, unspecified: Secondary | ICD-10-CM

## 2020-05-10 DIAGNOSIS — M5416 Radiculopathy, lumbar region: Secondary | ICD-10-CM | POA: Diagnosis not present

## 2020-05-10 DIAGNOSIS — I10 Essential (primary) hypertension: Secondary | ICD-10-CM | POA: Diagnosis not present

## 2020-05-10 DIAGNOSIS — M542 Cervicalgia: Secondary | ICD-10-CM | POA: Diagnosis not present

## 2020-05-10 DIAGNOSIS — F4310 Post-traumatic stress disorder, unspecified: Secondary | ICD-10-CM | POA: Diagnosis not present

## 2020-05-10 DIAGNOSIS — R2689 Other abnormalities of gait and mobility: Secondary | ICD-10-CM | POA: Diagnosis not present

## 2020-05-30 ENCOUNTER — Other Ambulatory Visit: Payer: Self-pay | Admitting: Cardiovascular Disease

## 2020-06-23 DIAGNOSIS — M19011 Primary osteoarthritis, right shoulder: Secondary | ICD-10-CM | POA: Diagnosis not present

## 2020-07-28 ENCOUNTER — Observation Stay (HOSPITAL_COMMUNITY): Payer: Medicare Other

## 2020-07-28 ENCOUNTER — Encounter (HOSPITAL_COMMUNITY): Payer: Self-pay

## 2020-07-28 ENCOUNTER — Emergency Department (HOSPITAL_COMMUNITY): Payer: Medicare Other

## 2020-07-28 ENCOUNTER — Inpatient Hospital Stay (HOSPITAL_COMMUNITY)
Admission: EM | Admit: 2020-07-28 | Discharge: 2020-08-01 | DRG: 552 | Disposition: A | Discharge status: Skilled Nursing Facility | Payer: Medicare Other | Attending: Family Medicine | Admitting: Family Medicine

## 2020-07-28 DIAGNOSIS — F40240 Claustrophobia: Secondary | ICD-10-CM | POA: Diagnosis present

## 2020-07-28 DIAGNOSIS — G8929 Other chronic pain: Secondary | ICD-10-CM | POA: Diagnosis present

## 2020-07-28 DIAGNOSIS — F32A Depression, unspecified: Secondary | ICD-10-CM | POA: Diagnosis not present

## 2020-07-28 DIAGNOSIS — I739 Peripheral vascular disease, unspecified: Secondary | ICD-10-CM | POA: Diagnosis not present

## 2020-07-28 DIAGNOSIS — F4310 Post-traumatic stress disorder, unspecified: Secondary | ICD-10-CM | POA: Diagnosis present

## 2020-07-28 DIAGNOSIS — I495 Sick sinus syndrome: Secondary | ICD-10-CM | POA: Diagnosis not present

## 2020-07-28 DIAGNOSIS — R5383 Other fatigue: Secondary | ICD-10-CM | POA: Diagnosis not present

## 2020-07-28 DIAGNOSIS — Z87891 Personal history of nicotine dependence: Secondary | ICD-10-CM

## 2020-07-28 DIAGNOSIS — Z955 Presence of coronary angioplasty implant and graft: Secondary | ICD-10-CM

## 2020-07-28 DIAGNOSIS — M545 Low back pain, unspecified: Secondary | ICD-10-CM | POA: Diagnosis present

## 2020-07-28 DIAGNOSIS — Z79899 Other long term (current) drug therapy: Secondary | ICD-10-CM

## 2020-07-28 DIAGNOSIS — I480 Paroxysmal atrial fibrillation: Secondary | ICD-10-CM | POA: Diagnosis present

## 2020-07-28 DIAGNOSIS — I10 Essential (primary) hypertension: Secondary | ICD-10-CM | POA: Diagnosis not present

## 2020-07-28 DIAGNOSIS — M48061 Spinal stenosis, lumbar region without neurogenic claudication: Secondary | ICD-10-CM | POA: Diagnosis present

## 2020-07-28 DIAGNOSIS — Z20822 Contact with and (suspected) exposure to covid-19: Secondary | ICD-10-CM | POA: Diagnosis present

## 2020-07-28 DIAGNOSIS — N400 Enlarged prostate without lower urinary tract symptoms: Secondary | ICD-10-CM | POA: Diagnosis present

## 2020-07-28 DIAGNOSIS — I471 Supraventricular tachycardia: Secondary | ICD-10-CM | POA: Diagnosis present

## 2020-07-28 DIAGNOSIS — I441 Atrioventricular block, second degree: Secondary | ICD-10-CM | POA: Diagnosis present

## 2020-07-28 DIAGNOSIS — I2510 Atherosclerotic heart disease of native coronary artery without angina pectoris: Secondary | ICD-10-CM | POA: Diagnosis present

## 2020-07-28 DIAGNOSIS — Z950 Presence of cardiac pacemaker: Secondary | ICD-10-CM

## 2020-07-28 DIAGNOSIS — R410 Disorientation, unspecified: Secondary | ICD-10-CM

## 2020-07-28 DIAGNOSIS — K219 Gastro-esophageal reflux disease without esophagitis: Secondary | ICD-10-CM | POA: Diagnosis present

## 2020-07-28 DIAGNOSIS — R29898 Other symptoms and signs involving the musculoskeletal system: Secondary | ICD-10-CM

## 2020-07-28 DIAGNOSIS — M5136 Other intervertebral disc degeneration, lumbar region: Principal | ICD-10-CM | POA: Diagnosis present

## 2020-07-28 DIAGNOSIS — E782 Mixed hyperlipidemia: Secondary | ICD-10-CM | POA: Diagnosis present

## 2020-07-28 DIAGNOSIS — G319 Degenerative disease of nervous system, unspecified: Secondary | ICD-10-CM | POA: Diagnosis not present

## 2020-07-28 DIAGNOSIS — R079 Chest pain, unspecified: Secondary | ICD-10-CM | POA: Diagnosis present

## 2020-07-28 DIAGNOSIS — I517 Cardiomegaly: Secondary | ICD-10-CM | POA: Diagnosis not present

## 2020-07-28 DIAGNOSIS — R531 Weakness: Secondary | ICD-10-CM | POA: Diagnosis not present

## 2020-07-28 DIAGNOSIS — K5900 Constipation, unspecified: Secondary | ICD-10-CM | POA: Diagnosis present

## 2020-07-28 DIAGNOSIS — Z7982 Long term (current) use of aspirin: Secondary | ICD-10-CM

## 2020-07-28 DIAGNOSIS — F419 Anxiety disorder, unspecified: Secondary | ICD-10-CM | POA: Diagnosis present

## 2020-07-28 LAB — CBC WITH DIFFERENTIAL/PLATELET
Abs Immature Granulocytes: 0.01 10*3/uL (ref 0.00–0.07)
Basophils Absolute: 0 10*3/uL (ref 0.0–0.1)
Basophils Relative: 0 %
Eosinophils Absolute: 0.1 10*3/uL (ref 0.0–0.5)
Eosinophils Relative: 1 %
HCT: 40.2 % (ref 39.0–52.0)
Hemoglobin: 13.6 g/dL (ref 13.0–17.0)
Immature Granulocytes: 0 %
Lymphocytes Relative: 16 %
Lymphs Abs: 1.3 10*3/uL (ref 0.7–4.0)
MCH: 31.6 pg (ref 26.0–34.0)
MCHC: 33.8 g/dL (ref 30.0–36.0)
MCV: 93.3 fL (ref 80.0–100.0)
Monocytes Absolute: 0.8 10*3/uL (ref 0.1–1.0)
Monocytes Relative: 10 %
Neutro Abs: 5.6 10*3/uL (ref 1.7–7.7)
Neutrophils Relative %: 73 %
Platelets: 166 10*3/uL (ref 150–400)
RBC: 4.31 MIL/uL (ref 4.22–5.81)
RDW: 13.7 % (ref 11.5–15.5)
WBC: 7.8 10*3/uL (ref 4.0–10.5)
nRBC: 0 % (ref 0.0–0.2)

## 2020-07-28 LAB — TROPONIN I (HIGH SENSITIVITY)
Troponin I (High Sensitivity): 12 ng/L (ref <18)
Troponin I (High Sensitivity): 12 ng/L (ref <18)

## 2020-07-28 LAB — COMPREHENSIVE METABOLIC PANEL
ALT: 15 U/L (ref 0–44)
AST: 22 U/L (ref 15–41)
Albumin: 4.2 g/dL (ref 3.5–5.0)
Alkaline Phosphatase: 53 U/L (ref 38–126)
Anion gap: 8 (ref 5–15)
BUN: 26 mg/dL — ABNORMAL HIGH (ref 8–23)
CO2: 29 mmol/L (ref 22–32)
Calcium: 9.2 mg/dL (ref 8.9–10.3)
Chloride: 103 mmol/L (ref 98–111)
Creatinine, Ser: 1.1 mg/dL (ref 0.61–1.24)
GFR, Estimated: >60 mL/min (ref >60)
Glucose, Bld: 106 mg/dL — ABNORMAL HIGH (ref 70–99)
Potassium: 4.1 mmol/L (ref 3.5–5.1)
Sodium: 140 mmol/L (ref 135–145)
Total Bilirubin: 0.5 mg/dL (ref 0.3–1.2)
Total Protein: 6.7 g/dL (ref 6.5–8.1)

## 2020-07-28 LAB — RAPID URINE DRUG SCREEN, HOSP PERFORMED
Amphetamines: NOT DETECTED
Barbiturates: NOT DETECTED
Benzodiazepines: NOT DETECTED
Cocaine: NOT DETECTED
Opiates: NOT DETECTED
Tetrahydrocannabinol: POSITIVE — AB

## 2020-07-28 LAB — BRAIN NATRIURETIC PEPTIDE: B Natriuretic Peptide: 119.2 pg/mL — ABNORMAL HIGH (ref 0.0–100.0)

## 2020-07-28 LAB — URINALYSIS, ROUTINE W REFLEX MICROSCOPIC
Bilirubin Urine: NEGATIVE
Glucose, UA: NEGATIVE mg/dL
Hgb urine dipstick: NEGATIVE
Ketones, ur: NEGATIVE mg/dL
Leukocytes,Ua: NEGATIVE
Nitrite: NEGATIVE
Protein, ur: NEGATIVE mg/dL
Specific Gravity, Urine: 1.016 (ref 1.005–1.030)
pH: 6 (ref 5.0–8.0)

## 2020-07-28 LAB — TSH: TSH: 1.131 u[IU]/mL (ref 0.350–4.500)

## 2020-07-28 LAB — RESP PANEL BY RT-PCR (FLU A&B, COVID) ARPGX2
Influenza A by PCR: NEGATIVE
Influenza B by PCR: NEGATIVE
SARS Coronavirus 2 by RT PCR: NEGATIVE

## 2020-07-28 LAB — VITAMIN B12: Vitamin B-12: 195 pg/mL (ref 180–914)

## 2020-07-28 MED ORDER — MORPHINE SULFATE (PF) 2 MG/ML IV SOLN
2.0000 mg | Freq: Once | INTRAVENOUS | Status: AC
  Administered 2020-07-28: 2 mg via INTRAVENOUS
  Filled 2020-07-28: qty 1

## 2020-07-28 MED ORDER — ONDANSETRON HCL 4 MG PO TABS: 4.0000 mg | ORAL_TABLET | Freq: Four times a day (QID) | ORAL | Status: DC | PRN

## 2020-07-28 MED ORDER — ACETAMINOPHEN 650 MG RE SUPP: 650.0000 mg | Freq: Four times a day (QID) | RECTAL | Status: DC | PRN

## 2020-07-28 MED ORDER — ATENOLOL 50 MG PO TABS
100.0000 mg | ORAL_TABLET | Freq: Every day | ORAL | Status: DC
  Administered 2020-07-28 – 2020-08-01 (×5): 100 mg via ORAL
  Filled 2020-07-28 (×5): qty 2

## 2020-07-28 MED ORDER — HYDROCODONE-ACETAMINOPHEN 5-325 MG PO TABS
1.0000 | ORAL_TABLET | ORAL | Status: DC | PRN
  Administered 2020-07-28 (×2): 1 via ORAL
  Filled 2020-07-28 (×2): qty 1

## 2020-07-28 MED ORDER — ATORVASTATIN CALCIUM 40 MG PO TABS
80.0000 mg | ORAL_TABLET | Freq: Every day | ORAL | Status: DC
  Administered 2020-07-28 – 2020-07-31 (×4): 80 mg via ORAL
  Filled 2020-07-28 (×4): qty 2

## 2020-07-28 MED ORDER — GABAPENTIN 400 MG PO CAPS
800.0000 mg | ORAL_CAPSULE | Freq: Three times a day (TID) | ORAL | Status: DC
  Administered 2020-07-28 – 2020-08-01 (×11): 800 mg via ORAL
  Filled 2020-07-28 (×11): qty 2

## 2020-07-28 MED ORDER — ENOXAPARIN SODIUM 40 MG/0.4ML IJ SOSY
40.0000 mg | PREFILLED_SYRINGE | INTRAMUSCULAR | Status: DC
  Administered 2020-07-28 – 2020-07-31 (×4): 40 mg via SUBCUTANEOUS
  Filled 2020-07-28 (×4): qty 0.4

## 2020-07-28 MED ORDER — ASPIRIN EC 81 MG PO TBEC
81.0000 mg | DELAYED_RELEASE_TABLET | Freq: Every day | ORAL | Status: DC
  Administered 2020-07-28 – 2020-08-01 (×5): 81 mg via ORAL
  Filled 2020-07-28 (×5): qty 1

## 2020-07-28 MED ORDER — HYDRALAZINE HCL 50 MG PO TABS
50.0000 mg | ORAL_TABLET | Freq: Three times a day (TID) | ORAL | Status: DC
  Administered 2020-07-28 – 2020-08-01 (×11): 50 mg via ORAL
  Filled 2020-07-28 (×11): qty 1

## 2020-07-28 MED ORDER — SODIUM CHLORIDE 0.9 % IV SOLN: INTRAVENOUS | Status: DC

## 2020-07-28 MED ORDER — ACETAMINOPHEN 325 MG PO TABS: 650.0000 mg | ORAL_TABLET | Freq: Four times a day (QID) | ORAL | Status: DC | PRN

## 2020-07-28 MED ORDER — ONDANSETRON HCL 4 MG/2ML IJ SOLN: 4.0000 mg | Freq: Four times a day (QID) | INTRAMUSCULAR | Status: DC | PRN

## 2020-07-28 MED ORDER — HYDRALAZINE HCL 20 MG/ML IJ SOLN
10.0000 mg | Freq: Once | INTRAMUSCULAR | Status: AC
  Administered 2020-07-28: 10 mg via INTRAVENOUS
  Filled 2020-07-28: qty 1

## 2020-07-28 MED ORDER — SODIUM CHLORIDE 0.9 % IV SOLN: Freq: Once | INTRAVENOUS | Status: AC

## 2020-07-28 NOTE — Progress Notes (Signed)
Patient is on and off confused and not a great historian. I asked if I could call his wife and he stated that his wife is currently in a nursing home. Unable to complete all of admission history at this time.
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
## 2020-07-28 NOTE — Progress Notes (Signed)
Pt continually says "I don't know what you are saying." He started saying that over and over when I entered the room. I introduced myself and told him I needed to do the nursing admission history for the nurses and he kept saying "I don't know what you are saying." I asked him if I could call his family and ask them information and he said "No."  Unable to complete all of nursing admission history. Obtained available information in chart and computer. Lucius Conn BSN, RN-BC Admissions RN 07/28/2020 4:09 PM

## 2020-07-28 NOTE — ED Triage Notes (Signed)
Pt presents to the ED via EMS for fatigue x2 days and has not taken his blood pressure medications in two days, per EMS. The pt's son called EMS, however, it is uncertain who the pt lives with. Pt denies any accompanying complaints and is otherwise extremely hard of hearing. Hx limited.
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
## 2020-07-28 NOTE — ED Notes (Signed)
Provider at the bedside to evaluate.

## 2020-07-28 NOTE — Plan of Care (Signed)
  Problem: Coping: Goal: Level of anxiety will decrease Outcome: Progressing   Problem: Elimination: Goal: Will not experience complications related to urinary retention Outcome: Progressing   Problem: Pain Managment: Goal: General experience of comfort will improve Outcome: Progressing   

## 2020-07-29 ENCOUNTER — Other Ambulatory Visit: Payer: Self-pay

## 2020-07-29 DIAGNOSIS — R29898 Other symptoms and signs involving the musculoskeletal system: Secondary | ICD-10-CM | POA: Diagnosis not present

## 2020-07-29 LAB — CBC
HCT: 40.2 % (ref 39.0–52.0)
Hemoglobin: 13.2 g/dL (ref 13.0–17.0)
MCH: 30.9 pg (ref 26.0–34.0)
MCHC: 32.8 g/dL (ref 30.0–36.0)
MCV: 94.1 fL (ref 80.0–100.0)
Platelets: 191 10*3/uL (ref 150–400)
RBC: 4.27 MIL/uL (ref 4.22–5.81)
RDW: 14 % (ref 11.5–15.5)
WBC: 6.2 10*3/uL (ref 4.0–10.5)
nRBC: 0 % (ref 0.0–0.2)

## 2020-07-29 LAB — COMPREHENSIVE METABOLIC PANEL
ALT: 13 U/L (ref 0–44)
AST: 19 U/L (ref 15–41)
Albumin: 3.8 g/dL (ref 3.5–5.0)
Alkaline Phosphatase: 51 U/L (ref 38–126)
Anion gap: 6 (ref 5–15)
BUN: 15 mg/dL (ref 8–23)
CO2: 30 mmol/L (ref 22–32)
Calcium: 9.1 mg/dL (ref 8.9–10.3)
Chloride: 104 mmol/L (ref 98–111)
Creatinine, Ser: 1.04 mg/dL (ref 0.61–1.24)
GFR, Estimated: >60 mL/min (ref >60)
Glucose, Bld: 101 mg/dL — ABNORMAL HIGH (ref 70–99)
Potassium: 4.1 mmol/L (ref 3.5–5.1)
Sodium: 140 mmol/L (ref 135–145)
Total Bilirubin: 0.6 mg/dL (ref 0.3–1.2)
Total Protein: 6.5 g/dL (ref 6.5–8.1)

## 2020-07-29 MED ORDER — POLYETHYLENE GLYCOL 3350 17 G PO PACK
17.0000 g | PACK | Freq: Once | ORAL | Status: AC
  Administered 2020-07-29: 17 g via ORAL
  Filled 2020-07-29: qty 1

## 2020-07-29 MED ORDER — HYDROCODONE-ACETAMINOPHEN 5-325 MG PO TABS
1.0000 | ORAL_TABLET | ORAL | Status: DC | PRN
  Administered 2020-07-31: 1 via ORAL
  Filled 2020-07-29: qty 1

## 2020-07-29 MED ORDER — POLYETHYLENE GLYCOL 3350 17 G PO PACK: 17.0000 g | PACK | Freq: Every day | ORAL | Status: DC

## 2020-07-29 NOTE — Evaluation (Addendum)
Physical Therapy Evaluation Patient Details Name: Kevin Wiley MRN: [id number] DOB: 13-Oct-1940 Today's Date: 07/29/2020   History of Present Illness  80 y.o. male admitted for RLE weakness and back pain. PMH: bilateral renal artery stenosis s/p stents in 1999, BPH, coronary artery disease with BMS to LAD, chronic low back pain, chronic disease, hyperlipidemia, hypertension, pacemaker placement, paroxysmal atrial fibrillation, PTSD. CT head negative  Clinical Impression  Pt admitted with above diagnosis.  Pt is extremely HOH, it is difficult to get a clear picture of his baseline. Pt insists he cannot walk with a walker and does not use a RW at home, he prefers to hold on to furniture. Pt is alert and oriented however does appear to have some cognitive deficits, becomes mildly agitated with questions regarding functional status and gives vague answers. Pt denies RLE weakness at time of PT eval, appears to have generalized symmetrical LE weakness.  Recommend SNF vs HHPT depending on progress and assist available at home.  Pt may be at or near his baseline--? Per previous notes pt and his wife have been experiencing a functional decline. Pt reports his wife is home alone and managing, he does not state if either of them drive.    Pt currently with functional limitations due to the deficits listed below (see PT Problem List). Pt will benefit from skilled PT to increase their independence and safety with mobility to allow discharge to the venue listed below.       Follow Up Recommendations Home health PT;SNF    Equipment Recommendations  None recommended by PT    Recommendations for Other Services       Precautions / Restrictions Precautions Precautions: Fall Restrictions Weight Bearing Restrictions: No      Mobility  Bed Mobility Overal bed mobility: Needs Assistance Bed Mobility: Supine to Sit     Supine to sit: Min guard     General bed mobility comments: incr time, 2  attempts, min guard to elevate trunk    Transfers Overall transfer level: Needs assistance Equipment used: Rolling walker (2 wheeled);None Transfers: Sit to/from Stand Sit to Stand: Min assist;Mod assist         General transfer comment: 2 standing trials, decr assist needed on second trial. pt unsteady in standing without support, reluctantly agreeable to stand with RW  Ambulation/Gait Ambulation/Gait assistance: Min assist Gait Distance (Feet): 6 Feet Assistive device: Rolling walker (2 wheeled) Gait Pattern/deviations: Step-through pattern;Decreased stride length;Narrow base of support;Trunk flexed     General Gait Details: cues for use of RW, min assist to steady takign steps forward and back to chair. pt declines amb to hallway and also will not wear a mask  Stairs            Wheelchair Mobility    Modified Rankin (Stroke Patients Only)       Balance Overall balance assessment: Needs assistance (pt does not answer questions regarding hx of falls) Sitting-balance support: Feet supported Sitting balance-Leahy Scale: Fair       Standing balance-Leahy Scale: Poor Standing balance comment: reliant on UEs                             Pertinent Vitals/Pain Pain Assessment: Faces Faces Pain Scale: Hurts little more Pain Location: back Pain Descriptors / Indicators: Sore Pain Intervention(s): Monitored during session    Home Living Family/patient expects to be discharged to:: Unsure Living Arrangements: Spouse/significant other Available Help at Discharge:  Family Type of Home: House         Home Equipment: Walker - 2 wheels;Walker - 4 wheels Additional Comments: pt gives vague answers to most questions. no family present. per previous notes from 2020---pt and wife were havign difficulty functioning and needing more help at home per son    Prior Function     Gait / Transfers Assistance Needed: pt states he doesn't walk much. he states he is  afraid of using RW and "cannot walk with that thing!". pt prefers to hold on to furniture  ADL's / Homemaking Assistance Needed: pt does not repsond to questions reagrding ADLs, repeatedly states "the best I can" when  asked how he performs specific tasks        Hand Dominance        Extremity/Trunk Assessment   Upper Extremity Assessment Upper Extremity Assessment: Generalized weakness    Lower Extremity Assessment Lower Extremity Assessment: Generalized weakness (overall ROM WFL, denies issues or pain with RLE)       Communication   Communication: HOH  Cognition Arousal/Alertness: Awake/alert Behavior During Therapy: Flat affect Overall Cognitive Status: No family/caregiver present to determine baseline cognitive functioning Area of Impairment: Following commands;Problem solving                       Following Commands: Follows one step commands with increased time;Follows multi-step commands inconsistently     Problem Solving: Slow processing;Requires verbal cues;Requires tactile cues General Comments: pt oriented to "hospital", year and self. however is an unreliable historian, not forthcoming with information regarding PLOF      General Comments      Exercises     Assessment/Plan    PT Assessment Patient needs continued PT services  PT Problem List Decreased strength;Decreased balance;Decreased activity tolerance;Decreased mobility;Decreased cognition       PT Treatment Interventions DME instruction;Therapeutic exercise;Gait training;Functional mobility training;Therapeutic activities;Patient/family education    PT Goals (Current goals can be found in the Care Plan section)  Acute Rehab PT Goals Patient Stated Goal: none stated PT Goal Formulation: With patient Time For Goal Achievement: 08/11/20 Potential to Achieve Goals: Fair    Frequency Min 2X/week   Barriers to discharge        Co-evaluation               AM-PAC PT "6  Clicks" Mobility  Outcome Measure Help needed turning from your back to your side while in a flat bed without using bedrails?: A Little Help needed moving from lying on your back to sitting on the side of a flat bed without using bedrails?: A Little Help needed moving to and from a bed to a chair (including a wheelchair)?: A Little Help needed standing up from a chair using your arms (e.g., wheelchair or bedside chair)?: A Little Help needed to walk in hospital room?: A Little Help needed climbing 3-5 steps with a railing? : A Lot 6 Click Score: 17    End of Session Equipment Utilized During Treatment: Gait belt Activity Tolerance: Patient limited by fatigue Patient left: in chair;with call bell/phone within reach;with chair alarm set Nurse Communication: Mobility status PT Visit Diagnosis: Other abnormalities of gait and mobility (R26.89);Difficulty in walking, not elsewhere classified (R26.2);Muscle weakness (generalized) (M62.81)    Time: 6226-3335 PT Time Calculation (min) (ACUTE ONLY): 17 min   Charges:   PT Evaluation $PT Eval Low Complexity: 1 Low          Freyja Govea, PT  Acute Rehab Dept [facility]) [phone] Pager [phone]  07/29/2020   [facility] 07/29/2020, 12:49 PM

## 2020-07-29 NOTE — Progress Notes (Signed)
PROGRESS NOTE    Kevin Wiley  PZW:[id number] DOB: 07/03/1940 DOA: 07/28/2020 PCP: Burnard Bunting, MD   Brief Narrative:  Kevin Wiley  is a 80 y.o. male, with medical history of bilateral renal artery stenosis s/p stents in 1999, BPH, coronary artery disease with BMS to LAD, chronic low back pain, chronic disease, hyperlipidemia, hypertension, pacemaker placement, paroxysmal atrial fibrillation, came to hospital for low back pain and right leg weakness.  Patient states that weakness has been going on for long time, however he was unable to tell me the exact date or time when weakness started.  Patient has hearing loss and history is limited otherwise he is able to answer questions appropriately.  Patient was unable to get out of bed and after son called EMS. He denies chest pain or shortness of breath Denies nausea vomiting or diarrhea Denies dysuria or abdominal pain   As per ED notes, patient's son noticed that patient had been dragging his right leg.  Also A. fib with rapid father has developed some cognitive decline and has had more repetitive speech gets easily confused.   CT head was negative, MRI brain could not be obtained as patient has pacemaker in place  Assessment & Plan:   Active Problems:   Right leg weakness  Right leg weakness/chronic low back pain: Patient states that his low back pain is chronic but cannot tell me the exact time with certainty.  Poor historian.  No other focal deficit so I personally doubt any stroke, CT head negative and MRI has not indicated.  CT lumbar spine shows multilevel degenerative changes with mild to moderate canal stenosis at L3-L4 with left foraminal stenosis and nothing acute or severe.  Interestingly, his symptoms are on the right side.  Likely due to degenerative disc disease.  We will consult PT OT to assess him.  Cognitive decline/decreased hearing: Seems to have some mild underlying cognitive deficit and he is already hard of hearing.   However he is able to communicate well.  Essential hypertension: Blood pressure fairly controlled, continue current scheduled hydralazine and atenolol.  History of CAD s/p stent: Asymptomatic, continue atenolol, aspirin and Lipitor.  SSS/second-degree AV block/PAT/RAS: Has dual-chamber permanent pacemaker.  Follows with cardiology as outpatient.  DVT prophylaxis: enoxaparin (LOVENOX) injection 40 mg Start: 07/28/20 2200   Code Status: Full Code  Family Communication: None present at bedside.  Plan of care discussed with patient in length and he verbalized understanding and agreed with it.  Status is: Observation  The patient will require care spanning > 2 midnights and should be moved to inpatient because: Unsafe d/c plan  Dispo: The patient is from: Home              Anticipated d/c is to: Home              Patient currently is not medically stable to d/c.   Difficult to place patient No        Estimated body mass index is 22.5 kg/m as calculated from the following:   Height as of this encounter: 5' 9"  (1.753 m).   Weight as of this encounter: 69.1 kg.      Nutritional status:               Consultants:  None  Procedures:  None  Antimicrobials:  Anti-infectives (From admission, onward)    None          Subjective: Seen and examined.  Still complains of weakness in the  right lower extremity.  Complains of low back pain as well.  Objective: Vitals:   07/28/20 2111 07/29/20 0203 07/29/20 0616 07/29/20 0936  BP: (!) 160/79 115/73 (!) 158/97 (!) 153/85  Pulse: 60 60 61 66  Resp: 16 16 16 20   Temp: 98.5 F (36.9 C) (!) 97.4 F (36.3 C) 97.8 F (36.6 C) 98.3 F (36.8 C)  TempSrc: Oral Oral Oral Oral  SpO2: 100% 97% 100% 100%  Weight:      Height:        Intake/Output Summary (Last 24 hours) at 07/29/2020 1158 Last data filed at 07/28/2020 2114 Gross per 24 hour  Intake --  Output 1000 ml  Net -1000 ml   Filed Weights   07/28/20 1753   Weight: 69.1 kg    Examination:  General exam: Appears calm and comfortable, hard of hearing Respiratory system: Clear to auscultation. Respiratory effort normal. Cardiovascular system: S1 & S2 heard, RRR. No JVD, murmurs, rubs, gallops or clicks. No pedal edema. Gastrointestinal system: Abdomen is nondistended, soft and nontender. No organomegaly or masses felt. Normal bowel sounds heard. Central nervous system: Alert and oriented. No focal neurological deficits but only slightly decreased strength in the right lower extremity. Extremities: Symmetric 5 x 5 power. Skin: No rashes, lesions or ulcers Psychiatry: Judgement and insight appear poor   Data Reviewed: I have personally reviewed following labs and imaging studies  CBC: Recent Labs  Lab 07/28/20 1300 07/29/20 0554  WBC 7.8 6.2  NEUTROABS 5.6  --   HGB 13.6 13.2  HCT 40.2 40.2  MCV 93.3 94.1  PLT 166 572   Basic Metabolic Panel: Recent Labs  Lab 07/28/20 1300 07/29/20 0554  NA 140 140  K 4.1 4.1  CL 103 104  CO2 29 30  GLUCOSE 106* 101*  BUN 26* 15  CREATININE 1.10 1.04  CALCIUM 9.2 9.1   GFR: Estimated Creatinine Clearance: 57.2 mL/min (by C-G formula based on SCr of 1.04 mg/dL). Liver Function Tests: Recent Labs  Lab 07/28/20 1300 07/29/20 0554  AST 22 19  ALT 15 13  ALKPHOS 53 51  BILITOT 0.5 0.6  PROT 6.7 6.5  ALBUMIN 4.2 3.8   No results for input(s): LIPASE, AMYLASE in the last 168 hours. No results for input(s): AMMONIA in the last 168 hours. Coagulation Profile: No results for input(s): INR, PROTIME in the last 168 hours. Cardiac Enzymes: No results for input(s): CKTOTAL, CKMB, CKMBINDEX, TROPONINI in the last 168 hours. BNP (last 3 results) No results for input(s): PROBNP in the last 8760 hours. HbA1C: No results for input(s): HGBA1C in the last 72 hours. CBG: No results for input(s): GLUCAP in the last 168 hours. Lipid Profile: No results for input(s): CHOL, HDL, LDLCALC, TRIG,  CHOLHDL, LDLDIRECT in the last 72 hours. Thyroid Function Tests: Recent Labs    07/28/20 1903  TSH 1.131   Anemia Panel: Recent Labs    07/28/20 1903  VITAMINB12 195   Sepsis Labs: No results for input(s): PROCALCITON, LATICACIDVEN in the last 168 hours.  Recent Results (from the past 240 hour(s))  Resp Panel by RT-PCR (Flu A&B, Covid) Nasopharyngeal Swab     Status: None   Collection Time: 07/28/20  1:28 PM   Specimen: Nasopharyngeal Swab; Nasopharyngeal(NP) swabs in vial transport medium  Result Value Ref Range Status   SARS Coronavirus 2 by RT PCR NEGATIVE NEGATIVE Final    Comment: (NOTE) SARS-CoV-2 target nucleic acids are NOT DETECTED.  The SARS-CoV-2 RNA is generally detectable in  upper respiratory specimens during the acute phase of infection. The lowest concentration of SARS-CoV-2 viral copies this assay can detect is 138 copies/mL. A negative result does not preclude SARS-Cov-2 infection and should not be used as the sole basis for treatment or other patient management decisions. A negative result may occur with  improper specimen collection/handling, submission of specimen other than nasopharyngeal swab, presence of viral mutation(s) within the areas targeted by this assay, and inadequate number of viral copies(<138 copies/mL). A negative result must be combined with clinical observations, patient history, and epidemiological information. The expected result is Negative.  Fact Sheet for Patients:  EntrepreneurPulse.com.au  Fact Sheet for Healthcare Providers:  IncredibleEmployment.be  This test is no t yet approved or cleared by the [place] FDA and  has been authorized for detection and/or diagnosis of SARS-CoV-2 by FDA under an Emergency Use Authorization (EUA). This EUA will remain  in effect (meaning this test can be used) for the duration of the COVID-19 declaration under Section 564(b)(1) of the Act,  21 U.S.C.section 360bbb-3(b)(1), unless the authorization is terminated  or revoked sooner.       Influenza A by PCR NEGATIVE NEGATIVE Final   Influenza B by PCR NEGATIVE NEGATIVE Final    Comment: (NOTE) The Xpert Xpress SARS-CoV-2/FLU/RSV plus assay is intended as an aid in the diagnosis of influenza from Nasopharyngeal swab specimens and should not be used as a sole basis for treatment. Nasal washings and aspirates are unacceptable for Xpert Xpress SARS-CoV-2/FLU/RSV testing.  Fact Sheet for Patients: EntrepreneurPulse.com.au  Fact Sheet for Healthcare Providers: IncredibleEmployment.be  This test is not yet approved or cleared by the [place] FDA and has been authorized for detection and/or diagnosis of SARS-CoV-2 by FDA under an Emergency Use Authorization (EUA). This EUA will remain in effect (meaning this test can be used) for the duration of the COVID-19 declaration under Section 564(b)(1) of the Act, 21 U.S.C. section 360bbb-3(b)(1), unless the authorization is terminated or revoked.  Performed at [facility], [place] [address], [place], [place] [address]       Radiology Studies: CT Head Wo Contrast  Result Date: 07/28/2020 CLINICAL DATA:  Fatigue x2 days. EXAM: CT HEAD WITHOUT CONTRAST TECHNIQUE: Contiguous axial images were obtained from the base of the skull through the vertex without intravenous contrast. COMPARISON:  February 16, 2020 FINDINGS: Brain: There is mild cerebral atrophy with widening of the extra-axial spaces and ventricular dilatation. There are areas of decreased attenuation within the white matter tracts of the supratentorial brain, consistent with microvascular disease changes. Vascular: No hyperdense vessel or unexpected calcification. Skull: Normal. Negative for fracture or focal lesion. Sinuses/Orbits: No acute finding. Other: None. IMPRESSION: 1. Generalized cerebral atrophy. 2. No acute  intracranial abnormality. Electronically Signed   By: Virgina Norfolk M.D.   On: 07/28/2020 15:33   CT LUMBAR SPINE WO CONTRAST  Result Date: 07/28/2020 CLINICAL DATA:  Back pain EXAM: CT LUMBAR SPINE WITHOUT CONTRAST TECHNIQUE: Multidetector CT imaging of the lumbar spine was performed without intravenous contrast administration. Multiplanar CT image reconstructions were also generated. COMPARISON:  CT lumbar spine 02/15/2017. FINDINGS: Segmentation: 5 non rib-bearing lumbar vertebral bodies. Alignment: No substantial sagittal subluxation. Mild dextrocurvature in the upper lumbar spine with slight compensatory levocurvature in the lower lumbar spine. Vertebrae: Similar vertebral body heights. No evidence of acute fracture. Degenerative Schmorl's node involving the superior L5 endplate. Paraspinal and other soft tissues: In comparison to recent CTA from May 05, 2020, similar size of an abdominal  aortic aneurysm. Disc levels: T12-L1: Mild facet hypertrophy and disc bulging without significant stenosis. L1-L2: Mild facet hypertrophy and disc bulging without significant stenosis. L2-L3: Left greater than right facet hypertrophy. Left eccentric disc bulge with similar mild left foraminal and left subarticular recess stenosis. L3-L4: Broad disc bulge with moderate bilateral facet hypertrophy and ligamentum flavum thickening. Left foraminal disc protrusion which likely contacts the exiting left L3 nerve with at least moderate left foraminal stenosis which is likely progressed. Bilateral subarticular recess stenosis with at least mild-to-moderate canal stenosis. L4-L5: Broad disc bulge with ligamentum flavum thickening and moderate to severe facet hypertrophy. Bilateral subarticular recess and and likely moderate foraminal stenosis. At least mild canal stenosis. L5-S1: Broad disc bulge and moderate facet hypertrophy, worse on the right. Likely mild subarticular recess stenosis without significant canal stenosis.  IMPRESSION: 1. Multilevel degenerative change without evidence of acute bony abnormality. 2. At L3-L4, there is at least mild to moderate canal stenosis with bilateral subarticular recess stenosis and at least moderate left foraminal stenosis. 3. At L4-L5 there is at least mild canal stenosis with bilateral subarticular recess stenosis and likely moderate bilateral foraminal stenosis. 4. At L3-L4, there is likely mild left foraminal and subarticular recess stenosis. 5. If the patient is able, an MRI could better characterize the canal and foramina. 6. In comparison to recent CTA from May 05, 2020, similar size of an abdominal aortic aneurysm. Please see that study for characterization and follow-up recommendations. Electronically Signed   By: Margaretha Sheffield MD   On: 07/28/2020 18:01   DG Chest Port 1 View  Result Date: 07/28/2020 CLINICAL DATA:  Malaise EXAM: PORTABLE CHEST 1 VIEW COMPARISON:  02/15/2017 FINDINGS: Cardiomegaly. Left chest multi lead pacer. Both lungs are clear. The visualized skeletal structures are unremarkable. IMPRESSION: Cardiomegaly without acute abnormality of the lungs in AP portable projection. Electronically Signed   By: Eddie Candle M.D.   On: 07/28/2020 13:59    Scheduled Meds:  aspirin EC  81 mg Oral Daily   atenolol  100 mg Oral Daily   atorvastatin  80 mg Oral QHS   enoxaparin (LOVENOX) injection  40 mg Subcutaneous Q24H   gabapentin  800 mg Oral TID   hydrALAZINE  50 mg Oral TID   Continuous Infusions:  sodium chloride       LOS: 0 days   Time spent: 34 minutes   Darliss Cheney, MD [facility]  07/29/2020, 11:58 AM   How to contact the [facility] Attending or Consulting provider [place] or covering provider during after hours [place], for this patient?  Check the care team in [facility] and look for a) attending/consulting TRH provider listed and b) the [facility] team listed. Page or secure chat [phone]. Log into [email or web address] and use [place]'s universal password to  access. If you do not have the password, please contact the hospital operator. Locate the [facility] provider you are looking for under [facility] and page to a number that you can be directly reached. If you still have difficulty reaching the provider, please page the [facility] (Director on Call) for the Hospitalists listed on amion for assistance.

## 2020-07-30 DIAGNOSIS — I471 Supraventricular tachycardia: Secondary | ICD-10-CM | POA: Diagnosis present

## 2020-07-30 DIAGNOSIS — M1611 Unilateral primary osteoarthritis, right hip: Secondary | ICD-10-CM | POA: Diagnosis not present

## 2020-07-30 DIAGNOSIS — M5442 Lumbago with sciatica, left side: Secondary | ICD-10-CM | POA: Diagnosis not present

## 2020-07-30 DIAGNOSIS — R29898 Other symptoms and signs involving the musculoskeletal system: Secondary | ICD-10-CM | POA: Diagnosis not present

## 2020-07-30 DIAGNOSIS — M5136 Other intervertebral disc degeneration, lumbar region: Secondary | ICD-10-CM | POA: Diagnosis present

## 2020-07-30 DIAGNOSIS — M48061 Spinal stenosis, lumbar region without neurogenic claudication: Secondary | ICD-10-CM | POA: Diagnosis present

## 2020-07-30 DIAGNOSIS — I459 Conduction disorder, unspecified: Secondary | ICD-10-CM | POA: Diagnosis not present

## 2020-07-30 DIAGNOSIS — Z9181 History of falling: Secondary | ICD-10-CM | POA: Diagnosis not present

## 2020-07-30 DIAGNOSIS — I2510 Atherosclerotic heart disease of native coronary artery without angina pectoris: Secondary | ICD-10-CM | POA: Diagnosis present

## 2020-07-30 DIAGNOSIS — R41841 Cognitive communication deficit: Secondary | ICD-10-CM | POA: Diagnosis not present

## 2020-07-30 DIAGNOSIS — I701 Atherosclerosis of renal artery: Secondary | ICD-10-CM | POA: Diagnosis not present

## 2020-07-30 DIAGNOSIS — E782 Mixed hyperlipidemia: Secondary | ICD-10-CM | POA: Diagnosis present

## 2020-07-30 DIAGNOSIS — I69828 Other speech and language deficits following other cerebrovascular disease: Secondary | ICD-10-CM | POA: Diagnosis not present

## 2020-07-30 DIAGNOSIS — F40240 Claustrophobia: Secondary | ICD-10-CM | POA: Diagnosis present

## 2020-07-30 DIAGNOSIS — K219 Gastro-esophageal reflux disease without esophagitis: Secondary | ICD-10-CM | POA: Diagnosis present

## 2020-07-30 DIAGNOSIS — R531 Weakness: Secondary | ICD-10-CM | POA: Diagnosis not present

## 2020-07-30 DIAGNOSIS — Z955 Presence of coronary angioplasty implant and graft: Secondary | ICD-10-CM | POA: Diagnosis not present

## 2020-07-30 DIAGNOSIS — I495 Sick sinus syndrome: Secondary | ICD-10-CM | POA: Diagnosis present

## 2020-07-30 DIAGNOSIS — G8929 Other chronic pain: Secondary | ICD-10-CM | POA: Diagnosis present

## 2020-07-30 DIAGNOSIS — R2681 Unsteadiness on feet: Secondary | ICD-10-CM | POA: Diagnosis not present

## 2020-07-30 DIAGNOSIS — I160 Hypertensive urgency: Secondary | ICD-10-CM | POA: Diagnosis not present

## 2020-07-30 DIAGNOSIS — M6281 Muscle weakness (generalized): Secondary | ICD-10-CM | POA: Diagnosis not present

## 2020-07-30 DIAGNOSIS — Z7401 Bed confinement status: Secondary | ICD-10-CM | POA: Diagnosis not present

## 2020-07-30 DIAGNOSIS — F32A Depression, unspecified: Secondary | ICD-10-CM | POA: Diagnosis present

## 2020-07-30 DIAGNOSIS — F4310 Post-traumatic stress disorder, unspecified: Secondary | ICD-10-CM | POA: Diagnosis present

## 2020-07-30 DIAGNOSIS — I10 Essential (primary) hypertension: Secondary | ICD-10-CM | POA: Diagnosis present

## 2020-07-30 DIAGNOSIS — Z950 Presence of cardiac pacemaker: Secondary | ICD-10-CM | POA: Diagnosis not present

## 2020-07-30 DIAGNOSIS — R1312 Dysphagia, oropharyngeal phase: Secondary | ICD-10-CM | POA: Diagnosis not present

## 2020-07-30 DIAGNOSIS — Z87891 Personal history of nicotine dependence: Secondary | ICD-10-CM | POA: Diagnosis not present

## 2020-07-30 DIAGNOSIS — Z20822 Contact with and (suspected) exposure to covid-19: Secondary | ICD-10-CM | POA: Diagnosis present

## 2020-07-30 DIAGNOSIS — I739 Peripheral vascular disease, unspecified: Secondary | ICD-10-CM | POA: Diagnosis present

## 2020-07-30 DIAGNOSIS — N400 Enlarged prostate without lower urinary tract symptoms: Secondary | ICD-10-CM | POA: Diagnosis present

## 2020-07-30 DIAGNOSIS — Z79899 Other long term (current) drug therapy: Secondary | ICD-10-CM | POA: Diagnosis not present

## 2020-07-30 DIAGNOSIS — I480 Paroxysmal atrial fibrillation: Secondary | ICD-10-CM | POA: Diagnosis present

## 2020-07-30 DIAGNOSIS — D649 Anemia, unspecified: Secondary | ICD-10-CM | POA: Diagnosis not present

## 2020-07-30 DIAGNOSIS — Z7982 Long term (current) use of aspirin: Secondary | ICD-10-CM | POA: Diagnosis not present

## 2020-07-30 DIAGNOSIS — I441 Atrioventricular block, second degree: Secondary | ICD-10-CM | POA: Diagnosis present

## 2020-07-30 DIAGNOSIS — K5900 Constipation, unspecified: Secondary | ICD-10-CM | POA: Diagnosis present

## 2020-07-30 DIAGNOSIS — I951 Orthostatic hypotension: Secondary | ICD-10-CM | POA: Diagnosis not present

## 2020-07-30 MED ORDER — DOCUSATE SODIUM 100 MG PO CAPS
100.0000 mg | ORAL_CAPSULE | Freq: Two times a day (BID) | ORAL | Status: DC
  Administered 2020-07-30 – 2020-08-01 (×4): 100 mg via ORAL
  Filled 2020-07-30 (×4): qty 1

## 2020-07-30 MED ORDER — POLYETHYLENE GLYCOL 3350 17 G PO PACK
17.0000 g | PACK | Freq: Every day | ORAL | Status: DC
  Administered 2020-07-30 – 2020-08-01 (×3): 17 g via ORAL
  Filled 2020-07-30 (×3): qty 1

## 2020-07-30 MED ORDER — ZOLPIDEM TARTRATE 10 MG PO TABS
10.0000 mg | ORAL_TABLET | Freq: Once | ORAL | Status: AC
  Administered 2020-07-30: 10 mg via ORAL
  Filled 2020-07-30: qty 1

## 2020-07-30 MED ORDER — BISACODYL 10 MG RE SUPP
10.0000 mg | Freq: Every day | RECTAL | Status: DC | PRN
  Administered 2020-07-31: 10 mg via RECTAL
  Filled 2020-07-30: qty 1

## 2020-07-30 NOTE — Evaluation (Signed)
Occupational Therapy Evaluation Patient Details Name: Kevin Wiley MRN: [id number] DOB: Jun 19, 1940 Today's Date: 07/30/2020    History of Present Illness 80 y.o. male admitted for RLE weakness and back pain. PMH: bilateral renal artery stenosis s/p stents in 1999, BPH, coronary artery disease with BMS to LAD, chronic low back pain, chronic disease, hyperlipidemia, hypertension, pacemaker placement, paroxysmal atrial fibrillation, PTSD. CT head negative   Clinical Impression   Patient is currently requiring assistance with ADLs including minimal assist with toileting,  with LE dressing in standing, and with bathing, and setup assist with supervision with seated grooming, UE dressing and U sponge bathing, all of which pt states is below patient's typical baseline, however true baseline is unknown and pt unable to give much history.  Possible gradual declined at home until pt was unable to care for himself.   During this evaluation, patient was limited by generalized weakness, unsteadiness and reluctance to use beneficial DME such as RW as well as cognitive and hearing deficits, which has the potential to impact patient's safety and independence during functional mobility, as well as performance for ADLs. [place] "6-clicks" Daily Activity Inpatient Short Form score of 19/24 this session. Patient lives alone since spouse went into nursing home/SNF, and pt reports that "I can't go home. There's no way I can function." Patient demonstrates good rehab potential, and should benefit from continued skilled occupational therapy services while in acute care to maximize safety, independence and quality of life at home.  Continued occupational therapy services in a SNF setting prior to return home is recommended. Pt reports that he would refuse home health as "My home is not setup for people to come." ?    Follow Up Recommendations  SNF (Pt requesting [facility] if possible)    Equipment  Recommendations  Other (comment) (Will defer to post-acute recommendations.)    Recommendations for Other Services       Precautions / Restrictions Precautions Precautions: Fall Restrictions Weight Bearing Restrictions: No      Mobility Bed Mobility               General bed mobility comments: Pt in recliner.    Transfers Overall transfer level: Needs assistance Equipment used: 1 person hand held assist Transfers: Sit to/from Stand Sit to Stand: Min assist;Mod assist         General transfer comment: Min As to stand from recliner, Mod as to stand from low commode. Pt performed in-room ambulation with 1 hand held assist and reaching with other hand for furniture as needed.    Balance Overall balance assessment: Needs assistance;History of Falls (Pt nodded when asked if he has recently fallen.) Sitting-balance support: Feet supported Sitting balance-Leahy Scale: Fair       Standing balance-Leahy Scale: Poor Standing balance comment: Relaint on 1-2 hand support and external assistance.                           ADL either performed or assessed with clinical judgement   ADL Overall ADL's : Needs assistance/impaired Eating/Feeding: Independent;Sitting   Grooming: Wash/dry hands;Standing;Min guard   Upper Body Bathing: Set up;Sitting   Lower Body Bathing: Minimal assistance;Sitting/lateral leans;Sit to/from stand   Upper Body Dressing : Set up;Sitting   Lower Body Dressing: Minimal assistance;Sitting/lateral leans;Sit to/from stand Lower Body Dressing Details (indicate cue type and reason): Pt able to don socks at recliner with setup and increased time/effort. Min as for standing LE dressing  due to unsteadiness. Toilet Transfer: Regular Toilet;Grab bars;Moderate assistance Toilet Transfer Details (indicate cue type and reason): Mod As to rise from low toilet with use of grab bar. Toileting- Clothing Manipulation and Hygiene: Minimal  assistance Toileting - Clothing Manipulation Details (indicate cue type and reason): Currently using external male catheter. Pt detached from hose to test bathroom mobility. Pt verbalized need to void and able to wait until suction re-attached to external catheter showig some bladder control.             Vision   Vision Assessment?: No apparent visual deficits Additional Comments: Pt reaching out for furniture and watching TV.     Perception     Praxis      Pertinent Vitals/Pain Pain Assessment: Faces Faces Pain Scale: Hurts little more Pain Location: back Pain Descriptors / Indicators: Sore Pain Intervention(s): Limited activity within patient's tolerance;Monitored during session     Hand Dominance Right   Extremity/Trunk Assessment Upper Extremity Assessment Upper Extremity Assessment: Generalized weakness (AROM: WFL)   Lower Extremity Assessment Lower Extremity Assessment: Generalized weakness       Communication     Cognition Arousal/Alertness: Awake/alert Behavior During Therapy: WFL for tasks assessed/performed Overall Cognitive Status: No family/caregiver present to determine baseline cognitive functioning                         Following Commands: Follows one step commands with increased time       General Comments: Pt is oriented to situations and following instructions well. Questions were limited due to pt's hearing loss. Pt following written directions well and observed taking precautions to avoid falls.   General Comments       Exercises     Shoulder Instructions      Home Living Family/patient expects to be discharged to:: Skilled nursing facility Living Arrangements: Alone (Wife currently in Nursing facility. Pt requesting [facility] for his Rehab, so possible this is where spouse is.)                                      Prior Functioning/Environment      ADL's / Homemaking Assistance Needed: Pt reporting, "I  can't go home. I can't function."  Interview limited due to [facility].            OT Problem List: Decreased strength;Pain;Decreased activity tolerance;Decreased safety awareness;Decreased knowledge of use of DME or AE;Impaired balance (sitting and/or standing);Decreased knowledge of precautions      OT Treatment/Interventions: Self-care/ADL training;Therapeutic exercise;Therapeutic activities;Cognitive remediation/compensation;Energy conservation;DME and/or AE instruction;Patient/family education;Balance training    OT Goals(Current goals can be found in the care plan section) Acute Rehab OT Goals Patient Stated Goal: To go to Rehab and get stronger. OT Goal Formulation: With patient Time For Goal Achievement: 08/13/20 Potential to Achieve Goals: Good ADL Goals Pt Will Perform Grooming: standing;with supervision Pt Will Perform Lower Body Bathing: with supervision;with adaptive equipment;sitting/lateral leans;sit to/from stand Pt Will Perform Lower Body Dressing: with supervision;sitting/lateral leans;sit to/from stand Pt Will Transfer to Toilet: with supervision;ambulating;regular height toilet Pt Will Perform Toileting - Clothing Manipulation and hygiene: sitting/lateral leans;with modified independence Pt/caregiver will Perform Home Exercise Program: Both right and left upper extremity;With theraband;Increased strength;With Supervision  OT Frequency: Min 2X/week   Barriers to D/C:    Lives alone. Pt reports current inability to care for himself.       Co-evaluation  AM-PAC OT "6 Clicks" Daily Activity     Outcome Measure Help from another person eating meals?: None Help from another person taking care of personal grooming?: A Little Help from another person toileting, which includes using toliet, bedpan, or urinal?: A Little Help from another person bathing (including washing, rinsing, drying)?: A Little Help from another person to put on and taking off regular  upper body clothing?: A Little Help from another person to put on and taking off regular lower body clothing?: A Little 6 Click Score: 19   End of Session Equipment Utilized During Treatment: Gait belt Nurse Communication: Other (comment) (Pt agreeable to SNF)  Activity Tolerance: Patient tolerated treatment well;Patient limited by pain Patient left: in chair;with call bell/phone within reach;with chair alarm set  OT Visit Diagnosis: Unsteadiness on feet (R26.81);Muscle weakness (generalized) (M62.81);Other symptoms and signs involving cognitive function;Pain Pain - part of body:  (back)                Time: 9432-7614 OT Time Calculation (min): 22 min Charges:  OT General Charges $OT Visit: 1 Visit OT Evaluation $OT Eval Low Complexity: 1 Low  Mattthew Ziomek, [place] Office: [phone] 07/30/2020  Julien Girt 07/30/2020, 8:51 AM

## 2020-07-30 NOTE — Progress Notes (Signed)
PROGRESS NOTE    TIMM BONENBERGER  TWS:[id number] DOB: [birth year] DOA: 07/28/2020 PCP: Burnard Bunting, MD   Brief Narrative:  Kevin Wiley  is a 80 y.o. male, with medical history of bilateral renal artery stenosis s/p stents in 1999, BPH, coronary artery disease with BMS to LAD, chronic low back pain, chronic disease, hyperlipidemia, hypertension, pacemaker placement, paroxysmal atrial fibrillation, came to hospital for low back pain and right leg weakness.  Patient states that weakness has been going on for long time, however he was unable to tell me the exact date or time when weakness started.  Patient has hearing loss and history is limited otherwise he is able to answer questions appropriately.  Patient was unable to get out of bed and after son called EMS. He denies chest pain or shortness of breath Denies nausea vomiting or diarrhea Denies dysuria or abdominal pain   As per ED notes, patient's son noticed that patient had been dragging his right leg.  Also A. fib with rapid father has developed some cognitive decline and has had more repetitive speech gets easily confused.   CT head was negative, MRI brain could not be obtained as patient has pacemaker in place  Assessment & Plan:   Active Problems:   Right leg weakness  Right leg weakness/chronic low back pain: Patient states that his low back pain is chronic but cannot tell me the exact time with certainty.  Poor historian.  No other focal deficit so I personally doubt any stroke, CT head negative and MRI has not indicated.  CT lumbar spine shows multilevel degenerative changes with mild to moderate canal stenosis at L3-L4 with left foraminal stenosis and nothing acute or severe.  Interestingly, his symptoms are on the right side.  Likely due to degenerative disc disease.  Seen by PT OT.  Further evaluation, patient and generalized symmetrical lower extremity weakness.  They recommend home health or SNF.  Discussed with patient and his  son, patient is living alone at this point in time as his wife is at a nursing home and he is not capable of taking care of himself and there is no one else to help so they prefer to go to SNF.  TOC consult placed.  Cognitive decline/decreased hearing: Seems to have some mild underlying cognitive deficit and he is already hard of hearing.  However he is able to communicate well.  Essential hypertension: Blood pressure fairly controlled, continue current scheduled hydralazine and atenolol.  History of CAD s/p stent: Asymptomatic, continue atenolol, aspirin and Lipitor.  SSS/second-degree AV block/PAT/RAS: Has dual-chamber permanent pacemaker.  Follows with cardiology as outpatient.  Constipation: MiraLAX, Colace and Dulcolax suppository  DVT prophylaxis: enoxaparin (LOVENOX) injection 40 mg Start: 07/28/20 2200   Code Status: Full Code  Family Communication: None present at bedside.  Plan of care discussed with patient in length and he verbalized understanding and agreed with it.  Discussed in length with the son who informed me that patient's wife who he used to live with has been in a nursing home since about 2 months and there is no one to take care of him.  We will need to go to SNF and patient also agrees with that.  .Status is: Inpatient  Remains inpatient appropriate because:Unsafe d/c plan  Dispo: The patient is from: Home              Anticipated d/c is to: SNF  Patient currently is medically stable to d/c.   Difficult to place patient No        Estimated body mass index is 22.5 kg/m as calculated from the following:   Height as of this encounter: 5' 9"  (1.753 m).   Weight as of this encounter: 69.1 kg.      Nutritional status:               Consultants:  None  Procedures:  None  Antimicrobials:  Anti-infectives (From admission, onward)    None          Subjective: Seen and examined.  Still complains of right lower extremity  weakness.  Also requesting something to help with the constipation.  Objective: Vitals:   07/29/20 2111 07/30/20 0515 07/30/20 0950 07/30/20 1339  BP: 134/85 (!) 170/78 120/79 (!) 144/101  Pulse: (!) 59 65 61 63  Resp: 16 18  17   Temp: 98.3 F (36.8 C) 98.6 F (37 C)    TempSrc: Oral Oral    SpO2: 97% 95%  99%  Weight:      Height:        Intake/Output Summary (Last 24 hours) at 07/30/2020 1341 Last data filed at 07/30/2020 0012 Gross per 24 hour  Intake 240 ml  Output 600 ml  Net -360 ml    Filed Weights   07/28/20 1753  Weight: 69.1 kg    Examination:  General exam: Appears calm and comfortable  Respiratory system: Clear to auscultation. Respiratory effort normal. Cardiovascular system: S1 & S2 heard, RRR. No JVD, murmurs, rubs, gallops or clicks. No pedal edema. Gastrointestinal system: Abdomen is nondistended, soft and nontender. No organomegaly or masses felt. Normal bowel sounds heard. Central nervous system: Alert and oriented.  Slightly decreased strength in right lower extremity. Extremities: Symmetric 5 x 5 power. Skin: No rashes, lesions or ulcers.  Psychiatry: Judgement and insight appear poor.   Data Reviewed: I have personally reviewed following labs and imaging studies  CBC: Recent Labs  Lab 07/28/20 1300 07/29/20 0554  WBC 7.8 6.2  NEUTROABS 5.6  --   HGB 13.6 13.2  HCT 40.2 40.2  MCV 93.3 94.1  PLT 166 938    Basic Metabolic Panel: Recent Labs  Lab 07/28/20 1300 07/29/20 0554  NA 140 140  K 4.1 4.1  CL 103 104  CO2 29 30  GLUCOSE 106* 101*  BUN 26* 15  CREATININE 1.10 1.04  CALCIUM 9.2 9.1    GFR: Estimated Creatinine Clearance: 57.2 mL/min (by C-G formula based on SCr of 1.04 mg/dL). Liver Function Tests: Recent Labs  Lab 07/28/20 1300 07/29/20 0554  AST 22 19  ALT 15 13  ALKPHOS 53 51  BILITOT 0.5 0.6  PROT 6.7 6.5  ALBUMIN 4.2 3.8    No results for input(s): LIPASE, AMYLASE in the last 168 hours. No results for  input(s): AMMONIA in the last 168 hours. Coagulation Profile: No results for input(s): INR, PROTIME in the last 168 hours. Cardiac Enzymes: No results for input(s): CKTOTAL, CKMB, CKMBINDEX, TROPONINI in the last 168 hours. BNP (last 3 results) No results for input(s): PROBNP in the last 8760 hours. HbA1C: No results for input(s): HGBA1C in the last 72 hours. CBG: No results for input(s): GLUCAP in the last 168 hours. Lipid Profile: No results for input(s): CHOL, HDL, LDLCALC, TRIG, CHOLHDL, LDLDIRECT in the last 72 hours. Thyroid Function Tests: Recent Labs    07/28/20 1903  TSH 1.131    Anemia Panel: Recent Labs  07/28/20 1903  VITAMINB12 195    Sepsis Labs: No results for input(s): PROCALCITON, LATICACIDVEN in the last 168 hours.  Recent Results (from the past 240 hour(s))  Resp Panel by RT-PCR (Flu A&B, Covid) Nasopharyngeal Swab     Status: None   Collection Time: 07/28/20  1:28 PM   Specimen: Nasopharyngeal Swab; Nasopharyngeal(NP) swabs in vial transport medium  Result Value Ref Range Status   SARS Coronavirus 2 by RT PCR NEGATIVE NEGATIVE Final    Comment: (NOTE) SARS-CoV-2 target nucleic acids are NOT DETECTED.  The SARS-CoV-2 RNA is generally detectable in upper respiratory specimens during the acute phase of infection. The lowest concentration of SARS-CoV-2 viral copies this assay can detect is 138 copies/mL. A negative result does not preclude SARS-Cov-2 infection and should not be used as the sole basis for treatment or other patient management decisions. A negative result may occur with  improper specimen collection/handling, submission of specimen other than nasopharyngeal swab, presence of viral mutation(s) within the areas targeted by this assay, and inadequate number of viral copies(<138 copies/mL). A negative result must be combined with clinical observations, patient history, and epidemiological information. The expected result is  Negative.  Fact Sheet for Patients:  EntrepreneurPulse.com.au  Fact Sheet for Healthcare Providers:  IncredibleEmployment.be  This test is no t yet approved or cleared by the [place] FDA and  has been authorized for detection and/or diagnosis of SARS-CoV-2 by FDA under an Emergency Use Authorization (EUA). This EUA will remain  in effect (meaning this test can be used) for the duration of the COVID-19 declaration under Section 564(b)(1) of the Act, 21 U.S.C.section 360bbb-3(b)(1), unless the authorization is terminated  or revoked sooner.       Influenza A by PCR NEGATIVE NEGATIVE Final   Influenza B by PCR NEGATIVE NEGATIVE Final    Comment: (NOTE) The Xpert Xpress SARS-CoV-2/FLU/RSV plus assay is intended as an aid in the diagnosis of influenza from Nasopharyngeal swab specimens and should not be used as a sole basis for treatment. Nasal washings and aspirates are unacceptable for Xpert Xpress SARS-CoV-2/FLU/RSV testing.  Fact Sheet for Patients: EntrepreneurPulse.com.au  Fact Sheet for Healthcare Providers: IncredibleEmployment.be  This test is not yet approved or cleared by the [place] FDA and has been authorized for detection and/or diagnosis of SARS-CoV-2 by FDA under an Emergency Use Authorization (EUA). This EUA will remain in effect (meaning this test can be used) for the duration of the COVID-19 declaration under Section 564(b)(1) of the Act, 21 U.S.C. section 360bbb-3(b)(1), unless the authorization is terminated or revoked.  Performed at [facility], [place] [address], [place], [place] [address]        Radiology Studies: CT Head Wo Contrast  Result Date: 07/28/2020 CLINICAL DATA:  Fatigue x2 days. EXAM: CT HEAD WITHOUT CONTRAST TECHNIQUE: Contiguous axial images were obtained from the base of the skull through the vertex without intravenous contrast.  COMPARISON:  February 16, 2020 FINDINGS: Brain: There is mild cerebral atrophy with widening of the extra-axial spaces and ventricular dilatation. There are areas of decreased attenuation within the white matter tracts of the supratentorial brain, consistent with microvascular disease changes. Vascular: No hyperdense vessel or unexpected calcification. Skull: Normal. Negative for fracture or focal lesion. Sinuses/Orbits: No acute finding. Other: None. IMPRESSION: 1. Generalized cerebral atrophy. 2. No acute intracranial abnormality. Electronically Signed   By: Virgina Norfolk M.D.   On: 07/28/2020 15:33   CT LUMBAR SPINE WO CONTRAST  Result Date: 07/28/2020 CLINICAL DATA:  Back pain EXAM: CT LUMBAR SPINE WITHOUT CONTRAST TECHNIQUE: Multidetector CT imaging of the lumbar spine was performed without intravenous contrast administration. Multiplanar CT image reconstructions were also generated. COMPARISON:  CT lumbar spine 02/15/2017. FINDINGS: Segmentation: 5 non rib-bearing lumbar vertebral bodies. Alignment: No substantial sagittal subluxation. Mild dextrocurvature in the upper lumbar spine with slight compensatory levocurvature in the lower lumbar spine. Vertebrae: Similar vertebral body heights. No evidence of acute fracture. Degenerative Schmorl's node involving the superior L5 endplate. Paraspinal and other soft tissues: In comparison to recent CTA from May 05, 2020, similar size of an abdominal aortic aneurysm. Disc levels: T12-L1: Mild facet hypertrophy and disc bulging without significant stenosis. L1-L2: Mild facet hypertrophy and disc bulging without significant stenosis. L2-L3: Left greater than right facet hypertrophy. Left eccentric disc bulge with similar mild left foraminal and left subarticular recess stenosis. L3-L4: Broad disc bulge with moderate bilateral facet hypertrophy and ligamentum flavum thickening. Left foraminal disc protrusion which likely contacts the exiting left L3 nerve with at  least moderate left foraminal stenosis which is likely progressed. Bilateral subarticular recess stenosis with at least mild-to-moderate canal stenosis. L4-L5: Broad disc bulge with ligamentum flavum thickening and moderate to severe facet hypertrophy. Bilateral subarticular recess and and likely moderate foraminal stenosis. At least mild canal stenosis. L5-S1: Broad disc bulge and moderate facet hypertrophy, worse on the right. Likely mild subarticular recess stenosis without significant canal stenosis. IMPRESSION: 1. Multilevel degenerative change without evidence of acute bony abnormality. 2. At L3-L4, there is at least mild to moderate canal stenosis with bilateral subarticular recess stenosis and at least moderate left foraminal stenosis. 3. At L4-L5 there is at least mild canal stenosis with bilateral subarticular recess stenosis and likely moderate bilateral foraminal stenosis. 4. At L3-L4, there is likely mild left foraminal and subarticular recess stenosis. 5. If the patient is able, an MRI could better characterize the canal and foramina. 6. In comparison to recent CTA from May 05, 2020, similar size of an abdominal aortic aneurysm. Please see that study for characterization and follow-up recommendations. Electronically Signed   By: Margaretha Sheffield MD   On: 07/28/2020 18:01    Scheduled Meds:  aspirin EC  81 mg Oral Daily   atenolol  100 mg Oral Daily   atorvastatin  80 mg Oral QHS   enoxaparin (LOVENOX) injection  40 mg Subcutaneous Q24H   gabapentin  800 mg Oral TID   hydrALAZINE  50 mg Oral TID   Continuous Infusions:  sodium chloride       LOS: 0 days   Time spent: 30 minutes   Darliss Cheney, MD [facility]  07/30/2020, 1:41 PM   How to contact the [facility] Attending or Consulting provider [place] or covering provider during after hours [place], for this patient?  Check the care team in [facility] and look for a) attending/consulting TRH provider listed and b) the [facility] team listed.  Page or secure chat [phone]. Log into [email or web address] and use [place]'s universal password to access. If you do not have the password, please contact the hospital operator. Locate the [facility] provider you are looking for under [facility] and page to a number that you can be directly reached. If you still have difficulty reaching the provider, please page the [facility] (Director on Call) for the Hospitalists listed on amion for assistance.

## 2020-07-30 NOTE — TOC Initial Note (Signed)
Transition of Care [facility]) - Initial/Assessment Note    Patient Details  Name: Kevin Wiley MRN: [id number] Date of Birth: [birth year]  [facility] CM/SW Contact:    Lennart Pall, LCSW Phone Number: 07/30/2020, 1:38 PM  Clinical Narrative:                 Met with pt today to introduce self/ TOC role and discuss dc plans.  Pt lying partially in bed and is very HOH.  Appears slightly anxious/ apprehensive about what I am trying to discuss with him.  He has difficulty with my mask and understanding but we were able to manage discussion about recommendations for SNF/ rehab.  Pt is agreeable to SNF plan and asks that I try to get a bed at [facility] as he has been there in the past. Pt confirms that his wife is currently in a SNF, however, does not know which one.  He notes his son is the only other family member and is agreeable for me to contact him.  Attempted to reach son, Shanon Brow, however no VM set up to leave message - will continue to try.  Will begin SNF bed search.  Expected Discharge Plan: Skilled Nursing Facility Barriers to Discharge: Continued Medical Work up   Patient Goals and CMS Choice Patient states their goals for this hospitalization and ongoing recovery are:: "I don't know"      Expected Discharge Plan and Services Expected Discharge Plan: [place] In-house Referral: Clinical Social Work   Post Acute Care Choice: [place] Living arrangements for the past 2 months: [place]                                      Prior Living Arrangements/Services Living arrangements for the past 2 months: Single Family Home Lives with:: Self Patient language and need for interpreter reviewed:: Yes Do you feel safe going back to the place where you live?: No   Pt living alone and does not feel that he can manage any longer on his own (wife in SNF)  Need for Family Participation in Patient Care: Yes (Comment) Care giver  support system in place?: No (comment)   Criminal Activity/Legal Involvement Pertinent to Current Situation/Hospitalization: No - Comment as needed  Activities of Daily Living Home Assistive Devices/Equipment: Other (Comment) (unknown, pt unable to answer, refuses to allow family to be called) ADL Screening (condition at time of admission) Patient's cognitive ability adequate to safely complete daily activities?: No Is the patient deaf or have difficulty hearing?: Yes Does the patient have difficulty seeing, even when wearing glasses/contacts?: Yes Does the patient have difficulty concentrating, remembering, or making decisions?: Yes Patient able to express need for assistance with ADLs?: No (Pt keeps repeating over and over "I don't know what you are saying") Does the patient have difficulty dressing or bathing?: Yes Independently performs ADLs?: No Communication: Independent Dressing (OT): Needs assistance Is this a change from baseline?: Pre-admission baseline Grooming: Needs assistance Is this a change from baseline?: Pre-admission baseline Feeding: Needs assistance Is this a change from baseline?: Pre-admission baseline Bathing: Needs assistance Is this a change from baseline?: Pre-admission baseline Toileting: Needs assistance Is this a change from baseline?: Pre-admission baseline In/Out Bed: Needs assistance Is this a change from baseline?: Pre-admission baseline Walks in Home: Needs assistance Is this a change from baseline?: Pre-admission baseline Does the patient  have difficulty walking or climbing stairs?: Yes Weakness of Legs: Both Weakness of Arms/Hands: Both  Permission Sought/Granted Permission sought to share information with : Facility Sport and exercise psychologist, Family Supports Permission granted to share information with : Yes, Verbal Permission Granted  Share Information with NAME: Renardo Cheatum     Permission granted to share info w Relationship: son  Permission  granted to share info w Contact Information: [phone]  Emotional Assessment Appearance:: Appears stated age Attitude/Demeanor/Rapport: Apprehensive Affect (typically observed): Apprehensive Orientation: : Oriented to Self, Oriented to Place, Oriented to Situation Alcohol / Substance Use: Not Applicable Psych Involvement: No (comment)  Admission diagnosis:  Right leg weakness [R29.898] Weakness of right lower extremity [R29.898] Confusion and disorientation [R41.0] Patient Active Problem List   Diagnosis Date Noted   Right leg weakness 07/28/2020   Acute encephalopathy 03/03/2018   BPH (benign prostatic hyperplasia) 03/03/2018   Hypertensive urgency 02/24/2018   GERD (gastroesophageal reflux disease) 03/17/2017   Hyponatremia 02/23/2017   Normocytic anemia 02/23/2017   B12 deficiency 02/23/2017   Folate deficiency 02/23/2017   Intractable pain 02/15/2017   Dehydration    Anxiety and depression 08/28/2016   Chest pain 07/04/2015   Chest pain with high risk for cardiac etiology 07/04/2015   Paroxysmal atrial tachycardia ([place]) 05/05/2015   Low back pain 08/16/2013   Neck pain 08/16/2013   Polyneuropathy 06/14/2013   Abnormality of gait 06/14/2013   SSS (sick sinus syndrome) ([place]) 06/10/2013   Second degree AV block 06/10/2013   Elective replacement indicated for pacemaker 06/10/2013   Ataxia 05/03/2013   Altered mental status 05/03/2013   Orthostatic hypotension 12/04/2012   HTN (hypertension) 12/04/2012   Hyperlipidemia, mixed 12/04/2012   PTSD (post-traumatic stress disorder) 12/04/2012   CAD (coronary artery disease) 12/04/2012   Pacemaker dual chamber [employer]onic 2007, new generator 06/11/13 12/04/2012   Crohn disease ([place]) 12/04/2012   Bilateral renal artery stenosis ([place]) 12/04/2012   Fatigue 12/04/2012   PCP:  Burnard Bunting, MD Pharmacy:   [id number] [place], [address] AT [facility] [address] [address] Phone: [phone] Fax: [place], [place] - [address] [place] [address] Phone: [phone] Fax: [phone]  [place], [place] - [place] [address] [place] [address] Phone: [phone] Fax: [phone]     Social Determinants of Health (SDOH) Interventions    Readmission Risk Interventions No flowsheet data found.

## 2020-07-30 NOTE — NC FL2 (Signed)
[place] LEVEL OF CARE SCREENING TOOL     IDENTIFICATION  Patient Name: Kevin Wiley Birthdate: 1941/01/07 Sex: male Admission Date (Current Location): 07/28/2020  [facility] and [place] Number:  Herbalist and Address:  [facility],  [place], [place]      Provider Number: [id number]  Attending Physician Name and Address:  Darliss Cheney, MD  Relative Name and Phone Number:  son, Asa Baudoin @ [phone]    Current Level of Care: Hospital Recommended Level of Care: [place] Prior Approval Number:    Date Approved/Denied:   PASRR Number:    Discharge Plan: SNF    Current Diagnoses: Patient Active Problem List   Diagnosis Date Noted   Right leg weakness 07/28/2020   Acute encephalopathy 03/03/2018   BPH (benign prostatic hyperplasia) 03/03/2018   Hypertensive urgency 02/24/2018   GERD (gastroesophageal reflux disease) 03/17/2017   Hyponatremia 02/23/2017   Normocytic anemia 02/23/2017   B12 deficiency 02/23/2017   Folate deficiency 02/23/2017   Intractable pain 02/15/2017   Dehydration    Anxiety and depression 08/28/2016   Chest pain 07/04/2015   Chest pain with high risk for cardiac etiology 07/04/2015   Paroxysmal atrial tachycardia ([place]) 05/05/2015   Low back pain 08/16/2013   Neck pain 08/16/2013   Polyneuropathy 06/14/2013   Abnormality of gait 06/14/2013   SSS (sick sinus syndrome) ([place]) 06/10/2013   Second degree AV block 06/10/2013   Elective replacement indicated for pacemaker 06/10/2013   Ataxia 05/03/2013   Altered mental status 05/03/2013   Orthostatic hypotension 12/04/2012   HTN (hypertension) 12/04/2012   Hyperlipidemia, mixed 12/04/2012   PTSD (post-traumatic stress disorder) 12/04/2012   CAD (coronary artery disease) 12/04/2012   Pacemaker dual chamber Med[employer]onic 2007, new generator 06/11/13 12/04/2012   Crohn disease ([place]) 12/04/2012   Bilateral renal artery stenosis  (HCC) 12/04/2012   Fatigue 12/04/2012    Orientation RESPIRATION BLADDER Height & Weight     Self, Situation, Place  Normal Continent, External catheter Weight: 152 lb 5.4 oz (69.1 kg) Height:  5' 9"  (175.3 cm)  BEHAVIORAL SYMPTOMS/MOOD NEUROLOGICAL BOWEL NUTRITION STATUS      Continent    AMBULATORY STATUS COMMUNICATION OF NEEDS Skin   Limited Assist Verbally Normal                       Personal Care Assistance Level of Assistance  Bathing, Dressing Bathing Assistance: Limited assistance   Dressing Assistance: Limited assistance     Functional Limitations Info  Hearing   Hearing Info: Impaired      SPECIAL CARE FACTORS FREQUENCY  PT (By licensed PT), OT (By licensed OT)     PT Frequency: 5x/wk OT Frequency: 5x/wk            Contractures Contractures Info: Not present    Additional Factors Info  Code Status, Allergies, Psychotropic Code Status Info: Full Allergies Info: see MAR Psychotropic Info: see MAR         Current Medications (07/30/2020):  This is the current hospital active medication list Current Facility-Administered Medications  Medication Dose Route Frequency Provider Last Rate Last Admin   0.9 %  sodium chloride infusion   Intravenous Continuous Darrick Meigs, Marge Duncans, MD       acetaminophen (TYLENOL) tablet 650 mg  650 mg Oral Q6H PRN [name], MD       Or   acetaminophen (TYLENOL) suppository 650 mg  650 mg  Rectal Q6H PRN [name], MD       aspirin EC tablet 81 mg  81 mg Oral Daily [name], MD   81 mg at 07/30/20 0950   atenolol (TENORMIN) tablet 100 mg  100 mg Oral Daily [name], MD   100 mg at 07/30/20 0950   atorvastatin (LIPITOR) tablet 80 mg  80 mg Oral QHS [name], MD   80 mg at 07/29/20 2128   enoxaparin (LOVENOX) injection 40 mg  40 mg Subcutaneous Q24H [name], MD   40 mg at 07/29/20 2128   gabapentin (NEURONTIN) capsule 800 mg  800 mg Oral TID [name], MD   800 mg at 07/30/20 0950   hydrALAZINE  (APRESOLINE) tablet 50 mg  50 mg Oral TID [name], MD   50 mg at 07/30/20 0950   HYDROcodone-acetaminophen (NORCO/VICODIN) 5-325 MG per tablet 1 tablet  1 tablet Oral Q4H PRN [name], MD       ondansetron [facility]) tablet 4 mg  4 mg Oral Q6H PRN [name], MD       Or   ondansetron (ZOFRAN) injection 4 mg  4 mg Intravenous Q6H PRN [name], MD         Discharge Medications: Please see discharge summary for a list of discharge medications.  Relevant Imaging Results:  Relevant Lab Results:   Additional Information SSN: [id number]  Lennart Pall, LCSW

## 2020-07-31 DIAGNOSIS — M5442 Lumbago with sciatica, left side: Secondary | ICD-10-CM

## 2020-07-31 DIAGNOSIS — G8929 Other chronic pain: Secondary | ICD-10-CM

## 2020-07-31 LAB — RPR: RPR Ser Ql: NONREACTIVE

## 2020-07-31 MED ORDER — ENSURE ENLIVE PO LIQD
237.0000 mL | Freq: Two times a day (BID) | ORAL | Status: DC
  Administered 2020-07-31 – 2020-08-01 (×2): 237 mL via ORAL

## 2020-07-31 MED ORDER — ZOLPIDEM TARTRATE 5 MG PO TABS
5.0000 mg | ORAL_TABLET | Freq: Once | ORAL | Status: AC
  Administered 2020-07-31: 5 mg via ORAL
  Filled 2020-07-31: qty 1

## 2020-07-31 MED ORDER — TRAZODONE HCL 50 MG PO TABS
100.0000 mg | ORAL_TABLET | Freq: Once | ORAL | Status: AC
  Administered 2020-07-31: 100 mg via ORAL
  Filled 2020-07-31: qty 2

## 2020-07-31 NOTE — Progress Notes (Signed)
Initial Nutrition Assessment  INTERVENTION:   -Ensure Enlive po BID, each supplement provides 350 kcal and 20 grams of protein  NUTRITION DIAGNOSIS:   Inadequate oral intake related to lethargy/confusion as evidenced by per patient/family report.  GOAL:   Patient will meet greater than or equal to 90% of their needs  MONITOR:   PO intake, Supplement acceptance, Labs, Weight trends, I & O's  REASON FOR ASSESSMENT:   Consult Assessment of nutrition requirement/status, Poor PO  ASSESSMENT:   80 y.o. male, with medical history of bilateral renal artery stenosis s/p stents in 1999, BPH, coronary artery disease with BMS to LAD, chronic low back pain, chronic disease, hyperlipidemia, hypertension, pacemaker placement, paroxysmal atrial fibrillation, came to hospital for low back pain and right leg weakness.  Patient is HOH and poor historian. Has reported history of constipation but last BM recorded today. Per son's report, pt has had increased confusion over the past week.  Pt currently consuming 100% of meals. Will order Ensure supplements given weight loss.  Per weight records, pt has lost 8 lbs since 3/14 (5% wt loss x 3.5 months, insignificant for time frame).   Medications: Colace, Miralax  Labs reviewed.  NUTRITION - FOCUSED PHYSICAL EXAM:  Deferred.  Diet Order:   Diet Order             Diet Heart Room service appropriate? Yes; Fluid consistency: Thin  Diet effective now                   EDUCATION NEEDS:   No education needs have been identified at this time  Skin:  Skin Assessment: Reviewed RN Assessment  Last BM:  6/27 -type 4  Height:   Ht Readings from Last 1 Encounters:  07/28/20 5' 9"  (1.753 m)    Weight:   Wt Readings from Last 1 Encounters:  07/28/20 69.1 kg    BMI:  Body mass index is 22.5 kg/m.  Estimated Nutritional Needs:   Kcal:  8177-1165  Protein:  80-90g  Fluid:  1.8L/day  Clayton Bibles, MS, RD, LDN Inpatient  Clinical Dietitian Contact information available via Amion

## 2020-07-31 NOTE — TOC Progression Note (Addendum)
Transition of Care [facility]) - Progression Note    Patient Details  Name: Kevin Wiley MRN: [id number] Date of Birth: [birth year]  [facility] CM/SW Contact  Kecia Swoboda, Marjie Skiff, RN Phone Number: 07/31/2020, 2:53 PM  Clinical Narrative:    SNF bed offers provided to son per pt request. Son to make decision and call this CM with decision. Will request new covid test for dc.  Pasrr received: [phone] [id number]  Expected Discharge Plan: [place] Barriers to Discharge: Continued Medical Work up  Expected Discharge Plan and Services Expected Discharge Plan: [place] In-house Referral: Clinical Social Work   Post Acute Care Choice: [place] Living arrangements for the past 2 months: Single Family Home                  Readmission Risk Interventions No flowsheet data found.

## 2020-07-31 NOTE — Progress Notes (Signed)
PROGRESS NOTE    Kevin Wiley  NTI:[id number] DOB: 02/08/1940 DOA: 07/28/2020 PCP: Burnard Bunting, MD   Brief Narrative:  Kevin Wiley  is a 80 y.o. male, with medical history of bilateral renal artery stenosis s/p stents in 1999, BPH, coronary artery disease with BMS to LAD, chronic low back pain, chronic disease, hyperlipidemia, hypertension, pacemaker placement, paroxysmal atrial fibrillation, came to hospital for low back pain and right leg weakness.  Patient states that weakness has been going on for long time, however he was unable to tell me the exact date or time when weakness started.  Patient has hearing loss and history is limited otherwise he is Kevin to answer questions appropriately.  Patient was unable to get out of bed and after son called EMS. He denies chest pain or shortness of breath Denies nausea vomiting or diarrhea Denies dysuria or abdominal pain   As per ED notes, patient's son noticed that patient had been dragging his right leg.  Also A. fib with rapid father has developed some cognitive decline and has had more repetitive speech gets easily confused.   CT head was negative, MRI brain could not be obtained as patient has pacemaker in place  Assessment & Plan:   Active Problems:   HTN (hypertension)   Hyperlipidemia, mixed   CAD (coronary artery disease)   SSS (sick sinus syndrome) (HCC)   Low back pain   Paroxysmal atrial tachycardia (HCC)   Chest pain with high risk for cardiac etiology   Anxiety and depression   BPH (benign prostatic hyperplasia)   Right leg weakness  Right leg weakness/chronic low back pain: Patient states that his low back pain is chronic but cannot tell me the exact time with certainty.  Poor historian.  No other focal deficit so I personally doubt any stroke, CT head negative and MRI neither indicated not can be done due to pacemaker.  CT lumbar spine shows multilevel degenerative changes with mild to moderate canal stenosis at L3-L4  with left foraminal stenosis and nothing acute or severe.  Interestingly, his symptoms are on the right side.  Likely due to degenerative disc disease.  Seen by PT OT.  Per them, patient had generalized symmetrical lower extremity weakness.  They recommend home health or SNF.  Discussed with patient and his son, patient is living alone at this point in time as his wife is at a nursing home and he is not capable of taking care of himself and there is no one else to help so they prefer to go to SNF.  He and his son prefers SNF.  TOC consult placed.  Awaiting for placement.  Medically stable for discharge anytime.  Cognitive decline/decreased hearing: Seems to have some mild underlying cognitive deficit and he is already hard of hearing.  However he is Kevin to communicate well.  Recommend follow-up with neurology for formal dementia work-up.  Essential hypertension: Blood pressure fairly controlled, continue current scheduled hydralazine and atenolol.  History of CAD s/p stent: Asymptomatic, continue atenolol, aspirin and Lipitor.  SSS/second-degree AV block/PAT/RAS: Has dual-chamber permanent pacemaker.  Follows with cardiology as outpatient.  Constipation: MiraLAX, Colace and Dulcolax suppository  DVT prophylaxis: enoxaparin (LOVENOX) injection 40 mg Start: 07/28/20 2200   Code Status: Full Code  Family Communication: None present at bedside.  Discussed in length with the son other day  .Status is: Inpatient  Remains inpatient appropriate because:Unsafe d/c plan  Dispo: The patient is from: Home  Anticipated d/c is to: SNF              Patient currently is medically stable to d/c.   Difficult to place patient No        Estimated body mass index is 22.5 kg/m as calculated from the following:   Height as of this encounter: 5' 9"  (1.753 m).   Weight as of this encounter: 69.1 kg.      Nutritional status:  Nutrition Problem: Inadequate oral intake Etiology:  lethargy/confusion   Signs/Symptoms: per patient/family report   Interventions: Ensure Enlive (each supplement provides 350kcal and 20 grams of protein)    Consultants:  None  Procedures:  None  Antimicrobials:  Anti-infectives (From admission, onward)    None          Subjective: Seen and examined.  No new complaint.  Does not feel any improvement in right lower extremity weakness.  Also states that he has been " impacted" for last 4 days and request medications.  After talking to the nurses, they tell me the patient had a large BM just 2 days ago and 2 small BMs today as well.  He is already on medications for constipation.  Objective: Vitals:   07/30/20 1339 07/30/20 2144 07/30/20 2205 07/31/20 0545  BP: (!) 144/101 (!) 168/90  (!) 160/81  Pulse: 63 60  61  Resp: 17 20  16   Temp:   98.7 F (37.1 C) 97.7 F (36.5 C)  TempSrc:   Oral Oral  SpO2: 99% 98%  98%  Weight:      Height:        Intake/Output Summary (Last 24 hours) at 07/31/2020 1303 Last data filed at 07/31/2020 0905 Gross per 24 hour  Intake 240 ml  Output 1350 ml  Net -1110 ml   Filed Weights   07/28/20 1753  Weight: 69.1 kg    Examination:  General exam: Appears calm and comfortable  Respiratory system: Clear to auscultation. Respiratory effort normal. Cardiovascular system: S1 & S2 heard, RRR. No JVD, murmurs, rubs, gallops or clicks. No pedal edema. Gastrointestinal system: Abdomen is nondistended, soft and nontender. No organomegaly or masses felt. Normal bowel sounds heard. Central nervous system: Alert and oriented x3. No focal neurological deficits.  Decreased strength in right lower extremity. Extremities: Symmetric 5 x 5 power. Skin: No rashes, lesions or ulcers.  Psychiatry: Judgement and insight appear poor, mood and affect flat.  Data Reviewed: I have personally reviewed following labs and imaging studies  CBC: Recent Labs  Lab 07/28/20 1300 07/29/20 0554  WBC 7.8 6.2   NEUTROABS 5.6  --   HGB 13.6 13.2  HCT 40.2 40.2  MCV 93.3 94.1  PLT 166 786   Basic Metabolic Panel: Recent Labs  Lab 07/28/20 1300 07/29/20 0554  NA 140 140  K 4.1 4.1  CL 103 104  CO2 29 30  GLUCOSE 106* 101*  BUN 26* 15  CREATININE 1.10 1.04  CALCIUM 9.2 9.1   GFR: Estimated Creatinine Clearance: 57.2 mL/min (by C-G formula based on SCr of 1.04 mg/dL). Liver Function Tests: Recent Labs  Lab 07/28/20 1300 07/29/20 0554  AST 22 19  ALT 15 13  ALKPHOS 53 51  BILITOT 0.5 0.6  PROT 6.7 6.5  ALBUMIN 4.2 3.8   No results for input(s): LIPASE, AMYLASE in the last 168 hours. No results for input(s): AMMONIA in the last 168 hours. Coagulation Profile: No results for input(s): INR, PROTIME in the last 168 hours. Cardiac Enzymes:  No results for input(s): CKTOTAL, CKMB, CKMBINDEX, TROPONINI in the last 168 hours. BNP (last 3 results) No results for input(s): PROBNP in the last 8760 hours. HbA1C: No results for input(s): HGBA1C in the last 72 hours. CBG: No results for input(s): GLUCAP in the last 168 hours. Lipid Profile: No results for input(s): CHOL, HDL, LDLCALC, TRIG, CHOLHDL, LDLDIRECT in the last 72 hours. Thyroid Function Tests: Recent Labs    07/28/20 1903  TSH 1.131   Anemia Panel: Recent Labs    07/28/20 1903  VITAMINB12 195   Sepsis Labs: No results for input(s): PROCALCITON, LATICACIDVEN in the last 168 hours.  Recent Results (from the past 240 hour(s))  Resp Panel by RT-PCR (Flu A&B, Covid) Nasopharyngeal Swab     Status: None   Collection Time: 07/28/20  1:28 PM   Specimen: Nasopharyngeal Swab; Nasopharyngeal(NP) swabs in vial transport medium  Result Value Ref Range Status   SARS Coronavirus 2 by RT PCR NEGATIVE NEGATIVE Final    Comment: (NOTE) SARS-CoV-2 target nucleic acids are NOT DETECTED.  The SARS-CoV-2 RNA is generally detectable in upper respiratory specimens during the acute phase of infection. The lowest concentration of  SARS-CoV-2 viral copies this assay can detect is 138 copies/mL. A negative result does not preclude SARS-Cov-2 infection and should not be used as the sole basis for treatment or other patient management decisions. A negative result may occur with  improper specimen collection/handling, submission of specimen other than nasopharyngeal swab, presence of viral mutation(s) within the areas targeted by this assay, and inadequate number of viral copies(<138 copies/mL). A negative result must be combined with clinical observations, patient history, and epidemiological information. The expected result is Negative.  Fact Sheet for Patients:  EntrepreneurPulse.com.au  Fact Sheet for Healthcare Providers:  IncredibleEmployment.be  This test is no t yet approved or cleared by the [place] FDA and  has been authorized for detection and/or diagnosis of SARS-CoV-2 by FDA under an Emergency Use Authorization (EUA). This EUA will remain  in effect (meaning this test can be used) for the duration of the COVID-19 declaration under Section 564(b)(1) of the Act, 21 U.S.C.section 360bbb-3(b)(1), unless the authorization is terminated  or revoked sooner.       Influenza A by PCR NEGATIVE NEGATIVE Final   Influenza B by PCR NEGATIVE NEGATIVE Final    Comment: (NOTE) The Xpert Xpress SARS-CoV-2/FLU/RSV plus assay is intended as an aid in the diagnosis of influenza from Nasopharyngeal swab specimens and should not be used as a sole basis for treatment. Nasal washings and aspirates are unacceptable for Xpert Xpress SARS-CoV-2/FLU/RSV testing.  Fact Sheet for Patients: EntrepreneurPulse.com.au  Fact Sheet for Healthcare Providers: IncredibleEmployment.be  This test is not yet approved or cleared by the [place] FDA and has been authorized for detection and/or diagnosis of SARS-CoV-2 by FDA under an Emergency Use  Authorization (EUA). This EUA will remain in effect (meaning this test can be used) for the duration of the COVID-19 declaration under Section 564(b)(1) of the Act, 21 U.S.C. section 360bbb-3(b)(1), unless the authorization is terminated or revoked.  Performed at [facility], [place] [address] [place], [place] [address]        Radiology Studies: No results found.  Scheduled Meds:  aspirin EC  81 mg Oral Daily   atenolol  100 mg Oral Daily   atorvastatin  80 mg Oral QHS   docusate sodium  100 mg Oral BID   enoxaparin (LOVENOX) injection  40 mg Subcutaneous Q24H  feeding supplement  237 mL Oral BID BM   gabapentin  800 mg Oral TID   hydrALAZINE  50 mg Oral TID   polyethylene glycol  17 g Oral Daily   Continuous Infusions:  sodium chloride       LOS: 1 day   Time spent: 26 minutes   Darliss Cheney, MD [facility]  07/31/2020, 1:03 PM   How to contact the [facility] Attending or Consulting provider [place] or covering provider during after hours [place], for this patient?  Check the care team in [facility] and look for a) attending/consulting TRH provider listed and b) the [facility] team listed. Page or secure chat [phone]. Log into [email or web address] and use [place]'s universal password to access. If you do not have the password, please contact the hospital operator. Locate the [facility] provider you are looking for under [facility] and page to a number that you can be directly reached. If you still have difficulty reaching the provider, please page the [facility] (Director on Call) for the Hospitalists listed on amion for assistance.

## 2020-07-31 NOTE — Progress Notes (Signed)
Physical Therapy Treatment Patient Details Name: Kevin Wiley MRN: [id number] DOB: March 08, 1940 Today's Date: 07/31/2020    History of Present Illness 80 y.o. male admitted for RLE weakness and back pain. PMH: bilateral renal artery stenosis s/p stents in 1999, BPH, coronary artery disease with BMS to LAD, chronic low back pain, chronic disease, hyperlipidemia, hypertension, pacemaker placement, paroxysmal atrial fibrillation, PTSD. CT head negative    PT Comments    A&O x3 Pt was agreeable to session today. Pt continues to state he cannot ambulate and does not ambulate at home. Pt was able to stand from EOB with hand held assist and demonstrated good standing balance with turning to side to wash hands in bathroom without using UE for support.  Pt continues to need cues to pick up RLE during gait and to extend his truck. Pt is having much difficulty keeping RLE from trailing posteriorly during ambulation. Pt will benefit from continued PT to increase his independence and safety with mobility.    Follow Up Recommendations  Home health PT;SNF     Equipment Recommendations  None recommended by PT    Recommendations for Other Services       Precautions / Restrictions Precautions Precautions: Fall Restrictions Weight Bearing Restrictions: No    Mobility  Bed Mobility Overal bed mobility: Needs Assistance Bed Mobility: Supine to Sit     Supine to sit: Supervision          Transfers Overall transfer level: Needs assistance Equipment used: 1 person hand held assist Transfers: Sit to/from Stand Sit to Stand: Min guard;Min assist         General transfer comment: min guard to stand from EOB, min assist to stand from commode  Ambulation/Gait Ambulation/Gait assistance: Min assist Gait Distance (Feet): 125 Feet Assistive device: Rolling walker (2 wheeled) Gait Pattern/deviations: Step-through pattern;Decreased stride length;Narrow base of support;Trunk flexed     General  Gait Details: cues for RW, pt ambualted to bathroom and back, ambulated in hallway with mask. cues to look up and ahead to extend trunk.   Stairs             Wheelchair Mobility    Modified Rankin (Stroke Patients Only)       Balance Overall balance assessment: Needs assistance;History of Falls Sitting-balance support: Feet supported           Standing balance comment: Relaint on 1-2 hand support and external assistance.                            Cognition Arousal/Alertness: Awake/alert Behavior During Therapy: WFL for tasks assessed/performed Overall Cognitive Status: No family/caregiver present to determine baseline cognitive functioning Area of Impairment: Following commands;Problem solving                       Following Commands: Follows one step commands with increased time     Problem Solving: Slow processing;Requires verbal cues;Requires tactile cues        Exercises      General Comments        Pertinent Vitals/Pain Pain Assessment: Faces Faces Pain Scale: Hurts a little bit Pain Location: low back Pain Descriptors / Indicators: Sore;Aching Pain Intervention(s): Limited activity within patient's tolerance    Home Living                      Prior Function  PT Goals (current goals can now be found in the care plan section) Acute Rehab PT Goals Patient Stated Goal: To go to Rehab and get stronger. PT Goal Formulation: With patient Time For Goal Achievement: 08/11/20 Potential to Achieve Goals: Fair    Frequency    Min 2X/week      PT Plan      Co-evaluation              AM-PAC PT "6 Clicks" Mobility   Outcome Measure  Help needed turning from your back to your side while in a flat bed without using bedrails?: A Little Help needed moving from lying on your back to sitting on the side of a flat bed without using bedrails?: A Little Help needed moving to and from a bed to a chair  (including a wheelchair)?: A Little Help needed standing up from a chair using your arms (e.g., wheelchair or bedside chair)?: A Little Help needed to walk in hospital room?: A Little Help needed climbing 3-5 steps with a railing? : A Lot 6 Click Score: 17    End of Session Equipment Utilized During Treatment: Gait belt Activity Tolerance: Patient limited by fatigue Patient left: in bed;with call bell/phone within reach Nurse Communication: Mobility status PT Visit Diagnosis: Other abnormalities of gait and mobility (R26.89);Difficulty in walking, not elsewhere classified (R26.2);Muscle weakness (generalized) (M62.81)     Time: 7841-2820 PT Time Calculation (min) (ACUTE ONLY): 23 min  Charges:  $Gait Training: 8-22 mins $Therapeutic Activity: 8-22 mins                    Ernst Spell, PTA Student  Acute Rehabilitation Services Pager : [phone] Office : [phone]

## 2020-08-01 DIAGNOSIS — D649 Anemia, unspecified: Secondary | ICD-10-CM | POA: Diagnosis not present

## 2020-08-01 DIAGNOSIS — I951 Orthostatic hypotension: Secondary | ICD-10-CM | POA: Diagnosis not present

## 2020-08-01 DIAGNOSIS — Z7401 Bed confinement status: Secondary | ICD-10-CM | POA: Diagnosis not present

## 2020-08-01 DIAGNOSIS — I2510 Atherosclerotic heart disease of native coronary artery without angina pectoris: Secondary | ICD-10-CM | POA: Diagnosis not present

## 2020-08-01 DIAGNOSIS — I459 Conduction disorder, unspecified: Secondary | ICD-10-CM | POA: Diagnosis not present

## 2020-08-01 DIAGNOSIS — I441 Atrioventricular block, second degree: Secondary | ICD-10-CM | POA: Diagnosis not present

## 2020-08-01 DIAGNOSIS — R2681 Unsteadiness on feet: Secondary | ICD-10-CM | POA: Diagnosis not present

## 2020-08-01 DIAGNOSIS — G8929 Other chronic pain: Secondary | ICD-10-CM | POA: Diagnosis not present

## 2020-08-01 DIAGNOSIS — M545 Low back pain, unspecified: Secondary | ICD-10-CM | POA: Diagnosis not present

## 2020-08-01 DIAGNOSIS — R627 Adult failure to thrive: Secondary | ICD-10-CM | POA: Diagnosis not present

## 2020-08-01 DIAGNOSIS — M5442 Lumbago with sciatica, left side: Secondary | ICD-10-CM | POA: Diagnosis not present

## 2020-08-01 DIAGNOSIS — Z950 Presence of cardiac pacemaker: Secondary | ICD-10-CM | POA: Diagnosis not present

## 2020-08-01 DIAGNOSIS — I739 Peripheral vascular disease, unspecified: Secondary | ICD-10-CM | POA: Diagnosis not present

## 2020-08-01 DIAGNOSIS — Z9181 History of falling: Secondary | ICD-10-CM | POA: Diagnosis not present

## 2020-08-01 DIAGNOSIS — I480 Paroxysmal atrial fibrillation: Secondary | ICD-10-CM | POA: Diagnosis not present

## 2020-08-01 DIAGNOSIS — I701 Atherosclerosis of renal artery: Secondary | ICD-10-CM | POA: Diagnosis not present

## 2020-08-01 DIAGNOSIS — R5381 Other malaise: Secondary | ICD-10-CM | POA: Diagnosis not present

## 2020-08-01 DIAGNOSIS — I160 Hypertensive urgency: Secondary | ICD-10-CM | POA: Diagnosis not present

## 2020-08-01 DIAGNOSIS — R531 Weakness: Secondary | ICD-10-CM | POA: Diagnosis not present

## 2020-08-01 DIAGNOSIS — I69828 Other speech and language deficits following other cerebrovascular disease: Secondary | ICD-10-CM | POA: Diagnosis not present

## 2020-08-01 DIAGNOSIS — R1312 Dysphagia, oropharyngeal phase: Secondary | ICD-10-CM | POA: Diagnosis not present

## 2020-08-01 DIAGNOSIS — R41841 Cognitive communication deficit: Secondary | ICD-10-CM | POA: Diagnosis not present

## 2020-08-01 DIAGNOSIS — M6281 Muscle weakness (generalized): Secondary | ICD-10-CM | POA: Diagnosis not present

## 2020-08-01 DIAGNOSIS — Z20822 Contact with and (suspected) exposure to covid-19: Secondary | ICD-10-CM | POA: Diagnosis not present

## 2020-08-01 DIAGNOSIS — I10 Essential (primary) hypertension: Secondary | ICD-10-CM | POA: Diagnosis not present

## 2020-08-01 DIAGNOSIS — M1611 Unilateral primary osteoarthritis, right hip: Secondary | ICD-10-CM | POA: Diagnosis not present

## 2020-08-01 LAB — SARS CORONAVIRUS 2 (TAT 6-24 HRS): SARS Coronavirus 2: NEGATIVE

## 2020-08-01 MED ORDER — TRAZODONE HCL 50 MG PO TABS: 100.0000 mg | ORAL_TABLET | Freq: Every day | ORAL | Status: DC

## 2020-08-01 MED ORDER — QUETIAPINE FUMARATE 25 MG PO TABS: 25.0000 mg | ORAL_TABLET | Freq: Every day | ORAL | Status: DC

## 2020-08-01 MED ORDER — METHOCARBAMOL 500 MG PO TABS: 500.0000 mg | ORAL_TABLET | Freq: Three times a day (TID) | ORAL | 0 refills | Status: DC

## 2020-08-01 MED ORDER — CYCLOBENZAPRINE HCL 10 MG PO TABS: 10.0000 mg | ORAL_TABLET | Freq: Every day | ORAL | 0 refills | Status: DC | PRN

## 2020-08-01 MED ORDER — TAMSULOSIN HCL 0.4 MG PO CAPS: 0.4000 mg | ORAL_CAPSULE | Freq: Every day | ORAL | Status: DC

## 2020-08-01 MED ORDER — METHOCARBAMOL 500 MG PO TABS
500.0000 mg | ORAL_TABLET | Freq: Three times a day (TID) | ORAL | Status: DC
  Administered 2020-08-01: 500 mg via ORAL
  Filled 2020-08-01: qty 1

## 2020-08-01 MED ORDER — DULOXETINE HCL 20 MG PO CPEP
40.0000 mg | ORAL_CAPSULE | Freq: Every day | ORAL | Status: DC
  Administered 2020-08-01: 40 mg via ORAL
  Filled 2020-08-01: qty 2

## 2020-08-01 MED ORDER — VENLAFAXINE HCL ER 75 MG PO CP24: 225.0000 mg | ORAL_CAPSULE | Freq: Every day | ORAL | Status: DC

## 2020-08-01 MED ORDER — METHOCARBAMOL 500 MG PO TABS: 500.0000 mg | ORAL_TABLET | Freq: Three times a day (TID) | ORAL | 0 refills | Status: DC | PRN

## 2020-08-01 MED ORDER — PANTOPRAZOLE SODIUM 40 MG PO TBEC
40.0000 mg | DELAYED_RELEASE_TABLET | Freq: Every day | ORAL | Status: DC
  Administered 2020-08-01: 40 mg via ORAL

## 2020-08-01 MED ORDER — ATENOLOL 50 MG PO TABS: 100.0000 mg | ORAL_TABLET | Freq: Every day | ORAL | Status: DC

## 2020-08-01 MED ORDER — COVID-19 MRNA VACC (MODERNA) 50 MCG/0.25ML IM SUSP
0.2500 mL | Freq: Once | INTRAMUSCULAR | Status: AC
  Administered 2020-08-01: 0.25 mL via INTRAMUSCULAR
  Filled 2020-08-01: qty 0.25

## 2020-08-01 NOTE — Progress Notes (Signed)
Just received report from [place], Therapist, sports.  Assessment remains unchanged. Roderick Pee

## 2020-08-01 NOTE — Discharge Summary (Addendum)
Physician Discharge Summary  COURTENAY CREGER TOI:[id number] DOB: 04-Dec-1940 DOA: 07/28/2020  PCP: Burnard Bunting, MD  Admit date: 07/28/2020 Discharge date: 08/01/2020 30 Day Unplanned Readmission Risk Score    Flowsheet Row ED to Hosp-Admission (Current) from 07/28/2020 in [address] [facility]  30 Day Unplanned Readmission Risk Score (%) 15.66 Filed at 08/01/2020 0400       This score is the patient's risk of an unplanned readmission within 30 days of being discharged (0 -100%). The score is based on dignosis, age, lab data, medications, orders, and past utilization.   Low:  0-14.9   Medium: 15-21.9   High: 22-29.9   Extreme: 30 and above           Admitted From: Home Disposition: SNF  Recommendations for Outpatient Follow-up:  Follow up with PCP in 1-2 weeks Follow-up with neurology for cognitive evaluation Please obtain BMP/CBC in one week Please follow up with your PCP on the following pending results: Unresulted Labs (From admission, onward)     Start     Ordered   07/28/20 1753  Vitamin B1  Once,   R        07/28/20 1752   07/28/20 1753  Vitamin B6  Once,   R        07/28/20 [place]: None Equipment/Devices: None  Discharge Condition: Stable CODE STATUS: Full code Diet recommendation: Cardiac  Subjective: Seen and examined.  No new complaint.  Still with right lower extremity weakness.  Brief/Interim Summary: Kevin Wiley  is a 80 y.o. male, with medical history of bilateral renal artery stenosis s/p stents in 1999, BPH, coronary artery disease with BMS to LAD, chronic low back pain, chronic disease, hyperlipidemia, hypertension, pacemaker placement, paroxysmal atrial fibrillation, came to hospital for low back pain and right leg weakness.  Patient was unable to get out of bed and after son called EMS.  No other complaint. CT head was negative, MRI brain could not be obtained as patient has pacemaker in place. Patient states that his  low back pain is chronic but cannot tell me the exact time with certainty.  Poor historian.  No other focal deficit so I personally doubt any stroke, MRI neither indicated not can be done due to pacemaker.  CT lumbar spine shows multilevel degenerative changes with mild to moderate canal stenosis at L3-L4 with left foraminal stenosis and nothing acute or severe.  Interestingly, his symptoms are on the right side.  Likely due to degenerative disc disease.  Seen by PT OT.  Per them, patient had generalized symmetrical lower extremity weakness as opposed to right lower extremity weakness that he has been mentioning.  They recommend home health or SNF.  Discussed with patient and his son, patient is living alone at this point in time as his wife is at a nursing home and he is not capable of taking care of himself and there is no one else to help so they prefer to go to SNF.  He and his son prefers SNF.  TOC consulted and SNF has been arranged for him and he is going to be discharged in stable condition today.   Cognitive decline/decreased hearing: Seems to have some mild underlying cognitive deficit and he is already hard of hearing.  However he is able to communicate well.  Recommend follow-up with neurology for formal dementia work-up.   Essential hypertension: Blood pressure fairly controlled, continue  current scheduled hydralazine and atenolol.   History of CAD s/p stent: Asymptomatic, continue atenolol, aspirin and Lipitor.   SSS/second-degree AV block/PAT/RAS: Has dual-chamber permanent pacemaker.  Follows with cardiology as outpatient.   Discharge Diagnoses:  Active Problems:   HTN (hypertension)   Hyperlipidemia, mixed   CAD (coronary artery disease)   SSS (sick sinus syndrome) (HCC)   Low back pain   Paroxysmal atrial tachycardia (HCC)   Chest pain with high risk for cardiac etiology   Anxiety and depression   BPH (benign prostatic hyperplasia)   Right leg weakness    Discharge  Instructions   Allergies as of 08/01/2020   No Known Allergies      Medication List     STOP taking these medications    cyclobenzaprine 10 MG tablet Commonly known as: FLEXERIL   venlafaxine XR 75 MG 24 hr capsule Commonly known as: EFFEXOR-XR   zolpidem 10 MG tablet Commonly known as: AMBIEN       TAKE these medications    aspirin EC 81 MG tablet Take 1 tablet (81 mg total) by mouth daily. Swallow whole.   atenolol 100 MG tablet Commonly known as: TENORMIN Take 100 mg by mouth daily.   atorvastatin 80 MG tablet Commonly known as: LIPITOR Take 1 tablet (80 mg total) by mouth at bedtime.   DULoxetine 20 MG capsule Commonly known as: CYMBALTA Take 40 mg by mouth daily.   gabapentin 800 MG tablet Commonly known as: NEURONTIN Take 1 tablet (800 mg total) by mouth 3 (three) times daily.   hydrALAZINE 50 MG tablet Commonly known as: APRESOLINE Take 1 tablet (50 mg total) by mouth 3 (three) times daily.   methocarbamol 500 MG tablet Commonly known as: ROBAXIN Take 1 tablet (500 mg total) by mouth every 8 (eight) hours as needed for muscle spasms. What changed:  when to take this reasons to take this   nitroGLYCERIN 0.4 MG SL tablet Commonly known as: NITROSTAT Place 1 tablet (0.4 mg total) under the tongue every 5 (five) minutes as needed for chest pain (times 3 times then call MD).   omeprazole 20 MG capsule Commonly known as: PRILOSEC Take 1 capsule (20 mg total) by mouth 2 (two) times daily before a meal.   QUEtiapine 25 MG tablet Commonly known as: SEROQUEL Take 25 mg by mouth at bedtime.   tamsulosin 0.4 MG Caps capsule Commonly known as: FLOMAX Take 1 capsule (0.4 mg total) by mouth at bedtime.   traZODone 100 MG tablet Commonly known as: DESYREL Take 1 tablet (100 mg total) by mouth at bedtime.   vitamin B-12 100 MCG tablet Commonly known as: CYANOCOBALAMIN Take 100 mcg by mouth daily.        Contact information for follow-up  providers     Burnard Bunting, MD Follow up in 1 week(s).   Specialty: Internal Medicine Contact information: [place] [address] [phone]         Sanda Klein, MD .   Specialty: Cardiology Contact information: [address] [place] [address] [phone]              Contact information for after-discharge care     Destination     [facility] .   Service: Skilled Nursing Contact information: [address] [place] [phone]                    No Known Allergies  Consultations: None   Procedures/Studies: CT Head Wo Contrast  Result Date: 07/28/2020 CLINICAL DATA:  Fatigue x2 days. EXAM: CT HEAD WITHOUT CONTRAST TECHNIQUE: Contiguous axial images were obtained from the base of the skull through the vertex without intravenous contrast. COMPARISON:  February 16, 2020 FINDINGS: Brain: There is mild cerebral atrophy with widening of the extra-axial spaces and ventricular dilatation. There are areas of decreased attenuation within the white matter tracts of the supratentorial brain, consistent with microvascular disease changes. Vascular: No hyperdense vessel or unexpected calcification. Skull: Normal. Negative for fracture or focal lesion. Sinuses/Orbits: No acute finding. Other: None. IMPRESSION: 1. Generalized cerebral atrophy. 2. No acute intracranial abnormality. Electronically Signed   By: Virgina Norfolk M.D.   On: 07/28/2020 15:33   CT LUMBAR SPINE WO CONTRAST  Result Date: 07/28/2020 CLINICAL DATA:  Back pain EXAM: CT LUMBAR SPINE WITHOUT CONTRAST TECHNIQUE: Multidetector CT imaging of the lumbar spine was performed without intravenous contrast administration. Multiplanar CT image reconstructions were also generated. COMPARISON:  CT lumbar spine 02/15/2017. FINDINGS: Segmentation: 5 non rib-bearing lumbar vertebral bodies. Alignment: No substantial  sagittal subluxation. Mild dextrocurvature in the upper lumbar spine with slight compensatory levocurvature in the lower lumbar spine. Vertebrae: Similar vertebral body heights. No evidence of acute fracture. Degenerative Schmorl's node involving the superior L5 endplate. Paraspinal and other soft tissues: In comparison to recent CTA from May 05, 2020, similar size of an abdominal aortic aneurysm. Disc levels: T12-L1: Mild facet hypertrophy and disc bulging without significant stenosis. L1-L2: Mild facet hypertrophy and disc bulging without significant stenosis. L2-L3: Left greater than right facet hypertrophy. Left eccentric disc bulge with similar mild left foraminal and left subarticular recess stenosis. L3-L4: Broad disc bulge with moderate bilateral facet hypertrophy and ligamentum flavum thickening. Left foraminal disc protrusion which likely contacts the exiting left L3 nerve with at least moderate left foraminal stenosis which is likely progressed. Bilateral subarticular recess stenosis with at least mild-to-moderate canal stenosis. L4-L5: Broad disc bulge with ligamentum flavum thickening and moderate to severe facet hypertrophy. Bilateral subarticular recess and and likely moderate foraminal stenosis. At least mild canal stenosis. L5-S1: Broad disc bulge and moderate facet hypertrophy, worse on the right. Likely mild subarticular recess stenosis without significant canal stenosis. IMPRESSION: 1. Multilevel degenerative change without evidence of acute bony abnormality. 2. At L3-L4, there is at least mild to moderate canal stenosis with bilateral subarticular recess stenosis and at least moderate left foraminal stenosis. 3. At L4-L5 there is at least mild canal stenosis with bilateral subarticular recess stenosis and likely moderate bilateral foraminal stenosis. 4. At L3-L4, there is likely mild left foraminal and subarticular recess stenosis. 5. If the patient is able, an MRI could better characterize the  canal and foramina. 6. In comparison to recent CTA from May 05, 2020, similar size of an abdominal aortic aneurysm. Please see that study for characterization and follow-up recommendations. Electronically Signed   By: Margaretha Sheffield MD   On: 07/28/2020 18:01   DG Chest Port 1 View  Result Date: 07/28/2020 CLINICAL DATA:  [place] EXAM: PORTABLE CHEST 1 VIEW COMPARISON:  02/15/2017 FINDINGS: Cardiomegaly. Left chest multi lead pacer. Both lungs are clear. The visualized skeletal structures are unremarkable. IMPRESSION: Cardiomegaly without acute abnormality of the lungs in AP portable projection. Electronically Signed   By: Eddie Candle M.D.   On: 07/28/2020 13:59     Discharge Exam: Vitals:   07/31/20 2132 08/01/20 0545  BP: (!) 164/90 (!) 144/79  Pulse: 61 65  Resp:  18  Temp: 98.4 F (36.9 C) 98 F (36.7 C)  SpO2: 99% 99%   Vitals:   07/31/20 0545 07/31/20 2132 07/31/20 2132 08/01/20 0545  BP: (!) 160/81 (!) 164/90 (!) 164/90 (!) 144/79  Pulse: 61  61 65  Resp: 16   18  Temp: 97.7 F (36.5 C)  98.4 F (36.9 C) 98 F (36.7 C)  TempSrc: Oral  Oral Oral  SpO2: 98%  99% 99%  Weight:      Height:        General: Pt is alert, awake, not in acute distress Cardiovascular: RRR, S1/S2 +, no rubs, no gallops Respiratory: CTA bilaterally, no wheezing, no rhonchi Abdominal: Soft, NT, ND, bowel sounds + Extremities: no edema, no cyanosis    The results of significant diagnostics from this hospitalization (including imaging, microbiology, ancillary and laboratory) are listed below for reference.     Microbiology: Recent Results (from the past 240 hour(s))  Resp Panel by RT-PCR (Flu A&B, Covid) Nasopharyngeal Swab     Status: None   Collection Time: 07/28/20  1:28 PM   Specimen: Nasopharyngeal Swab; Nasopharyngeal(NP) swabs in vial transport medium  Result Value Ref Range Status   SARS Coronavirus 2 by RT PCR NEGATIVE NEGATIVE Final    Comment: (NOTE) SARS-CoV-2 target  nucleic acids are NOT DETECTED.  The SARS-CoV-2 RNA is generally detectable in upper respiratory specimens during the acute phase of infection. The lowest concentration of SARS-CoV-2 viral copies this assay can detect is 138 copies/mL. A negative result does not preclude SARS-Cov-2 infection and should not be used as the sole basis for treatment or other patient management decisions. A negative result may occur with  improper specimen collection/handling, submission of specimen other than nasopharyngeal swab, presence of viral mutation(s) within the areas targeted by this assay, and inadequate number of viral copies(<138 copies/mL). A negative result must be combined with clinical observations, patient history, and epidemiological information. The expected result is Negative.  Fact Sheet for Patients:  EntrepreneurPulse.com.au  Fact Sheet for Healthcare Providers:  IncredibleEmployment.be  This test is no t yet approved or cleared by the [place] FDA and  has been authorized for detection and/or diagnosis of SARS-CoV-2 by FDA under an Emergency Use Authorization (EUA). This EUA will remain  in effect (meaning this test can be used) for the duration of the COVID-19 declaration under Section 564(b)(1) of the Act, 21 U.S.C.section 360bbb-3(b)(1), unless the authorization is terminated  or revoked sooner.       Influenza A by PCR NEGATIVE NEGATIVE Final   Influenza B by PCR NEGATIVE NEGATIVE Final    Comment: (NOTE) The Xpert Xpress SARS-CoV-2/FLU/RSV plus assay is intended as an aid in the diagnosis of influenza from Nasopharyngeal swab specimens and should not be used as a sole basis for treatment. Nasal washings and aspirates are unacceptable for Xpert Xpress SARS-CoV-2/FLU/RSV testing.  Fact Sheet for Patients: EntrepreneurPulse.com.au  Fact Sheet for Healthcare  Providers: IncredibleEmployment.be  This test is not yet approved or cleared by the [place] FDA and has been authorized for detection and/or diagnosis of SARS-CoV-2 by FDA under an Emergency Use Authorization (EUA). This EUA will remain in effect (meaning this test can be used) for the duration of the COVID-19 declaration under Section 564(b)(1) of the Act, 21 U.S.C. section 360bbb-3(b)(1), unless the authorization is terminated or revoked.  Performed at [facility], [place] [address] [place], [place] [address]   SARS CORONAVIRUS 2 (TAT 6-24 HRS) Nasopharyngeal Nasopharyngeal Swab  Status: None   Collection Time: 07/31/20  3:43 PM   Specimen: Nasopharyngeal Swab  Result Value Ref Range Status   SARS Coronavirus 2 NEGATIVE NEGATIVE Final    Comment: (NOTE) SARS-CoV-2 target nucleic acids are NOT DETECTED.  The SARS-CoV-2 RNA is generally detectable in upper and lower respiratory specimens during the acute phase of infection. Negative results do not preclude SARS-CoV-2 infection, do not rule out co-infections with other pathogens, and should not be used as the sole basis for treatment or other patient management decisions. Negative results must be combined with clinical observations, patient history, and epidemiological information. The expected result is Negative.  Fact Sheet for Patients: SugarRoll.be  Fact Sheet for Healthcare Providers: https://www.woods-mathews.com/  This test is not yet approved or cleared by the [place] FDA and  has been authorized for detection and/or diagnosis of SARS-CoV-2 by FDA under an Emergency Use Authorization (EUA). This EUA will remain  in effect (meaning this test can be used) for the duration of the COVID-19 declaration under Se ction 564(b)(1) of the Act, 21 U.S.C. section 360bbb-3(b)(1), unless the authorization is terminated or revoked  sooner.  Performed at [facility], [place] [address] [place], [place] [address]      Labs: BNP (last 3 results) Recent Labs    07/28/20 1300  BNP 026.3*   Basic Metabolic Panel: Recent Labs  Lab 07/28/20 1300 07/29/20 0554  NA 140 140  K 4.1 4.1  CL 103 104  CO2 29 30  GLUCOSE 106* 101*  BUN 26* 15  CREATININE 1.10 1.04  CALCIUM 9.2 9.1   Liver Function Tests: Recent Labs  Lab 07/28/20 1300 07/29/20 0554  AST 22 19  ALT 15 13  ALKPHOS 53 51  BILITOT 0.5 0.6  PROT 6.7 6.5  ALBUMIN 4.2 3.8   No results for input(s): LIPASE, AMYLASE in the last 168 hours. No results for input(s): AMMONIA in the last 168 hours. CBC: Recent Labs  Lab 07/28/20 1300 07/29/20 0554  WBC 7.8 6.2  NEUTROABS 5.6  --   HGB 13.6 13.2  HCT 40.2 40.2  MCV 93.3 94.1  PLT 166 191   Cardiac Enzymes: No results for input(s): CKTOTAL, CKMB, CKMBINDEX, TROPONINI in the last 168 hours. BNP: Invalid input(s): POCBNP CBG: No results for input(s): GLUCAP in the last 168 hours. D-Dimer No results for input(s): DDIMER in the last 72 hours. Hgb A1c No results for input(s): HGBA1C in the last 72 hours. Lipid Profile No results for input(s): CHOL, HDL, LDLCALC, TRIG, CHOLHDL, LDLDIRECT in the last 72 hours. Thyroid function studies No results for input(s): TSH, T4TOTAL, T3FREE, THYROIDAB in the last 72 hours.  Invalid input(s): FREET3 Anemia work up No results for input(s): VITAMINB12, FOLATE, FERRITIN, TIBC, IRON, RETICCTPCT in the last 72 hours. Urinalysis    Component Value Date/Time   COLORURINE YELLOW 07/28/2020 1314   APPEARANCEUR CLEAR 07/28/2020 1314   LABSPEC 1.016 07/28/2020 1314   PHURINE 6.0 07/28/2020 1314   GLUCOSEU NEGATIVE 07/28/2020 1314   HGBUR NEGATIVE 07/28/2020 1314   BILIRUBINUR NEGATIVE 07/28/2020 1314   [place] 07/28/2020 1314   PROTEINUR NEGATIVE 07/28/2020 1314   UROBILINOGEN 0.2 05/03/2013 1327   NITRITE NEGATIVE 07/28/2020 1314    LEUKOCYTESUR NEGATIVE 07/28/2020 1314   Sepsis Labs Invalid input(s): PROCALCITONIN,  WBC,  LACTICIDVEN Microbiology Recent Results (from the past 240 hour(s))  Resp Panel by RT-PCR (Flu A&B, Covid) Nasopharyngeal Swab     Status: None   Collection Time: 07/28/20  1:28 PM   Specimen: Nasopharyngeal Swab; Nasopharyngeal(NP) swabs in vial transport medium  Result Value Ref Range Status   SARS Coronavirus 2 by RT PCR NEGATIVE NEGATIVE Final    Comment: (NOTE) SARS-CoV-2 target nucleic acids are NOT DETECTED.  The SARS-CoV-2 RNA is generally detectable in upper respiratory specimens during the acute phase of infection. The lowest concentration of SARS-CoV-2 viral copies this assay can detect is 138 copies/mL. A negative result does not preclude SARS-Cov-2 infection and should not be used as the sole basis for treatment or other patient management decisions. A negative result may occur with  improper specimen collection/handling, submission of specimen other than nasopharyngeal swab, presence of viral mutation(s) within the areas targeted by this assay, and inadequate number of viral copies(<138 copies/mL). A negative result must be combined with clinical observations, patient history, and epidemiological information. The expected result is Negative.  Fact Sheet for Patients:  EntrepreneurPulse.com.au  Fact Sheet for Healthcare Providers:  IncredibleEmployment.be  This test is no t yet approved or cleared by the [place] FDA and  has been authorized for detection and/or diagnosis of SARS-CoV-2 by FDA under an Emergency Use Authorization (EUA). This EUA will remain  in effect (meaning this test can be used) for the duration of the COVID-19 declaration under Section 564(b)(1) of the Act, 21 U.S.C.section 360bbb-3(b)(1), unless the authorization is terminated  or revoked sooner.       Influenza A by PCR NEGATIVE NEGATIVE Final   Influenza B  by PCR NEGATIVE NEGATIVE Final    Comment: (NOTE) The Xpert Xpress SARS-CoV-2/FLU/RSV plus assay is intended as an aid in the diagnosis of influenza from Nasopharyngeal swab specimens and should not be used as a sole basis for treatment. Nasal washings and aspirates are unacceptable for Xpert Xpress SARS-CoV-2/FLU/RSV testing.  Fact Sheet for Patients: EntrepreneurPulse.com.au  Fact Sheet for Healthcare Providers: IncredibleEmployment.be  This test is not yet approved or cleared by the [place] FDA and has been authorized for detection and/or diagnosis of SARS-CoV-2 by FDA under an Emergency Use Authorization (EUA). This EUA will remain in effect (meaning this test can be used) for the duration of the COVID-19 declaration under Section 564(b)(1) of the Act, 21 U.S.C. section 360bbb-3(b)(1), unless the authorization is terminated or revoked.  Performed at [facility], [place] [address] [place], [place] [address]   SARS CORONAVIRUS 2 (TAT 6-24 HRS) Nasopharyngeal Nasopharyngeal Swab     Status: None   Collection Time: 07/31/20  3:43 PM   Specimen: Nasopharyngeal Swab  Result Value Ref Range Status   SARS Coronavirus 2 NEGATIVE NEGATIVE Final    Comment: (NOTE) SARS-CoV-2 target nucleic acids are NOT DETECTED.  The SARS-CoV-2 RNA is generally detectable in upper and lower respiratory specimens during the acute phase of infection. Negative results do not preclude SARS-CoV-2 infection, do not rule out co-infections with other pathogens, and should not be used as the sole basis for treatment or other patient management decisions. Negative results must be combined with clinical observations, patient history, and epidemiological information. The expected result is Negative.  Fact Sheet for Patients: SugarRoll.be  Fact Sheet for Healthcare  Providers: https://www.woods-mathews.com/  This test is not yet approved or cleared by the [place] FDA and  has been authorized for detection and/or diagnosis of SARS-CoV-2 by FDA under an Emergency Use Authorization (EUA). This EUA will remain  in effect (meaning this test can be used) for the duration of the COVID-19 declaration under Se ction 564(b)(1) of the  Act, 21 U.S.C. section 360bbb-3(b)(1), unless the authorization is terminated or revoked sooner.  Performed at [facility], [place] [address], [place], [place] [address]      Time coordinating discharge: Over 30 minutes  SIGNED:   Darliss Cheney, MD  [facility] 08/01/2020, 10:08 AM  If 7PM-7AM, please contact night-coverage [email or web address]

## 2020-08-01 NOTE — TOC Transition Note (Signed)
Transition of Care [facility]) - CM/SW Discharge Note   Patient Details  Name: Kevin Wiley MRN: [id number] Date of Birth: 10-30-1940  [facility] CM/SW Contact:  Lynnell Catalan, RN Phone Number: 08/01/2020, 12:54 PM   Clinical Narrative:    [facility] chosen by pt and son Shanon Brow. [facility] liaison requesting pt have Covid booster shot prior to dc. Pt agreed to receiving Covid booster and chose [facility]. [facility] contacted for transport to SNF. RN to call report to [phone].   Final next level of care: Skilled Nursing Facility Barriers to Discharge: No Barriers Identified   Patient Goals and CMS Choice Patient states their goals for this hospitalization and ongoing recovery are:: "I don't know" CMS Medicare.gov Compare Post Acute Care list provided to:: Patient Choice offered to / list presented to : Patient  Discharge Placement              Patient [facility] bed at: [facility] Patient to be transferred to facility by: [facility] Name of family member notified: Shanon Brow Patient and family notified of of transfer: 08/01/20  Discharge Plan and Services In-house Referral: Clinical Social Work   Post Acute Care Choice: [facility]                               Social Determinants of Health (SDOH) Interventions     Readmission Risk Interventions No flowsheet data found.

## 2020-08-01 NOTE — Progress Notes (Signed)
Attempted several times to call report to [facility] with no success.

## 2020-08-02 DIAGNOSIS — M545 Low back pain, unspecified: Secondary | ICD-10-CM | POA: Diagnosis not present

## 2020-08-02 DIAGNOSIS — I10 Essential (primary) hypertension: Secondary | ICD-10-CM | POA: Diagnosis not present

## 2020-08-02 DIAGNOSIS — R5381 Other malaise: Secondary | ICD-10-CM | POA: Diagnosis not present

## 2020-08-02 DIAGNOSIS — R627 Adult failure to thrive: Secondary | ICD-10-CM | POA: Diagnosis not present

## 2020-08-02 LAB — VITAMIN B1: Vitamin B1 (Thiamine): 124.8 nmol/L (ref 66.5–200.0)

## 2020-08-05 LAB — VITAMIN B6: Vitamin B6: 5.2 ug/L (ref 3.4–65.2)

## 2020-08-10 DIAGNOSIS — R627 Adult failure to thrive: Secondary | ICD-10-CM | POA: Diagnosis not present

## 2020-08-10 DIAGNOSIS — R5381 Other malaise: Secondary | ICD-10-CM | POA: Diagnosis not present

## 2020-08-10 DIAGNOSIS — M545 Low back pain, unspecified: Secondary | ICD-10-CM | POA: Diagnosis not present

## 2020-08-10 DIAGNOSIS — I701 Atherosclerosis of renal artery: Secondary | ICD-10-CM | POA: Diagnosis not present

## 2020-08-14 ENCOUNTER — Other Ambulatory Visit: Payer: Self-pay | Admitting: *Deleted

## 2020-08-14 NOTE — Patient Outreach (Signed)
Member screened for potential [facility] Care Management needs. Mr. Trickett resides in [facility].   Communication sent to SNF SW to inquire about anticipated transition plans.  Will continue to follow for potential [facility] Care Management services while member resides in SNF.    Marthenia Rolling, MSN, RN,BSN [place] Acute Care Coordinator [phone] [facility]) [phone]  (Toll free office)

## 2020-08-17 ENCOUNTER — Other Ambulatory Visit: Payer: Self-pay | Admitting: *Deleted

## 2020-08-17 NOTE — Patient Outreach (Signed)
[facility] Post- Acute Care Coordinator follow up. Mr. Dufault remains in [facility].   Update received from SNF SW indicating member's primary contact is his son Lukasz Rogus. There is anticipated transition date at this time. Mr. Russomanno is hard of hearing.   Will continue to follow and plan outreach to [facility] son Shanon Brow, as appropriate, to discuss [place] Management follow up.   Marthenia Rolling, MSN, RN,BSN [place] Acute Care Coordinator [phone] [facility]) [phone]  (Toll free office)

## 2020-08-31 ENCOUNTER — Other Ambulatory Visit: Payer: Self-pay | Admitting: *Deleted

## 2020-08-31 NOTE — Patient Outreach (Signed)
Late entry for 08/30/20.   Member screened for potential [facility] Care Management needs. Per [place] (Patient Kevin Wiley) member resides in [facility].   Communication sent to SNF SW to inquire update on transition plans/date.   Will continue to follow and plan outreach as appropriate to discuss [place] Management services.    Marthenia Rolling, MSN, RN,BSN [place] Acute Care Coordinator [phone] [facility]) [phone]  (Toll free office)

## 2020-09-04 ENCOUNTER — Other Ambulatory Visit: Payer: Self-pay | Admitting: *Deleted

## 2020-09-04 NOTE — Patient Outreach (Signed)
[place] Coordinator follow up. Mr. On resides in [facility]. Member screened for potential [facility] Care Management needs.   Update received from [place]. No definitive transition date at this time.   Son is primary contact. Will continue to followo and plan to outreach as appropriate.    Marthenia Rolling, MSN, RN,BSN [place] Acute Care Coordinator [phone] [facility]) [phone]  (Toll free office)

## 2020-09-14 ENCOUNTER — Other Ambulatory Visit: Payer: Self-pay | Admitting: *Deleted

## 2020-09-14 NOTE — Patient Outreach (Signed)
[facility]N Post- Acute Care Coordinator follow up. Verified in [place] (Patient Kevin Wiley) member discharged from [facility]. Member screened for potential [facility] Care Management services.  Telephone call made to [name] son Kevin Wiley [facility]) [phone]. No answer. HIPAA compliant voicemail message left to request return call.   Marthenia Rolling, MSN, RN,BSN [place] Acute Care Coordinator [phone] [facility]) [phone]  (Toll free office)

## 2020-09-15 ENCOUNTER — Emergency Department (HOSPITAL_COMMUNITY)
Admission: EM | Admit: 2020-09-15 | Discharge: 2020-09-16 | Disposition: A | Discharge status: Home / Self Care | Payer: Medicare Other | Attending: Emergency Medicine | Admitting: Emergency Medicine

## 2020-09-15 ENCOUNTER — Emergency Department (HOSPITAL_COMMUNITY): Payer: Medicare Other

## 2020-09-15 DIAGNOSIS — M549 Dorsalgia, unspecified: Secondary | ICD-10-CM | POA: Diagnosis not present

## 2020-09-15 DIAGNOSIS — R0789 Other chest pain: Secondary | ICD-10-CM | POA: Diagnosis present

## 2020-09-15 DIAGNOSIS — I2510 Atherosclerotic heart disease of native coronary artery without angina pectoris: Secondary | ICD-10-CM | POA: Insufficient documentation

## 2020-09-15 DIAGNOSIS — I700 Atherosclerosis of aorta: Secondary | ICD-10-CM | POA: Diagnosis not present

## 2020-09-15 DIAGNOSIS — Z950 Presence of cardiac pacemaker: Secondary | ICD-10-CM | POA: Diagnosis not present

## 2020-09-15 DIAGNOSIS — R531 Weakness: Secondary | ICD-10-CM | POA: Diagnosis not present

## 2020-09-15 DIAGNOSIS — Z87891 Personal history of nicotine dependence: Secondary | ICD-10-CM | POA: Insufficient documentation

## 2020-09-15 DIAGNOSIS — Z955 Presence of coronary angioplasty implant and graft: Secondary | ICD-10-CM | POA: Insufficient documentation

## 2020-09-15 DIAGNOSIS — F32A Depression, unspecified: Secondary | ICD-10-CM | POA: Diagnosis not present

## 2020-09-15 DIAGNOSIS — R072 Precordial pain: Secondary | ICD-10-CM | POA: Insufficient documentation

## 2020-09-15 DIAGNOSIS — I10 Essential (primary) hypertension: Secondary | ICD-10-CM

## 2020-09-15 DIAGNOSIS — R079 Chest pain, unspecified: Secondary | ICD-10-CM

## 2020-09-15 DIAGNOSIS — R1084 Generalized abdominal pain: Secondary | ICD-10-CM | POA: Diagnosis not present

## 2020-09-15 DIAGNOSIS — Z79899 Other long term (current) drug therapy: Secondary | ICD-10-CM | POA: Insufficient documentation

## 2020-09-15 DIAGNOSIS — R404 Transient alteration of awareness: Secondary | ICD-10-CM | POA: Diagnosis not present

## 2020-09-15 LAB — HEPATIC FUNCTION PANEL
ALT: 16 U/L (ref 0–44)
AST: 18 U/L (ref 15–41)
Albumin: 3.9 g/dL (ref 3.5–5.0)
Alkaline Phosphatase: 72 U/L (ref 38–126)
Bilirubin, Direct: 0.3 mg/dL — ABNORMAL HIGH (ref 0.0–0.2)
Indirect Bilirubin: 1.2 mg/dL — ABNORMAL HIGH (ref 0.3–0.9)
Total Bilirubin: 1.5 mg/dL — ABNORMAL HIGH (ref 0.3–1.2)
Total Protein: 6.7 g/dL (ref 6.5–8.1)

## 2020-09-15 LAB — CBC
HCT: 39.4 % (ref 39.0–52.0)
Hemoglobin: 13.3 g/dL (ref 13.0–17.0)
MCH: 31.4 pg (ref 26.0–34.0)
MCHC: 33.8 g/dL (ref 30.0–36.0)
MCV: 93.1 fL (ref 80.0–100.0)
Platelets: 143 10*3/uL — ABNORMAL LOW (ref 150–400)
RBC: 4.23 MIL/uL (ref 4.22–5.81)
RDW: 13.8 % (ref 11.5–15.5)
WBC: 8 10*3/uL (ref 4.0–10.5)
nRBC: 0 % (ref 0.0–0.2)

## 2020-09-15 LAB — BASIC METABOLIC PANEL
Anion gap: 9 (ref 5–15)
BUN: 10 mg/dL (ref 8–23)
CO2: 26 mmol/L (ref 22–32)
Calcium: 9.2 mg/dL (ref 8.9–10.3)
Chloride: 103 mmol/L (ref 98–111)
Creatinine, Ser: 1.26 mg/dL — ABNORMAL HIGH (ref 0.61–1.24)
GFR, Estimated: 58 mL/min — ABNORMAL LOW (ref >60)
Glucose, Bld: 114 mg/dL — ABNORMAL HIGH (ref 70–99)
Potassium: 3.8 mmol/L (ref 3.5–5.1)
Sodium: 138 mmol/L (ref 135–145)

## 2020-09-15 LAB — LIPASE, BLOOD: Lipase: 28 U/L (ref 11–51)

## 2020-09-15 LAB — RAPID URINE DRUG SCREEN, HOSP PERFORMED
Amphetamines: NOT DETECTED
Barbiturates: NOT DETECTED
Benzodiazepines: NOT DETECTED
Cocaine: NOT DETECTED
Opiates: NOT DETECTED
Tetrahydrocannabinol: POSITIVE — AB

## 2020-09-15 LAB — TROPONIN I (HIGH SENSITIVITY)
Troponin I (High Sensitivity): 13 ng/L (ref <18)
Troponin I (High Sensitivity): 12 ng/L (ref <18)

## 2020-09-15 MED ORDER — ISOSORBIDE MONONITRATE ER 30 MG PO TB24: 30.0000 mg | ORAL_TABLET | Freq: Every day | ORAL | 0 refills | Status: DC

## 2020-09-15 MED ORDER — ALUM & MAG HYDROXIDE-SIMETH 200-200-20 MG/5ML PO SUSP
15.0000 mL | Freq: Once | ORAL | Status: AC
  Administered 2020-09-15: 15 mL via ORAL
  Filled 2020-09-15: qty 30

## 2020-09-15 MED ORDER — OMEPRAZOLE 20 MG PO CPDR: 20.0000 mg | DELAYED_RELEASE_CAPSULE | Freq: Every day | ORAL | 0 refills | Status: DC

## 2020-09-15 NOTE — Discharge Instructions (Addendum)
The blood work looks normal today.  Your symptoms may be related to acid reflux but could also be your heart and the cardiologist will follow-up.  If you start having severe chest pain, shortness of breath, passing out or other concerns return to the hospital.

## 2020-09-15 NOTE — ED Notes (Signed)
Evening meal ordered d/t delay in transport

## 2020-09-15 NOTE — ED Notes (Signed)
Talking on phone

## 2020-09-15 NOTE — ED Notes (Signed)
EDP into room. C/o CP. Alert, NAD, calm, interactive, resps e/u, speaking in clear complete sentences. NSL starrted, repeat trop sent.
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
## 2020-09-15 NOTE — ED Notes (Signed)
Pt attempting to get out of bed, verbalizes ready to leave, assisted back to bed, delay explained, EDP notified, cards arriving to [facility] at this time.

## 2020-09-15 NOTE — ED Notes (Addendum)
Returned to Biomedical scientist. Pt impulsive. Fall risk. Pt made aware of PTAR transport, agreeable.

## 2020-09-15 NOTE — ED Notes (Signed)
Labs, EKG and imaging reviewed.

## 2020-09-15 NOTE — ED Notes (Signed)
Requesting food, and drink. Aware of need for urine.

## 2020-09-15 NOTE — ED Provider Notes (Signed)
Mr. Silliman was evaluated by cardiology and they feel at this time he is safe for discharge home.  They did recommend starting Imdur 30 mg daily.  Also patient will be started on a PPI.  He is anxious to go home and well-appearing at this time.  Cardiology will arrange follow-up.   Blanchie Dessert, MD 09/15/20 [phone]

## 2020-09-15 NOTE — ED Triage Notes (Signed)
Pt here from home with c/o cp upper abd pain , hx from ems , pt would not answer questions when asked

## 2020-09-15 NOTE — ED Notes (Addendum)
Remains impulsive, assisted back to bed, pending PTAR, re-oriented. Pt uncooperative. Redirected. Distracted. Given food.

## 2020-09-15 NOTE — ED Notes (Signed)
Patient will on stay in bed or his room, he is walking around the hallways yelling, "God dammit!"

## 2020-09-15 NOTE — ED Notes (Addendum)
Verbalizes: hasn't been well since [place]. Lives with wife. HOH.
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
## 2020-09-15 NOTE — ED Notes (Addendum)
Happy meal eaten, moved to h/w

## 2020-09-15 NOTE — ED Notes (Signed)
Food OK per EDP, given meal. Pharm tech at [facility].

## 2020-09-15 NOTE — ED Notes (Signed)
Ptar called pt is number 12 on the list

## 2020-09-16 DIAGNOSIS — R1084 Generalized abdominal pain: Secondary | ICD-10-CM | POA: Diagnosis not present

## 2020-09-16 DIAGNOSIS — R531 Weakness: Secondary | ICD-10-CM | POA: Diagnosis not present

## 2020-09-16 DIAGNOSIS — R4182 Altered mental status, unspecified: Secondary | ICD-10-CM | POA: Diagnosis not present

## 2020-09-16 DIAGNOSIS — Z7401 Bed confinement status: Secondary | ICD-10-CM | POA: Diagnosis not present

## 2020-09-19 ENCOUNTER — Other Ambulatory Visit: Payer: Self-pay | Admitting: *Deleted

## 2020-09-19 NOTE — Patient Outreach (Signed)
[facility] Post- Acute Care Coordinator follow up. Member screened for potential [facility] Care Management needs.   Telephone call made to Mr. Casten as he recently discharged from [facility].   Attempted to explain [facility] Care Management services. Mr. Smolen expressed that he was not interested and was slightly argumentative during the call.   No further needs assessed.    Marthenia Rolling, MSN, RN,BSN [place] Acute Care Coordinator [phone] [facility]) [phone]  (Toll free office)

## 2020-09-21 ENCOUNTER — Encounter: Payer: Self-pay | Admitting: Cardiovascular Disease

## 2020-09-21 ENCOUNTER — Ambulatory Visit (INDEPENDENT_AMBULATORY_CARE_PROVIDER_SITE_OTHER): Payer: Medicare Other | Admitting: Cardiovascular Disease

## 2020-09-21 ENCOUNTER — Other Ambulatory Visit: Payer: Self-pay

## 2020-09-21 VITALS — BP 138/78 | HR 71 | Resp 18 | Ht 69.0 in | Wt 154.0 lb

## 2020-09-21 DIAGNOSIS — Z950 Presence of cardiac pacemaker: Secondary | ICD-10-CM | POA: Diagnosis not present

## 2020-09-21 DIAGNOSIS — I208 Other forms of angina pectoris: Secondary | ICD-10-CM | POA: Diagnosis not present

## 2020-09-21 DIAGNOSIS — I951 Orthostatic hypotension: Secondary | ICD-10-CM

## 2020-09-21 DIAGNOSIS — I471 Supraventricular tachycardia: Secondary | ICD-10-CM | POA: Diagnosis not present

## 2020-09-21 DIAGNOSIS — I2089 Other forms of angina pectoris: Secondary | ICD-10-CM

## 2020-09-21 DIAGNOSIS — F419 Anxiety disorder, unspecified: Secondary | ICD-10-CM

## 2020-09-21 DIAGNOSIS — I10 Essential (primary) hypertension: Secondary | ICD-10-CM | POA: Diagnosis not present

## 2020-09-21 DIAGNOSIS — R079 Chest pain, unspecified: Secondary | ICD-10-CM

## 2020-09-21 DIAGNOSIS — I4719 Other supraventricular tachycardia: Secondary | ICD-10-CM

## 2020-09-21 DIAGNOSIS — I714 Abdominal aortic aneurysm, without rupture, unspecified: Secondary | ICD-10-CM

## 2020-09-21 DIAGNOSIS — E782 Mixed hyperlipidemia: Secondary | ICD-10-CM

## 2020-09-21 DIAGNOSIS — I7121 Aneurysm of the ascending aorta, without rupture: Secondary | ICD-10-CM

## 2020-09-21 DIAGNOSIS — F32A Depression, unspecified: Secondary | ICD-10-CM

## 2020-09-21 DIAGNOSIS — I441 Atrioventricular block, second degree: Secondary | ICD-10-CM | POA: Diagnosis not present

## 2020-09-21 DIAGNOSIS — I495 Sick sinus syndrome: Secondary | ICD-10-CM | POA: Diagnosis not present

## 2020-09-21 DIAGNOSIS — I701 Atherosclerosis of renal artery: Secondary | ICD-10-CM

## 2020-09-21 DIAGNOSIS — I712 Thoracic aortic aneurysm, without rupture: Secondary | ICD-10-CM | POA: Diagnosis not present

## 2020-09-21 NOTE — Progress Notes (Signed)
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 At: [facility] [place], [place] [id number] Rush Farmer MD [id number]   Vitamin B6     Status: None   Collection Time: 07/28/20  7:03 PM  Result Value Ref Range   Vitamin B6 5.2 3.4 - 65.2 ug/L    Comment: (NOTE) This test was developed and its performance characteristics determined by [facility]. It has not been cleared or approved by the Food and Drug Administration.                             Deficiency:         <3.4                             Marginal:      3.4 - 5.1                             Adequate:           >5.1 Performed At: [place], [place] [id number] Rush Farmer MD [id number]   CBC      Status: None   Collection Time: 07/29/20  5:54 AM  Result Value Ref Range   WBC 6.2 4.0 - 10.5 K/uL   RBC 4.27 4.22 - 5.81 MIL/uL   Hemoglobin 13.2 13.0 - 17.0 g/dL   HCT 40.2 39.0 - 52.0 %   MCV 94.1 80.0 - 100.0 fL   MCH 30.9 26.0 - 34.0 pg   MCHC 32.8 30.0 - 36.0 g/dL   RDW 14.0 11.5 - 15.5 %   Platelets 191 150 - 400 K/uL   nRBC 0.0 0.0 - 0.2 %  Comment: Performed at [facility], [place] [address], [place], [place] [address]  Comprehensive metabolic panel     Status: Abnormal   Collection Time: 07/29/20  5:54 AM  Result Value Ref Range   Sodium 140 135 - 145 mmol/L   Potassium 4.1 3.5 - 5.1 mmol/L   Chloride 104 98 - 111 mmol/L   CO2 30 22 - 32 mmol/L   Glucose, Bld 101 (H) 70 - 99 mg/dL    Comment: Glucose reference range applies only to samples taken after fasting for at least 8 hours.   BUN 15 8 - 23 mg/dL   Creatinine, Ser 1.04 0.61 - 1.24 mg/dL   Calcium 9.1 8.9 - 10.3 mg/dL   Total Protein 6.5 6.5 - 8.1 g/dL   Albumin 3.8 3.5 - 5.0 g/dL   AST 19 15 - 41 U/L   ALT 13 0 - 44 U/L   Alkaline Phosphatase 51 38 - 126 U/L   Total Bilirubin 0.6 0.3 - 1.2 mg/dL   GFR, Estimated >60 >60 mL/min    Comment: (NOTE) Calculated using the CKD-EPI Creatinine Equation (2021)    Anion gap 6 5 - 15    Comment: Performed at [facility], [place] [address] [place], [place] [address]  RPR     Status: None   Collection Time: 07/30/20  5:18 AM  Result Value Ref Range   RPR Ser Ql NON REACTIVE NON REACTIVE    Comment: Performed at [place] [address], [place], [place] [address]  SARS CORONAVIRUS 2 (TAT 6-24 HRS) Nasopharyngeal Nasopharyngeal Swab     Status: None   Collection Time: 07/31/20  3:43 PM   Specimen: Nasopharyngeal Swab  Result Value Ref Range   SARS Coronavirus 2 NEGATIVE NEGATIVE    Comment: (NOTE) SARS-CoV-2 target nucleic acids are NOT DETECTED.  The SARS-CoV-2 RNA is generally detectable in upper and  lower respiratory specimens during the acute phase of infection. Negative results do not preclude SARS-CoV-2 infection, do not rule out co-infections with other pathogens, and should not be used as the sole basis for treatment or other patient management decisions. Negative results must be combined with clinical observations, patient history, and epidemiological information. The expected result is Negative.  Fact Sheet for Patients: SugarRoll.be  Fact Sheet for Healthcare Providers: https://www.woods-mathews.com/  This test is not yet approved or cleared by the [place] FDA and  has been authorized for detection and/or diagnosis of SARS-CoV-2 by FDA under an Emergency Use Authorization (EUA). This EUA will remain  in effect (meaning this test can be used) for the duration of the COVID-19 declaration under Se ction 564(b)(1) of the Act, 21 U.S.C. section 360bbb-3(b)(1), unless the authorization is terminated or revoked sooner.  Performed at [facility], [place] [address] [place],  [address]   Basic metabolic panel     Status: Abnormal   Collection Time: 09/15/20 10:48 AM  Result Value Ref Range   Sodium 138 135 - 145 mmol/L   Potassium 3.8 3.5 - 5.1 mmol/L   Chloride 103 98 - 111 mmol/L   CO2 26 22 - 32 mmol/L   Glucose, Bld 114 (H) 70 - 99 mg/dL    Comment: Glucose reference range applies only to samples taken after fasting for at least 8 hours.   BUN 10 8 - 23 mg/dL   Creatinine, Ser 1.26 (H) 0.61 - 1.24 mg/dL   Calcium 9.2 8.9 - 10.3 mg/dL   GFR,  Estimated 58 (L) >60 mL/min    Comment: (NOTE) Calculated using the CKD-EPI Creatinine Equation (2021)    Anion gap 9 5 - 15    Comment: Performed at [facility], [place] [address] [place], [place] [address]  CBC     Status: Abnormal   Collection Time: 09/15/20 10:48 AM  Result Value Ref Range   WBC 8.0 4.0 - 10.5 K/uL   RBC 4.23 4.22 - 5.81 MIL/uL   Hemoglobin  13.3 13.0 - 17.0 g/dL   HCT 39.4 39.0 - 52.0 %   MCV 93.1 80.0 - 100.0 fL   MCH 31.4 26.0 - 34.0 pg   MCHC 33.8 30.0 - 36.0 g/dL   RDW 13.8 11.5 - 15.5 %   Platelets 143 (L) 150 - 400 K/uL   nRBC 0.0 0.0 - 0.2 %    Comment: Performed at [facility], [place] [address] [place], [place] [address]  Troponin I (High Sensitivity)     Status: None   Collection Time: 09/15/20 10:48 AM  Result Value Ref Range   Troponin I (High Sensitivity) 13 <18 ng/L    Comment: (NOTE) Elevated high sensitivity troponin I (hsTnI) values and significant  changes across serial measurements may suggest ACS but many other  chronic and acute conditions are known to elevate hsTnI results.  Refer to the "Links" section for chest pain algorithms and additional  guidance. Performed at [facility], [place] [address], [place], [place] [address]   Troponin I (High Sensitivity)     Status: None   Collection Time: 09/15/20 12:43 PM  Result Value Ref Range   Troponin I (High Sensitivity) 12 <18 ng/L    Comment: (NOTE) Elevated high sensitivity troponin I (hsTnI) values and significant  changes across serial measurements may suggest ACS but many other  chronic and acute conditions are known to elevate hsTnI results.  Refer to the "Links" section for chest pain algorithms and additional  guidance. Performed at [facility], [place] [address], [place], [place] [address]   Hepatic function panel     Status: Abnormal   Collection Time: 09/15/20 12:43 PM  Result Value Ref Range   Total Protein 6.7 6.5 - 8.1 g/dL   Albumin 3.9 3.5 - 5.0 g/dL   AST 18 15 - 41 U/L   ALT 16 0 - 44 U/L   Alkaline Phosphatase 72 38 - 126 U/L   Total Bilirubin 1.5 (H) 0.3 - 1.2 mg/dL   Bilirubin, Direct 0.3 (H) 0.0 - 0.2 mg/dL   Indirect Bilirubin 1.2 (H) 0.3 - 0.9 mg/dL    Comment: Performed at [place] [address], [place], [place] [address]  Lipase, blood     Status: None   Collection Time: 09/15/20 12:43 PM   Result Value Ref Range   Lipase 28 11 - 51 U/L    Comment: Performed at [facility], [place] [address] [place], [place] [address]  Urine rapid drug screen (hosp performed)     Status: Abnormal   Collection Time: 09/15/20 12:49 PM  Result Value Ref Range   Opiates NONE DETECTED NONE DETECTED   Cocaine NONE DETECTED NONE DETECTED   Benzodiazepines NONE DETECTED NONE DETECTED   Amphetamines NONE DETECTED NONE DETECTED   Tetrahydrocannabinol POSITIVE (A) NONE DETECTED   Barbiturates NONE DETECTED NONE DETECTED    Comment: (NOTE) DRUG SCREEN FOR MEDICAL PURPOSES ONLY.  IF CONFIRMATION IS NEEDED FOR ANY PURPOSE, NOTIFY LAB  WITHIN 5 DAYS.  LOWEST DETECTABLE LIMITS FOR URINE DRUG SCREEN Drug Class                     Cutoff (ng/mL) Amphetamine and metabolites    1000 Barbiturate and metabolites    200 Benzodiazepine                 166 Tricyclics and metabolites     300 Opiates and metabolites        300 Cocaine and metabolites        300 THC                            50 Performed at [facility], [place] [address] [place], [place] [address]     Lipid Panel    Component Value Date/Time   CHOL 190 10/15/2017 1119   TRIG 136 10/15/2017 1119   HDL 40 10/15/2017 1119   CHOLHDL 4.8 10/15/2017 1119   CHOLHDL 3.9 05/05/2013 0520   VLDL 27 05/05/2013 0520   LDLCALC 123 (H) 10/15/2017 1119   10/13/2019 Cholesterol 274, HDL 35, LDL 186, triglycerides 267  ASSESSMENT:    1. SSS (sick sinus syndrome) ([place])   2. Second degree AV block   3. Pacemaker dual chamber Medtronic 2007, new generator 06/11/13   4. PAT (paroxysmal atrial tachycardia) (H[facility])   5. Other forms of angina pectoris ([place])   6. Bilateral renal artery stenosis (H[facility])   7. Essential hypertension   8. Orthostatic hypotension   9. Mixed hyperlipidemia   10. Ascending aortic aneurysm ([place])   11. AAA (abdominal aortic aneurysm) without rupture ([place])   12. Anxiety and depression      PLAN:  In order of  problems listed above:  SSS: Good heart rate histogram distribution with virtually 100% atrial paced, ventricular sensed rhythm. 2nd degree AV block: Rarely needs ventricular pacing. PPM: Normal device function.  Remote pacemaker downloads "make him nervous" and he prefers to be seen in the office every 6 months.  He is not pacemaker dependent. PAT: Frequent very brief episodes of atrial tachycardia.  He has never had true atrial fibrillation.  Atrial tachycardia tends to increase if he forgets to take his beta-blocker. CAD: Recent chest pain leading to emergency room visit, but without any acute changes on ECG or enzyme leak.  We will schedule him for nuclear perfusion study.  His ECG prominent repolarization changes present for years are likely due to an underlying cardiomyopathy rather than acute ischemia. 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 HTN: Adequate control.  Attempts at aggressive blood pressure management have led to orthostatic hypotension and syncope.  Continue atenolol and avoid missing doses. Orthostatic hypotension: Recently he has not had syncope or falls. HLP: On atorvastatin.  Labs are followed at the [facility] hospital.  Target LDL less than 70. Asc Ao aneurysm/AAA: Most recent CT angiogram performed in April showed an ascending aorta aneurysm of 4.4 cm and a AAA of 4.0 cm.  Continue to monitor on a yearly basis. Depression and anxiety are interfering with ability to treat his serious medical conditions. He has frequent flashbacks and PTSD related to his stint in [place].  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  He is currently taking venlafaxine and trazodone at bedtime.  Seems reasonably well compensated.  He had a very tough time when he had to put down his elderly [place]  shepherd, but he has a new puppy.  Most recently, his wife was diagnosed with esophageal cancer and this has placed new strain on his psychological status..    Medication Adjustments/Labs and Tests Ordered: Current medicines are reviewed at length with the patient today.  Concerns regarding medicines are outlined above.  Medication changes, Labs and Tests ordered today are listed in the Patient Instructions below. Patient Instructions  Medication Instructions:  No changes *If you need a refill on your cardiac medications before your next appointment, please call your pharmacy*   Lab Work: None ordered If you have labs (blood work) drawn today and your tests are completely normal, you will receive your results only by: [place] (if you have MyChart) OR A paper copy in the mail If you have any lab test that is abnormal or we need to change your treatment, we will call you to review the results.   Testing/Procedures: Your physician has requested that you have a lexiscan myoview. For further information please visit [email or web address]. Please follow instruction sheet, as given. This will take place at [place], [address]  How to prepare for your Myocardial Perfusion Test: Do not eat or drink 3 hours prior to your test, except you may have water. Do not consume products containing caffeine (regular or decaffeinated) 12 hours prior to your test. (ex: coffee, chocolate, sodas, tea). Do bring a list of your current medications with you.  If not listed below, you may take your medications as normal. Do wear comfortable clothes (no dresses or overalls) and walking shoes, tennis shoes preferred (No heels or open toe shoes are allowed). Do NOT wear cologne, perfume, aftershave, or lotions (deodorant is allowed). The test will take approximately 3 to 4 hours to complete If these instructions are not followed, your test will have to be rescheduled.   Follow-Up: At [facility], you and your health needs are our priority.  As part of our continuing mission to provide you with exceptional heart care, we have created designated Provider Care Teams.  These Care Teams include your primary Cardiologist (physician) and Advanced Practice Providers (APPs -  Physician Assistants and Nurse Practitioners) who all work together to provide you with the care you need, when you need it.  We recommend signing up for the patient portal called "MyChart".  Sign up information is provided on this After Visit Summary.  MyChart is used to connect with patients for Virtual Visits (Telemedicine).  Patients are able to view lab/test results, encounter notes, upcoming appointments, etc.  Non-urgent messages can be sent to your provider as well.   To learn more about what you can do with MyChart, go to [email or web address].    Your next appointment:   12 month(s)  The format for your next appointment:   In Person  Provider:   You may see Sanda Klein, MD or one of the following Advanced Practice Providers on your designated Care Team:   Almyra Deforest, PA-C Fabian Sharp, [place] or  Roby Lofts, PA-C       Signed, Sanda Klein, MD  09/24/2020 8:20 PM    [place], [place], [place]  [address] Phone: [phone]; Fax: [phone]

## 2020-09-21 NOTE — Patient Instructions (Signed)
Medication Instructions:  No changes *If you need a refill on your cardiac medications before your next appointment, please call your pharmacy*   Lab Work: None ordered If you have labs (blood work) drawn today and your tests are completely normal, you will receive your results only by: [place] (if you have MyChart) OR A paper copy in the mail If you have any lab test that is abnormal or we need to change your treatment, we will call you to review the results.   Testing/Procedures: Your physician has requested that you have a lexiscan myoview. For further information please visit [email or web address]. Please follow instruction sheet, as given. This will take place at [place], [address]  How to prepare for your Myocardial Perfusion Test: Do not eat or drink 3 hours prior to your test, except you may have water. Do not consume products containing caffeine (regular or decaffeinated) 12 hours prior to your test. (ex: coffee, chocolate, sodas, tea). Do bring a list of your current medications with you.  If not listed below, you may take your medications as normal. Do wear comfortable clothes (no dresses or overalls) and walking shoes, tennis shoes preferred (No heels or open toe shoes are allowed). Do NOT wear cologne, perfume, aftershave, or lotions (deodorant is allowed). The test will take approximately 3 to 4 hours to complete If these instructions are not followed, your test will have to be rescheduled.   Follow-Up: At [facility], you and your health needs are our priority.  As part of our continuing mission to provide you with exceptional heart care, we have created designated Provider Care Teams.  These Care Teams include your primary Cardiologist (physician) and Advanced Practice Providers (APPs -  Physician Assistants and Nurse Practitioners) who all work together to provide you with the care you need, when you need it.  We recommend signing up for the patient  portal called "MyChart".  Sign up information is provided on this After Visit Summary.  MyChart is used to connect with patients for Virtual Visits (Telemedicine).  Patients are able to view lab/test results, encounter notes, upcoming appointments, etc.  Non-urgent messages can be sent to your provider as well.   To learn more about what you can do with MyChart, go to [email or web address].    Your next appointment:   12 month(s)  The format for your next appointment:   In Person  Provider:   You may see Sanda Klein, MD or one of the following Advanced Practice Providers on your designated Care Team:   Almyra Deforest, PA-C Fabian Sharp, PA-C or  Roby Lofts, [place]

## 2020-09-26 ENCOUNTER — Encounter (HOSPITAL_COMMUNITY): Payer: Self-pay | Admitting: Cardiovascular Disease

## 2020-10-10 ENCOUNTER — Telehealth (HOSPITAL_COMMUNITY): Payer: Self-pay | Admitting: Cardiovascular Disease

## 2020-10-10 NOTE — Telephone Encounter (Signed)
Just an FYI. We have made several attempts to contact this patient including sending a letter to schedule or reschedule their Myocardial Perfusion. We will be removing the patient from the echo/Nuc WQ.   MAILED LETTER 09/27/20 LBW  09/26/20 Called and VM was full @ 3:24/LBW  09/22/20 called HM# and Mobile# -left message on cell due to HM# was full/LBW 9:06  09/21/20 called and VMis full @ 3:42/LBW      Thank you

## 2020-11-08 DIAGNOSIS — I10 Essential (primary) hypertension: Secondary | ICD-10-CM | POA: Diagnosis not present

## 2020-11-08 DIAGNOSIS — Z125 Encounter for screening for malignant neoplasm of prostate: Secondary | ICD-10-CM | POA: Diagnosis not present

## 2020-11-08 DIAGNOSIS — E785 Hyperlipidemia, unspecified: Secondary | ICD-10-CM | POA: Diagnosis not present

## 2021-01-10 DIAGNOSIS — R413 Other amnesia: Secondary | ICD-10-CM | POA: Diagnosis not present

## 2021-01-10 DIAGNOSIS — I700 Atherosclerosis of aorta: Secondary | ICD-10-CM | POA: Diagnosis not present

## 2021-01-10 DIAGNOSIS — Z0000 Encounter for general adult medical examination without abnormal findings: Secondary | ICD-10-CM | POA: Diagnosis not present

## 2021-01-10 DIAGNOSIS — E785 Hyperlipidemia, unspecified: Secondary | ICD-10-CM | POA: Diagnosis not present

## 2021-01-10 DIAGNOSIS — R2689 Other abnormalities of gait and mobility: Secondary | ICD-10-CM | POA: Diagnosis not present

## 2021-01-10 DIAGNOSIS — M4306 Spondylolysis, lumbar region: Secondary | ICD-10-CM | POA: Diagnosis not present

## 2021-01-10 DIAGNOSIS — Z1331 Encounter for screening for depression: Secondary | ICD-10-CM | POA: Diagnosis not present

## 2021-01-10 DIAGNOSIS — Z1339 Encounter for screening examination for other mental health and behavioral disorders: Secondary | ICD-10-CM | POA: Diagnosis not present

## 2021-01-10 DIAGNOSIS — Z950 Presence of cardiac pacemaker: Secondary | ICD-10-CM | POA: Diagnosis not present

## 2021-01-10 DIAGNOSIS — I10 Essential (primary) hypertension: Secondary | ICD-10-CM | POA: Diagnosis not present

## 2021-04-23 ENCOUNTER — Other Ambulatory Visit: Payer: Self-pay | Admitting: Cardiovascular Disease

## 2021-04-23 DIAGNOSIS — E782 Mixed hyperlipidemia: Secondary | ICD-10-CM

## 2021-05-14 DIAGNOSIS — I10 Essential (primary) hypertension: Secondary | ICD-10-CM | POA: Diagnosis not present

## 2021-05-14 DIAGNOSIS — F4310 Post-traumatic stress disorder, unspecified: Secondary | ICD-10-CM | POA: Diagnosis not present

## 2021-05-14 DIAGNOSIS — R059 Cough, unspecified: Secondary | ICD-10-CM | POA: Diagnosis not present

## 2021-05-14 DIAGNOSIS — R2689 Other abnormalities of gait and mobility: Secondary | ICD-10-CM | POA: Diagnosis not present

## 2021-05-14 DIAGNOSIS — I700 Atherosclerosis of aorta: Secondary | ICD-10-CM | POA: Diagnosis not present

## 2021-06-01 ENCOUNTER — Encounter (HOSPITAL_COMMUNITY): Payer: Self-pay | Admitting: Radiology

## 2021-09-02 ENCOUNTER — Encounter (HOSPITAL_COMMUNITY): Payer: Self-pay

## 2021-09-02 ENCOUNTER — Inpatient Hospital Stay (HOSPITAL_COMMUNITY)
Admission: EM | Admit: 2021-09-02 | Discharge: 2021-09-06 | DRG: 690 | Disposition: A | Discharge status: Home Health | Payer: Medicare Other | Attending: Family Medicine | Admitting: Family Medicine

## 2021-09-02 ENCOUNTER — Other Ambulatory Visit: Payer: Self-pay

## 2021-09-02 DIAGNOSIS — I739 Peripheral vascular disease, unspecified: Secondary | ICD-10-CM | POA: Diagnosis present

## 2021-09-02 DIAGNOSIS — B9620 Unspecified Escherichia coli [E. coli] as the cause of diseases classified elsewhere: Secondary | ICD-10-CM | POA: Diagnosis present

## 2021-09-02 DIAGNOSIS — Z8249 Family history of ischemic heart disease and other diseases of the circulatory system: Secondary | ICD-10-CM

## 2021-09-02 DIAGNOSIS — G4730 Sleep apnea, unspecified: Secondary | ICD-10-CM | POA: Diagnosis present

## 2021-09-02 DIAGNOSIS — I2510 Atherosclerotic heart disease of native coronary artery without angina pectoris: Secondary | ICD-10-CM | POA: Diagnosis not present

## 2021-09-02 DIAGNOSIS — Z87891 Personal history of nicotine dependence: Secondary | ICD-10-CM | POA: Diagnosis not present

## 2021-09-02 DIAGNOSIS — I495 Sick sinus syndrome: Secondary | ICD-10-CM | POA: Diagnosis present

## 2021-09-02 DIAGNOSIS — F419 Anxiety disorder, unspecified: Secondary | ICD-10-CM | POA: Diagnosis not present

## 2021-09-02 DIAGNOSIS — E782 Mixed hyperlipidemia: Secondary | ICD-10-CM | POA: Diagnosis present

## 2021-09-02 DIAGNOSIS — R509 Fever, unspecified: Secondary | ICD-10-CM | POA: Diagnosis not present

## 2021-09-02 DIAGNOSIS — Z961 Presence of intraocular lens: Secondary | ICD-10-CM | POA: Insufficient documentation

## 2021-09-02 DIAGNOSIS — G8929 Other chronic pain: Secondary | ICD-10-CM | POA: Diagnosis present

## 2021-09-02 DIAGNOSIS — Z955 Presence of coronary angioplasty implant and graft: Secondary | ICD-10-CM | POA: Diagnosis not present

## 2021-09-02 DIAGNOSIS — N400 Enlarged prostate without lower urinary tract symptoms: Secondary | ICD-10-CM | POA: Diagnosis present

## 2021-09-02 DIAGNOSIS — I7121 Aneurysm of the ascending aorta, without rupture: Secondary | ICD-10-CM | POA: Diagnosis present

## 2021-09-02 DIAGNOSIS — E538 Deficiency of other specified B group vitamins: Secondary | ICD-10-CM | POA: Diagnosis not present

## 2021-09-02 DIAGNOSIS — K5900 Constipation, unspecified: Secondary | ICD-10-CM | POA: Diagnosis present

## 2021-09-02 DIAGNOSIS — Z950 Presence of cardiac pacemaker: Secondary | ICD-10-CM

## 2021-09-02 DIAGNOSIS — H906 Mixed conductive and sensorineural hearing loss, bilateral: Secondary | ICD-10-CM | POA: Insufficient documentation

## 2021-09-02 DIAGNOSIS — K219 Gastro-esophageal reflux disease without esophagitis: Secondary | ICD-10-CM | POA: Diagnosis present

## 2021-09-02 DIAGNOSIS — R531 Weakness: Secondary | ICD-10-CM

## 2021-09-02 DIAGNOSIS — I480 Paroxysmal atrial fibrillation: Secondary | ICD-10-CM | POA: Diagnosis present

## 2021-09-02 DIAGNOSIS — F4310 Post-traumatic stress disorder, unspecified: Secondary | ICD-10-CM | POA: Diagnosis present

## 2021-09-02 DIAGNOSIS — Z888 Allergy status to other drugs, medicaments and biological substances status: Secondary | ICD-10-CM

## 2021-09-02 DIAGNOSIS — R519 Headache, unspecified: Secondary | ICD-10-CM | POA: Diagnosis present

## 2021-09-02 DIAGNOSIS — F32A Depression, unspecified: Secondary | ICD-10-CM | POA: Diagnosis present

## 2021-09-02 DIAGNOSIS — F40240 Claustrophobia: Secondary | ICD-10-CM | POA: Diagnosis present

## 2021-09-02 DIAGNOSIS — R451 Restlessness and agitation: Secondary | ICD-10-CM | POA: Diagnosis present

## 2021-09-02 DIAGNOSIS — Z7982 Long term (current) use of aspirin: Secondary | ICD-10-CM

## 2021-09-02 DIAGNOSIS — Z79899 Other long term (current) drug therapy: Secondary | ICD-10-CM

## 2021-09-02 DIAGNOSIS — I10 Essential (primary) hypertension: Secondary | ICD-10-CM | POA: Diagnosis present

## 2021-09-02 DIAGNOSIS — N390 Urinary tract infection, site not specified: Principal | ICD-10-CM | POA: Diagnosis present

## 2021-09-02 DIAGNOSIS — H9319 Tinnitus, unspecified ear: Secondary | ICD-10-CM | POA: Insufficient documentation

## 2021-09-02 DIAGNOSIS — Z20822 Contact with and (suspected) exposure to covid-19: Secondary | ICD-10-CM | POA: Diagnosis present

## 2021-09-02 DIAGNOSIS — K5090 Crohn's disease, unspecified, without complications: Secondary | ICD-10-CM | POA: Diagnosis present

## 2021-09-02 DIAGNOSIS — D649 Anemia, unspecified: Secondary | ICD-10-CM | POA: Diagnosis present

## 2021-09-02 DIAGNOSIS — I701 Atherosclerosis of renal artery: Secondary | ICD-10-CM | POA: Diagnosis present

## 2021-09-02 LAB — URINALYSIS, ROUTINE W REFLEX MICROSCOPIC
Bilirubin Urine: NEGATIVE
Glucose, UA: NEGATIVE mg/dL
Hgb urine dipstick: NEGATIVE
Ketones, ur: NEGATIVE mg/dL
Nitrite: POSITIVE — AB
Protein, ur: NEGATIVE mg/dL
Specific Gravity, Urine: 1.024 (ref 1.005–1.030)
pH: 5 (ref 5.0–8.0)

## 2021-09-02 LAB — CBC WITH DIFFERENTIAL/PLATELET
Abs Immature Granulocytes: 0.06 10*3/uL (ref 0.00–0.07)
Basophils Absolute: 0 10*3/uL (ref 0.0–0.1)
Basophils Relative: 0 %
Eosinophils Absolute: 0 10*3/uL (ref 0.0–0.5)
Eosinophils Relative: 0 %
HCT: 37.3 % — ABNORMAL LOW (ref 39.0–52.0)
Hemoglobin: 12.7 g/dL — ABNORMAL LOW (ref 13.0–17.0)
Immature Granulocytes: 0 %
Lymphocytes Relative: 7 %
Lymphs Abs: 1.1 10*3/uL (ref 0.7–4.0)
MCH: 32.3 pg (ref 26.0–34.0)
MCHC: 34 g/dL (ref 30.0–36.0)
MCV: 94.9 fL (ref 80.0–100.0)
Monocytes Absolute: 1.2 10*3/uL — ABNORMAL HIGH (ref 0.1–1.0)
Monocytes Relative: 8 %
Neutro Abs: 13.1 10*3/uL — ABNORMAL HIGH (ref 1.7–7.7)
Neutrophils Relative %: 85 %
Platelets: 145 10*3/uL — ABNORMAL LOW (ref 150–400)
RBC: 3.93 MIL/uL — ABNORMAL LOW (ref 4.22–5.81)
RDW: 13.1 % (ref 11.5–15.5)
WBC: 15.4 10*3/uL — ABNORMAL HIGH (ref 4.0–10.5)
nRBC: 0 % (ref 0.0–0.2)

## 2021-09-02 LAB — COMPREHENSIVE METABOLIC PANEL
ALT: 17 U/L (ref 0–44)
AST: 18 U/L (ref 15–41)
Albumin: 3.9 g/dL (ref 3.5–5.0)
Alkaline Phosphatase: 60 U/L (ref 38–126)
Anion gap: 8 (ref 5–15)
BUN: 25 mg/dL — ABNORMAL HIGH (ref 8–23)
CO2: 26 mmol/L (ref 22–32)
Calcium: 9.3 mg/dL (ref 8.9–10.3)
Chloride: 106 mmol/L (ref 98–111)
Creatinine, Ser: 1.09 mg/dL (ref 0.61–1.24)
GFR, Estimated: >60 mL/min (ref >60)
Glucose, Bld: 127 mg/dL — ABNORMAL HIGH (ref 70–99)
Potassium: 4.3 mmol/L (ref 3.5–5.1)
Sodium: 140 mmol/L (ref 135–145)
Total Bilirubin: 0.5 mg/dL (ref 0.3–1.2)
Total Protein: 6.5 g/dL (ref 6.5–8.1)

## 2021-09-02 LAB — TROPONIN I (HIGH SENSITIVITY): Troponin I (High Sensitivity): 9 ng/L (ref <18)

## 2021-09-02 LAB — SARS CORONAVIRUS 2 BY RT PCR: SARS Coronavirus 2 by RT PCR: NEGATIVE

## 2021-09-02 MED ORDER — SODIUM CHLORIDE 0.9 % IV SOLN
1.0000 g | Freq: Once | INTRAVENOUS | Status: AC
  Administered 2021-09-02: 1 g via INTRAVENOUS
  Filled 2021-09-02: qty 10

## 2021-09-02 MED ORDER — TRAZODONE HCL 100 MG PO TABS
100.0000 mg | ORAL_TABLET | Freq: Every day | ORAL | Status: DC
  Administered 2021-09-02 – 2021-09-05 (×4): 100 mg via ORAL
  Filled 2021-09-02 (×4): qty 1

## 2021-09-02 MED ORDER — MAGNESIUM HYDROXIDE 400 MG/5ML PO SUSP
30.0000 mL | Freq: Every day | ORAL | Status: DC | PRN
Start: 2021-09-02 — End: 2021-09-06
  Administered 2021-09-04: 30 mL via ORAL
  Filled 2021-09-02: qty 30

## 2021-09-02 MED ORDER — SODIUM CHLORIDE 0.9 % IV SOLN
1.0000 g | INTRAVENOUS | Status: DC
  Administered 2021-09-03 – 2021-09-05 (×3): 1 g via INTRAVENOUS
  Filled 2021-09-02 (×3): qty 10

## 2021-09-02 MED ORDER — ENOXAPARIN SODIUM 40 MG/0.4ML IJ SOSY
40.0000 mg | PREFILLED_SYRINGE | Freq: Every day | INTRAMUSCULAR | Status: DC
  Administered 2021-09-02: 40 mg via SUBCUTANEOUS
  Filled 2021-09-02: qty 0.4

## 2021-09-02 MED ORDER — VITAMIN B-12 100 MCG PO TABS
100.0000 ug | ORAL_TABLET | Freq: Every day | ORAL | Status: DC
  Administered 2021-09-02 – 2021-09-06 (×5): 100 ug via ORAL
  Filled 2021-09-02 (×6): qty 1

## 2021-09-02 MED ORDER — ZOLPIDEM TARTRATE 5 MG PO TABS
5.0000 mg | ORAL_TABLET | Freq: Every evening | ORAL | Status: DC | PRN
  Administered 2021-09-03 – 2021-09-05 (×3): 5 mg via ORAL
  Filled 2021-09-02 (×3): qty 1

## 2021-09-02 MED ORDER — SENNOSIDES-DOCUSATE SODIUM 8.6-50 MG PO TABS
1.0000 | ORAL_TABLET | Freq: Two times a day (BID) | ORAL | Status: DC
  Administered 2021-09-02 – 2021-09-06 (×8): 1 via ORAL
  Filled 2021-09-02 (×8): qty 1

## 2021-09-02 MED ORDER — ENSURE ENLIVE PO LIQD
237.0000 mL | Freq: Two times a day (BID) | ORAL | Status: DC
  Administered 2021-09-02: 237 mL via ORAL

## 2021-09-02 MED ORDER — ASPIRIN 81 MG PO TBEC
81.0000 mg | DELAYED_RELEASE_TABLET | Freq: Every day | ORAL | Status: DC
  Administered 2021-09-02 – 2021-09-06 (×5): 81 mg via ORAL
  Filled 2021-09-02 (×5): qty 1

## 2021-09-02 MED ORDER — ONDANSETRON HCL 4 MG/2ML IJ SOLN
4.0000 mg | Freq: Four times a day (QID) | INTRAMUSCULAR | Status: DC | PRN
  Administered 2021-09-03: 4 mg via INTRAVENOUS
  Filled 2021-09-02: qty 2

## 2021-09-02 MED ORDER — ACETAMINOPHEN 325 MG PO TABS
650.0000 mg | ORAL_TABLET | Freq: Four times a day (QID) | ORAL | Status: DC | PRN
  Administered 2021-09-05: 650 mg via ORAL
  Filled 2021-09-02: qty 2

## 2021-09-02 MED ORDER — KETOROLAC TROMETHAMINE 15 MG/ML IJ SOLN: 15.0000 mg | Freq: Once | INTRAMUSCULAR | Status: DC

## 2021-09-02 MED ORDER — QUETIAPINE FUMARATE 25 MG PO TABS
25.0000 mg | ORAL_TABLET | Freq: Every day | ORAL | Status: DC
  Administered 2021-09-02 – 2021-09-05 (×3): 25 mg via ORAL
  Filled 2021-09-02 (×4): qty 1

## 2021-09-02 MED ORDER — ATENOLOL 50 MG PO TABS
50.0000 mg | ORAL_TABLET | Freq: Two times a day (BID) | ORAL | Status: DC
  Administered 2021-09-02 – 2021-09-06 (×8): 50 mg via ORAL
  Filled 2021-09-02 (×9): qty 1

## 2021-09-02 MED ORDER — ACETAMINOPHEN 650 MG RE SUPP: 650.0000 mg | Freq: Four times a day (QID) | RECTAL | Status: DC | PRN

## 2021-09-02 MED ORDER — TAMSULOSIN HCL 0.4 MG PO CAPS
0.4000 mg | ORAL_CAPSULE | Freq: Every day | ORAL | Status: DC
  Administered 2021-09-02 – 2021-09-05 (×4): 0.4 mg via ORAL
  Filled 2021-09-02 (×4): qty 1

## 2021-09-02 MED ORDER — SODIUM CHLORIDE 0.45 % IV SOLN: INTRAVENOUS | Status: AC

## 2021-09-02 MED ORDER — ONDANSETRON HCL 4 MG PO TABS: 4.0000 mg | ORAL_TABLET | Freq: Four times a day (QID) | ORAL | Status: DC | PRN

## 2021-09-02 MED ORDER — PANTOPRAZOLE SODIUM 40 MG PO TBEC
40.0000 mg | DELAYED_RELEASE_TABLET | Freq: Every day | ORAL | Status: DC
  Administered 2021-09-02 – 2021-09-06 (×5): 40 mg via ORAL
  Filled 2021-09-02 (×5): qty 1

## 2021-09-02 MED ORDER — NITROGLYCERIN 0.4 MG SL SUBL: 0.4000 mg | SUBLINGUAL_TABLET | SUBLINGUAL | Status: DC | PRN

## 2021-09-02 MED ORDER — KETOROLAC TROMETHAMINE 15 MG/ML IJ SOLN
15.0000 mg | Freq: Once | INTRAMUSCULAR | Status: AC
  Administered 2021-09-02: 15 mg via INTRAVENOUS
  Filled 2021-09-02: qty 1

## 2021-09-02 MED ORDER — ATORVASTATIN CALCIUM 40 MG PO TABS
80.0000 mg | ORAL_TABLET | Freq: Every day | ORAL | Status: DC
  Administered 2021-09-02 – 2021-09-05 (×4): 80 mg via ORAL
  Filled 2021-09-02 (×4): qty 2
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
## 2021-09-02 NOTE — Progress Notes (Signed)
Attempted to call son again. Not answering his phone at this time

## 2021-09-02 NOTE — ED Notes (Signed)
Attempt made to contact the son.

## 2021-09-02 NOTE — ED Notes (Signed)
Unable to provide urine specimen at this time.

## 2021-09-02 NOTE — ED Provider Notes (Signed)
[facility] DEPT Provider Note   CSN: [id number] Arrival date & time: 09/02/21  0347     History  Chief Complaint  Patient presents with   Weakness    Kevin Wiley is a 81 y.o. male history of coronary disease with PCI to the LAD in 1999, small ascending aortic aneurysm, hypertension, depression, PTSD, slightly emerged apartment with complaint of generalized weakness.  Patient reports that "I cannot walk" because his legs were too weak, and that this worsened yesterday.  He denies any chest pressure.  He denies shortness of breath.  He denies headache.  He says he just has no energy.  Typically he is walking with a cane at home.  He also helps take care of his wife who is essentially bedbound, per his description.  He has not been able to reach his son  HPI     Home Medications Prior to Admission medications   Medication Sig Start Date End Date Taking? Authorizing Provider  atenolol (TENORMIN) 100 MG tablet Take 100 mg by mouth daily.   Yes [provider]  oxybutynin (DITROPAN-XL) 5 MG 24 hr tablet Take 5 mg by mouth daily. 07/04/21  Yes [provider]  tamsulosin (FLOMAX) 0.4 MG CAPS capsule Take 1 capsule (0.4 mg total) by mouth at bedtime. 03/16/18  Yes Hennie Duos, MD  traZODone (DESYREL) 100 MG tablet Take 1 tablet (100 mg total) by mouth at bedtime. Patient taking differently: Take 200 mg by mouth at bedtime. 03/16/18  Yes Hennie Duos, MD  zolpidem (AMBIEN) 10 MG tablet Take 10 mg by mouth at bedtime.   Yes [provider]  aspirin EC 81 MG tablet Take 1 tablet (81 mg total) by mouth daily. Swallow whole. 04/17/20   Croitoru, Mihai, MD  atorvastatin (LIPITOR) 80 MG tablet TAKE 1 TABLET(80 MG) BY MOUTH AT BEDTIME 04/24/21   Croitoru, Mihai, MD  DULoxetine (CYMBALTA) 20 MG capsule Take 40 mg by mouth daily.    [provider]  gabapentin (NEURONTIN) 800 MG tablet Take 1 tablet (800 mg total) by mouth 3  (three) times daily. 03/16/18   Hennie Duos, MD  hydrALAZINE (APRESOLINE) 50 MG tablet Take 1 tablet (50 mg total) by mouth 3 (three) times daily. 03/16/18   Hennie Duos, MD  isosorbide mononitrate (IMDUR) 30 MG 24 hr tablet Take 1 tablet (30 mg total) by mouth daily. 09/15/20   Blanchie Dessert, MD  methocarbamol (ROBAXIN) 500 MG tablet Take 1 tablet (500 mg total) by mouth every 8 (eight) hours as needed for muscle spasms. 08/01/20   Darliss Cheney, MD  nitroGLYCERIN (NITROSTAT) 0.4 MG SL tablet Place 1 tablet (0.4 mg total) under the tongue every 5 (five) minutes as needed for chest pain (times 3 times then call MD). 03/16/18   Hennie Duos, MD  omeprazole (PRILOSEC) 20 MG capsule Take 1 capsule (20 mg total) by mouth daily. 09/15/20   Blanchie Dessert, MD  QUEtiapine (SEROQUEL) 25 MG tablet Take 25 mg by mouth at bedtime.    [provider]  vitamin B-12 (CYANOCOBALAMIN) 100 MCG tablet Take 100 mcg by mouth daily.     [provider]      Allergies    Amlodipine besy-benazepril hcl and Hytrin [terazosin]    Review of Systems   Review of Systems  Physical Exam Updated Vital Signs BP 127/85 (BP Location: Left Arm)   Pulse 61   Temp 99 F (37.2 C) (Oral)  Resp 18   Ht 5' 9"  (1.753 m)   Wt 70.3 kg   SpO2 97%   BMI 22.89 kg/m  Physical Exam Constitutional:      General: He is not in acute distress. HENT:     Head: Normocephalic and atraumatic.  Eyes:     Conjunctiva/sclera: Conjunctivae normal.     Pupils: Pupils are equal, round, and reactive to light.  Cardiovascular:     Rate and Rhythm: Normal rate and regular rhythm.  Pulmonary:     Effort: Pulmonary effort is normal. No respiratory distress.  Abdominal:     General: There is no distension.     Tenderness: There is no abdominal tenderness.  Skin:    General: Skin is warm and dry.  Neurological:     General: No focal deficit present.     Mental Status: He is alert. Mental status is at  baseline.  Psychiatric:        Mood and Affect: Mood normal.        Behavior: Behavior normal.     ED Results / Procedures / Treatments   Labs (all labs ordered are listed, but only abnormal results are displayed) Labs Reviewed  CBC WITH DIFFERENTIAL/PLATELET - Abnormal; Notable for the following components:      Result Value   WBC 15.4 (*)    RBC 3.93 (*)    Hemoglobin 12.7 (*)    HCT 37.3 (*)    Platelets 145 (*)    Neutro Abs 13.1 (*)    Monocytes Absolute 1.2 (*)    All other components within normal limits  COMPREHENSIVE METABOLIC PANEL - Abnormal; Notable for the following components:   Glucose, Bld 127 (*)    BUN 25 (*)    All other components within normal limits  URINALYSIS, ROUTINE W REFLEX MICROSCOPIC - Abnormal; Notable for the following components:   APPearance HAZY (*)    Nitrite POSITIVE (*)    Leukocytes,Ua SMALL (*)    Bacteria, UA FEW (*)    All other components within normal limits  SARS CORONAVIRUS 2 BY RT PCR  URINE CULTURE  TROPONIN I (HIGH SENSITIVITY)    EKG None  Radiology No results found.  Procedures Procedures    Medications Ordered in ED Medications  ondansetron (ZOFRAN) tablet 4 mg (has no administration in time range)    Or  ondansetron (ZOFRAN) injection 4 mg (has no administration in time range)  acetaminophen (TYLENOL) tablet 650 mg (has no administration in time range)    Or  acetaminophen (TYLENOL) suppository 650 mg (has no administration in time range)  0.45 % sodium chloride infusion ( Intravenous New Bag/Given 09/02/21 1053)  cefTRIAXone (ROCEPHIN) 1 g in sodium chloride 0.9 % 100 mL IVPB (has no administration in time range)  zolpidem (AMBIEN) tablet 5 mg (has no administration in time range)  vitamin B-12 (CYANOCOBALAMIN) tablet 100 mcg (has no administration in time range)  traZODone (DESYREL) tablet 100 mg (has no administration in time range)  tamsulosin (FLOMAX) capsule 0.4 mg (has no administration in time range)   QUEtiapine (SEROQUEL) tablet 25 mg (has no administration in time range)  pantoprazole (PROTONIX) EC tablet 40 mg (has no administration in time range)  nitroGLYCERIN (NITROSTAT) SL tablet 0.4 mg (has no administration in time range)  aspirin EC tablet 81 mg (has no administration in time range)  atenolol (TENORMIN) tablet 50 mg (has no administration in time range)  cefTRIAXone (ROCEPHIN) 1 g in sodium chloride 0.9 % 100  mL IVPB (0 g Intravenous Stopped 09/02/21 1021)  ketorolac (TORADOL) 15 MG/ML injection 15 mg (15 mg Intravenous Given 09/02/21 1209)    ED Course/ Medical Decision Making/ A&P                           Medical Decision Making Amount and/or Complexity of Data Reviewed Labs: ordered.  Risk Decision regarding hospitalization.   This patient presents to the ED with concern for generalized weakness. This involves an extensive number of treatment options, and is a complaint that carries with it a high risk of complications and morbidity.  The differential diagnosis includes infection versus anemia versus arrhythmia versus other  Co-morbidities that complicate the patient evaluation: History of coronary disease  External records from outside source obtained and reviewed including cardiac hx as noted above  I ordered and personally interpreted labs.  The pertinent results include:  WBC 15.4, CMP unremarkable, UA with positive nitrites and leuks consistent with UTI.  Covid negative  The patient was maintained on a cardiac monitor.  I personally viewed and interpreted the cardiac monitored which showed an underlying rhythm of: regular HR  I ordered medication including rocephin for UTI I have reviewed the patients home medicines and have made adjustments as needed  Test Considered: doubt cva clinically  After the interventions noted above, I reevaluated the patient and found that they have: stayed the same   Dispostion:  After consideration of the diagnostic results  and the patients response to treatment, I feel that the patent would benefit from admission.         Final Clinical Impression(s) / ED Diagnoses Final diagnoses:  Weakness  Urinary tract infection without hematuria, site unspecified    Rx / DC Orders ED Discharge Orders     None         Wyvonnia Dusky, MD 09/02/21 1356

## 2021-09-02 NOTE — ED Triage Notes (Signed)
Arrives EMS from home.   Usually ambulatory with use of a walker. Generalized weakness beginning yesterday worst in both lower extremities where he is unable to get up from recliner to use walker.

## 2021-09-03 DIAGNOSIS — I10 Essential (primary) hypertension: Secondary | ICD-10-CM | POA: Diagnosis present

## 2021-09-03 DIAGNOSIS — Z79899 Other long term (current) drug therapy: Secondary | ICD-10-CM | POA: Diagnosis not present

## 2021-09-03 DIAGNOSIS — F419 Anxiety disorder, unspecified: Secondary | ICD-10-CM | POA: Diagnosis not present

## 2021-09-03 DIAGNOSIS — K5090 Crohn's disease, unspecified, without complications: Secondary | ICD-10-CM | POA: Diagnosis present

## 2021-09-03 DIAGNOSIS — I739 Peripheral vascular disease, unspecified: Secondary | ICD-10-CM | POA: Diagnosis present

## 2021-09-03 DIAGNOSIS — I495 Sick sinus syndrome: Secondary | ICD-10-CM | POA: Diagnosis present

## 2021-09-03 DIAGNOSIS — I480 Paroxysmal atrial fibrillation: Secondary | ICD-10-CM | POA: Diagnosis present

## 2021-09-03 DIAGNOSIS — N390 Urinary tract infection, site not specified: Secondary | ICD-10-CM | POA: Diagnosis present

## 2021-09-03 DIAGNOSIS — G4730 Sleep apnea, unspecified: Secondary | ICD-10-CM | POA: Diagnosis present

## 2021-09-03 DIAGNOSIS — I7121 Aneurysm of the ascending aorta, without rupture: Secondary | ICD-10-CM | POA: Diagnosis present

## 2021-09-03 DIAGNOSIS — D649 Anemia, unspecified: Secondary | ICD-10-CM | POA: Diagnosis present

## 2021-09-03 DIAGNOSIS — I2510 Atherosclerotic heart disease of native coronary artery without angina pectoris: Secondary | ICD-10-CM | POA: Diagnosis present

## 2021-09-03 DIAGNOSIS — R531 Weakness: Secondary | ICD-10-CM | POA: Diagnosis present

## 2021-09-03 DIAGNOSIS — B9620 Unspecified Escherichia coli [E. coli] as the cause of diseases classified elsewhere: Secondary | ICD-10-CM | POA: Diagnosis present

## 2021-09-03 DIAGNOSIS — Z8249 Family history of ischemic heart disease and other diseases of the circulatory system: Secondary | ICD-10-CM | POA: Diagnosis not present

## 2021-09-03 DIAGNOSIS — Z20822 Contact with and (suspected) exposure to covid-19: Secondary | ICD-10-CM | POA: Diagnosis present

## 2021-09-03 DIAGNOSIS — E538 Deficiency of other specified B group vitamins: Secondary | ICD-10-CM | POA: Diagnosis present

## 2021-09-03 DIAGNOSIS — Z87891 Personal history of nicotine dependence: Secondary | ICD-10-CM | POA: Diagnosis not present

## 2021-09-03 DIAGNOSIS — Z950 Presence of cardiac pacemaker: Secondary | ICD-10-CM | POA: Diagnosis not present

## 2021-09-03 DIAGNOSIS — Z955 Presence of coronary angioplasty implant and graft: Secondary | ICD-10-CM | POA: Diagnosis not present

## 2021-09-03 DIAGNOSIS — F4310 Post-traumatic stress disorder, unspecified: Secondary | ICD-10-CM | POA: Diagnosis present

## 2021-09-03 DIAGNOSIS — F32A Depression, unspecified: Secondary | ICD-10-CM | POA: Diagnosis present

## 2021-09-03 DIAGNOSIS — F40240 Claustrophobia: Secondary | ICD-10-CM | POA: Diagnosis present

## 2021-09-03 DIAGNOSIS — I701 Atherosclerosis of renal artery: Secondary | ICD-10-CM | POA: Diagnosis present

## 2021-09-03 DIAGNOSIS — K5900 Constipation, unspecified: Secondary | ICD-10-CM | POA: Diagnosis present

## 2021-09-03 DIAGNOSIS — N400 Enlarged prostate without lower urinary tract symptoms: Secondary | ICD-10-CM | POA: Diagnosis present

## 2021-09-03 NOTE — Plan of Care (Signed)
  Problem: Education: Goal: Knowledge of General Education information will improve Description: Including pain rating scale, medication(s)/side effects and non-pharmacologic comfort measures Outcome: Progressing   Problem: Clinical Measurements: Goal: Ability to maintain clinical measurements within normal limits will improve Outcome: Progressing Goal: Diagnostic test results will improve Outcome: Progressing   Problem: Activity: Goal: Risk for activity intolerance will decrease Outcome: Progressing   Problem: Nutrition: Goal: Adequate nutrition will be maintained Outcome: Progressing   Problem: Coping: Goal: Level of anxiety will decrease Outcome: Progressing   Problem: Elimination: Goal: Will not experience complications related to urinary retention Outcome: Progressing   Problem: Pain Managment: Goal: General experience of comfort will improve Outcome: Progressing   Problem: Safety: Goal: Ability to remain free from injury will improve Outcome: Progressing   Problem: Skin Integrity: Goal: Risk for impaired skin integrity will decrease Outcome: Progressing   Problem: Education: Goal: Knowledge of General Education information will improve Description: Including pain rating scale, medication(s)/side effects and non-pharmacologic comfort measures Outcome: Progressing

## 2021-09-03 NOTE — Progress Notes (Addendum)
PROGRESS NOTE    Kevin Wiley  EXB:[id number] DOB: 03/26/1940 DOA: 09/02/2021 PCP: Burnard Bunting, MD   Brief Narrative: 81 year old with PMH significant for AAA, AMS, anxiety, depression, claustrophobia, PTSD, osteoarthritis, bilateral renal stenosis with a stent placement, BPH, CAD, chronic lower back pain, hyperlipidemia, hypertension, orthostasis, history of sick sinus syndrome status post pacemaker, PAD, sleep apnea not on CPAP, paroxysmal A-fib, not on anticoagulation, presented with generalized weakness, inability to ambulate.  At baseline he is able to use a walker.  Patient was found to have UTI.   Assessment & Plan:   Principal Problem:   Acute UTI (urinary tract infection) Active Problems:   HTN (hypertension)   CAD (coronary artery disease)   Anxiety and depression   Normocytic anemia   B12 deficiency   BPH (benign prostatic hyperplasia)   Generalized weakness   1-UTI: UA: Positive nitrates, white blood cell 21-50 Continue with IV ceftriaxone Follow urine culture.  2-Hypertension: Continue with atenolol  3-CAD: Continue with aspirin beta-blocker and atorvastatin.  4-anxiety and depression: Continue with trazodone and Seroquel Normocytic anemia: Monitor hemoglobin B12 deficiency: Continue with supplement BPH: Continue with Flomax  Generalized weakness: related to UTI PT consulted.     Estimated body mass index is 22.89 kg/m as calculated from the following:   Height as of this encounter: 5' 9"  (1.753 m).   Weight as of this encounter: 70.3 kg.   DVT prophylaxis: SCDs Code Status: Full code Family Communication: No family at bedside Disposition Plan:  Status is: Observation The patient remains OBS appropriate and will d/c before 2 midnights.    Consultants:  none  Procedures:  none  Antimicrobials:    Subjective: He is alert he said that he is not able to walk or do anything, he is feeling weak.  Objective: Vitals:   09/02/21 1336  09/02/21 1359 09/02/21 2029 09/03/21 0440  BP: 127/85 136/76 127/74 (!) 143/73  Pulse: 61 (!) 59 60 60  Resp: 18 19 17 16   Temp:  99.3 F (37.4 C) 99.5 F (37.5 C) 98.8 F (37.1 C)  TempSrc:  Oral Oral Oral  SpO2: 97% 100% 100% 96%  Weight:      Height:        Intake/Output Summary (Last 24 hours) at 09/03/2021 0702 Last data filed at 09/03/2021 0445 Gross per 24 hour  Intake 1135.32 ml  Output 1950 ml  Net -814.68 ml   Filed Weights   09/02/21 0358  Weight: 70.3 kg    Examination:  General exam: Appears calm and comfortable  Respiratory system: Clear to auscultation. Respiratory effort normal. Cardiovascular system: S1 & S2 heard, RRR. No JVD, murmurs, rubs, gallops or clicks. No pedal edema. Gastrointestinal system: Abdomen is nondistended, soft and nontender. No organomegaly or masses felt. Normal bowel sounds heard. Central nervous system: Alert and oriented.  He was able to move bilateral lower extremities slowly. Extremities: Symmetric 5 x 5 power. Skin: No rashes, lesions or ulcers Psychiatry: Judgement and insight appear normal. Mood & affect appropriate.     Data Reviewed: I have personally reviewed following labs and imaging studies  CBC: Recent Labs  Lab 09/02/21 0409  WBC 15.4*  NEUTROABS 13.1*  HGB 12.7*  HCT 37.3*  MCV 94.9  PLT 102*   Basic Metabolic Panel: Recent Labs  Lab 09/02/21 0409  NA 140  K 4.3  CL 106  CO2 26  GLUCOSE 127*  BUN 25*  CREATININE 1.09  CALCIUM 9.3   GFR: Estimated Creatinine  Clearance: 53.7 mL/min (by C-G formula based on SCr of 1.09 mg/dL). Liver Function Tests: Recent Labs  Lab 09/02/21 0409  AST 18  ALT 17  ALKPHOS 60  BILITOT 0.5  PROT 6.5  ALBUMIN 3.9   No results for input(s): "LIPASE", "AMYLASE" in the last 168 hours. No results for input(s): "AMMONIA" in the last 168 hours. Coagulation Profile: No results for input(s): "INR", "PROTIME" in the last 168 hours. Cardiac Enzymes: No results for  input(s): "CKTOTAL", "CKMB", "CKMBINDEX", "TROPONINI" in the last 168 hours. BNP (last 3 results) No results for input(s): "PROBNP" in the last 8760 hours. HbA1C: No results for input(s): "HGBA1C" in the last 72 hours. CBG: No results for input(s): "GLUCAP" in the last 168 hours. Lipid Profile: No results for input(s): "CHOL", "HDL", "LDLCALC", "TRIG", "CHOLHDL", "LDLDIRECT" in the last 72 hours. Thyroid Function Tests: No results for input(s): "TSH", "T4TOTAL", "FREET4", "T3FREE", "THYROIDAB" in the last 72 hours. Anemia Panel: No results for input(s): "VITAMINB12", "FOLATE", "FERRITIN", "TIBC", "IRON", "RETICCTPCT" in the last 72 hours. Sepsis Labs: No results for input(s): "PROCALCITON", "LATICACIDVEN" in the last 168 hours.  Recent Results (from the past 240 hour(s))  SARS Coronavirus 2 by RT PCR (hospital order, performed in [facility] hospital lab) *cepheid single result test* Anterior Nasal Swab     Status: None   Collection Time: 09/02/21  4:09 AM   Specimen: Anterior Nasal Swab  Result Value Ref Range Status   SARS Coronavirus 2 by RT PCR NEGATIVE NEGATIVE Final    Comment: (NOTE) SARS-CoV-2 target nucleic acids are NOT DETECTED.  The SARS-CoV-2 RNA is generally detectable in upper and lower respiratory specimens during the acute phase of infection. The lowest concentration of SARS-CoV-2 viral copies this assay can detect is 250 copies / mL. A negative result does not preclude SARS-CoV-2 infection and should not be used as the sole basis for treatment or other patient management decisions.  A negative result may occur with improper specimen collection / handling, submission of specimen other than nasopharyngeal swab, presence of viral mutation(s) within the areas targeted by this assay, and inadequate number of viral copies (<250 copies / mL). A negative result must be combined with clinical observations, patient history, and epidemiological information.  Fact Sheet for  Patients:   https://www.patel.info/  Fact Sheet for Healthcare Providers: https://hall.com/  This test is not yet approved or  cleared by the [place] FDA and has been authorized for detection and/or diagnosis of SARS-CoV-2 by FDA under an Emergency Use Authorization (EUA).  This EUA will remain in effect (meaning this test can be used) for the duration of the COVID-19 declaration under Section 564(b)(1) of the Act, 21 U.S.C. section 360bbb-3(b)(1), unless the authorization is terminated or revoked sooner.  Performed at [facility], [place] [address], [place], [place] [address]          Radiology Studies: No results found.      Scheduled Meds:  aspirin EC  81 mg Oral Daily   atenolol  50 mg Oral BID   atorvastatin  80 mg Oral QHS   feeding supplement  237 mL Oral BID BM   pantoprazole  40 mg Oral Daily   QUEtiapine  25 mg Oral QHS   senna-docusate  1 tablet Oral BID   tamsulosin  0.4 mg Oral QHS   traZODone  100 mg Oral QHS   vitamin B-12  100 mcg Oral Daily   Continuous Infusions:  cefTRIAXone (ROCEPHIN)  IV  LOS: 0 days    Time spent: 35 minutes    Cheria Sadiq A Jaselle Pryer, MD [facility]   If 7PM-7AM, please contact night-coverage [email or web address]  09/03/2021, 7:02 AM

## 2021-09-03 NOTE — TOC Initial Note (Signed)
Transition of Care [facility]) - Initial/Assessment Note    Patient Details  Name: Kevin Wiley MRN: [id number] Date of Birth: 1940-08-19  Transition of Care [facility]) CM/SW Contact:    Leeroy Cha, RN Phone Number: 09/03/2021, 9:35 AM  Clinical Narrative:                  Transition of Care [facility]) Screening Note   Patient Details  Name: Kevin Wiley Date of Birth: July 17, 1940   Transition of Care [facility]) CM/SW Contact:    Leeroy Cha, RN Phone Number: 09/03/2021, 9:35 AM    Transition of Care Department [facility]) has reviewed patient and no TOC needs have been identified at this time. We will continue to monitor patient advancement through interdisciplinary progression rounds. If new patient transition needs arise, please place a TOC consult.    Expected Discharge Plan: Home/Self Care Barriers to Discharge: Continued Medical Work up   Patient Goals and CMS Choice Patient states their goals for this hospitalization and ongoing recovery are:: to go home CMS Medicare.gov Compare Post Acute Care list provided to:: Patient Choice offered to / list presented to : Patient  Expected Discharge Plan and Services Expected Discharge Plan: Home/Self Care   Discharge Planning Services: CM Consult   Living arrangements for the past 2 months: Single Family Home                                      Prior Living Arrangements/Services Living arrangements for the past 2 months: Single Family Home Lives with:: Spouse Patient language and need for interpreter reviewed:: Yes Do you feel safe going back to the place where you live?: Yes            Criminal Activity/Legal Involvement Pertinent to Current Situation/Hospitalization: No - Comment as needed  Activities of Daily Living Home Assistive Devices/Equipment: Eyeglasses, Hearing aid, Walker (specify type) ADL Screening (condition at time of admission) Patient's cognitive ability adequate to safely complete daily  activities?: Yes Is the patient deaf or have difficulty hearing?: Yes Does the patient have difficulty seeing, even when wearing glasses/contacts?: No Does the patient have difficulty concentrating, remembering, or making decisions?: Yes Patient able to express need for assistance with ADLs?: Yes Does the patient have difficulty dressing or bathing?: Yes Independently performs ADLs?: No Does the patient have difficulty walking or climbing stairs?: Yes Weakness of Legs: Both Weakness of Arms/Hands: Both  Permission Sought/Granted                  Emotional Assessment Appearance:: Appears stated age Attitude/Demeanor/Rapport: Engaged Affect (typically observed): Calm Orientation: : Oriented to Place, Oriented to Self, Oriented to  Time, Oriented to Situation Alcohol / Substance Use: Not Applicable Psych Involvement: No (comment)  Admission diagnosis:  Weakness [R53.1] Acute UTI (urinary tract infection) [N39.0] Urinary tract infection without hematuria, site unspecified [N39.0] Patient Active Problem List   Diagnosis Date Noted   Acute UTI (urinary tract infection) 09/02/2021   Tinnitus 09/02/2021   Mixed conductive and sensorineural hearing loss, bilateral 09/02/2021   Bilateral pseudophakia 09/02/2021   Generalized weakness 09/02/2021   Right leg weakness 07/28/2020   Acute encephalopathy 03/03/2018   BPH (benign prostatic hyperplasia) 03/03/2018   Hypertensive urgency 02/24/2018   GERD (gastroesophageal reflux disease) 03/17/2017   Hyponatremia 02/23/2017   Normocytic anemia 02/23/2017   B12 deficiency 02/23/2017   Folate deficiency 02/23/2017   Intractable  pain 02/15/2017   Dehydration    Anxiety and depression 08/28/2016   Chest pain 07/04/2015   Chest pain with high risk for cardiac etiology 07/04/2015   Paroxysmal atrial tachycardia (HCC) 05/05/2015   Low back pain 08/16/2013   Neck pain 08/16/2013   Polyneuropathy 06/14/2013   Abnormality of gait  06/14/2013   SSS (sick sinus syndrome) ([place]) 06/10/2013   Second degree AV block 06/10/2013   Elective replacement indicated for pacemaker 06/10/2013   Ataxia 05/03/2013   Altered mental status 05/03/2013   Orthostatic hypotension 12/04/2012   HTN (hypertension) 12/04/2012   Hyperlipidemia, mixed 12/04/2012   PTSD (post-traumatic stress disorder) 12/04/2012   CAD (coronary artery disease) 12/04/2012   Pacemaker dual chamber Medtronic 2007, new generator 06/11/13 12/04/2012   Crohn disease ([place]) 12/04/2012   Bilateral renal artery stenosis ([place]) 12/04/2012   Fatigue 12/04/2012   Rhinophyma 03/31/2012   PCP:  Burnard Bunting, MD Pharmacy:   [facility] [id number] [address], [address] AT [facility] [address] [place] [address] Phone: [phone] Fax: [place], [place] - [address] [place] [address] Phone: [phone] Fax: [phone]     Social Determinants of Health (SDOH) Interventions    Readmission Risk Interventions     No data to display

## 2021-09-03 NOTE — Progress Notes (Signed)
With permission from pt, son called and updated via phone. All questions answered.

## 2021-09-04 DIAGNOSIS — N390 Urinary tract infection, site not specified: Secondary | ICD-10-CM | POA: Diagnosis not present

## 2021-09-04 LAB — CBC
HCT: 34 % — ABNORMAL LOW (ref 39.0–52.0)
Hemoglobin: 11.5 g/dL — ABNORMAL LOW (ref 13.0–17.0)
MCH: 31.9 pg (ref 26.0–34.0)
MCHC: 33.8 g/dL (ref 30.0–36.0)
MCV: 94.2 fL (ref 80.0–100.0)
Platelets: 113 10*3/uL — ABNORMAL LOW (ref 150–400)
RBC: 3.61 MIL/uL — ABNORMAL LOW (ref 4.22–5.81)
RDW: 13.2 % (ref 11.5–15.5)
WBC: 5.3 10*3/uL (ref 4.0–10.5)
nRBC: 0 % (ref 0.0–0.2)

## 2021-09-04 LAB — URINE CULTURE: Culture: 50000 — AB

## 2021-09-04 LAB — BASIC METABOLIC PANEL
Anion gap: 7 (ref 5–15)
BUN: 18 mg/dL (ref 8–23)
CO2: 24 mmol/L (ref 22–32)
Calcium: 8.3 mg/dL — ABNORMAL LOW (ref 8.9–10.3)
Chloride: 105 mmol/L (ref 98–111)
Creatinine, Ser: 1 mg/dL (ref 0.61–1.24)
GFR, Estimated: >60 mL/min (ref >60)
Glucose, Bld: 99 mg/dL (ref 70–99)
Potassium: 4 mmol/L (ref 3.5–5.1)
Sodium: 136 mmol/L (ref 135–145)

## 2021-09-04 LAB — MAGNESIUM: Magnesium: 2 mg/dL (ref 1.7–2.4)

## 2021-09-04 MED ORDER — POLYETHYLENE GLYCOL 3350 17 G PO PACK
17.0000 g | PACK | Freq: Every day | ORAL | Status: DC
  Administered 2021-09-04 – 2021-09-05 (×2): 17 g via ORAL
  Filled 2021-09-04 (×2): qty 1

## 2021-09-04 MED ORDER — LORAZEPAM 0.5 MG PO TABS
0.5000 mg | ORAL_TABLET | Freq: Four times a day (QID) | ORAL | Status: DC | PRN
  Administered 2021-09-04: 0.5 mg via ORAL
  Filled 2021-09-04: qty 1

## 2021-09-04 MED ORDER — HYDROXYZINE HCL 10 MG PO TABS: 10.0000 mg | ORAL_TABLET | Freq: Every day | ORAL | Status: DC | PRN

## 2021-09-04 MED ORDER — BISACODYL 10 MG RE SUPP
10.0000 mg | Freq: Once | RECTAL | Status: AC
  Administered 2021-09-04: 10 mg via RECTAL
  Filled 2021-09-04: qty 1

## 2021-09-04 MED ORDER — FLEET ENEMA 7-19 GM/118ML RE ENEM: 1.0000 | ENEMA | Freq: Once | RECTAL | Status: DC

## 2021-09-04 NOTE — Evaluation (Signed)
Physical Therapy Evaluation Patient Details Name: Kevin Wiley MRN: [id number] DOB: 1940/02/28 Today's Date: 09/04/2021  History of Present Illness  81 yo male admitted with UTI, weakness. Hx of PTSD, chronic back pain, chronic shoulder pain, pacemaker, SSS, afib  Clinical Impression  On eval, pt required Min A for mobility. He walked ~50 feet with a RW. Pt reports chronic back and bil shoulder pain. Pt presents with general weakness, decreased activity tolerance, and impaired gait and balance. He reports feeling better than when he first arrived at the hospital. D/C plan is for return home where he lives with his wife. PT recommendation is for HHPT f/u (pt politely declines placement). Will plan to follow and progress activity as tolerated.        Recommendations for follow up therapy are one component of a multi-disciplinary discharge planning process, led by the attending physician.  Recommendations may be updated based on patient status, additional functional criteria and insurance authorization.  Follow Up Recommendations Home health PT      Assistance Recommended at Discharge Frequent or constant Supervision/Assistance  Patient can return home with the following  A little help with walking and/or transfers;A little help with bathing/dressing/bathroom;Help with stairs or ramp for entrance;Assistance with cooking/housework;Assist for transportation    Equipment Recommendations None recommended by PT  Recommendations for Other Services  OT consult    Functional Status Assessment Patient has had a recent decline in their functional status and demonstrates the ability to make significant improvements in function in a reasonable and predictable amount of time.     Precautions / Restrictions Precautions Precautions: Fall Restrictions Weight Bearing Restrictions: No      Mobility  Bed Mobility               General bed mobility comments: oob in recliner     Transfers Overall transfer level: Needs assistance Equipment used: Rolling walker (2 wheels) Transfers: Sit to/from Stand Sit to Stand: Min assist           General transfer comment: Assist to power up, stabilize, control descent. Cues for safety, hand placement. Increased time. LOB posteriorly back into recliner on 1st stand attempt,.    Ambulation/Gait Ambulation/Gait assistance: Min assist Gait Distance (Feet): 50 Feet Assistive device: Rolling walker (2 wheels) Gait Pattern/deviations: Step-through pattern, Decreased stride length, Decreased step length - right       General Gait Details: Intermittent assist to steady pt during ambulation. Pt reports he has bil shoulder discomfort using standard RW-he reports he feels like the rollator is easier for him to maneuver. Pt denied dizziness. R LE tends to lag behind (does not fully swing R LE thru)  Stairs            Wheelchair Mobility    Modified Rankin (Stroke Patients Only)       Balance Overall balance assessment: Needs assistance         Standing balance support: Bilateral upper extremity supported, Reliant on assistive device for balance, During functional activity Standing balance-Leahy Scale: Poor                               Pertinent Vitals/Pain Pain Assessment Pain Assessment: Faces Faces Pain Scale: Hurts little more Pain Location: shoulders, back Pain Descriptors / Indicators: Discomfort, Sore, Aching Pain Intervention(s): Limited activity within patient's tolerance, Monitored during session, Repositioned    Home Living Family/patient expects to be discharged to:: Private residence Living Arrangements:  Spouse/significant other Available Help at Discharge: Family (wife cannot physically assist patient) Type of Home: House Home Access: Stairs to enter Entrance Stairs-Rails: None Entrance Stairs-Number of Steps: 2 Alternate Level Stairs-Number of Steps: 1 flight Home Layout:  Multi-level;Able to live on main level with bedroom/bathroom Home Equipment: Rollator (4 wheels)      Prior Function Prior Level of Function : Needs assist             Mobility Comments: uses rollator for ambulation ADLs Comments: pt states "I do the minimum". "Im scared to get in the shower"     Hand Dominance        Extremity/Trunk Assessment   Upper Extremity Assessment Upper Extremity Assessment: Defer to OT evaluation    Lower Extremity Assessment Lower Extremity Assessment: Generalized weakness    Cervical / Trunk Assessment Cervical / Trunk Assessment: Normal  Communication   Communication: HOH  Cognition Arousal/Alertness: Awake/alert Behavior During Therapy: WFL for tasks assessed/performed Overall Cognitive Status: Within Functional Limits for tasks assessed                                          General Comments      Exercises     Assessment/Plan    PT Assessment Patient needs continued PT services  PT Problem List Decreased strength;Decreased balance;Decreased activity tolerance;Decreased mobility;Decreased knowledge of use of DME       PT Treatment Interventions DME instruction;Gait training;Functional mobility training;Therapeutic activities;Balance training;Patient/family education;Therapeutic exercise    PT Goals (Current goals can be found in the Care Plan section)  Acute Rehab PT Goals Patient Stated Goal: home. regain strength and independence PT Goal Formulation: With patient Time For Goal Achievement: 09/18/21 Potential to Achieve Goals: Good    Frequency Min 3X/week     Co-evaluation               AM-PAC PT "6 Clicks" Mobility  Outcome Measure Help needed turning from your back to your side while in a flat bed without using bedrails?: A Little Help needed moving from lying on your back to sitting on the side of a flat bed without using bedrails?: A Little Help needed moving to and from a bed to a  chair (including a wheelchair)?: A Little Help needed standing up from a chair using your arms (e.g., wheelchair or bedside chair)?: A Little Help needed to walk in hospital room?: A Little Help needed climbing 3-5 steps with a railing? : A Lot 6 Click Score: 17    End of Session Equipment Utilized During Treatment: Gait belt Activity Tolerance: Patient tolerated treatment well Patient left: in chair;with call bell/phone within reach;with chair alarm set   PT Visit Diagnosis: Muscle weakness (generalized) (M62.81);Difficulty in walking, not elsewhere classified (R26.2)    Time: 1657-9038 PT Time Calculation (min) (ACUTE ONLY): 23 min   Charges:   PT Evaluation $PT Eval Moderate Complexity: 1 Mod PT Treatments $Gait Training: 8-22 mins          Doreatha Massed, PT Acute Rehabilitation  Office: [phone] Pager: [phone]

## 2021-09-04 NOTE — Progress Notes (Signed)
Nutrition Brief Note  Patient identified on the Malnutrition Screening Tool (MST) Report.  Wt Readings from Last 15 Encounters:  09/02/21 70.3 kg  09/21/20 69.9 kg  07/28/20 69.1 kg  04/17/20 73.1 kg  01/23/19 86.2 kg  12/21/18 82.6 kg  03/25/18 74.8 kg  03/16/18 74.8 kg  03/02/18 75.2 kg  02/28/18 75.2 kg  10/15/17 79.2 kg  03/17/17 78.5 kg  02/19/17 79.8 kg  02/15/17 80.1 kg  02/03/17 84.4 kg    Body mass index is 22.89 kg/m. Patient meets criteria for normal weight based on current BMI. Skin WDL.   Current diet order is Heart Healthy. He ate 50% of dinner on 7/30, 100% of all meals yesterday, and 75% of breakfast today. Ensure Plus High Protein ordered BID per ONS protocol starting 7/30 afternoon and he has accepted supplement 100% of the time offered.   Labs and medications reviewed.   No nutrition interventions warranted at this time. If nutrition issues arise, please consult RD.      Jarome Matin, MS, RD, LDN, [place] Registered Dietitian II Inpatient Clinical Nutrition RD pager # and on-call/weekend pager # available in [facility]

## 2021-09-04 NOTE — Progress Notes (Addendum)
PROGRESS NOTE    Kevin Wiley  MBW:[id number] DOB: 1940/12/06 DOA: 09/02/2021 PCP: Burnard Bunting, MD   Brief Narrative: 81 year old with PMH significant for AAA, AMS, anxiety, depression, claustrophobia, PTSD, osteoarthritis, bilateral renal stenosis with a stent placement, BPH, CAD, chronic lower back pain, hyperlipidemia, hypertension, orthostasis, history of sick sinus syndrome status post pacemaker, PAD, sleep apnea not on CPAP, paroxysmal A-fib, not on anticoagulation, presented with generalized weakness, inability to ambulate.  At baseline he is able to use a walker.  Patient was found to have UTI.    Assessment & Plan:   Principal Problem:   Acute UTI (urinary tract infection) Active Problems:   HTN (hypertension)   CAD (coronary artery disease)   Anxiety and depression   Normocytic anemia   B12 deficiency   BPH (benign prostatic hyperplasia)   Generalized weakness   1-UTI: UA: Positive nitrates, white blood cell 21-50 Continue with IV ceftriaxone Urine culture. E coli,. Pansensitive  2-Hypertension: Continue with atenolol  3-CAD: Continue with aspirin beta-blocker and atorvastatin.  4-Anxiety and depression: Continue with trazodone and Seroquel More anxious today. Per family patient takes ativan /Valium at home. Will ask Pharmacy to review meds.   Normocytic anemia: Monitor hemoglobin B12 deficiency: Continue with supplement BPH: Continue with Flomax  Generalized weakness: related to UTI PT consulted.   Constipation; had suppository, had partial disimpaction done  by nurse. Plan to give him enema.   Estimated body mass index is 22.89 kg/m as calculated from the following:   Height as of this encounter: 5' 9"  (1.753 m).   Weight as of this encounter: 70.3 kg.   DVT prophylaxis: SCDs Code Status: Full code Family Communication: No family at bedside. Try to contact son for updates, didn't answer phone.  Disposition Plan:  Status is: Observation The  patient remains OBS appropriate and will d/c before 2 midnights. Awaiting PT, might be ready for discharge tomorrow/     Consultants:  none  Procedures:  none  Antimicrobials:    Subjective: He is alert, feels better, but weak.  He is anxious. He said I am not able to go any where like this.   Objective: Vitals:   09/03/21 0440 09/03/21 1305 09/03/21 2145 09/04/21 0451  BP: (!) 143/73 133/74 (!) 163/88 (!) 149/85  Pulse: 60 60 60 (!) 59  Resp: 16 18 18 16   Temp: 98.8 F (37.1 C) 99 F (37.2 C) 98.6 F (37 C) 99.2 F (37.3 C)  TempSrc: Oral  Oral Oral  SpO2: 96% 98% 97% 96%  Weight:      Height:        Intake/Output Summary (Last 24 hours) at 09/04/2021 1301 Last data filed at 09/04/2021 0900 Gross per 24 hour  Intake 480 ml  Output 1502 ml  Net -1022 ml    Filed Weights   09/02/21 0358  Weight: 70.3 kg    Examination:  General exam: NAD Respiratory system: CTA Cardiovascular system: S 1, S 2 RRR Gastrointestinal system: BS present, soft,.  Central nervous system: alert, anxious Extremities: No edema  Data Reviewed: I have personally reviewed following labs and imaging studies  CBC: Recent Labs  Lab 09/02/21 0409 09/04/21 0324  WBC 15.4* 5.3  NEUTROABS 13.1*  --   HGB 12.7* 11.5*  HCT 37.3* 34.0*  MCV 94.9 94.2  PLT 145* 113*    Basic Metabolic Panel: Recent Labs  Lab 09/02/21 0409 09/04/21 0324  NA 140 136  K 4.3 4.0  CL 106 105  CO2 26 24  GLUCOSE 127* 99  BUN 25* 18  CREATININE 1.09 1.00  CALCIUM 9.3 8.3*  MG  --  2.0    GFR: Estimated Creatinine Clearance: 58.6 mL/min (by C-G formula based on SCr of 1 mg/dL). Liver Function Tests: Recent Labs  Lab 09/02/21 0409  AST 18  ALT 17  ALKPHOS 60  BILITOT 0.5  PROT 6.5  ALBUMIN 3.9    No results for input(s): "LIPASE", "AMYLASE" in the last 168 hours. No results for input(s): "AMMONIA" in the last 168 hours. Coagulation Profile: No results for input(s): "INR", "PROTIME" in  the last 168 hours. Cardiac Enzymes: No results for input(s): "CKTOTAL", "CKMB", "CKMBINDEX", "TROPONINI" in the last 168 hours. BNP (last 3 results) No results for input(s): "PROBNP" in the last 8760 hours. HbA1C: No results for input(s): "HGBA1C" in the last 72 hours. CBG: No results for input(s): "GLUCAP" in the last 168 hours. Lipid Profile: No results for input(s): "CHOL", "HDL", "LDLCALC", "TRIG", "CHOLHDL", "LDLDIRECT" in the last 72 hours. Thyroid Function Tests: No results for input(s): "TSH", "T4TOTAL", "FREET4", "T3FREE", "THYROIDAB" in the last 72 hours. Anemia Panel: No results for input(s): "VITAMINB12", "FOLATE", "FERRITIN", "TIBC", "IRON", "RETICCTPCT" in the last 72 hours. Sepsis Labs: No results for input(s): "PROCALCITON", "LATICACIDVEN" in the last 168 hours.  Recent Results (from the past 240 hour(s))  SARS Coronavirus 2 by RT PCR (hospital order, performed in [facility] hospital lab) *cepheid single result test* Anterior Nasal Swab     Status: None   Collection Time: 09/02/21  4:09 AM   Specimen: Anterior Nasal Swab  Result Value Ref Range Status   SARS Coronavirus 2 by RT PCR NEGATIVE NEGATIVE Final    Comment: (NOTE) SARS-CoV-2 target nucleic acids are NOT DETECTED.  The SARS-CoV-2 RNA is generally detectable in upper and lower respiratory specimens during the acute phase of infection. The lowest concentration of SARS-CoV-2 viral copies this assay can detect is 250 copies / mL. A negative result does not preclude SARS-CoV-2 infection and should not be used as the sole basis for treatment or other patient management decisions.  A negative result may occur with improper specimen collection / handling, submission of specimen other than nasopharyngeal swab, presence of viral mutation(s) within the areas targeted by this assay, and inadequate number of viral copies (<250 copies / mL). A negative result must be combined with clinical observations, patient  history, and epidemiological information.  Fact Sheet for Patients:   https://www.patel.info/  Fact Sheet for Healthcare Providers: https://hall.com/  This test is not yet approved or  cleared by the [place] FDA and has been authorized for detection and/or diagnosis of SARS-CoV-2 by FDA under an Emergency Use Authorization (EUA).  This EUA will remain in effect (meaning this test can be used) for the duration of the COVID-19 declaration under Section 564(b)(1) of the Act, 21 U.S.C. section 360bbb-3(b)(1), unless the authorization is terminated or revoked sooner.  Performed at [facility], [place] [address], [place], [place] [address]   Urine Culture     Status: Abnormal   Collection Time: 09/02/21  4:37 AM   Specimen: Urine, Clean Catch  Result Value Ref Range Status   Specimen Description   Final    URINE, CLEAN CATCH Performed at [facility], [place] [address] [place], [place] [address]    Special Requests   Final    NONE Performed at [facility], [place] [address] [place], [place] [address]  Culture 50,000 COLONIES/mL ESCHERICHIA COLI (A)  Final   Report Status 09/04/2021 FINAL  Final   Organism ID, Bacteria ESCHERICHIA COLI (A)  Final      Susceptibility   Escherichia coli - MIC*    AMPICILLIN 4 SENSITIVE Sensitive     CEFAZOLIN <=4 SENSITIVE Sensitive     CEFEPIME <=0.12 SENSITIVE Sensitive     CEFTRIAXONE <=0.25 SENSITIVE Sensitive     CIPROFLOXACIN <=0.25 SENSITIVE Sensitive     GENTAMICIN <=1 SENSITIVE Sensitive     IMIPENEM <=0.25 SENSITIVE Sensitive     NITROFURANTOIN <=16 SENSITIVE Sensitive     TRIMETH/SULFA <=20 SENSITIVE Sensitive     AMPICILLIN/SULBACTAM <=2 SENSITIVE Sensitive     PIP/TAZO <=4 SENSITIVE Sensitive     * 50,000 COLONIES/mL ESCHERICHIA COLI         Radiology Studies: No results found.      Scheduled Meds:  aspirin EC  81 mg  Oral Daily   atenolol  50 mg Oral BID   atorvastatin  80 mg Oral QHS   feeding supplement  237 mL Oral BID BM   pantoprazole  40 mg Oral Daily   polyethylene glycol  17 g Oral Daily   QUEtiapine  25 mg Oral QHS   senna-docusate  1 tablet Oral BID   sodium phosphate  1 enema Rectal Once   tamsulosin  0.4 mg Oral QHS   traZODone  100 mg Oral QHS   vitamin B-12  100 mcg Oral Daily   Continuous Infusions:  cefTRIAXone (ROCEPHIN)  IV 1 g (09/04/21 0939)     LOS: 1 day    Time spent: 35 minutes    Brisia Schuermann A Rogena Deupree, MD [facility]   If 7PM-7AM, please contact night-coverage [email or web address]  09/04/2021, 1:01 PM

## 2021-09-05 DIAGNOSIS — N390 Urinary tract infection, site not specified: Secondary | ICD-10-CM | POA: Diagnosis not present

## 2021-09-05 DIAGNOSIS — R531 Weakness: Secondary | ICD-10-CM | POA: Diagnosis not present

## 2021-09-05 DIAGNOSIS — I2510 Atherosclerotic heart disease of native coronary artery without angina pectoris: Secondary | ICD-10-CM

## 2021-09-05 DIAGNOSIS — F419 Anxiety disorder, unspecified: Secondary | ICD-10-CM

## 2021-09-05 DIAGNOSIS — I10 Essential (primary) hypertension: Secondary | ICD-10-CM

## 2021-09-05 DIAGNOSIS — N400 Enlarged prostate without lower urinary tract symptoms: Secondary | ICD-10-CM | POA: Diagnosis not present

## 2021-09-05 DIAGNOSIS — F32A Depression, unspecified: Secondary | ICD-10-CM

## 2021-09-05 MED ORDER — HYDRALAZINE HCL 25 MG PO TABS
25.0000 mg | ORAL_TABLET | Freq: Four times a day (QID) | ORAL | Status: DC | PRN
  Administered 2021-09-05: 25 mg via ORAL
  Filled 2021-09-05: qty 1

## 2021-09-05 MED ORDER — AMOXICILLIN 250 MG PO CAPS
500.0000 mg | ORAL_CAPSULE | Freq: Two times a day (BID) | ORAL | Status: DC
  Administered 2021-09-06: 500 mg via ORAL
  Filled 2021-09-05: qty 2

## 2021-09-05 MED ORDER — POLYETHYLENE GLYCOL 3350 17 G PO PACK
17.0000 g | PACK | Freq: Two times a day (BID) | ORAL | Status: DC
  Administered 2021-09-06: 17 g via ORAL
  Filled 2021-09-05 (×2): qty 1

## 2021-09-05 NOTE — Progress Notes (Signed)
[facility]  PROGRESS NOTE  Kevin Wiley YBO:[id number] DOB: 1940-11-09 DOA: 09/02/2021 PCP: Burnard Bunting, MD   Brief HPI:   81 year old male with past medical history of AAA, ALT mental status, anxiety, depression, claustrophobia, PTSD, osteoarthritis, bilateral renal artery stenosis with stent placement, BPH, CAD, chronic back pain, hypertension, history of sick sinus syndrome status post pacemaker placement, PAD, sleep apnea not on CPAP, paroxysmal atrial fibrillation not on anticoagulation presents with generalized weakness, inability to ambulate.  At baseline patient is able to use walker.  He was found to have UTI.    Subjective   Patient seen and examined, denies any complaints.  Does not want to go to skilled nursing facility.  Patient says that he does not feel that he is strong enough to go home.   Assessment/Plan:     UTI -Presented abnormal UA with positive nitrite, leukocytes in urine -Urine culture grew E. coli which was pansensitive -Will switch from Rocephin to p.o. amoxicillin  Hypertension -Patient is on atenolol 50 mg p.o. twice daily -Blood pressure is mildly elevated; likely from agitation -We will start hydralazine as needed  CAD -Continue with aspirin, beta-blocker and atorvastatin  Anxiety/depression -Continue trazodone, Seroquel -Has been agitated this morning -We will try to find out whether patient was taking benzodiazepines at home  Normocytic anemia -Hemoglobin stable at 11.5  BPH -Continue Flomax  B12 deficiency -Continue vitamin B-12 supplementation  Constipation -No improvement with suppository -We will increase dose of MiraLAX to 17 g p.o. twice daily  Social issue -Patient refused to be discharged today as he feels weak, has been agitated this morning -Refused to go to skilled nursing facility -We will let him work today with physical therapy and discharge in a.m.   Medications     [START ON 09/06/2021] amoxicillin   500 mg Oral Q12H   aspirin EC  81 mg Oral Daily   atenolol  50 mg Oral BID   atorvastatin  80 mg Oral QHS   pantoprazole  40 mg Oral Daily   polyethylene glycol  17 g Oral Daily   QUEtiapine  25 mg Oral QHS   senna-docusate  1 tablet Oral BID   sodium phosphate  1 enema Rectal Once   tamsulosin  0.4 mg Oral QHS   traZODone  100 mg Oral QHS   vitamin B-12  100 mcg Oral Daily     Data Reviewed:   CBG:  No results for input(s): "GLUCAP" in the last 168 hours.  SpO2: 98 %    Vitals:   09/04/21 0451 09/04/21 1300 09/04/21 2102 09/05/21 0503  BP: (!) 149/85 (!) 160/90 (!) 151/78 (!) 181/95  Pulse: (!) 59 60 61 60  Resp: 16 20 18 16   Temp: 99.2 F (37.3 C) 98.9 F (37.2 C) 98.2 F (36.8 C) 98.1 F (36.7 C)  TempSrc: Oral Oral Oral Oral  SpO2: 96% 100% 98% 98%  Weight:      Height:          Data Reviewed:  Basic Metabolic Panel: Recent Labs  Lab 09/02/21 0409 09/04/21 0324  NA 140 136  K 4.3 4.0  CL 106 105  CO2 26 24  GLUCOSE 127* 99  BUN 25* 18  CREATININE 1.09 1.00  CALCIUM 9.3 8.3*  MG  --  2.0    CBC: Recent Labs  Lab 09/02/21 0409 09/04/21 0324  WBC 15.4* 5.3  NEUTROABS 13.1*  --   HGB 12.7* 11.5*  HCT 37.3* 34.0*  MCV  94.9 94.2  PLT 145* 113*    LFT Recent Labs  Lab 09/02/21 0409  AST 18  ALT 17  ALKPHOS 60  BILITOT 0.5  PROT 6.5  ALBUMIN 3.9     Antibiotics: Anti-infectives (From admission, onward)    Start     Dose/Rate Route Frequency Ordered Stop   09/06/21 1000  amoxicillin (AMOXIL) capsule 500 mg        500 mg Oral Every 12 hours 09/05/21 1249 09/09/21 0959   09/03/21 1000  cefTRIAXone (ROCEPHIN) 1 g in sodium chloride 0.9 % 100 mL IVPB  Status:  Discontinued        1 g 200 mL/hr over 30 Minutes Intravenous Every 24 hours 09/02/21 1039 09/05/21 1249   09/02/21 0930  cefTRIAXone (ROCEPHIN) 1 g in sodium chloride 0.9 % 100 mL IVPB        1 g 200 mL/hr over 30 Minutes Intravenous  Once 09/02/21 0916 09/02/21 1021         DVT prophylaxis: SCDs  Code Status: Full code  Family Communication: No family at bedside   CONSULTS    Objective    Physical Examination:   General Alert oriented x3 Did not examine the patient as patient was agitated  Status is: Inpatient:             [name]   [facility] If 7PM-7AM, please contact night-coverage at [email or web address], Office  [phone]   09/05/2021, 1:46 PM  LOS: 2 days

## 2021-09-05 NOTE — Progress Notes (Signed)
Physical Therapy Treatment Patient Details Name: Kevin Wiley MRN: [id number] DOB: [birth year] Today's Date: 09/05/2021   History of Present Illness 81 yo male admitted with UTI, weakness. Hx of PTSD, chronic back pain, chronic shoulder pain, pacemaker, SSS, afib    PT Comments    Pt not pleased to be mobilizing but agreeable to ambulate and provided rollator (which pt typically uses at home) today for ambulating.  Pt continues to require min assist for stability.    Recommendations for follow up therapy are one component of a multi-disciplinary discharge planning process, led by the attending physician.  Recommendations may be updated based on patient status, additional functional criteria and insurance authorization.  Follow Up Recommendations  Home health PT     Assistance Recommended at Discharge Frequent or constant Supervision/Assistance  Patient can return home with the following A little help with walking and/or transfers;A little help with bathing/dressing/bathroom;Help with stairs or ramp for entrance;Assistance with cooking/housework;Assist for transportation   Equipment Recommendations  None recommended by PT    Recommendations for Other Services       Precautions / Restrictions Precautions Precautions: Fall Restrictions Weight Bearing Restrictions: No     Mobility  Bed Mobility Overal bed mobility: Modified Independent             General bed mobility comments: increased time.    Transfers Overall transfer level: Needs assistance Equipment used: Rollator (4 wheels) Transfers: Sit to/from Stand Sit to Stand: Min assist           General transfer comment: pt reported he needed assist, cues to try and then therapist would assist as needed, light assist to rise and steady; required cues for bring rollator while turning/backing up to bed    Ambulation/Gait Ambulation/Gait assistance: Min assist Gait Distance (Feet): 80 Feet Assistive device:  Rollator (4 wheels) Gait Pattern/deviations: Step-through pattern, Decreased stride length, Decreased step length - right       General Gait Details: Intermittent assist to steady pt during ambulation. R LE tends to lag behind (does not fully swing R LE through)   Stairs             Wheelchair Mobility    Modified Rankin (Stroke Patients Only)       Balance Overall balance assessment: Needs assistance         Standing balance support: During functional activity, Reliant on assistive device for balance, Bilateral upper extremity supported Standing balance-Leahy Scale: Poor                              Cognition Arousal/Alertness: Awake/alert Behavior During Therapy: Flat affect Overall Cognitive Status: Within Functional Limits for tasks assessed                                          Exercises      General Comments        Pertinent Vitals/Pain Pain Assessment Pain Assessment: No/denies pain    Home Living Family/patient expects to be discharged to:: Private residence Living Arrangements: Spouse/significant other Available Help at Discharge: Family (wife cannot physically assist patient) Type of Home: House Home Access: Stairs to enter;Elevator Entrance Stairs-Rails: None Entrance Stairs-Number of Steps: 2 Alternate Level Stairs-Number of Steps: 1 flight Home Layout: Multi-level;Able to live on main level with bedroom/bathroom Home Equipment: Rollator (4 wheels) Additional Comments:  Due to patient's anger at his circumstances unable to get a good idea of his PLOF.    Prior Function            PT Goals (current goals can now be found in the care plan section) Progress towards PT goals: Progressing toward goals    Frequency    Min 3X/week      PT Plan Current plan remains appropriate    Co-evaluation              AM-PAC PT "6 Clicks" Mobility   Outcome Measure  Help needed turning from your back to  your side while in a flat bed without using bedrails?: A Little Help needed moving from lying on your back to sitting on the side of a flat bed without using bedrails?: A Little Help needed moving to and from a bed to a chair (including a wheelchair)?: A Little Help needed standing up from a chair using your arms (e.g., wheelchair or bedside chair)?: A Little Help needed to walk in hospital room?: A Little Help needed climbing 3-5 steps with a railing? : A Lot 6 Click Score: 17    End of Session Equipment Utilized During Treatment: Gait belt Activity Tolerance: Patient tolerated treatment well Patient left: in bed;with call bell/phone within reach   PT Visit Diagnosis: Muscle weakness (generalized) (M62.81);Difficulty in walking, not elsewhere classified (R26.2)     Time: 1051-1100 PT Time Calculation (min) (ACUTE ONLY): 9 min  Charges:  $Gait Training: 8-22 mins                    Jannette Spanner PT, DPT Physical Therapist Acute Rehabilitation Services Preferred contact method: Secure Chat Weekend Pager Only: [phone] Office: [place] 09/05/2021, 11:47 AM

## 2021-09-05 NOTE — TOC Progression Note (Signed)
Transition of Care [facility]) - Progression Note    Patient Details  Name: JONES VIVIANI MRN: [id number] Date of Birth: [birth year]  [facility]) CM/SW Contact  Leeroy Cha, RN Phone Number: 09/05/2021, 11:52 AM  Clinical Narrative:    D[poke with the patient who is interested in there pace program.  Sara Chu.  Full explanation of the pace program given over the phone to the son.  Both the patient and the wife would be benefit from the program if accepted.  Son has the number to PACE and will call about getting the couple into the program.  Also states that the patient is not the main caregiver for the wife.  That the wife is more mobile than the patient.  He does help with her daily activities but is not bound at home to her. Information left in the room with the patient.   Expected Discharge Plan: Home/Self Care Barriers to Discharge: Continued Medical Work up  Expected Discharge Plan and Services Expected Discharge Plan: Home/Self Care   Discharge Planning Services: CM Consult   Living arrangements for the past 2 months: Single Family Home                                       Social Determinants of Health (SDOH) Interventions    Readmission Risk Interventions     No data to display

## 2021-09-05 NOTE — Evaluation (Signed)
Occupational Therapy Evaluation Patient Details Name: Kevin Wiley MRN: [id number] DOB: 08/31/1940 Today's Date: 09/05/2021   History of Present Illness 81 yo male admitted with UTI, weakness. Hx of PTSD, chronic back pain, chronic shoulder pain, pacemaker, SSS, afib   Clinical Impression   Mr. Kevin Wiley is an 81 year old man who presents with above medical history. Today he is disgruntled and somewhat verbally aggressive when discussing POC. He reports he just sits around at home and "can't do nothing" but he also vaguely reports he is also able to dress himself and toilet by himself. Getting information out of him is difficult due to his angry attitude. He was able to stand with min guard and ambulate to bathroom and perform toileting and then returned himself to bed. Then looked at therapist and stated "That's it." Therapist educated patient on POC options including home health services and short term rehab since he reports deficits. He then reported "I'm never going to be able to do anything again" and did not appear to be overly happy with the idea of short term rehab. He reports "I just wanna die." When asked if he told the doctor that he yelled "Why would I tell that doctor." When asked if he wanted to talk to somebody he stated "I've been talking to psychiatrist for 15 years!!" When I offered the chaplain he said "Hell no!" Rn notified of conversation. Unsure of plan at discharge. Recommend short term rehab if he is agreeable since he reports difficulties at home and that his wife can't help him. IF not agreeable - attempt Home Health services. Patient's anger and apparent depression may be limiting.      Recommendations for follow up therapy are one component of a multi-disciplinary discharge planning process, led by the attending physician.  Recommendations may be updated based on patient status, additional functional criteria and insurance authorization.   Follow Up Recommendations   Skilled nursing-short term rehab (<3 hours/day)    Assistance Recommended at Discharge Frequent or constant Supervision/Assistance  Patient can return home with the following A little help with walking and/or transfers;A little help with bathing/dressing/bathroom;Help with stairs or ramp for entrance;Assistance with cooking/housework    Functional Status Assessment  Patient has had a recent decline in their functional status and demonstrates the ability to make significant improvements in function in a reasonable and predictable amount of time.  Equipment Recommendations  None recommended by OT    Recommendations for Other Services       Precautions / Restrictions Precautions Precautions: Fall Restrictions Weight Bearing Restrictions: No      Mobility Bed Mobility Overal bed mobility: Modified Independent             General bed mobility comments: increased time.    Transfers Overall transfer level: Needs assistance Equipment used: Rolling walker (2 wheels) Transfers: Sit to/from Stand Sit to Stand: Min guard           General transfer comment: Min guard to stand ambulate to bathroom in room.      Balance Overall balance assessment: Needs assistance Sitting-balance support: No upper extremity supported, Feet supported Sitting balance-Leahy Scale: Good     Standing balance support: During functional activity, Reliant on assistive device for balance Standing balance-Leahy Scale: Poor                             ADL either performed or assessed with clinical judgement   ADL  Overall ADL's : Needs assistance/impaired Eating/Feeding: Independent   Grooming: Set up   Upper Body Bathing: Set up   Lower Body Bathing: Minimal assistance;Sit to/from stand   Upper Body Dressing : Sitting;Moderate assistance   Lower Body Dressing: Sit to/from stand;Moderate assistance   Toilet Transfer: Min guard;Rolling walker (2 wheels);Regular Toilet;Grab  bars   Toileting- Clothing Manipulation and Hygiene: Min guard;Sit to/from stand Toileting - Clothing Manipulation Details (indicate cue type and reason): able to manage bowel management     Functional mobility during ADLs: Cueing for safety;Min guard;Rolling walker (2 wheels)       Vision   Vision Assessment?: No apparent visual deficits     Perception     Praxis      Pertinent Vitals/Pain Pain Assessment Pain Assessment: No/denies pain     Hand Dominance Right   Extremity/Trunk Assessment Upper Extremity Assessment Upper Extremity Assessment: Overall WFL for tasks assessed (functional for ADLs but decreased shoulder ROM from "bad shoulders")   Lower Extremity Assessment Lower Extremity Assessment: Defer to PT evaluation   Cervical / Trunk Assessment Cervical / Trunk Assessment: Normal   Communication Communication Communication: HOH   Cognition Arousal/Alertness: Awake/alert Behavior During Therapy: WFL for tasks assessed/performed Overall Cognitive Status: Within Functional Limits for tasks assessed                                 General Comments: Patient is alert and oriented but he is disgruntled and angry.     General Comments       Exercises     Shoulder Instructions      Home Living Family/patient expects to be discharged to:: Private residence Living Arrangements: Spouse/significant other Available Help at Discharge: Family (wife cannot physically assist patient) Type of Home: House Home Access: Stairs to Probation officer of Steps: 2 Entrance Stairs-Rails: None Home Layout: Multi-level;Able to live on main level with bedroom/bathroom Alternate Level Stairs-Number of Steps: 1 flight Alternate Level Stairs-Rails: Right     Bathroom Toilet: Standard     Home Equipment: Rollator (4 wheels)   Additional Comments: Due to patient's anger at his circumstances unable to get a good idea of his PLOF.       Prior Functioning/Environment Prior Level of Function : Needs assist             Mobility Comments: uses rollator for ambulation ADLs Comments: pt states "I do the minimum". "Im scared to get in the shower"        OT Problem List: Decreased activity tolerance;Impaired balance (sitting and/or standing);Decreased knowledge of use of DME or AE;Decreased knowledge of precautions      OT Treatment/Interventions: Self-care/ADL training;Therapeutic exercise;DME and/or AE instruction;Therapeutic activities;Balance training;Patient/family education    OT Goals(Current goals can be found in the care plan section) Acute Rehab OT Goals Patient Stated Goal: did not state due to anger OT Goal Formulation: Patient unable to participate in goal setting Time For Goal Achievement: 09/19/21 Potential to Achieve Goals: Fair  OT Frequency: Min 2X/week    Co-evaluation              AM-PAC OT "6 Clicks" Daily Activity     Outcome Measure Help from another person eating meals?: None Help from another person taking care of personal grooming?: A Little Help from another person toileting, which includes using toliet, bedpan, or urinal?: A Little Help from another person bathing (including washing, rinsing, drying)?: A Little  Help from another person to put on and taking off regular upper body clothing?: A Little Help from another person to put on and taking off regular lower body clothing?: A Lot 6 Click Score: 18   End of Session Equipment Utilized During Treatment: Rolling walker (2 wheels) Nurse Communication: Mobility status  Activity Tolerance: Patient tolerated treatment well Patient left: in bed;with call bell/phone within reach;with bed alarm set  OT Visit Diagnosis: Unsteadiness on feet (R26.81)                Time: 1001-1015 OT Time Calculation (min): 14 min Charges:  OT General Charges $OT Visit: 1 Visit OT Evaluation $OT Eval Low Complexity: 1 Low  Daquann Merriott, OTR/L [place]  Office [phone] Pager: [place] 09/05/2021, 11:00 AM

## 2021-09-06 DIAGNOSIS — E538 Deficiency of other specified B group vitamins: Secondary | ICD-10-CM | POA: Diagnosis not present

## 2021-09-06 DIAGNOSIS — N390 Urinary tract infection, site not specified: Secondary | ICD-10-CM | POA: Diagnosis not present

## 2021-09-06 DIAGNOSIS — F419 Anxiety disorder, unspecified: Secondary | ICD-10-CM | POA: Diagnosis not present

## 2021-09-06 DIAGNOSIS — D649 Anemia, unspecified: Secondary | ICD-10-CM

## 2021-09-06 DIAGNOSIS — R531 Weakness: Secondary | ICD-10-CM | POA: Diagnosis not present

## 2021-09-06 MED ORDER — POLYETHYLENE GLYCOL 3350 17 G PO PACK
17.0000 g | PACK | Freq: Every day | ORAL | 0 refills | Status: AC | PRN
Start: 2021-09-06 — End: ?

## 2021-09-06 MED ORDER — AMOXICILLIN 500 MG PO CAPS: 500.0000 mg | ORAL_CAPSULE | Freq: Two times a day (BID) | ORAL | 0 refills | Status: DC

## 2021-09-06 MED ORDER — ATENOLOL 100 MG PO TABS: 100.0000 mg | ORAL_TABLET | Freq: Every day | ORAL | 3 refills | Status: AC

## 2021-09-06 NOTE — Discharge Summary (Signed)
Physician Discharge Summary   Patient: Kevin Wiley MRN: [id number] DOB: October 08, 1940  Admit date:     09/02/2021  Discharge date: 09/06/21  Discharge Physician: [name]   PCP: Burnard Bunting, MD   Recommendations at discharge:   Follow-up PCP in 2 weeks We will discharge patient on  Discharge Diagnoses:  Principal Problem:   Acute UTI (urinary tract infection)  Active Problems:   HTN (hypertension)   CAD (coronary artery disease)   Anxiety and depression   Normocytic anemia   B12 deficiency   BPH (benign prostatic hyperplasia)   Constipation Generalized weakness   Hospital Course: 81 year old male with past medical history of AAA, ALT mental status, anxiety, depression, claustrophobia, PTSD, osteoarthritis, bilateral renal artery stenosis with stent placement, BPH, CAD, chronic back pain, hypertension, history of sick sinus syndrome status post pacemaker placement, PAD, sleep apnea not on CPAP, paroxysmal atrial fibrillation not on anticoagulation presents with generalized weakness, inability to ambulate.  At baseline patient is able to use walker.  He was found to have UTI.   Assessment and Plan:  UTI -Presented abnormal UA with positive nitrite, leukocytes in urine -Urine culture grew E. coli which was pansensitive -Will switch from Rocephin to p.o. amoxicillin -We will discharge patient on doxycycline for 3 more days to complete 7 days of treatment   Hypertension -Patient is stable, continue atenolol, Imdur, hydralazine -   CAD -Continue with aspirin, beta-blocker and atorvastatin   Anxiety/depression -Continue trazodone, Seroquel    Normocytic anemia -Hemoglobin stable at 11.5   BPH -Continue Flomax   B12 deficiency -Continue vitamin B-12 supplementation   Constipation -No improvement with suppository -We will discharge on MiraLAX 17 g p.o. daily as needed for constipation  Generalized weakness -Likely in setting of UTI -PT recommends home  health PT -We will discharge patient with home health PT, OT, home health aide      Consultants:  Procedures performed:  Disposition: Home Diet recommendation:  Discharge Diet Orders (From admission, onward)     Start     Ordered   09/06/21 0000  Diet - low sodium heart healthy        09/06/21 1042           Cardiac diet DISCHARGE MEDICATION: Allergies as of 09/06/2021       Reactions   Amlodipine Besy-benazepril Hcl Other (See Comments)   Hytrin [terazosin] Nausea And Vomiting   Reported by [facility]        Medication List     TAKE these medications    amoxicillin 500 MG capsule Commonly known as: AMOXIL Take 1 capsule (500 mg total) by mouth every 12 (twelve) hours.   aspirin EC 81 MG tablet Take 1 tablet (81 mg total) by mouth daily. Swallow whole.   atenolol 100 MG tablet Commonly known as: TENORMIN Take 1 tablet (100 mg total) by mouth daily.   atorvastatin 80 MG tablet Commonly known as: LIPITOR TAKE 1 TABLET(80 MG) BY MOUTH AT BEDTIME   gabapentin 800 MG tablet Commonly known as: NEURONTIN Take 1 tablet (800 mg total) by mouth 3 (three) times daily.   hydrALAZINE 50 MG tablet Commonly known as: APRESOLINE Take 1 tablet (50 mg total) by mouth 3 (three) times daily.   isosorbide mononitrate 30 MG 24 hr tablet Commonly known as: IMDUR Take 1 tablet (30 mg total) by mouth daily.   methocarbamol 500 MG tablet Commonly known as: ROBAXIN Take 1 tablet (500 mg total) by mouth every 8 (eight) hours  as needed for muscle spasms.   nitroGLYCERIN 0.4 MG SL tablet Commonly known as: NITROSTAT Place 1 tablet (0.4 mg total) under the tongue every 5 (five) minutes as needed for chest pain (times 3 times then call MD).   omeprazole 20 MG capsule Commonly known as: PRILOSEC Take 1 capsule (20 mg total) by mouth daily.   oxybutynin 5 MG 24 hr tablet Commonly known as: DITROPAN-XL Take 5 mg by mouth daily.   polyethylene glycol 17 g packet Commonly known  as: MIRALAX / GLYCOLAX Take 17 g by mouth daily as needed for moderate constipation.   QUEtiapine 25 MG tablet Commonly known as: SEROQUEL Take 25 mg by mouth at bedtime.   tamsulosin 0.4 MG Caps capsule Commonly known as: FLOMAX Take 1 capsule (0.4 mg total) by mouth at bedtime.   traZODone 100 MG tablet Commonly known as: DESYREL Take 1 tablet (100 mg total) by mouth at bedtime. What changed: how much to take   vitamin B-12 100 MCG tablet Commonly known as: CYANOCOBALAMIN Take 100 mcg by mouth daily.   zolpidem 10 MG tablet Commonly known as: AMBIEN Take 10 mg by mouth at bedtime.        Discharge Exam: Filed Weights   09/02/21 0358  Weight: 70.3 kg   General-appears in no acute distress Heart-S1-S2, regular, no murmur auscultated Lungs-clear to auscultation bilaterally, no wheezing or crackles auscultated Abdomen-soft, nontender, no organomegaly Extremities-no edema in the lower extremities Neuro-alert, oriented x3, no focal deficit noted  Condition at discharge: good  The results of significant diagnostics from this hospitalization (including imaging, microbiology, ancillary and laboratory) are listed below for reference.   Imaging Studies: No results found.  Microbiology: Results for orders placed or performed during the hospital encounter of 09/02/21  SARS Coronavirus 2 by RT PCR (hospital order, performed in [facility] hospital lab) *cepheid single result test* Anterior Nasal Swab     Status: None   Collection Time: 09/02/21  4:09 AM   Specimen: Anterior Nasal Swab  Result Value Ref Range Status   SARS Coronavirus 2 by RT PCR NEGATIVE NEGATIVE Final    Comment: (NOTE) SARS-CoV-2 target nucleic acids are NOT DETECTED.  The SARS-CoV-2 RNA is generally detectable in upper and lower respiratory specimens during the acute phase of infection. The lowest concentration of SARS-CoV-2 viral copies this assay can detect is 250 copies / mL. A negative result  does not preclude SARS-CoV-2 infection and should not be used as the sole basis for treatment or other patient management decisions.  A negative result may occur with improper specimen collection / handling, submission of specimen other than nasopharyngeal swab, presence of viral mutation(s) within the areas targeted by this assay, and inadequate number of viral copies (<250 copies / mL). A negative result must be combined with clinical observations, patient history, and epidemiological information.  Fact Sheet for Patients:   https://www.patel.info/  Fact Sheet for Healthcare Providers: https://hall.com/  This test is not yet approved or  cleared by the [place] FDA and has been authorized for detection and/or diagnosis of SARS-CoV-2 by FDA under an Emergency Use Authorization (EUA).  This EUA will remain in effect (meaning this test can be used) for the duration of the COVID-19 declaration under Section 564(b)(1) of the Act, 21 U.S.C. section 360bbb-3(b)(1), unless the authorization is terminated or revoked sooner.  Performed at [facility], [place] [address], [place], [place] [address]   Urine Culture     Status: Abnormal  Collection Time: 09/02/21  4:37 AM   Specimen: Urine, Clean Catch  Result Value Ref Range Status   Specimen Description   Final    URINE, CLEAN CATCH Performed at [facility], [place] [address], [place], [place] [address]    Special Requests   Final    NONE Performed at [facility], [place] [address] [place], [place] [address]    Culture 50,000 COLONIES/mL ESCHERICHIA COLI (A)  Final   Report Status 09/04/2021 FINAL  Final   Organism ID, Bacteria ESCHERICHIA COLI (A)  Final      Susceptibility   Escherichia coli - MIC*    AMPICILLIN 4 SENSITIVE Sensitive     CEFAZOLIN <=4 SENSITIVE Sensitive     CEFEPIME <=0.12 SENSITIVE Sensitive     CEFTRIAXONE  <=0.25 SENSITIVE Sensitive     CIPROFLOXACIN <=0.25 SENSITIVE Sensitive     GENTAMICIN <=1 SENSITIVE Sensitive     IMIPENEM <=0.25 SENSITIVE Sensitive     NITROFURANTOIN <=16 SENSITIVE Sensitive     TRIMETH/SULFA <=20 SENSITIVE Sensitive     AMPICILLIN/SULBACTAM <=2 SENSITIVE Sensitive     PIP/TAZO <=4 SENSITIVE Sensitive     * 50,000 COLONIES/mL ESCHERICHIA COLI    Labs: CBC: Recent Labs  Lab 09/02/21 0409 09/04/21 0324  WBC 15.4* 5.3  NEUTROABS 13.1*  --   HGB 12.7* 11.5*  HCT 37.3* 34.0*  MCV 94.9 94.2  PLT 145* 754*   Basic Metabolic Panel: Recent Labs  Lab 09/02/21 0409 09/04/21 0324  NA 140 136  K 4.3 4.0  CL 106 105  CO2 26 24  GLUCOSE 127* 99  BUN 25* 18  CREATININE 1.09 1.00  CALCIUM 9.3 8.3*  MG  --  2.0   Liver Function Tests: Recent Labs  Lab 09/02/21 0409  AST 18  ALT 17  ALKPHOS 60  BILITOT 0.5  PROT 6.5  ALBUMIN 3.9   CBG: No results for input(s): "GLUCAP" in the last 168 hours.  Discharge time spent: greater than 30 minutes.  Signed: [name], MD [facility] 09/06/2021

## 2021-09-06 NOTE — TOC Transition Note (Signed)
Transition of Care [facility]) - CM/SW Discharge Note   Patient Details  Name: Kevin Wiley MRN: [id number] Date of Birth: 12/14/1940  [facility] CM/SW Contact:  Leeroy Cha, RN Phone Number: 09/06/2021, 10:51 AM   Clinical Narrative:    Patient dcd back to home with hhc through [place] ot and aide.   Final next level of care: [place] Barriers to Discharge: Barriers Resolved   Patient Goals and CMS Choice Patient states their goals for this hospitalization and ongoing recovery are:: to go home CMS Medicare.gov Compare Post Acute Care list provided to:: Patient Choice offered to / list presented to : Patient  Discharge Placement                       Discharge Plan and Services   Discharge Planning Services: CM Consult                                 Social Determinants of Health (SDOH) Interventions     Readmission Risk Interventions     No data to display

## 2021-09-10 DIAGNOSIS — I10 Essential (primary) hypertension: Secondary | ICD-10-CM | POA: Diagnosis not present

## 2021-09-10 DIAGNOSIS — I482 Chronic atrial fibrillation, unspecified: Secondary | ICD-10-CM | POA: Diagnosis not present

## 2021-09-10 DIAGNOSIS — M545 Low back pain, unspecified: Secondary | ICD-10-CM | POA: Diagnosis not present

## 2021-09-10 DIAGNOSIS — F4310 Post-traumatic stress disorder, unspecified: Secondary | ICD-10-CM | POA: Diagnosis not present

## 2021-09-10 DIAGNOSIS — D649 Anemia, unspecified: Secondary | ICD-10-CM | POA: Diagnosis not present

## 2021-09-10 DIAGNOSIS — Z7901 Long term (current) use of anticoagulants: Secondary | ICD-10-CM | POA: Diagnosis not present

## 2021-09-10 DIAGNOSIS — Z96611 Presence of right artificial shoulder joint: Secondary | ICD-10-CM | POA: Diagnosis not present

## 2021-09-10 DIAGNOSIS — F40240 Claustrophobia: Secondary | ICD-10-CM | POA: Diagnosis not present

## 2021-09-10 DIAGNOSIS — F1090 Alcohol use, unspecified, uncomplicated: Secondary | ICD-10-CM | POA: Diagnosis not present

## 2021-09-10 DIAGNOSIS — F32A Depression, unspecified: Secondary | ICD-10-CM | POA: Diagnosis not present

## 2021-09-10 DIAGNOSIS — K219 Gastro-esophageal reflux disease without esophagitis: Secondary | ICD-10-CM | POA: Diagnosis not present

## 2021-09-10 DIAGNOSIS — Z7982 Long term (current) use of aspirin: Secondary | ICD-10-CM | POA: Diagnosis not present

## 2021-09-10 DIAGNOSIS — Z951 Presence of aortocoronary bypass graft: Secondary | ICD-10-CM | POA: Diagnosis not present

## 2021-09-10 DIAGNOSIS — N390 Urinary tract infection, site not specified: Secondary | ICD-10-CM | POA: Diagnosis not present

## 2021-09-10 DIAGNOSIS — Z950 Presence of cardiac pacemaker: Secondary | ICD-10-CM | POA: Diagnosis not present

## 2021-09-10 DIAGNOSIS — F419 Anxiety disorder, unspecified: Secondary | ICD-10-CM | POA: Diagnosis not present

## 2021-09-10 DIAGNOSIS — I2510 Atherosclerotic heart disease of native coronary artery without angina pectoris: Secondary | ICD-10-CM | POA: Diagnosis not present

## 2021-09-10 DIAGNOSIS — N400 Enlarged prostate without lower urinary tract symptoms: Secondary | ICD-10-CM | POA: Diagnosis not present

## 2021-09-10 DIAGNOSIS — I714 Abdominal aortic aneurysm, without rupture, unspecified: Secondary | ICD-10-CM | POA: Diagnosis not present

## 2021-09-10 DIAGNOSIS — E538 Deficiency of other specified B group vitamins: Secondary | ICD-10-CM | POA: Diagnosis not present

## 2021-09-10 DIAGNOSIS — K5900 Constipation, unspecified: Secondary | ICD-10-CM | POA: Diagnosis not present

## 2021-09-10 DIAGNOSIS — E785 Hyperlipidemia, unspecified: Secondary | ICD-10-CM | POA: Diagnosis not present

## 2021-09-10 DIAGNOSIS — I739 Peripheral vascular disease, unspecified: Secondary | ICD-10-CM | POA: Diagnosis not present

## 2021-09-10 DIAGNOSIS — M1990 Unspecified osteoarthritis, unspecified site: Secondary | ICD-10-CM | POA: Diagnosis not present

## 2021-09-10 DIAGNOSIS — G4730 Sleep apnea, unspecified: Secondary | ICD-10-CM | POA: Diagnosis not present

## 2021-09-14 DIAGNOSIS — I482 Chronic atrial fibrillation, unspecified: Secondary | ICD-10-CM | POA: Diagnosis not present

## 2021-09-14 DIAGNOSIS — I714 Abdominal aortic aneurysm, without rupture, unspecified: Secondary | ICD-10-CM | POA: Diagnosis not present

## 2021-09-14 DIAGNOSIS — I2510 Atherosclerotic heart disease of native coronary artery without angina pectoris: Secondary | ICD-10-CM | POA: Diagnosis not present

## 2021-09-14 DIAGNOSIS — N390 Urinary tract infection, site not specified: Secondary | ICD-10-CM | POA: Diagnosis not present

## 2021-09-14 DIAGNOSIS — I739 Peripheral vascular disease, unspecified: Secondary | ICD-10-CM | POA: Diagnosis not present

## 2021-09-14 DIAGNOSIS — I10 Essential (primary) hypertension: Secondary | ICD-10-CM | POA: Diagnosis not present

## 2021-09-15 ENCOUNTER — Encounter (HOSPITAL_COMMUNITY): Payer: Self-pay

## 2021-09-15 ENCOUNTER — Other Ambulatory Visit: Payer: Self-pay

## 2021-09-15 ENCOUNTER — Emergency Department (HOSPITAL_COMMUNITY)
Admission: EM | Admit: 2021-09-15 | Discharge: 2021-09-15 | Disposition: A | Discharge status: Home / Self Care | Payer: Medicare Other | Attending: Emergency Medicine | Admitting: Emergency Medicine

## 2021-09-15 ENCOUNTER — Emergency Department (HOSPITAL_COMMUNITY): Payer: Medicare Other

## 2021-09-15 DIAGNOSIS — I2510 Atherosclerotic heart disease of native coronary artery without angina pectoris: Secondary | ICD-10-CM | POA: Insufficient documentation

## 2021-09-15 DIAGNOSIS — R079 Chest pain, unspecified: Secondary | ICD-10-CM | POA: Diagnosis not present

## 2021-09-15 DIAGNOSIS — I10 Essential (primary) hypertension: Secondary | ICD-10-CM | POA: Insufficient documentation

## 2021-09-15 DIAGNOSIS — K6389 Other specified diseases of intestine: Secondary | ICD-10-CM | POA: Diagnosis not present

## 2021-09-15 DIAGNOSIS — Z743 Need for continuous supervision: Secondary | ICD-10-CM | POA: Diagnosis not present

## 2021-09-15 DIAGNOSIS — R0789 Other chest pain: Secondary | ICD-10-CM | POA: Diagnosis not present

## 2021-09-15 DIAGNOSIS — I700 Atherosclerosis of aorta: Secondary | ICD-10-CM | POA: Diagnosis not present

## 2021-09-15 DIAGNOSIS — Z79899 Other long term (current) drug therapy: Secondary | ICD-10-CM | POA: Insufficient documentation

## 2021-09-15 DIAGNOSIS — F0390 Unspecified dementia without behavioral disturbance: Secondary | ICD-10-CM | POA: Insufficient documentation

## 2021-09-15 DIAGNOSIS — Z87891 Personal history of nicotine dependence: Secondary | ICD-10-CM | POA: Insufficient documentation

## 2021-09-15 DIAGNOSIS — K5792 Diverticulitis of intestine, part unspecified, without perforation or abscess without bleeding: Secondary | ICD-10-CM | POA: Diagnosis not present

## 2021-09-15 DIAGNOSIS — R531 Weakness: Secondary | ICD-10-CM | POA: Diagnosis not present

## 2021-09-15 DIAGNOSIS — R110 Nausea: Secondary | ICD-10-CM | POA: Diagnosis not present

## 2021-09-15 DIAGNOSIS — Z7982 Long term (current) use of aspirin: Secondary | ICD-10-CM | POA: Diagnosis not present

## 2021-09-15 DIAGNOSIS — Z955 Presence of coronary angioplasty implant and graft: Secondary | ICD-10-CM | POA: Diagnosis not present

## 2021-09-15 DIAGNOSIS — Z950 Presence of cardiac pacemaker: Secondary | ICD-10-CM | POA: Insufficient documentation

## 2021-09-15 DIAGNOSIS — M549 Dorsalgia, unspecified: Secondary | ICD-10-CM | POA: Diagnosis not present

## 2021-09-15 DIAGNOSIS — K5732 Diverticulitis of large intestine without perforation or abscess without bleeding: Secondary | ICD-10-CM | POA: Diagnosis not present

## 2021-09-15 HISTORY — DX: Unspecified dementia, unspecified severity, without behavioral disturbance, psychotic disturbance, mood disturbance, and anxiety: F03.90

## 2021-09-15 LAB — CBC WITH DIFFERENTIAL/PLATELET
Abs Immature Granulocytes: 0.04 10*3/uL (ref 0.00–0.07)
Basophils Absolute: 0 10*3/uL (ref 0.0–0.1)
Basophils Relative: 0 %
Eosinophils Absolute: 0.2 10*3/uL (ref 0.0–0.5)
Eosinophils Relative: 2 %
HCT: 40 % (ref 39.0–52.0)
Hemoglobin: 13.8 g/dL (ref 13.0–17.0)
Immature Granulocytes: 0 %
Lymphocytes Relative: 17 %
Lymphs Abs: 1.7 10*3/uL (ref 0.7–4.0)
MCH: 32.5 pg (ref 26.0–34.0)
MCHC: 34.5 g/dL (ref 30.0–36.0)
MCV: 94.3 fL (ref 80.0–100.0)
Monocytes Absolute: 0.7 10*3/uL (ref 0.1–1.0)
Monocytes Relative: 6 %
Neutro Abs: 7.6 10*3/uL (ref 1.7–7.7)
Neutrophils Relative %: 75 %
Platelets: 213 10*3/uL (ref 150–400)
RBC: 4.24 MIL/uL (ref 4.22–5.81)
RDW: 13.2 % (ref 11.5–15.5)
WBC: 10.2 10*3/uL (ref 4.0–10.5)
nRBC: 0 % (ref 0.0–0.2)

## 2021-09-15 LAB — HEPATIC FUNCTION PANEL
ALT: 15 U/L (ref 0–44)
AST: 17 U/L (ref 15–41)
Albumin: 3.6 g/dL (ref 3.5–5.0)
Alkaline Phosphatase: 54 U/L (ref 38–126)
Bilirubin, Direct: 0.1 mg/dL (ref 0.0–0.2)
Indirect Bilirubin: 0.7 mg/dL (ref 0.3–0.9)
Total Bilirubin: 0.8 mg/dL (ref 0.3–1.2)
Total Protein: 6 g/dL — ABNORMAL LOW (ref 6.5–8.1)

## 2021-09-15 LAB — BASIC METABOLIC PANEL
Anion gap: 6 (ref 5–15)
BUN: 21 mg/dL (ref 8–23)
CO2: 28 mmol/L (ref 22–32)
Calcium: 9.3 mg/dL (ref 8.9–10.3)
Chloride: 108 mmol/L (ref 98–111)
Creatinine, Ser: 1.1 mg/dL (ref 0.61–1.24)
GFR, Estimated: >60 mL/min (ref >60)
Glucose, Bld: 104 mg/dL — ABNORMAL HIGH (ref 70–99)
Potassium: 4.1 mmol/L (ref 3.5–5.1)
Sodium: 142 mmol/L (ref 135–145)

## 2021-09-15 LAB — URINALYSIS, ROUTINE W REFLEX MICROSCOPIC
Bacteria, UA: NONE SEEN
Bilirubin Urine: NEGATIVE
Glucose, UA: NEGATIVE mg/dL
Ketones, ur: NEGATIVE mg/dL
Leukocytes,Ua: NEGATIVE
Nitrite: NEGATIVE
Protein, ur: NEGATIVE mg/dL
Specific Gravity, Urine: 1.017 (ref 1.005–1.030)
pH: 7 (ref 5.0–8.0)

## 2021-09-15 LAB — TROPONIN I (HIGH SENSITIVITY)
Troponin I (High Sensitivity): 11 ng/L (ref <18)
Troponin I (High Sensitivity): 12 ng/L (ref <18)

## 2021-09-15 LAB — LIPASE, BLOOD: Lipase: 29 U/L (ref 11–51)

## 2021-09-15 MED ORDER — ATENOLOL 100 MG PO TABS
100.0000 mg | ORAL_TABLET | Freq: Once | ORAL | Status: AC
  Administered 2021-09-15: 100 mg via ORAL
  Filled 2021-09-15: qty 1

## 2021-09-15 MED ORDER — AMOXICILLIN-POT CLAVULANATE 875-125 MG PO TABS: 1.0000 | ORAL_TABLET | Freq: Two times a day (BID) | ORAL | 0 refills | Status: DC

## 2021-09-15 MED ORDER — HYDRALAZINE HCL 25 MG PO TABS
50.0000 mg | ORAL_TABLET | Freq: Once | ORAL | Status: AC
  Administered 2021-09-15: 50 mg via ORAL
  Filled 2021-09-15: qty 2

## 2021-09-15 MED ORDER — ONDANSETRON HCL 4 MG/2ML IJ SOLN
4.0000 mg | Freq: Once | INTRAMUSCULAR | Status: AC
  Administered 2021-09-15: 4 mg via INTRAVENOUS
  Filled 2021-09-15: qty 2

## 2021-09-15 MED ORDER — IOHEXOL 300 MG/ML  SOLN
100.0000 mL | Freq: Once | INTRAMUSCULAR | Status: AC | PRN
Start: 2021-09-15 — End: 2021-09-15
  Administered 2021-09-15: 100 mL via INTRAVENOUS
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
## 2021-09-15 NOTE — ED Notes (Signed)
[facility] called  

## 2021-09-15 NOTE — ED Provider Notes (Signed)
  Physical Exam  BP (!) 141/94   Pulse (!) 59   Temp 98.4 F (36.9 C) (Oral)   Resp 18   SpO2 100%   Physical Exam Vitals and nursing note reviewed.  Constitutional:      General: He is not in acute distress.    Appearance: Normal appearance.  HENT:     Mouth/Throat:     Mouth: Mucous membranes are moist.  Cardiovascular:     Rate and Rhythm: Normal rate and regular rhythm.  Pulmonary:     Effort: Pulmonary effort is normal. No respiratory distress.     Breath sounds: Normal breath sounds.  Abdominal:     General: Abdomen is flat.     Palpations: Abdomen is soft.     Tenderness: There is abdominal tenderness (mild diffuse tenderness).  Skin:    General: Skin is warm and dry.     Capillary Refill: Capillary refill takes less than 2 seconds.  Neurological:     Mental Status: He is alert and oriented to person, place, and time. Mental status is at baseline.  Psychiatric:        Mood and Affect: Mood normal.        Behavior: Behavior normal.     Procedures  Procedures  ED Course / MDM   Clinical Course as of 09/15/21 1225  Sat Sep 15, 2021  0704 Received sign out pending labs, repeat troponin, and re-assessment. [WS]  1025 Patient re-assessed after second negative troponin. Complaining of vague abdominal pain, not able to fully describe, no chest pain. Reports some epigastric tenderness. Unclear if this is new or not. Doubt acute process but given age will perform CT scan [WS]  1221 CT scan with acute diverticulitis. Given age, will treat with augmentin. Will discharge back to facility.  [WS]    Clinical Course User Index [WS] Cristie Hem, MD   Medical Decision Making Amount and/or Complexity of Data Reviewed Labs: ordered. Radiology: ordered. ECG/medicine tests: ordered.  Risk Prescription drug management.      Cristie Hem, MD 09/15/21 1226

## 2021-09-15 NOTE — ED Triage Notes (Signed)
Pt BIB [facility] from home with reports of right flank pain and chest pressure.

## 2021-09-19 DIAGNOSIS — I714 Abdominal aortic aneurysm, without rupture, unspecified: Secondary | ICD-10-CM | POA: Diagnosis not present

## 2021-09-19 DIAGNOSIS — I739 Peripheral vascular disease, unspecified: Secondary | ICD-10-CM | POA: Diagnosis not present

## 2021-09-19 DIAGNOSIS — N390 Urinary tract infection, site not specified: Secondary | ICD-10-CM | POA: Diagnosis not present

## 2021-09-19 DIAGNOSIS — I10 Essential (primary) hypertension: Secondary | ICD-10-CM | POA: Diagnosis not present

## 2021-09-19 DIAGNOSIS — I482 Chronic atrial fibrillation, unspecified: Secondary | ICD-10-CM | POA: Diagnosis not present

## 2021-09-19 DIAGNOSIS — I2510 Atherosclerotic heart disease of native coronary artery without angina pectoris: Secondary | ICD-10-CM | POA: Diagnosis not present

## 2021-09-25 DIAGNOSIS — I482 Chronic atrial fibrillation, unspecified: Secondary | ICD-10-CM | POA: Diagnosis not present

## 2021-09-25 DIAGNOSIS — I10 Essential (primary) hypertension: Secondary | ICD-10-CM | POA: Diagnosis not present

## 2021-09-25 DIAGNOSIS — I739 Peripheral vascular disease, unspecified: Secondary | ICD-10-CM | POA: Diagnosis not present

## 2021-09-25 DIAGNOSIS — N390 Urinary tract infection, site not specified: Secondary | ICD-10-CM | POA: Diagnosis not present

## 2021-09-25 DIAGNOSIS — I2510 Atherosclerotic heart disease of native coronary artery without angina pectoris: Secondary | ICD-10-CM | POA: Diagnosis not present

## 2021-09-25 DIAGNOSIS — I714 Abdominal aortic aneurysm, without rupture, unspecified: Secondary | ICD-10-CM | POA: Diagnosis not present

## 2021-09-28 DIAGNOSIS — I714 Abdominal aortic aneurysm, without rupture, unspecified: Secondary | ICD-10-CM | POA: Diagnosis not present

## 2021-09-28 DIAGNOSIS — I482 Chronic atrial fibrillation, unspecified: Secondary | ICD-10-CM | POA: Diagnosis not present

## 2021-09-28 DIAGNOSIS — I2510 Atherosclerotic heart disease of native coronary artery without angina pectoris: Secondary | ICD-10-CM | POA: Diagnosis not present

## 2021-09-28 DIAGNOSIS — I10 Essential (primary) hypertension: Secondary | ICD-10-CM | POA: Diagnosis not present

## 2021-09-28 DIAGNOSIS — I739 Peripheral vascular disease, unspecified: Secondary | ICD-10-CM | POA: Diagnosis not present

## 2021-09-28 DIAGNOSIS — N390 Urinary tract infection, site not specified: Secondary | ICD-10-CM | POA: Diagnosis not present

## 2021-10-01 DIAGNOSIS — I714 Abdominal aortic aneurysm, without rupture, unspecified: Secondary | ICD-10-CM | POA: Diagnosis not present

## 2021-10-01 DIAGNOSIS — N390 Urinary tract infection, site not specified: Secondary | ICD-10-CM | POA: Diagnosis not present

## 2021-10-01 DIAGNOSIS — I10 Essential (primary) hypertension: Secondary | ICD-10-CM | POA: Diagnosis not present

## 2021-10-01 DIAGNOSIS — I2510 Atherosclerotic heart disease of native coronary artery without angina pectoris: Secondary | ICD-10-CM | POA: Diagnosis not present

## 2021-10-01 DIAGNOSIS — I739 Peripheral vascular disease, unspecified: Secondary | ICD-10-CM | POA: Diagnosis not present

## 2021-10-01 DIAGNOSIS — I482 Chronic atrial fibrillation, unspecified: Secondary | ICD-10-CM | POA: Diagnosis not present

## 2021-10-05 DIAGNOSIS — I10 Essential (primary) hypertension: Secondary | ICD-10-CM | POA: Diagnosis not present

## 2021-10-05 DIAGNOSIS — I714 Abdominal aortic aneurysm, without rupture, unspecified: Secondary | ICD-10-CM | POA: Diagnosis not present

## 2021-10-05 DIAGNOSIS — N390 Urinary tract infection, site not specified: Secondary | ICD-10-CM | POA: Diagnosis not present

## 2021-10-05 DIAGNOSIS — I482 Chronic atrial fibrillation, unspecified: Secondary | ICD-10-CM | POA: Diagnosis not present

## 2021-10-05 DIAGNOSIS — I739 Peripheral vascular disease, unspecified: Secondary | ICD-10-CM | POA: Diagnosis not present

## 2021-10-05 DIAGNOSIS — I2510 Atherosclerotic heart disease of native coronary artery without angina pectoris: Secondary | ICD-10-CM | POA: Diagnosis not present

## 2021-10-08 DIAGNOSIS — I482 Chronic atrial fibrillation, unspecified: Secondary | ICD-10-CM | POA: Diagnosis not present

## 2021-10-08 DIAGNOSIS — I714 Abdominal aortic aneurysm, without rupture, unspecified: Secondary | ICD-10-CM | POA: Diagnosis not present

## 2021-10-08 DIAGNOSIS — N390 Urinary tract infection, site not specified: Secondary | ICD-10-CM | POA: Diagnosis not present

## 2021-10-08 DIAGNOSIS — I2510 Atherosclerotic heart disease of native coronary artery without angina pectoris: Secondary | ICD-10-CM | POA: Diagnosis not present

## 2021-10-08 DIAGNOSIS — I739 Peripheral vascular disease, unspecified: Secondary | ICD-10-CM | POA: Diagnosis not present

## 2021-10-08 DIAGNOSIS — I10 Essential (primary) hypertension: Secondary | ICD-10-CM | POA: Diagnosis not present

## 2021-10-09 DIAGNOSIS — I482 Chronic atrial fibrillation, unspecified: Secondary | ICD-10-CM | POA: Diagnosis not present

## 2021-10-09 DIAGNOSIS — I714 Abdominal aortic aneurysm, without rupture, unspecified: Secondary | ICD-10-CM | POA: Diagnosis not present

## 2021-10-09 DIAGNOSIS — I739 Peripheral vascular disease, unspecified: Secondary | ICD-10-CM | POA: Diagnosis not present

## 2021-10-09 DIAGNOSIS — I10 Essential (primary) hypertension: Secondary | ICD-10-CM | POA: Diagnosis not present

## 2021-10-09 DIAGNOSIS — N390 Urinary tract infection, site not specified: Secondary | ICD-10-CM | POA: Diagnosis not present

## 2021-10-09 DIAGNOSIS — I2510 Atherosclerotic heart disease of native coronary artery without angina pectoris: Secondary | ICD-10-CM | POA: Diagnosis not present

## 2021-10-10 DIAGNOSIS — F419 Anxiety disorder, unspecified: Secondary | ICD-10-CM | POA: Diagnosis not present

## 2021-10-10 DIAGNOSIS — Z951 Presence of aortocoronary bypass graft: Secondary | ICD-10-CM | POA: Diagnosis not present

## 2021-10-10 DIAGNOSIS — M1990 Unspecified osteoarthritis, unspecified site: Secondary | ICD-10-CM | POA: Diagnosis not present

## 2021-10-10 DIAGNOSIS — I2510 Atherosclerotic heart disease of native coronary artery without angina pectoris: Secondary | ICD-10-CM | POA: Diagnosis not present

## 2021-10-10 DIAGNOSIS — I10 Essential (primary) hypertension: Secondary | ICD-10-CM | POA: Diagnosis not present

## 2021-10-10 DIAGNOSIS — E785 Hyperlipidemia, unspecified: Secondary | ICD-10-CM | POA: Diagnosis not present

## 2021-10-10 DIAGNOSIS — Z96611 Presence of right artificial shoulder joint: Secondary | ICD-10-CM | POA: Diagnosis not present

## 2021-10-10 DIAGNOSIS — F32A Depression, unspecified: Secondary | ICD-10-CM | POA: Diagnosis not present

## 2021-10-10 DIAGNOSIS — K219 Gastro-esophageal reflux disease without esophagitis: Secondary | ICD-10-CM | POA: Diagnosis not present

## 2021-10-10 DIAGNOSIS — Z7982 Long term (current) use of aspirin: Secondary | ICD-10-CM | POA: Diagnosis not present

## 2021-10-10 DIAGNOSIS — F1090 Alcohol use, unspecified, uncomplicated: Secondary | ICD-10-CM | POA: Diagnosis not present

## 2021-10-10 DIAGNOSIS — I482 Chronic atrial fibrillation, unspecified: Secondary | ICD-10-CM | POA: Diagnosis not present

## 2021-10-10 DIAGNOSIS — I739 Peripheral vascular disease, unspecified: Secondary | ICD-10-CM | POA: Diagnosis not present

## 2021-10-10 DIAGNOSIS — G4730 Sleep apnea, unspecified: Secondary | ICD-10-CM | POA: Diagnosis not present

## 2021-10-10 DIAGNOSIS — M545 Low back pain, unspecified: Secondary | ICD-10-CM | POA: Diagnosis not present

## 2021-10-10 DIAGNOSIS — F40240 Claustrophobia: Secondary | ICD-10-CM | POA: Diagnosis not present

## 2021-10-10 DIAGNOSIS — F4310 Post-traumatic stress disorder, unspecified: Secondary | ICD-10-CM | POA: Diagnosis not present

## 2021-10-10 DIAGNOSIS — E538 Deficiency of other specified B group vitamins: Secondary | ICD-10-CM | POA: Diagnosis not present

## 2021-10-10 DIAGNOSIS — I714 Abdominal aortic aneurysm, without rupture, unspecified: Secondary | ICD-10-CM | POA: Diagnosis not present

## 2021-10-10 DIAGNOSIS — N400 Enlarged prostate without lower urinary tract symptoms: Secondary | ICD-10-CM | POA: Diagnosis not present

## 2021-10-10 DIAGNOSIS — N390 Urinary tract infection, site not specified: Secondary | ICD-10-CM | POA: Diagnosis not present

## 2021-10-10 DIAGNOSIS — Z950 Presence of cardiac pacemaker: Secondary | ICD-10-CM | POA: Diagnosis not present

## 2021-10-10 DIAGNOSIS — D649 Anemia, unspecified: Secondary | ICD-10-CM | POA: Diagnosis not present

## 2021-10-10 DIAGNOSIS — Z7901 Long term (current) use of anticoagulants: Secondary | ICD-10-CM | POA: Diagnosis not present

## 2021-10-10 DIAGNOSIS — K5900 Constipation, unspecified: Secondary | ICD-10-CM | POA: Diagnosis not present

## 2021-10-17 DIAGNOSIS — D692 Other nonthrombocytopenic purpura: Secondary | ICD-10-CM | POA: Diagnosis not present

## 2021-10-17 DIAGNOSIS — I700 Atherosclerosis of aorta: Secondary | ICD-10-CM | POA: Diagnosis not present

## 2021-10-17 DIAGNOSIS — I701 Atherosclerosis of renal artery: Secondary | ICD-10-CM | POA: Diagnosis not present

## 2021-10-17 DIAGNOSIS — I10 Essential (primary) hypertension: Secondary | ICD-10-CM | POA: Diagnosis not present

## 2021-10-17 DIAGNOSIS — Z950 Presence of cardiac pacemaker: Secondary | ICD-10-CM | POA: Diagnosis not present

## 2021-10-17 DIAGNOSIS — E785 Hyperlipidemia, unspecified: Secondary | ICD-10-CM | POA: Diagnosis not present

## 2021-10-17 DIAGNOSIS — I482 Chronic atrial fibrillation, unspecified: Secondary | ICD-10-CM | POA: Diagnosis not present

## 2021-10-17 DIAGNOSIS — I739 Peripheral vascular disease, unspecified: Secondary | ICD-10-CM | POA: Diagnosis not present

## 2021-10-17 DIAGNOSIS — F4310 Post-traumatic stress disorder, unspecified: Secondary | ICD-10-CM | POA: Diagnosis not present

## 2021-10-17 DIAGNOSIS — R601 Generalized edema: Secondary | ICD-10-CM | POA: Diagnosis not present

## 2021-10-17 DIAGNOSIS — I2510 Atherosclerotic heart disease of native coronary artery without angina pectoris: Secondary | ICD-10-CM | POA: Diagnosis not present

## 2021-10-17 DIAGNOSIS — K5010 Crohn's disease of large intestine without complications: Secondary | ICD-10-CM | POA: Diagnosis not present

## 2021-10-19 DIAGNOSIS — I739 Peripheral vascular disease, unspecified: Secondary | ICD-10-CM | POA: Diagnosis not present

## 2021-10-19 DIAGNOSIS — I10 Essential (primary) hypertension: Secondary | ICD-10-CM | POA: Diagnosis not present

## 2021-10-19 DIAGNOSIS — I482 Chronic atrial fibrillation, unspecified: Secondary | ICD-10-CM | POA: Diagnosis not present

## 2021-10-19 DIAGNOSIS — I2510 Atherosclerotic heart disease of native coronary artery without angina pectoris: Secondary | ICD-10-CM | POA: Diagnosis not present

## 2021-10-19 DIAGNOSIS — N390 Urinary tract infection, site not specified: Secondary | ICD-10-CM | POA: Diagnosis not present

## 2021-10-19 DIAGNOSIS — I714 Abdominal aortic aneurysm, without rupture, unspecified: Secondary | ICD-10-CM | POA: Diagnosis not present

## 2021-10-23 DIAGNOSIS — I482 Chronic atrial fibrillation, unspecified: Secondary | ICD-10-CM | POA: Diagnosis not present

## 2021-10-23 DIAGNOSIS — N390 Urinary tract infection, site not specified: Secondary | ICD-10-CM | POA: Diagnosis not present

## 2021-10-23 DIAGNOSIS — I714 Abdominal aortic aneurysm, without rupture, unspecified: Secondary | ICD-10-CM | POA: Diagnosis not present

## 2021-10-23 DIAGNOSIS — I2510 Atherosclerotic heart disease of native coronary artery without angina pectoris: Secondary | ICD-10-CM | POA: Diagnosis not present

## 2021-10-23 DIAGNOSIS — I739 Peripheral vascular disease, unspecified: Secondary | ICD-10-CM | POA: Diagnosis not present

## 2021-10-23 DIAGNOSIS — I10 Essential (primary) hypertension: Secondary | ICD-10-CM | POA: Diagnosis not present

## 2021-10-24 DIAGNOSIS — I2510 Atherosclerotic heart disease of native coronary artery without angina pectoris: Secondary | ICD-10-CM | POA: Diagnosis not present

## 2021-10-24 DIAGNOSIS — N390 Urinary tract infection, site not specified: Secondary | ICD-10-CM | POA: Diagnosis not present

## 2021-10-24 DIAGNOSIS — I739 Peripheral vascular disease, unspecified: Secondary | ICD-10-CM | POA: Diagnosis not present

## 2021-10-24 DIAGNOSIS — I10 Essential (primary) hypertension: Secondary | ICD-10-CM | POA: Diagnosis not present

## 2021-10-24 DIAGNOSIS — I714 Abdominal aortic aneurysm, without rupture, unspecified: Secondary | ICD-10-CM | POA: Diagnosis not present

## 2021-10-24 DIAGNOSIS — I482 Chronic atrial fibrillation, unspecified: Secondary | ICD-10-CM | POA: Diagnosis not present

## 2021-10-26 DIAGNOSIS — I714 Abdominal aortic aneurysm, without rupture, unspecified: Secondary | ICD-10-CM | POA: Diagnosis not present

## 2021-10-26 DIAGNOSIS — I482 Chronic atrial fibrillation, unspecified: Secondary | ICD-10-CM | POA: Diagnosis not present

## 2021-10-26 DIAGNOSIS — N390 Urinary tract infection, site not specified: Secondary | ICD-10-CM | POA: Diagnosis not present

## 2021-10-26 DIAGNOSIS — I10 Essential (primary) hypertension: Secondary | ICD-10-CM | POA: Diagnosis not present

## 2021-10-26 DIAGNOSIS — I739 Peripheral vascular disease, unspecified: Secondary | ICD-10-CM | POA: Diagnosis not present

## 2021-10-26 DIAGNOSIS — I2510 Atherosclerotic heart disease of native coronary artery without angina pectoris: Secondary | ICD-10-CM | POA: Diagnosis not present

## 2021-11-01 DIAGNOSIS — I714 Abdominal aortic aneurysm, without rupture, unspecified: Secondary | ICD-10-CM | POA: Diagnosis not present

## 2021-11-01 DIAGNOSIS — I739 Peripheral vascular disease, unspecified: Secondary | ICD-10-CM | POA: Diagnosis not present

## 2021-11-01 DIAGNOSIS — I2510 Atherosclerotic heart disease of native coronary artery without angina pectoris: Secondary | ICD-10-CM | POA: Diagnosis not present

## 2021-11-01 DIAGNOSIS — I10 Essential (primary) hypertension: Secondary | ICD-10-CM | POA: Diagnosis not present

## 2021-11-01 DIAGNOSIS — N390 Urinary tract infection, site not specified: Secondary | ICD-10-CM | POA: Diagnosis not present

## 2021-11-01 DIAGNOSIS — I482 Chronic atrial fibrillation, unspecified: Secondary | ICD-10-CM | POA: Diagnosis not present

## 2021-11-02 DIAGNOSIS — I739 Peripheral vascular disease, unspecified: Secondary | ICD-10-CM | POA: Diagnosis not present

## 2021-11-02 DIAGNOSIS — I482 Chronic atrial fibrillation, unspecified: Secondary | ICD-10-CM | POA: Diagnosis not present

## 2021-11-02 DIAGNOSIS — I10 Essential (primary) hypertension: Secondary | ICD-10-CM | POA: Diagnosis not present

## 2021-11-02 DIAGNOSIS — I714 Abdominal aortic aneurysm, without rupture, unspecified: Secondary | ICD-10-CM | POA: Diagnosis not present

## 2021-11-02 DIAGNOSIS — N390 Urinary tract infection, site not specified: Secondary | ICD-10-CM | POA: Diagnosis not present

## 2021-11-02 DIAGNOSIS — I2510 Atherosclerotic heart disease of native coronary artery without angina pectoris: Secondary | ICD-10-CM | POA: Diagnosis not present

## 2021-11-06 DIAGNOSIS — I482 Chronic atrial fibrillation, unspecified: Secondary | ICD-10-CM | POA: Diagnosis not present

## 2021-11-06 DIAGNOSIS — N390 Urinary tract infection, site not specified: Secondary | ICD-10-CM | POA: Diagnosis not present

## 2021-11-06 DIAGNOSIS — I714 Abdominal aortic aneurysm, without rupture, unspecified: Secondary | ICD-10-CM | POA: Diagnosis not present

## 2021-11-06 DIAGNOSIS — I739 Peripheral vascular disease, unspecified: Secondary | ICD-10-CM | POA: Diagnosis not present

## 2021-11-06 DIAGNOSIS — I10 Essential (primary) hypertension: Secondary | ICD-10-CM | POA: Diagnosis not present

## 2021-11-06 DIAGNOSIS — I2510 Atherosclerotic heart disease of native coronary artery without angina pectoris: Secondary | ICD-10-CM | POA: Diagnosis not present

## 2021-11-13 DIAGNOSIS — Z741 Need for assistance with personal care: Secondary | ICD-10-CM | POA: Diagnosis not present

## 2021-11-13 DIAGNOSIS — Z23 Encounter for immunization: Secondary | ICD-10-CM | POA: Diagnosis not present

## 2021-11-13 DIAGNOSIS — R2681 Unsteadiness on feet: Secondary | ICD-10-CM | POA: Diagnosis not present

## 2021-11-13 DIAGNOSIS — Z022 Encounter for examination for admission to residential institution: Secondary | ICD-10-CM | POA: Diagnosis not present

## 2021-11-13 DIAGNOSIS — Z111 Encounter for screening for respiratory tuberculosis: Secondary | ICD-10-CM | POA: Diagnosis not present

## 2021-11-15 DIAGNOSIS — Z111 Encounter for screening for respiratory tuberculosis: Secondary | ICD-10-CM | POA: Diagnosis not present

## 2021-12-25 DIAGNOSIS — M6281 Muscle weakness (generalized): Secondary | ICD-10-CM | POA: Diagnosis not present

## 2021-12-25 DIAGNOSIS — I10 Essential (primary) hypertension: Secondary | ICD-10-CM | POA: Diagnosis not present

## 2021-12-25 DIAGNOSIS — K5900 Constipation, unspecified: Secondary | ICD-10-CM | POA: Diagnosis not present

## 2021-12-25 DIAGNOSIS — G4700 Insomnia, unspecified: Secondary | ICD-10-CM | POA: Diagnosis not present

## 2021-12-25 DIAGNOSIS — N401 Enlarged prostate with lower urinary tract symptoms: Secondary | ICD-10-CM | POA: Diagnosis not present

## 2021-12-25 DIAGNOSIS — R2689 Other abnormalities of gait and mobility: Secondary | ICD-10-CM | POA: Diagnosis not present

## 2021-12-25 DIAGNOSIS — K219 Gastro-esophageal reflux disease without esophagitis: Secondary | ICD-10-CM | POA: Diagnosis not present

## 2021-12-25 DIAGNOSIS — R279 Unspecified lack of coordination: Secondary | ICD-10-CM | POA: Diagnosis not present

## 2021-12-28 DIAGNOSIS — M6281 Muscle weakness (generalized): Secondary | ICD-10-CM | POA: Diagnosis not present

## 2021-12-28 DIAGNOSIS — R2689 Other abnormalities of gait and mobility: Secondary | ICD-10-CM | POA: Diagnosis not present

## 2021-12-28 DIAGNOSIS — R279 Unspecified lack of coordination: Secondary | ICD-10-CM | POA: Diagnosis not present

## 2022-01-01 DIAGNOSIS — N401 Enlarged prostate with lower urinary tract symptoms: Secondary | ICD-10-CM | POA: Diagnosis not present

## 2022-01-01 DIAGNOSIS — M1991 Primary osteoarthritis, unspecified site: Secondary | ICD-10-CM | POA: Diagnosis not present

## 2022-01-01 DIAGNOSIS — M6281 Muscle weakness (generalized): Secondary | ICD-10-CM | POA: Diagnosis not present

## 2022-01-01 DIAGNOSIS — I10 Essential (primary) hypertension: Secondary | ICD-10-CM | POA: Diagnosis not present

## 2022-01-01 DIAGNOSIS — R279 Unspecified lack of coordination: Secondary | ICD-10-CM | POA: Diagnosis not present

## 2022-01-01 DIAGNOSIS — R2689 Other abnormalities of gait and mobility: Secondary | ICD-10-CM | POA: Diagnosis not present

## 2022-01-01 DIAGNOSIS — N3281 Overactive bladder: Secondary | ICD-10-CM | POA: Diagnosis not present

## 2022-01-02 DIAGNOSIS — R279 Unspecified lack of coordination: Secondary | ICD-10-CM | POA: Diagnosis not present

## 2022-01-02 DIAGNOSIS — R2689 Other abnormalities of gait and mobility: Secondary | ICD-10-CM | POA: Diagnosis not present

## 2022-01-02 DIAGNOSIS — M6281 Muscle weakness (generalized): Secondary | ICD-10-CM | POA: Diagnosis not present

## 2022-01-03 DIAGNOSIS — R2689 Other abnormalities of gait and mobility: Secondary | ICD-10-CM | POA: Diagnosis not present

## 2022-01-03 DIAGNOSIS — M6281 Muscle weakness (generalized): Secondary | ICD-10-CM | POA: Diagnosis not present

## 2022-01-03 DIAGNOSIS — R279 Unspecified lack of coordination: Secondary | ICD-10-CM | POA: Diagnosis not present

## 2022-01-03 DIAGNOSIS — E559 Vitamin D deficiency, unspecified: Secondary | ICD-10-CM | POA: Diagnosis not present

## 2022-01-03 DIAGNOSIS — Z79899 Other long term (current) drug therapy: Secondary | ICD-10-CM | POA: Diagnosis not present

## 2022-01-04 DIAGNOSIS — R279 Unspecified lack of coordination: Secondary | ICD-10-CM | POA: Diagnosis not present

## 2022-01-04 DIAGNOSIS — M6281 Muscle weakness (generalized): Secondary | ICD-10-CM | POA: Diagnosis not present

## 2022-01-04 DIAGNOSIS — R2689 Other abnormalities of gait and mobility: Secondary | ICD-10-CM | POA: Diagnosis not present

## 2022-01-07 DIAGNOSIS — M6281 Muscle weakness (generalized): Secondary | ICD-10-CM | POA: Diagnosis not present

## 2022-01-07 DIAGNOSIS — R2689 Other abnormalities of gait and mobility: Secondary | ICD-10-CM | POA: Diagnosis not present

## 2022-01-07 DIAGNOSIS — R279 Unspecified lack of coordination: Secondary | ICD-10-CM | POA: Diagnosis not present

## 2022-01-08 DIAGNOSIS — R279 Unspecified lack of coordination: Secondary | ICD-10-CM | POA: Diagnosis not present

## 2022-01-08 DIAGNOSIS — M6281 Muscle weakness (generalized): Secondary | ICD-10-CM | POA: Diagnosis not present

## 2022-01-08 DIAGNOSIS — R2689 Other abnormalities of gait and mobility: Secondary | ICD-10-CM | POA: Diagnosis not present

## 2022-01-09 DIAGNOSIS — R279 Unspecified lack of coordination: Secondary | ICD-10-CM | POA: Diagnosis not present

## 2022-01-09 DIAGNOSIS — M6281 Muscle weakness (generalized): Secondary | ICD-10-CM | POA: Diagnosis not present

## 2022-01-09 DIAGNOSIS — R2689 Other abnormalities of gait and mobility: Secondary | ICD-10-CM | POA: Diagnosis not present

## 2022-01-11 DIAGNOSIS — M6281 Muscle weakness (generalized): Secondary | ICD-10-CM | POA: Diagnosis not present

## 2022-01-11 DIAGNOSIS — R2689 Other abnormalities of gait and mobility: Secondary | ICD-10-CM | POA: Diagnosis not present

## 2022-01-11 DIAGNOSIS — R279 Unspecified lack of coordination: Secondary | ICD-10-CM | POA: Diagnosis not present

## 2022-01-14 DIAGNOSIS — M6281 Muscle weakness (generalized): Secondary | ICD-10-CM | POA: Diagnosis not present

## 2022-01-14 DIAGNOSIS — R2689 Other abnormalities of gait and mobility: Secondary | ICD-10-CM | POA: Diagnosis not present

## 2022-01-14 DIAGNOSIS — R279 Unspecified lack of coordination: Secondary | ICD-10-CM | POA: Diagnosis not present

## 2022-01-15 DIAGNOSIS — R2689 Other abnormalities of gait and mobility: Secondary | ICD-10-CM | POA: Diagnosis not present

## 2022-01-15 DIAGNOSIS — M6281 Muscle weakness (generalized): Secondary | ICD-10-CM | POA: Diagnosis not present

## 2022-01-15 DIAGNOSIS — R279 Unspecified lack of coordination: Secondary | ICD-10-CM | POA: Diagnosis not present

## 2022-01-16 DIAGNOSIS — R279 Unspecified lack of coordination: Secondary | ICD-10-CM | POA: Diagnosis not present

## 2022-01-16 DIAGNOSIS — M6281 Muscle weakness (generalized): Secondary | ICD-10-CM | POA: Diagnosis not present

## 2022-01-16 DIAGNOSIS — R2689 Other abnormalities of gait and mobility: Secondary | ICD-10-CM | POA: Diagnosis not present

## 2022-01-17 DIAGNOSIS — R279 Unspecified lack of coordination: Secondary | ICD-10-CM | POA: Diagnosis not present

## 2022-01-17 DIAGNOSIS — M19011 Primary osteoarthritis, right shoulder: Secondary | ICD-10-CM | POA: Diagnosis not present

## 2022-01-17 DIAGNOSIS — R2689 Other abnormalities of gait and mobility: Secondary | ICD-10-CM | POA: Diagnosis not present

## 2022-01-17 DIAGNOSIS — M25511 Pain in right shoulder: Secondary | ICD-10-CM | POA: Diagnosis not present

## 2022-01-17 DIAGNOSIS — M6281 Muscle weakness (generalized): Secondary | ICD-10-CM | POA: Diagnosis not present

## 2022-01-18 DIAGNOSIS — R2689 Other abnormalities of gait and mobility: Secondary | ICD-10-CM | POA: Diagnosis not present

## 2022-01-18 DIAGNOSIS — M6281 Muscle weakness (generalized): Secondary | ICD-10-CM | POA: Diagnosis not present

## 2022-01-18 DIAGNOSIS — R279 Unspecified lack of coordination: Secondary | ICD-10-CM | POA: Diagnosis not present

## 2022-01-22 DIAGNOSIS — R2689 Other abnormalities of gait and mobility: Secondary | ICD-10-CM | POA: Diagnosis not present

## 2022-01-22 DIAGNOSIS — M6281 Muscle weakness (generalized): Secondary | ICD-10-CM | POA: Diagnosis not present

## 2022-01-22 DIAGNOSIS — R279 Unspecified lack of coordination: Secondary | ICD-10-CM | POA: Diagnosis not present

## 2022-01-23 DIAGNOSIS — M6281 Muscle weakness (generalized): Secondary | ICD-10-CM | POA: Diagnosis not present

## 2022-01-23 DIAGNOSIS — R2689 Other abnormalities of gait and mobility: Secondary | ICD-10-CM | POA: Diagnosis not present

## 2022-01-23 DIAGNOSIS — R279 Unspecified lack of coordination: Secondary | ICD-10-CM | POA: Diagnosis not present

## 2022-01-24 DIAGNOSIS — R2689 Other abnormalities of gait and mobility: Secondary | ICD-10-CM | POA: Diagnosis not present

## 2022-01-24 DIAGNOSIS — M6281 Muscle weakness (generalized): Secondary | ICD-10-CM | POA: Diagnosis not present

## 2022-01-24 DIAGNOSIS — R279 Unspecified lack of coordination: Secondary | ICD-10-CM | POA: Diagnosis not present

## 2022-01-26 DIAGNOSIS — R279 Unspecified lack of coordination: Secondary | ICD-10-CM | POA: Diagnosis not present

## 2022-01-26 DIAGNOSIS — M6281 Muscle weakness (generalized): Secondary | ICD-10-CM | POA: Diagnosis not present

## 2022-01-26 DIAGNOSIS — R2689 Other abnormalities of gait and mobility: Secondary | ICD-10-CM | POA: Diagnosis not present

## 2022-01-29 DIAGNOSIS — R279 Unspecified lack of coordination: Secondary | ICD-10-CM | POA: Diagnosis not present

## 2022-01-29 DIAGNOSIS — M6281 Muscle weakness (generalized): Secondary | ICD-10-CM | POA: Diagnosis not present

## 2022-01-29 DIAGNOSIS — R2689 Other abnormalities of gait and mobility: Secondary | ICD-10-CM | POA: Diagnosis not present

## 2022-01-31 DIAGNOSIS — R279 Unspecified lack of coordination: Secondary | ICD-10-CM | POA: Diagnosis not present

## 2022-01-31 DIAGNOSIS — M6281 Muscle weakness (generalized): Secondary | ICD-10-CM | POA: Diagnosis not present

## 2022-01-31 DIAGNOSIS — R2689 Other abnormalities of gait and mobility: Secondary | ICD-10-CM | POA: Diagnosis not present

## 2022-02-01 DIAGNOSIS — M6281 Muscle weakness (generalized): Secondary | ICD-10-CM | POA: Diagnosis not present

## 2022-02-01 DIAGNOSIS — R2689 Other abnormalities of gait and mobility: Secondary | ICD-10-CM | POA: Diagnosis not present

## 2022-02-01 DIAGNOSIS — R279 Unspecified lack of coordination: Secondary | ICD-10-CM | POA: Diagnosis not present

## 2022-02-05 DIAGNOSIS — M6281 Muscle weakness (generalized): Secondary | ICD-10-CM | POA: Diagnosis not present

## 2022-02-05 DIAGNOSIS — R279 Unspecified lack of coordination: Secondary | ICD-10-CM | POA: Diagnosis not present

## 2022-02-05 DIAGNOSIS — R2689 Other abnormalities of gait and mobility: Secondary | ICD-10-CM | POA: Diagnosis not present

## 2022-02-06 DIAGNOSIS — R279 Unspecified lack of coordination: Secondary | ICD-10-CM | POA: Diagnosis not present

## 2022-02-06 DIAGNOSIS — R2689 Other abnormalities of gait and mobility: Secondary | ICD-10-CM | POA: Diagnosis not present

## 2022-02-06 DIAGNOSIS — M6281 Muscle weakness (generalized): Secondary | ICD-10-CM | POA: Diagnosis not present

## 2022-02-06 DIAGNOSIS — M19011 Primary osteoarthritis, right shoulder: Secondary | ICD-10-CM | POA: Diagnosis not present

## 2022-02-07 DIAGNOSIS — R279 Unspecified lack of coordination: Secondary | ICD-10-CM | POA: Diagnosis not present

## 2022-02-07 DIAGNOSIS — M6281 Muscle weakness (generalized): Secondary | ICD-10-CM | POA: Diagnosis not present

## 2022-02-07 DIAGNOSIS — R2689 Other abnormalities of gait and mobility: Secondary | ICD-10-CM | POA: Diagnosis not present

## 2022-02-08 DIAGNOSIS — R279 Unspecified lack of coordination: Secondary | ICD-10-CM | POA: Diagnosis not present

## 2022-02-08 DIAGNOSIS — R2689 Other abnormalities of gait and mobility: Secondary | ICD-10-CM | POA: Diagnosis not present

## 2022-02-08 DIAGNOSIS — M6281 Muscle weakness (generalized): Secondary | ICD-10-CM | POA: Diagnosis not present

## 2022-02-12 DIAGNOSIS — M159 Polyosteoarthritis, unspecified: Secondary | ICD-10-CM | POA: Diagnosis not present

## 2022-02-12 DIAGNOSIS — E785 Hyperlipidemia, unspecified: Secondary | ICD-10-CM | POA: Diagnosis not present

## 2022-02-12 DIAGNOSIS — I10 Essential (primary) hypertension: Secondary | ICD-10-CM | POA: Diagnosis not present

## 2022-02-12 DIAGNOSIS — R279 Unspecified lack of coordination: Secondary | ICD-10-CM | POA: Diagnosis not present

## 2022-02-12 DIAGNOSIS — R2689 Other abnormalities of gait and mobility: Secondary | ICD-10-CM | POA: Diagnosis not present

## 2022-02-12 DIAGNOSIS — M6281 Muscle weakness (generalized): Secondary | ICD-10-CM | POA: Diagnosis not present

## 2022-02-14 DIAGNOSIS — R2689 Other abnormalities of gait and mobility: Secondary | ICD-10-CM | POA: Diagnosis not present

## 2022-02-14 DIAGNOSIS — M6281 Muscle weakness (generalized): Secondary | ICD-10-CM | POA: Diagnosis not present

## 2022-02-14 DIAGNOSIS — R279 Unspecified lack of coordination: Secondary | ICD-10-CM | POA: Diagnosis not present

## 2022-02-15 DIAGNOSIS — M6281 Muscle weakness (generalized): Secondary | ICD-10-CM | POA: Diagnosis not present

## 2022-02-15 DIAGNOSIS — R279 Unspecified lack of coordination: Secondary | ICD-10-CM | POA: Diagnosis not present

## 2022-02-15 DIAGNOSIS — R2689 Other abnormalities of gait and mobility: Secondary | ICD-10-CM | POA: Diagnosis not present

## 2022-02-18 DIAGNOSIS — M6281 Muscle weakness (generalized): Secondary | ICD-10-CM | POA: Diagnosis not present

## 2022-02-18 DIAGNOSIS — R279 Unspecified lack of coordination: Secondary | ICD-10-CM | POA: Diagnosis not present

## 2022-02-18 DIAGNOSIS — R2689 Other abnormalities of gait and mobility: Secondary | ICD-10-CM | POA: Diagnosis not present

## 2022-02-19 DIAGNOSIS — M6281 Muscle weakness (generalized): Secondary | ICD-10-CM | POA: Diagnosis not present

## 2022-02-19 DIAGNOSIS — R279 Unspecified lack of coordination: Secondary | ICD-10-CM | POA: Diagnosis not present

## 2022-02-19 DIAGNOSIS — R2689 Other abnormalities of gait and mobility: Secondary | ICD-10-CM | POA: Diagnosis not present

## 2022-02-20 DIAGNOSIS — R2689 Other abnormalities of gait and mobility: Secondary | ICD-10-CM | POA: Diagnosis not present

## 2022-02-20 DIAGNOSIS — M6281 Muscle weakness (generalized): Secondary | ICD-10-CM | POA: Diagnosis not present

## 2022-02-20 DIAGNOSIS — R279 Unspecified lack of coordination: Secondary | ICD-10-CM | POA: Diagnosis not present

## 2022-02-21 DIAGNOSIS — R2689 Other abnormalities of gait and mobility: Secondary | ICD-10-CM | POA: Diagnosis not present

## 2022-02-21 DIAGNOSIS — R279 Unspecified lack of coordination: Secondary | ICD-10-CM | POA: Diagnosis not present

## 2022-02-21 DIAGNOSIS — M6281 Muscle weakness (generalized): Secondary | ICD-10-CM | POA: Diagnosis not present

## 2022-02-22 DIAGNOSIS — R279 Unspecified lack of coordination: Secondary | ICD-10-CM | POA: Diagnosis not present

## 2022-02-22 DIAGNOSIS — M6281 Muscle weakness (generalized): Secondary | ICD-10-CM | POA: Diagnosis not present

## 2022-02-22 DIAGNOSIS — R2689 Other abnormalities of gait and mobility: Secondary | ICD-10-CM | POA: Diagnosis not present

## 2022-02-25 DIAGNOSIS — R2689 Other abnormalities of gait and mobility: Secondary | ICD-10-CM | POA: Diagnosis not present

## 2022-02-25 DIAGNOSIS — R279 Unspecified lack of coordination: Secondary | ICD-10-CM | POA: Diagnosis not present

## 2022-02-25 DIAGNOSIS — M6281 Muscle weakness (generalized): Secondary | ICD-10-CM | POA: Diagnosis not present

## 2022-02-26 DIAGNOSIS — R279 Unspecified lack of coordination: Secondary | ICD-10-CM | POA: Diagnosis not present

## 2022-02-26 DIAGNOSIS — R2689 Other abnormalities of gait and mobility: Secondary | ICD-10-CM | POA: Diagnosis not present

## 2022-02-26 DIAGNOSIS — G4700 Insomnia, unspecified: Secondary | ICD-10-CM | POA: Diagnosis not present

## 2022-02-26 DIAGNOSIS — I10 Essential (primary) hypertension: Secondary | ICD-10-CM | POA: Diagnosis not present

## 2022-02-26 DIAGNOSIS — M159 Polyosteoarthritis, unspecified: Secondary | ICD-10-CM | POA: Diagnosis not present

## 2022-02-26 DIAGNOSIS — M6281 Muscle weakness (generalized): Secondary | ICD-10-CM | POA: Diagnosis not present

## 2022-02-27 DIAGNOSIS — M6281 Muscle weakness (generalized): Secondary | ICD-10-CM | POA: Diagnosis not present

## 2022-02-27 DIAGNOSIS — R279 Unspecified lack of coordination: Secondary | ICD-10-CM | POA: Diagnosis not present

## 2022-02-27 DIAGNOSIS — R2689 Other abnormalities of gait and mobility: Secondary | ICD-10-CM | POA: Diagnosis not present

## 2022-02-27 DIAGNOSIS — M19011 Primary osteoarthritis, right shoulder: Secondary | ICD-10-CM | POA: Diagnosis not present

## 2022-02-28 DIAGNOSIS — M6281 Muscle weakness (generalized): Secondary | ICD-10-CM | POA: Diagnosis not present

## 2022-02-28 DIAGNOSIS — R2689 Other abnormalities of gait and mobility: Secondary | ICD-10-CM | POA: Diagnosis not present

## 2022-02-28 DIAGNOSIS — R279 Unspecified lack of coordination: Secondary | ICD-10-CM | POA: Diagnosis not present

## 2022-03-01 DIAGNOSIS — R279 Unspecified lack of coordination: Secondary | ICD-10-CM | POA: Diagnosis not present

## 2022-03-01 DIAGNOSIS — M6281 Muscle weakness (generalized): Secondary | ICD-10-CM | POA: Diagnosis not present

## 2022-03-01 DIAGNOSIS — R2689 Other abnormalities of gait and mobility: Secondary | ICD-10-CM | POA: Diagnosis not present

## 2022-03-04 DIAGNOSIS — R2689 Other abnormalities of gait and mobility: Secondary | ICD-10-CM | POA: Diagnosis not present

## 2022-03-04 DIAGNOSIS — M159 Polyosteoarthritis, unspecified: Secondary | ICD-10-CM | POA: Diagnosis not present

## 2022-03-04 DIAGNOSIS — M6281 Muscle weakness (generalized): Secondary | ICD-10-CM | POA: Diagnosis not present

## 2022-03-04 DIAGNOSIS — I129 Hypertensive chronic kidney disease with stage 1 through stage 4 chronic kidney disease, or unspecified chronic kidney disease: Secondary | ICD-10-CM | POA: Diagnosis not present

## 2022-03-04 DIAGNOSIS — R279 Unspecified lack of coordination: Secondary | ICD-10-CM | POA: Diagnosis not present

## 2022-03-05 DIAGNOSIS — K219 Gastro-esophageal reflux disease without esophagitis: Secondary | ICD-10-CM | POA: Diagnosis not present

## 2022-03-05 DIAGNOSIS — R2689 Other abnormalities of gait and mobility: Secondary | ICD-10-CM | POA: Diagnosis not present

## 2022-03-05 DIAGNOSIS — R279 Unspecified lack of coordination: Secondary | ICD-10-CM | POA: Diagnosis not present

## 2022-03-05 DIAGNOSIS — M1991 Primary osteoarthritis, unspecified site: Secondary | ICD-10-CM | POA: Diagnosis not present

## 2022-03-05 DIAGNOSIS — M6281 Muscle weakness (generalized): Secondary | ICD-10-CM | POA: Diagnosis not present

## 2022-03-05 DIAGNOSIS — N401 Enlarged prostate with lower urinary tract symptoms: Secondary | ICD-10-CM | POA: Diagnosis not present

## 2022-03-05 DIAGNOSIS — K5900 Constipation, unspecified: Secondary | ICD-10-CM | POA: Diagnosis not present

## 2022-03-06 DIAGNOSIS — R2689 Other abnormalities of gait and mobility: Secondary | ICD-10-CM | POA: Diagnosis not present

## 2022-03-06 DIAGNOSIS — M6281 Muscle weakness (generalized): Secondary | ICD-10-CM | POA: Diagnosis not present

## 2022-03-06 DIAGNOSIS — R279 Unspecified lack of coordination: Secondary | ICD-10-CM | POA: Diagnosis not present

## 2022-03-07 DIAGNOSIS — R279 Unspecified lack of coordination: Secondary | ICD-10-CM | POA: Diagnosis not present

## 2022-03-07 DIAGNOSIS — R2689 Other abnormalities of gait and mobility: Secondary | ICD-10-CM | POA: Diagnosis not present

## 2022-03-07 DIAGNOSIS — M6281 Muscle weakness (generalized): Secondary | ICD-10-CM | POA: Diagnosis not present

## 2022-03-08 DIAGNOSIS — R2689 Other abnormalities of gait and mobility: Secondary | ICD-10-CM | POA: Diagnosis not present

## 2022-03-08 DIAGNOSIS — R279 Unspecified lack of coordination: Secondary | ICD-10-CM | POA: Diagnosis not present

## 2022-03-08 DIAGNOSIS — M6281 Muscle weakness (generalized): Secondary | ICD-10-CM | POA: Diagnosis not present

## 2022-03-11 DIAGNOSIS — R2689 Other abnormalities of gait and mobility: Secondary | ICD-10-CM | POA: Diagnosis not present

## 2022-03-11 DIAGNOSIS — R279 Unspecified lack of coordination: Secondary | ICD-10-CM | POA: Diagnosis not present

## 2022-03-11 DIAGNOSIS — M6281 Muscle weakness (generalized): Secondary | ICD-10-CM | POA: Diagnosis not present

## 2022-03-12 DIAGNOSIS — M159 Polyosteoarthritis, unspecified: Secondary | ICD-10-CM | POA: Diagnosis not present

## 2022-03-12 DIAGNOSIS — R2689 Other abnormalities of gait and mobility: Secondary | ICD-10-CM | POA: Diagnosis not present

## 2022-03-12 DIAGNOSIS — N1831 Chronic kidney disease, stage 3a: Secondary | ICD-10-CM | POA: Diagnosis not present

## 2022-03-12 DIAGNOSIS — G4700 Insomnia, unspecified: Secondary | ICD-10-CM | POA: Diagnosis not present

## 2022-03-12 DIAGNOSIS — R279 Unspecified lack of coordination: Secondary | ICD-10-CM | POA: Diagnosis not present

## 2022-03-12 DIAGNOSIS — I129 Hypertensive chronic kidney disease with stage 1 through stage 4 chronic kidney disease, or unspecified chronic kidney disease: Secondary | ICD-10-CM | POA: Diagnosis not present

## 2022-03-12 DIAGNOSIS — M6281 Muscle weakness (generalized): Secondary | ICD-10-CM | POA: Diagnosis not present

## 2022-03-13 DIAGNOSIS — R279 Unspecified lack of coordination: Secondary | ICD-10-CM | POA: Diagnosis not present

## 2022-03-13 DIAGNOSIS — R2689 Other abnormalities of gait and mobility: Secondary | ICD-10-CM | POA: Diagnosis not present

## 2022-03-13 DIAGNOSIS — M6281 Muscle weakness (generalized): Secondary | ICD-10-CM | POA: Diagnosis not present

## 2022-03-14 DIAGNOSIS — R2689 Other abnormalities of gait and mobility: Secondary | ICD-10-CM | POA: Diagnosis not present

## 2022-03-14 DIAGNOSIS — K219 Gastro-esophageal reflux disease without esophagitis: Secondary | ICD-10-CM | POA: Diagnosis not present

## 2022-03-14 DIAGNOSIS — N401 Enlarged prostate with lower urinary tract symptoms: Secondary | ICD-10-CM | POA: Diagnosis not present

## 2022-03-14 DIAGNOSIS — I129 Hypertensive chronic kidney disease with stage 1 through stage 4 chronic kidney disease, or unspecified chronic kidney disease: Secondary | ICD-10-CM | POA: Diagnosis not present

## 2022-03-14 DIAGNOSIS — R279 Unspecified lack of coordination: Secondary | ICD-10-CM | POA: Diagnosis not present

## 2022-03-14 DIAGNOSIS — N1831 Chronic kidney disease, stage 3a: Secondary | ICD-10-CM | POA: Diagnosis not present

## 2022-03-14 DIAGNOSIS — M6281 Muscle weakness (generalized): Secondary | ICD-10-CM | POA: Diagnosis not present

## 2022-03-14 DIAGNOSIS — R197 Diarrhea, unspecified: Secondary | ICD-10-CM | POA: Diagnosis not present

## 2022-03-15 DIAGNOSIS — R2689 Other abnormalities of gait and mobility: Secondary | ICD-10-CM | POA: Diagnosis not present

## 2022-03-15 DIAGNOSIS — R279 Unspecified lack of coordination: Secondary | ICD-10-CM | POA: Diagnosis not present

## 2022-03-15 DIAGNOSIS — M6281 Muscle weakness (generalized): Secondary | ICD-10-CM | POA: Diagnosis not present

## 2022-03-16 DIAGNOSIS — M6281 Muscle weakness (generalized): Secondary | ICD-10-CM | POA: Diagnosis not present

## 2022-03-16 DIAGNOSIS — R279 Unspecified lack of coordination: Secondary | ICD-10-CM | POA: Diagnosis not present

## 2022-03-16 DIAGNOSIS — R2689 Other abnormalities of gait and mobility: Secondary | ICD-10-CM | POA: Diagnosis not present

## 2022-03-18 DIAGNOSIS — R2689 Other abnormalities of gait and mobility: Secondary | ICD-10-CM | POA: Diagnosis not present

## 2022-03-18 DIAGNOSIS — R279 Unspecified lack of coordination: Secondary | ICD-10-CM | POA: Diagnosis not present

## 2022-03-18 DIAGNOSIS — M6281 Muscle weakness (generalized): Secondary | ICD-10-CM | POA: Diagnosis not present

## 2022-03-19 DIAGNOSIS — R2689 Other abnormalities of gait and mobility: Secondary | ICD-10-CM | POA: Diagnosis not present

## 2022-03-19 DIAGNOSIS — M6281 Muscle weakness (generalized): Secondary | ICD-10-CM | POA: Diagnosis not present

## 2022-03-19 DIAGNOSIS — R279 Unspecified lack of coordination: Secondary | ICD-10-CM | POA: Diagnosis not present

## 2022-03-22 DIAGNOSIS — M6281 Muscle weakness (generalized): Secondary | ICD-10-CM | POA: Diagnosis not present

## 2022-03-22 DIAGNOSIS — R279 Unspecified lack of coordination: Secondary | ICD-10-CM | POA: Diagnosis not present

## 2022-03-22 DIAGNOSIS — R2689 Other abnormalities of gait and mobility: Secondary | ICD-10-CM | POA: Diagnosis not present

## 2022-03-25 DIAGNOSIS — R279 Unspecified lack of coordination: Secondary | ICD-10-CM | POA: Diagnosis not present

## 2022-03-25 DIAGNOSIS — M6281 Muscle weakness (generalized): Secondary | ICD-10-CM | POA: Diagnosis not present

## 2022-03-25 DIAGNOSIS — R2689 Other abnormalities of gait and mobility: Secondary | ICD-10-CM | POA: Diagnosis not present

## 2022-03-26 DIAGNOSIS — M6281 Muscle weakness (generalized): Secondary | ICD-10-CM | POA: Diagnosis not present

## 2022-03-26 DIAGNOSIS — R2689 Other abnormalities of gait and mobility: Secondary | ICD-10-CM | POA: Diagnosis not present

## 2022-03-26 DIAGNOSIS — R279 Unspecified lack of coordination: Secondary | ICD-10-CM | POA: Diagnosis not present

## 2022-03-27 DIAGNOSIS — R2689 Other abnormalities of gait and mobility: Secondary | ICD-10-CM | POA: Diagnosis not present

## 2022-03-27 DIAGNOSIS — R279 Unspecified lack of coordination: Secondary | ICD-10-CM | POA: Diagnosis not present

## 2022-03-27 DIAGNOSIS — M6281 Muscle weakness (generalized): Secondary | ICD-10-CM | POA: Diagnosis not present

## 2022-03-28 DIAGNOSIS — R279 Unspecified lack of coordination: Secondary | ICD-10-CM | POA: Diagnosis not present

## 2022-03-28 DIAGNOSIS — R2689 Other abnormalities of gait and mobility: Secondary | ICD-10-CM | POA: Diagnosis not present

## 2022-03-28 DIAGNOSIS — M6281 Muscle weakness (generalized): Secondary | ICD-10-CM | POA: Diagnosis not present

## 2022-04-01 DIAGNOSIS — R2689 Other abnormalities of gait and mobility: Secondary | ICD-10-CM | POA: Diagnosis not present

## 2022-04-01 DIAGNOSIS — R279 Unspecified lack of coordination: Secondary | ICD-10-CM | POA: Diagnosis not present

## 2022-04-01 DIAGNOSIS — M6281 Muscle weakness (generalized): Secondary | ICD-10-CM | POA: Diagnosis not present

## 2022-04-02 DIAGNOSIS — M6281 Muscle weakness (generalized): Secondary | ICD-10-CM | POA: Diagnosis not present

## 2022-04-02 DIAGNOSIS — R279 Unspecified lack of coordination: Secondary | ICD-10-CM | POA: Diagnosis not present

## 2022-04-02 DIAGNOSIS — R2689 Other abnormalities of gait and mobility: Secondary | ICD-10-CM | POA: Diagnosis not present

## 2022-04-03 DIAGNOSIS — M159 Polyosteoarthritis, unspecified: Secondary | ICD-10-CM | POA: Diagnosis not present

## 2022-04-03 DIAGNOSIS — I129 Hypertensive chronic kidney disease with stage 1 through stage 4 chronic kidney disease, or unspecified chronic kidney disease: Secondary | ICD-10-CM | POA: Diagnosis not present

## 2022-04-04 DIAGNOSIS — M6281 Muscle weakness (generalized): Secondary | ICD-10-CM | POA: Diagnosis not present

## 2022-04-04 DIAGNOSIS — R2689 Other abnormalities of gait and mobility: Secondary | ICD-10-CM | POA: Diagnosis not present

## 2022-04-04 DIAGNOSIS — R279 Unspecified lack of coordination: Secondary | ICD-10-CM | POA: Diagnosis not present

## 2022-04-05 DIAGNOSIS — R279 Unspecified lack of coordination: Secondary | ICD-10-CM | POA: Diagnosis not present

## 2022-04-05 DIAGNOSIS — R2689 Other abnormalities of gait and mobility: Secondary | ICD-10-CM | POA: Diagnosis not present

## 2022-04-05 DIAGNOSIS — M6281 Muscle weakness (generalized): Secondary | ICD-10-CM | POA: Diagnosis not present

## 2022-04-09 DIAGNOSIS — N1831 Chronic kidney disease, stage 3a: Secondary | ICD-10-CM | POA: Diagnosis not present

## 2022-04-09 DIAGNOSIS — M159 Polyosteoarthritis, unspecified: Secondary | ICD-10-CM | POA: Diagnosis not present

## 2022-04-09 DIAGNOSIS — R0781 Pleurodynia: Secondary | ICD-10-CM | POA: Diagnosis not present

## 2022-04-09 DIAGNOSIS — Z950 Presence of cardiac pacemaker: Secondary | ICD-10-CM | POA: Diagnosis not present

## 2022-04-09 DIAGNOSIS — N401 Enlarged prostate with lower urinary tract symptoms: Secondary | ICD-10-CM | POA: Diagnosis not present

## 2022-04-09 DIAGNOSIS — I129 Hypertensive chronic kidney disease with stage 1 through stage 4 chronic kidney disease, or unspecified chronic kidney disease: Secondary | ICD-10-CM | POA: Diagnosis not present

## 2022-04-10 DIAGNOSIS — R2689 Other abnormalities of gait and mobility: Secondary | ICD-10-CM | POA: Diagnosis not present

## 2022-04-10 DIAGNOSIS — M6281 Muscle weakness (generalized): Secondary | ICD-10-CM | POA: Diagnosis not present

## 2022-04-10 DIAGNOSIS — R279 Unspecified lack of coordination: Secondary | ICD-10-CM | POA: Diagnosis not present

## 2022-04-11 DIAGNOSIS — M6281 Muscle weakness (generalized): Secondary | ICD-10-CM | POA: Diagnosis not present

## 2022-04-11 DIAGNOSIS — R279 Unspecified lack of coordination: Secondary | ICD-10-CM | POA: Diagnosis not present

## 2022-04-11 DIAGNOSIS — R2689 Other abnormalities of gait and mobility: Secondary | ICD-10-CM | POA: Diagnosis not present

## 2022-04-12 DIAGNOSIS — M6281 Muscle weakness (generalized): Secondary | ICD-10-CM | POA: Diagnosis not present

## 2022-04-12 DIAGNOSIS — R2689 Other abnormalities of gait and mobility: Secondary | ICD-10-CM | POA: Diagnosis not present

## 2022-04-12 DIAGNOSIS — R279 Unspecified lack of coordination: Secondary | ICD-10-CM | POA: Diagnosis not present

## 2022-04-15 DIAGNOSIS — R2689 Other abnormalities of gait and mobility: Secondary | ICD-10-CM | POA: Diagnosis not present

## 2022-04-15 DIAGNOSIS — I129 Hypertensive chronic kidney disease with stage 1 through stage 4 chronic kidney disease, or unspecified chronic kidney disease: Secondary | ICD-10-CM | POA: Diagnosis not present

## 2022-04-15 DIAGNOSIS — M159 Polyosteoarthritis, unspecified: Secondary | ICD-10-CM | POA: Diagnosis not present

## 2022-04-15 DIAGNOSIS — M6281 Muscle weakness (generalized): Secondary | ICD-10-CM | POA: Diagnosis not present

## 2022-04-15 DIAGNOSIS — R279 Unspecified lack of coordination: Secondary | ICD-10-CM | POA: Diagnosis not present

## 2022-04-16 DIAGNOSIS — R2689 Other abnormalities of gait and mobility: Secondary | ICD-10-CM | POA: Diagnosis not present

## 2022-04-16 DIAGNOSIS — M159 Polyosteoarthritis, unspecified: Secondary | ICD-10-CM | POA: Diagnosis not present

## 2022-04-16 DIAGNOSIS — M6281 Muscle weakness (generalized): Secondary | ICD-10-CM | POA: Diagnosis not present

## 2022-04-16 DIAGNOSIS — R279 Unspecified lack of coordination: Secondary | ICD-10-CM | POA: Diagnosis not present

## 2022-04-16 DIAGNOSIS — G4700 Insomnia, unspecified: Secondary | ICD-10-CM | POA: Diagnosis not present

## 2022-04-16 DIAGNOSIS — I129 Hypertensive chronic kidney disease with stage 1 through stage 4 chronic kidney disease, or unspecified chronic kidney disease: Secondary | ICD-10-CM | POA: Diagnosis not present

## 2022-04-16 DIAGNOSIS — N1831 Chronic kidney disease, stage 3a: Secondary | ICD-10-CM | POA: Diagnosis not present

## 2022-04-17 DIAGNOSIS — R279 Unspecified lack of coordination: Secondary | ICD-10-CM | POA: Diagnosis not present

## 2022-04-17 DIAGNOSIS — M6281 Muscle weakness (generalized): Secondary | ICD-10-CM | POA: Diagnosis not present

## 2022-04-17 DIAGNOSIS — R2689 Other abnormalities of gait and mobility: Secondary | ICD-10-CM | POA: Diagnosis not present

## 2022-04-18 DIAGNOSIS — R279 Unspecified lack of coordination: Secondary | ICD-10-CM | POA: Diagnosis not present

## 2022-04-18 DIAGNOSIS — R2689 Other abnormalities of gait and mobility: Secondary | ICD-10-CM | POA: Diagnosis not present

## 2022-04-18 DIAGNOSIS — M6281 Muscle weakness (generalized): Secondary | ICD-10-CM | POA: Diagnosis not present

## 2022-04-19 ENCOUNTER — Emergency Department (HOSPITAL_COMMUNITY): Payer: Medicare Other

## 2022-04-19 ENCOUNTER — Other Ambulatory Visit: Payer: Self-pay

## 2022-04-19 ENCOUNTER — Emergency Department (HOSPITAL_COMMUNITY)
Admission: EM | Admit: 2022-04-19 | Discharge: 2022-04-19 | Disposition: A | Discharge status: Skilled Nursing Facility | Payer: Medicare Other | Attending: Emergency Medicine | Admitting: Emergency Medicine

## 2022-04-19 ENCOUNTER — Encounter (HOSPITAL_COMMUNITY): Payer: Self-pay

## 2022-04-19 DIAGNOSIS — M62838 Other muscle spasm: Secondary | ICD-10-CM | POA: Diagnosis not present

## 2022-04-19 DIAGNOSIS — R079 Chest pain, unspecified: Secondary | ICD-10-CM | POA: Diagnosis not present

## 2022-04-19 DIAGNOSIS — Z950 Presence of cardiac pacemaker: Secondary | ICD-10-CM | POA: Insufficient documentation

## 2022-04-19 DIAGNOSIS — W010XXA Fall on same level from slipping, tripping and stumbling without subsequent striking against object, initial encounter: Secondary | ICD-10-CM | POA: Insufficient documentation

## 2022-04-19 DIAGNOSIS — M25552 Pain in left hip: Secondary | ICD-10-CM | POA: Insufficient documentation

## 2022-04-19 DIAGNOSIS — M545 Low back pain, unspecified: Secondary | ICD-10-CM | POA: Insufficient documentation

## 2022-04-19 DIAGNOSIS — Z79899 Other long term (current) drug therapy: Secondary | ICD-10-CM | POA: Insufficient documentation

## 2022-04-19 DIAGNOSIS — Z7401 Bed confinement status: Secondary | ICD-10-CM | POA: Diagnosis not present

## 2022-04-19 DIAGNOSIS — I2510 Atherosclerotic heart disease of native coronary artery without angina pectoris: Secondary | ICD-10-CM | POA: Diagnosis not present

## 2022-04-19 DIAGNOSIS — R5383 Other fatigue: Secondary | ICD-10-CM | POA: Insufficient documentation

## 2022-04-19 DIAGNOSIS — R531 Weakness: Secondary | ICD-10-CM | POA: Diagnosis not present

## 2022-04-19 DIAGNOSIS — Z7982 Long term (current) use of aspirin: Secondary | ICD-10-CM | POA: Insufficient documentation

## 2022-04-19 DIAGNOSIS — W19XXXA Unspecified fall, initial encounter: Secondary | ICD-10-CM

## 2022-04-19 DIAGNOSIS — I10 Essential (primary) hypertension: Secondary | ICD-10-CM | POA: Diagnosis not present

## 2022-04-19 DIAGNOSIS — M47816 Spondylosis without myelopathy or radiculopathy, lumbar region: Secondary | ICD-10-CM | POA: Diagnosis not present

## 2022-04-19 DIAGNOSIS — R609 Edema, unspecified: Secondary | ICD-10-CM | POA: Diagnosis not present

## 2022-04-19 DIAGNOSIS — S0990XA Unspecified injury of head, initial encounter: Secondary | ICD-10-CM | POA: Diagnosis not present

## 2022-04-19 DIAGNOSIS — M542 Cervicalgia: Secondary | ICD-10-CM | POA: Diagnosis not present

## 2022-04-19 DIAGNOSIS — M62830 Muscle spasm of back: Secondary | ICD-10-CM | POA: Diagnosis not present

## 2022-04-19 DIAGNOSIS — R519 Headache, unspecified: Secondary | ICD-10-CM | POA: Diagnosis not present

## 2022-04-19 LAB — CBC WITH DIFFERENTIAL/PLATELET
Abs Immature Granulocytes: 0.02 10*3/uL (ref 0.00–0.07)
Basophils Absolute: 0 10*3/uL (ref 0.0–0.1)
Basophils Relative: 0 %
Eosinophils Absolute: 0.1 10*3/uL (ref 0.0–0.5)
Eosinophils Relative: 2 %
HCT: 38.2 % — ABNORMAL LOW (ref 39.0–52.0)
Hemoglobin: 12.8 g/dL — ABNORMAL LOW (ref 13.0–17.0)
Immature Granulocytes: 0 %
Lymphocytes Relative: 24 %
Lymphs Abs: 1.6 10*3/uL (ref 0.7–4.0)
MCH: 32.2 pg (ref 26.0–34.0)
MCHC: 33.5 g/dL (ref 30.0–36.0)
MCV: 96.2 fL (ref 80.0–100.0)
Monocytes Absolute: 0.5 10*3/uL (ref 0.1–1.0)
Monocytes Relative: 7 %
Neutro Abs: 4.6 10*3/uL (ref 1.7–7.7)
Neutrophils Relative %: 67 %
Platelets: 139 10*3/uL — ABNORMAL LOW (ref 150–400)
RBC: 3.97 MIL/uL — ABNORMAL LOW (ref 4.22–5.81)
RDW: 13.2 % (ref 11.5–15.5)
WBC: 6.9 10*3/uL (ref 4.0–10.5)
nRBC: 0 % (ref 0.0–0.2)

## 2022-04-19 LAB — URINALYSIS, ROUTINE W REFLEX MICROSCOPIC
Bilirubin Urine: NEGATIVE
Glucose, UA: NEGATIVE mg/dL
Hgb urine dipstick: NEGATIVE
Ketones, ur: NEGATIVE mg/dL
Leukocytes,Ua: NEGATIVE
Nitrite: NEGATIVE
Protein, ur: NEGATIVE mg/dL
Specific Gravity, Urine: 1.018 (ref 1.005–1.030)
pH: 7 (ref 5.0–8.0)

## 2022-04-19 LAB — COMPREHENSIVE METABOLIC PANEL
ALT: 14 U/L (ref 0–44)
AST: 17 U/L (ref 15–41)
Albumin: 3.8 g/dL (ref 3.5–5.0)
Alkaline Phosphatase: 68 U/L (ref 38–126)
Anion gap: 8 (ref 5–15)
BUN: 21 mg/dL (ref 8–23)
CO2: 27 mmol/L (ref 22–32)
Calcium: 8.8 mg/dL — ABNORMAL LOW (ref 8.9–10.3)
Chloride: 103 mmol/L (ref 98–111)
Creatinine, Ser: 1.13 mg/dL (ref 0.61–1.24)
GFR, Estimated: >60 mL/min (ref >60)
Glucose, Bld: 93 mg/dL (ref 70–99)
Potassium: 4.6 mmol/L (ref 3.5–5.1)
Sodium: 138 mmol/L (ref 135–145)
Total Bilirubin: 1.1 mg/dL (ref 0.3–1.2)
Total Protein: 6.5 g/dL (ref 6.5–8.1)

## 2022-04-19 LAB — TSH: TSH: 1.012 u[IU]/mL (ref 0.350–4.500)

## 2022-04-19 MED ORDER — LIDOCAINE 5 % EX PTCH: 1.0000 | MEDICATED_PATCH | CUTANEOUS | 0 refills | Status: DC

## 2022-04-19 NOTE — ED Triage Notes (Signed)
Patient coming from [place] [facility] assisted [facility] with c/o fall. Pt states that he tripped while using his walker. Patient reports increased bilateral leg weakness over the last couple days. Pt c/o left hip, left temple, and low back pain. Pt denies LOC, denies any blood thinners.

## 2022-04-19 NOTE — ED Notes (Signed)
Attempted to call report to [place], no response, VM left.

## 2022-04-19 NOTE — Discharge Instructions (Signed)
Your history, exam, and workup today did not show evidence of acute intracranial bleed or acute traumatic injuries.  Your exam was consistent with some tenderness and spasms in the low back and I suspect he pulled it when you fell.  Your workup was otherwise reassuring and we feel you are safe for discharge home but please use the Lidoderm patches to help with pain and spasm symptoms.  Please follow-up with your primary doctor.  Please rest.  If any symptoms change or worsen acutely, please return to the nearest emergency department.

## 2022-04-19 NOTE — ED Provider Notes (Signed)
Kevin Wiley EMERGENCY DEPARTMENT AT [facility] Provider Note   CSN: [id number] Arrival date & time: 04/19/22  1132     History  Chief Complaint  Patient presents with   Kevin Wiley is a 82 y.o. male.  The history is provided by the patient and medical records. No language interpreter was used.  Fall This is a new problem. The current episode started 3 to 5 hours ago. The problem occurs rarely. The problem has not changed since onset.Associated symptoms include headaches. Pertinent negatives include no chest pain, no abdominal pain and no shortness of breath. Nothing aggravates the symptoms. Nothing relieves the symptoms. He has tried nothing for the symptoms. The treatment provided no relief.       Home Medications Prior to Admission medications   Medication Sig Start Date End Date Taking? Authorizing Provider  amoxicillin-clavulanate (AUGMENTIN) 875-125 MG tablet Take 1 tablet by mouth every 12 (twelve) hours. 09/15/21   Cristie Hem, MD  aspirin EC 81 MG tablet Take 1 tablet (81 mg total) by mouth daily. Swallow whole. Patient not taking: Reported on 09/03/2021 04/17/20   Croitoru, Mihai, MD  atenolol (TENORMIN) 100 MG tablet Take 1 tablet (100 mg total) by mouth daily. 09/06/21   [name], MD  atorvastatin (LIPITOR) 80 MG tablet TAKE 1 TABLET(80 MG) BY MOUTH AT BEDTIME 04/24/21   Croitoru, Mihai, MD  gabapentin (NEURONTIN) 800 MG tablet Take 1 tablet (800 mg total) by mouth 3 (three) times daily. 03/16/18   Hennie Duos, MD  hydrALAZINE (APRESOLINE) 50 MG tablet Take 1 tablet (50 mg total) by mouth 3 (three) times daily. 03/16/18   Hennie Duos, MD  isosorbide mononitrate (IMDUR) 30 MG 24 hr tablet Take 1 tablet (30 mg total) by mouth daily. 09/15/20   Blanchie Dessert, MD  methocarbamol (ROBAXIN) 500 MG tablet Take 1 tablet (500 mg total) by mouth every 8 (eight) hours as needed for muscle spasms. 08/01/20   Darliss Cheney, MD  nitroGLYCERIN  (NITROSTAT) 0.4 MG SL tablet Place 1 tablet (0.4 mg total) under the tongue every 5 (five) minutes as needed for chest pain (times 3 times then call MD). 03/16/18   Hennie Duos, MD  omeprazole (PRILOSEC) 20 MG capsule Take 1 capsule (20 mg total) by mouth daily. 09/15/20   Blanchie Dessert, MD  oxybutynin (DITROPAN-XL) 5 MG 24 hr tablet Take 5 mg by mouth daily. 07/04/21   [provider]  polyethylene glycol (MIRALAX / GLYCOLAX) 17 g packet Take 17 g by mouth daily as needed for moderate constipation. 09/06/21   [name], MD  QUEtiapine (SEROQUEL) 25 MG tablet Take 25 mg by mouth at bedtime.    [provider]  tamsulosin (FLOMAX) 0.4 MG CAPS capsule Take 1 capsule (0.4 mg total) by mouth at bedtime. 03/16/18   Hennie Duos, MD  traZODone (DESYREL) 100 MG tablet Take 1 tablet (100 mg total) by mouth at bedtime. Patient taking differently: Take 200 mg by mouth at bedtime. 03/16/18   Hennie Duos, MD  vitamin B-12 (CYANOCOBALAMIN) 100 MCG tablet Take 100 mcg by mouth daily.    [provider]  zolpidem (AMBIEN) 10 MG tablet Take 10 mg by mouth at bedtime.    [provider]      Allergies    Amlodipine besy-benazepril hcl and Hytrin [terazosin]    Review of Systems   Review of Systems  Constitutional:  Positive for fatigue. Negative  for chills and fever.  HENT:  Negative for congestion.   Eyes:  Negative for visual disturbance.  Respiratory:  Negative for cough, chest tightness, shortness of breath and wheezing.   Cardiovascular:  Negative for chest pain and palpitations.  Gastrointestinal:  Negative for abdominal pain, diarrhea, nausea and vomiting.  Genitourinary:  Negative for dysuria and flank pain.  Musculoskeletal:  Positive for back pain. Negative for neck pain and neck stiffness.  Skin:  Negative for rash and wound.  Neurological:  Positive for headaches. Negative for dizziness, weakness, light-headedness and numbness.   Psychiatric/Behavioral:  Negative for agitation and confusion.   All other systems reviewed and are negative.   Physical Exam Updated Vital Signs BP (!) 154/84   Pulse 61   Temp 97.9 F (36.6 C) (Oral)   Resp 16   SpO2 97%  Physical Exam Vitals and nursing note reviewed.  Constitutional:      General: He is not in acute distress.    Appearance: He is well-developed. He is not ill-appearing, toxic-appearing or diaphoretic.  HENT:     Head: Normocephalic and atraumatic.     Nose: Nose normal. No congestion or rhinorrhea.     Mouth/Throat:     Mouth: Mucous membranes are moist.     Pharynx: No oropharyngeal exudate or posterior oropharyngeal erythema.  Eyes:     Extraocular Movements: Extraocular movements intact.     Conjunctiva/sclera: Conjunctivae normal.  Cardiovascular:     Rate and Rhythm: Normal rate and regular rhythm.     Pulses: Normal pulses.     Heart sounds: No murmur heard. Pulmonary:     Effort: Pulmonary effort is normal. No respiratory distress.     Breath sounds: Normal breath sounds. No wheezing, rhonchi or rales.  Chest:     Chest wall: No tenderness.  Abdominal:     General: Abdomen is flat.     Palpations: Abdomen is soft.     Tenderness: There is no abdominal tenderness. There is no right CVA tenderness, left CVA tenderness, guarding or rebound.  Musculoskeletal:        General: Tenderness present. No swelling.     Cervical back: Neck supple. No tenderness.     Lumbar back: Spasms and tenderness present. No bony tenderness.     Right lower leg: No edema.     Left lower leg: No edema.  Skin:    General: Skin is warm and dry.     Capillary Refill: Capillary refill takes less than 2 seconds.     Findings: No erythema or rash.  Neurological:     General: No focal deficit present.     Mental Status: He is alert.     Sensory: No sensory deficit.     Motor: No weakness.  Psychiatric:        Mood and Affect: Mood normal.     ED Results /  Procedures / Treatments   Labs (all labs ordered are listed, but only abnormal results are displayed) Labs Reviewed  CBC WITH DIFFERENTIAL/PLATELET - Abnormal; Notable for the following components:      Result Value   RBC 3.97 (*)    Hemoglobin 12.8 (*)    HCT 38.2 (*)    Platelets 139 (*)    All other components within normal limits  COMPREHENSIVE METABOLIC PANEL - Abnormal; Notable for the following components:   Calcium 8.8 (*)    All other components within normal limits  TSH  URINALYSIS, ROUTINE W REFLEX MICROSCOPIC  EKG EKG Interpretation  Date/Time:  Friday April 19 2022 12:41:27 EDT Ventricular Rate:  66 PR Interval:  168 QRS Duration: 118 QT Interval:  483 QTC Calculation: 507 R Axis:   -1 Text Interpretation: paced Incomplete right bundle branch block Abnormal T, consider ischemia, diffuse leads when compared to prior, similar paced rhythm. No STEMI Confirmed by Antony Blackbird [phone]) on 04/19/2022 12:51:03 PM  Radiology CT HEAD WO CONTRAST (5MM)  Result Date: 04/19/2022 CLINICAL DATA:  Fall from standing with head and neck pain. EXAM: CT HEAD WITHOUT CONTRAST CT CERVICAL SPINE WITHOUT CONTRAST TECHNIQUE: Multidetector CT imaging of the head and cervical spine was performed following the standard protocol without intravenous contrast. Multiplanar CT image reconstructions of the cervical spine were also generated. RADIATION DOSE REDUCTION: This exam was performed according to the departmental dose-optimization program which includes automated exposure control, adjustment of the mA and/or kV according to patient size and/or use of iterative reconstruction technique. COMPARISON:  None Available. FINDINGS: CT HEAD FINDINGS Brain: No evidence of acute infarction, hemorrhage, hydrocephalus, extra-axial collection or mass lesion/mass effect. There is mild cerebral volume loss with associated ex vacuo dilatation. Periventricular white matter hypoattenuation likely represents  chronic small vessel ischemic disease. Vascular: There are vascular calcifications in the carotid siphons. Skull: Normal. Negative for fracture or focal lesion. Sinuses/Orbits: No acute finding. Other: None. CT CERVICAL SPINE FINDINGS Alignment: The patient is status post anterior spinal fusion from C4-C7. There is no evidence of hardware failure. There is 2 mm anterolisthesis of C3 on C4, unchanged from prior exam. No traumatic listhesis. Skull base and vertebrae: No acute fracture. No primary bone lesion or focal pathologic process. Soft tissues and spinal canal: No prevertebral fluid or swelling. No visible canal hematoma. Disc levels: Degenerative changes are seen throughout the cervical spine. Upper chest: Negative. Other: None. IMPRESSION: 1. No acute intracranial process. 2. No acute osseous injury in the cervical spine. Electronically Signed   By: Zerita Boers M.D.   On: 04/19/2022 14:47   CT Cervical Spine Wo Contrast  Result Date: 04/19/2022 CLINICAL DATA:  Fall from standing with head and neck pain. EXAM: CT HEAD WITHOUT CONTRAST CT CERVICAL SPINE WITHOUT CONTRAST TECHNIQUE: Multidetector CT imaging of the head and cervical spine was performed following the standard protocol without intravenous contrast. Multiplanar CT image reconstructions of the cervical spine were also generated. RADIATION DOSE REDUCTION: This exam was performed according to the departmental dose-optimization program which includes automated exposure control, adjustment of the mA and/or kV according to patient size and/or use of iterative reconstruction technique. COMPARISON:  None Available. FINDINGS: CT HEAD FINDINGS Brain: No evidence of acute infarction, hemorrhage, hydrocephalus, extra-axial collection or mass lesion/mass effect. There is mild cerebral volume loss with associated ex vacuo dilatation. Periventricular white matter hypoattenuation likely represents chronic small vessel ischemic disease. Vascular: There are  vascular calcifications in the carotid siphons. Skull: Normal. Negative for fracture or focal lesion. Sinuses/Orbits: No acute finding. Other: None. CT CERVICAL SPINE FINDINGS Alignment: The patient is status post anterior spinal fusion from C4-C7. There is no evidence of hardware failure. There is 2 mm anterolisthesis of C3 on C4, unchanged from prior exam. No traumatic listhesis. Skull base and vertebrae: No acute fracture. No primary bone lesion or focal pathologic process. Soft tissues and spinal canal: No prevertebral fluid or swelling. No visible canal hematoma. Disc levels: Degenerative changes are seen throughout the cervical spine. Upper chest: Negative. Other: None. IMPRESSION: 1. No acute intracranial process. 2. No acute osseous injury in  the cervical spine. Electronically Signed   By: Zerita Boers M.D.   On: 04/19/2022 14:47   DG Chest Portable 1 View  Result Date: 04/19/2022 CLINICAL DATA:  Left hip pain after a fall. EXAM: PORTABLE CHEST 1 VIEW COMPARISON:  Chest radiograph dated 09/15/2021. FINDINGS: The heart size and mediastinal contours are within normal limits. Vascular calcifications are seen in the aortic arch. Both lungs are clear. Degenerative changes are seen in the spine. Degenerative changes are seen in the right shoulder. A right subclavian approach cardiac device is redemonstrated. Fixation hardware overlies the cervical spine. IMPRESSION: No active disease. Electronically Signed   By: Zerita Boers M.D.   On: 04/19/2022 14:31   DG Hip Unilat W or Wo Pelvis 2-3 Views Left  Result Date: 04/19/2022 CLINICAL DATA:  Left hip pain after a fall. EXAM: DG HIP (WITH OR WITHOUT PELVIS) 2-3V LEFT COMPARISON:  Left hip radiographs dated 01/23/2019. FINDINGS: There is no evidence of hip fracture or dislocation. Degenerative changes are seen in the spine. IMPRESSION: No acute osseous injury. Electronically Signed   By: Zerita Boers M.D.   On: 04/19/2022 14:26   DG Lumbar Spine  Complete  Result Date: 04/19/2022 CLINICAL DATA:  Provided history: Left hip pain after fall. Low back pain. EXAM: LUMBAR SPINE - COMPLETE 4+ VIEW COMPARISON:  Grafts of the lumbar spine 02/15/2017. FINDINGS: Five lumbar vertebrae. The caudal most well-formed intervertebral disc space is designated L5-S1. Dextrocurvature of the lumbar spine. Slight grade 1 retrolisthesis at T12-L1, L1-L2 and L2-L3. No lumbar vertebral compression fracture. Lumbar spondylosis with multilevel disc space narrowing and anterior and posterior osteophytes. Disc space narrowing is greatest at L2-L3 (moderate to advanced at this level). Facet arthrosis, greatest at L4-L5 and L5-S1. IMPRESSION: 1. No lumbar vertebral compression fracture. 2. Dextrocurvature of the lumbar spine. 3. Mild grade 1 retrolisthesis at T12-L1, L1-L2 and L2-L3. 4. Lumbar spondylosis, as described. Electronically Signed   By: Kellie Simmering D.O.   On: 04/19/2022 14:25    Procedures Procedures    Medications Ordered in ED Medications - No data to display  ED Course/ Medical Decision Making/ A&P                             Medical Decision Making Amount and/or Complexity of Data Reviewed Labs: ordered. Radiology: ordered.  Risk Prescription drug management.    Kevin Wiley is a 82 y.o. male with a past medical history significant for hypertension, hyperlipidemia, CAD, previous sick sinus syndrome with a pacemaker, GERD, Crohn's disease, and AAA who presents for about a week of generalized fatigue and weakness and a fall today.  Patient reports she was walking with his walker when he got tripped up and fell the ground.  He reports she hit and has pain in his left hip, low back, and his head.  He otherwise denies fevers, chills, congestion, cough, nausea, vomiting, constipation, diarrhea, or urinary changes.  He has felt tired.  Denies any sick contacts.  Denies any chest pain or shortness of breath.  Denies abdominal pain.  Denies any pain in his  knees ankles or feet.  Denies any numbness or tingling.  On exam, patient does have tenderness in the left hip.  Intact sensation and pulses distally.  He was able to raise both legs off the bed.  Seems symmetric.  Back was tender primarily paraspinally in the lumbar spine and not midline.  Lungs were clear.  Chest  nontender.  Rest of back nontender.  Symmetric smile.  Clear speech.  Pupils are symmetric and reactive with normal extract movements.  No tenderness on the neck.  Due to his fall and hitting his head will get CT head and neck.  Will get chest x-ray and urinalysis to look for occult infection.  Get screening labs with his fatigue.  We will get x-rays of his low back and left hip to look for fracture.  Anticipate reassessment after workup to determine disposition.  Imaging showed no acute fracture or intracranial bleed.  X-rays did not show acute fractures either.  Labs overall reassuring.  We went over all the findings together.  Patient is feeling better now.  Will discharge patient with Lidoderm patches as I am concerned about muscle relaxant may cause more falls.  Patient agrees with plan and will see his PCP.  Patient had no other questions or concerns and was discharged in good condition.        Final Clinical Impression(s) / ED Diagnoses Final diagnoses:  Fall, initial encounter  Muscle spasm    Rx / DC Orders ED Discharge Orders          Ordered    lidocaine (LIDODERM) 5 %  Every 24 hours        04/19/22 1615           Clinical Impression: 1. Fall, initial encounter   2. Muscle spasm     Disposition: Discharge  Condition: Good  I have discussed the results, Dx and Tx plan with the pt(& family if present). He/she/they expressed understanding and agree(s) with the plan. Discharge instructions discussed at great length. Strict return precautions discussed and pt &/or family have verbalized understanding of the instructions. No further questions at time of  discharge.    New Prescriptions   LIDOCAINE (LIDODERM) 5 %    Place 1 patch onto the skin daily. Remove & Discard patch within 12 hours or as directed by MD    Follow Up: Burnard Bunting, MD [place] [address] [place] [facility] at [facility] [address] [place]        Tarry Blayney, Gwenyth Allegra, MD 04/19/22 [phone]

## 2022-04-19 NOTE — ED Notes (Signed)
[facility] called for transport.  

## 2022-04-22 DIAGNOSIS — R279 Unspecified lack of coordination: Secondary | ICD-10-CM | POA: Diagnosis not present

## 2022-04-22 DIAGNOSIS — M6281 Muscle weakness (generalized): Secondary | ICD-10-CM | POA: Diagnosis not present

## 2022-04-22 DIAGNOSIS — R2689 Other abnormalities of gait and mobility: Secondary | ICD-10-CM | POA: Diagnosis not present

## 2022-04-23 ENCOUNTER — Telehealth: Payer: Self-pay

## 2022-04-23 DIAGNOSIS — R2689 Other abnormalities of gait and mobility: Secondary | ICD-10-CM | POA: Diagnosis not present

## 2022-04-23 DIAGNOSIS — M6281 Muscle weakness (generalized): Secondary | ICD-10-CM | POA: Diagnosis not present

## 2022-04-23 DIAGNOSIS — R279 Unspecified lack of coordination: Secondary | ICD-10-CM | POA: Diagnosis not present

## 2022-04-23 NOTE — Telephone Encounter (Signed)
     Patient  visit on 3/15  at [facility]  ALF  Have you been able to follow up with your primary care physician? YES   The patient was or was not able to obtain any needed medicine or equipment. YES   Are there diet recommendations that you are having difficulty following? NA   Patient expresses understanding of discharge instructions and education provided has no other needs at this time.  [place], [place] [phone] [address], [place], [place] [address] Phone: [phone] Email: [email or web address]

## 2022-04-25 DIAGNOSIS — M6281 Muscle weakness (generalized): Secondary | ICD-10-CM | POA: Diagnosis not present

## 2022-04-25 DIAGNOSIS — R2689 Other abnormalities of gait and mobility: Secondary | ICD-10-CM | POA: Diagnosis not present

## 2022-04-25 DIAGNOSIS — R279 Unspecified lack of coordination: Secondary | ICD-10-CM | POA: Diagnosis not present

## 2022-04-26 DIAGNOSIS — R2689 Other abnormalities of gait and mobility: Secondary | ICD-10-CM | POA: Diagnosis not present

## 2022-04-26 DIAGNOSIS — R279 Unspecified lack of coordination: Secondary | ICD-10-CM | POA: Diagnosis not present

## 2022-04-26 DIAGNOSIS — M6281 Muscle weakness (generalized): Secondary | ICD-10-CM | POA: Diagnosis not present

## 2022-04-29 DIAGNOSIS — R279 Unspecified lack of coordination: Secondary | ICD-10-CM | POA: Diagnosis not present

## 2022-04-29 DIAGNOSIS — M6281 Muscle weakness (generalized): Secondary | ICD-10-CM | POA: Diagnosis not present

## 2022-04-29 DIAGNOSIS — R2689 Other abnormalities of gait and mobility: Secondary | ICD-10-CM | POA: Diagnosis not present

## 2022-04-30 DIAGNOSIS — R2689 Other abnormalities of gait and mobility: Secondary | ICD-10-CM | POA: Diagnosis not present

## 2022-04-30 DIAGNOSIS — M6281 Muscle weakness (generalized): Secondary | ICD-10-CM | POA: Diagnosis not present

## 2022-04-30 DIAGNOSIS — R279 Unspecified lack of coordination: Secondary | ICD-10-CM | POA: Diagnosis not present

## 2022-05-01 DIAGNOSIS — R279 Unspecified lack of coordination: Secondary | ICD-10-CM | POA: Diagnosis not present

## 2022-05-01 DIAGNOSIS — M6281 Muscle weakness (generalized): Secondary | ICD-10-CM | POA: Diagnosis not present

## 2022-05-01 DIAGNOSIS — R2689 Other abnormalities of gait and mobility: Secondary | ICD-10-CM | POA: Diagnosis not present

## 2022-05-02 DIAGNOSIS — M6281 Muscle weakness (generalized): Secondary | ICD-10-CM | POA: Diagnosis not present

## 2022-05-02 DIAGNOSIS — R2689 Other abnormalities of gait and mobility: Secondary | ICD-10-CM | POA: Diagnosis not present

## 2022-05-02 DIAGNOSIS — R279 Unspecified lack of coordination: Secondary | ICD-10-CM | POA: Diagnosis not present

## 2022-05-06 DIAGNOSIS — M6281 Muscle weakness (generalized): Secondary | ICD-10-CM | POA: Diagnosis not present

## 2022-05-06 DIAGNOSIS — R279 Unspecified lack of coordination: Secondary | ICD-10-CM | POA: Diagnosis not present

## 2022-05-06 DIAGNOSIS — R2689 Other abnormalities of gait and mobility: Secondary | ICD-10-CM | POA: Diagnosis not present

## 2022-05-08 DIAGNOSIS — M6281 Muscle weakness (generalized): Secondary | ICD-10-CM | POA: Diagnosis not present

## 2022-05-08 DIAGNOSIS — R279 Unspecified lack of coordination: Secondary | ICD-10-CM | POA: Diagnosis not present

## 2022-05-08 DIAGNOSIS — R2689 Other abnormalities of gait and mobility: Secondary | ICD-10-CM | POA: Diagnosis not present

## 2022-05-09 DIAGNOSIS — R2689 Other abnormalities of gait and mobility: Secondary | ICD-10-CM | POA: Diagnosis not present

## 2022-05-09 DIAGNOSIS — M6281 Muscle weakness (generalized): Secondary | ICD-10-CM | POA: Diagnosis not present

## 2022-05-09 DIAGNOSIS — R279 Unspecified lack of coordination: Secondary | ICD-10-CM | POA: Diagnosis not present

## 2022-05-10 DIAGNOSIS — M6281 Muscle weakness (generalized): Secondary | ICD-10-CM | POA: Diagnosis not present

## 2022-05-10 DIAGNOSIS — R279 Unspecified lack of coordination: Secondary | ICD-10-CM | POA: Diagnosis not present

## 2022-05-10 DIAGNOSIS — R2689 Other abnormalities of gait and mobility: Secondary | ICD-10-CM | POA: Diagnosis not present

## 2022-05-13 DIAGNOSIS — R279 Unspecified lack of coordination: Secondary | ICD-10-CM | POA: Diagnosis not present

## 2022-05-13 DIAGNOSIS — M6281 Muscle weakness (generalized): Secondary | ICD-10-CM | POA: Diagnosis not present

## 2022-05-13 DIAGNOSIS — R2689 Other abnormalities of gait and mobility: Secondary | ICD-10-CM | POA: Diagnosis not present

## 2022-05-14 DIAGNOSIS — K219 Gastro-esophageal reflux disease without esophagitis: Secondary | ICD-10-CM | POA: Diagnosis not present

## 2022-05-14 DIAGNOSIS — R279 Unspecified lack of coordination: Secondary | ICD-10-CM | POA: Diagnosis not present

## 2022-05-14 DIAGNOSIS — I129 Hypertensive chronic kidney disease with stage 1 through stage 4 chronic kidney disease, or unspecified chronic kidney disease: Secondary | ICD-10-CM | POA: Diagnosis not present

## 2022-05-14 DIAGNOSIS — G4700 Insomnia, unspecified: Secondary | ICD-10-CM | POA: Diagnosis not present

## 2022-05-14 DIAGNOSIS — N1831 Chronic kidney disease, stage 3a: Secondary | ICD-10-CM | POA: Diagnosis not present

## 2022-05-14 DIAGNOSIS — M6281 Muscle weakness (generalized): Secondary | ICD-10-CM | POA: Diagnosis not present

## 2022-05-14 DIAGNOSIS — R2689 Other abnormalities of gait and mobility: Secondary | ICD-10-CM | POA: Diagnosis not present

## 2022-05-16 DIAGNOSIS — M6281 Muscle weakness (generalized): Secondary | ICD-10-CM | POA: Diagnosis not present

## 2022-05-16 DIAGNOSIS — R279 Unspecified lack of coordination: Secondary | ICD-10-CM | POA: Diagnosis not present

## 2022-05-16 DIAGNOSIS — R2689 Other abnormalities of gait and mobility: Secondary | ICD-10-CM | POA: Diagnosis not present

## 2022-05-21 DIAGNOSIS — R2689 Other abnormalities of gait and mobility: Secondary | ICD-10-CM | POA: Diagnosis not present

## 2022-05-21 DIAGNOSIS — R279 Unspecified lack of coordination: Secondary | ICD-10-CM | POA: Diagnosis not present

## 2022-05-21 DIAGNOSIS — M6281 Muscle weakness (generalized): Secondary | ICD-10-CM | POA: Diagnosis not present

## 2022-05-23 DIAGNOSIS — M6281 Muscle weakness (generalized): Secondary | ICD-10-CM | POA: Diagnosis not present

## 2022-05-23 DIAGNOSIS — R2689 Other abnormalities of gait and mobility: Secondary | ICD-10-CM | POA: Diagnosis not present

## 2022-05-23 DIAGNOSIS — R279 Unspecified lack of coordination: Secondary | ICD-10-CM | POA: Diagnosis not present

## 2022-05-24 DIAGNOSIS — M6281 Muscle weakness (generalized): Secondary | ICD-10-CM | POA: Diagnosis not present

## 2022-05-24 DIAGNOSIS — R2689 Other abnormalities of gait and mobility: Secondary | ICD-10-CM | POA: Diagnosis not present

## 2022-05-24 DIAGNOSIS — R279 Unspecified lack of coordination: Secondary | ICD-10-CM | POA: Diagnosis not present

## 2022-05-28 DIAGNOSIS — G4700 Insomnia, unspecified: Secondary | ICD-10-CM | POA: Diagnosis not present

## 2022-05-28 DIAGNOSIS — R279 Unspecified lack of coordination: Secondary | ICD-10-CM | POA: Diagnosis not present

## 2022-05-28 DIAGNOSIS — M6281 Muscle weakness (generalized): Secondary | ICD-10-CM | POA: Diagnosis not present

## 2022-05-28 DIAGNOSIS — M159 Polyosteoarthritis, unspecified: Secondary | ICD-10-CM | POA: Diagnosis not present

## 2022-05-28 DIAGNOSIS — R2689 Other abnormalities of gait and mobility: Secondary | ICD-10-CM | POA: Diagnosis not present

## 2022-05-28 DIAGNOSIS — N1831 Chronic kidney disease, stage 3a: Secondary | ICD-10-CM | POA: Diagnosis not present

## 2022-05-28 DIAGNOSIS — I129 Hypertensive chronic kidney disease with stage 1 through stage 4 chronic kidney disease, or unspecified chronic kidney disease: Secondary | ICD-10-CM | POA: Diagnosis not present

## 2022-05-30 DIAGNOSIS — M6281 Muscle weakness (generalized): Secondary | ICD-10-CM | POA: Diagnosis not present

## 2022-05-30 DIAGNOSIS — R279 Unspecified lack of coordination: Secondary | ICD-10-CM | POA: Diagnosis not present

## 2022-05-30 DIAGNOSIS — R2689 Other abnormalities of gait and mobility: Secondary | ICD-10-CM | POA: Diagnosis not present

## 2022-05-31 DIAGNOSIS — M6281 Muscle weakness (generalized): Secondary | ICD-10-CM | POA: Diagnosis not present

## 2022-05-31 DIAGNOSIS — R2689 Other abnormalities of gait and mobility: Secondary | ICD-10-CM | POA: Diagnosis not present

## 2022-05-31 DIAGNOSIS — R279 Unspecified lack of coordination: Secondary | ICD-10-CM | POA: Diagnosis not present

## 2022-06-03 DIAGNOSIS — M19011 Primary osteoarthritis, right shoulder: Secondary | ICD-10-CM | POA: Diagnosis not present

## 2022-06-03 DIAGNOSIS — M6281 Muscle weakness (generalized): Secondary | ICD-10-CM | POA: Diagnosis not present

## 2022-06-03 DIAGNOSIS — R279 Unspecified lack of coordination: Secondary | ICD-10-CM | POA: Diagnosis not present

## 2022-06-03 DIAGNOSIS — R2689 Other abnormalities of gait and mobility: Secondary | ICD-10-CM | POA: Diagnosis not present

## 2022-06-04 DIAGNOSIS — R279 Unspecified lack of coordination: Secondary | ICD-10-CM | POA: Diagnosis not present

## 2022-06-04 DIAGNOSIS — M6281 Muscle weakness (generalized): Secondary | ICD-10-CM | POA: Diagnosis not present

## 2022-06-04 DIAGNOSIS — R2689 Other abnormalities of gait and mobility: Secondary | ICD-10-CM | POA: Diagnosis not present

## 2022-06-06 DIAGNOSIS — M6281 Muscle weakness (generalized): Secondary | ICD-10-CM | POA: Diagnosis not present

## 2022-06-06 DIAGNOSIS — R279 Unspecified lack of coordination: Secondary | ICD-10-CM | POA: Diagnosis not present

## 2022-06-06 DIAGNOSIS — R2689 Other abnormalities of gait and mobility: Secondary | ICD-10-CM | POA: Diagnosis not present

## 2022-06-10 DIAGNOSIS — R2689 Other abnormalities of gait and mobility: Secondary | ICD-10-CM | POA: Diagnosis not present

## 2022-06-10 DIAGNOSIS — R279 Unspecified lack of coordination: Secondary | ICD-10-CM | POA: Diagnosis not present

## 2022-06-10 DIAGNOSIS — M6281 Muscle weakness (generalized): Secondary | ICD-10-CM | POA: Diagnosis not present

## 2022-06-11 DIAGNOSIS — N1831 Chronic kidney disease, stage 3a: Secondary | ICD-10-CM | POA: Diagnosis not present

## 2022-06-11 DIAGNOSIS — M159 Polyosteoarthritis, unspecified: Secondary | ICD-10-CM | POA: Diagnosis not present

## 2022-06-11 DIAGNOSIS — N401 Enlarged prostate with lower urinary tract symptoms: Secondary | ICD-10-CM | POA: Diagnosis not present

## 2022-06-11 DIAGNOSIS — I129 Hypertensive chronic kidney disease with stage 1 through stage 4 chronic kidney disease, or unspecified chronic kidney disease: Secondary | ICD-10-CM | POA: Diagnosis not present

## 2022-06-12 DIAGNOSIS — M6281 Muscle weakness (generalized): Secondary | ICD-10-CM | POA: Diagnosis not present

## 2022-06-12 DIAGNOSIS — R2689 Other abnormalities of gait and mobility: Secondary | ICD-10-CM | POA: Diagnosis not present

## 2022-06-12 DIAGNOSIS — R279 Unspecified lack of coordination: Secondary | ICD-10-CM | POA: Diagnosis not present

## 2022-06-13 DIAGNOSIS — R2689 Other abnormalities of gait and mobility: Secondary | ICD-10-CM | POA: Diagnosis not present

## 2022-06-13 DIAGNOSIS — M6281 Muscle weakness (generalized): Secondary | ICD-10-CM | POA: Diagnosis not present

## 2022-06-13 DIAGNOSIS — R279 Unspecified lack of coordination: Secondary | ICD-10-CM | POA: Diagnosis not present

## 2022-06-19 DIAGNOSIS — M6281 Muscle weakness (generalized): Secondary | ICD-10-CM | POA: Diagnosis not present

## 2022-06-19 DIAGNOSIS — R2689 Other abnormalities of gait and mobility: Secondary | ICD-10-CM | POA: Diagnosis not present

## 2022-06-19 DIAGNOSIS — R279 Unspecified lack of coordination: Secondary | ICD-10-CM | POA: Diagnosis not present

## 2022-06-21 DIAGNOSIS — M6281 Muscle weakness (generalized): Secondary | ICD-10-CM | POA: Diagnosis not present

## 2022-06-21 DIAGNOSIS — R279 Unspecified lack of coordination: Secondary | ICD-10-CM | POA: Diagnosis not present

## 2022-06-21 DIAGNOSIS — R2689 Other abnormalities of gait and mobility: Secondary | ICD-10-CM | POA: Diagnosis not present

## 2022-06-24 DIAGNOSIS — R279 Unspecified lack of coordination: Secondary | ICD-10-CM | POA: Diagnosis not present

## 2022-06-24 DIAGNOSIS — M6281 Muscle weakness (generalized): Secondary | ICD-10-CM | POA: Diagnosis not present

## 2022-06-24 DIAGNOSIS — R2689 Other abnormalities of gait and mobility: Secondary | ICD-10-CM | POA: Diagnosis not present

## 2022-06-25 DIAGNOSIS — M6281 Muscle weakness (generalized): Secondary | ICD-10-CM | POA: Diagnosis not present

## 2022-06-25 DIAGNOSIS — R2689 Other abnormalities of gait and mobility: Secondary | ICD-10-CM | POA: Diagnosis not present

## 2022-06-25 DIAGNOSIS — R279 Unspecified lack of coordination: Secondary | ICD-10-CM | POA: Diagnosis not present

## 2022-06-26 ENCOUNTER — Other Ambulatory Visit: Payer: Self-pay | Admitting: Orthopedic Surgery

## 2022-06-27 DIAGNOSIS — R279 Unspecified lack of coordination: Secondary | ICD-10-CM | POA: Diagnosis not present

## 2022-06-27 DIAGNOSIS — M6281 Muscle weakness (generalized): Secondary | ICD-10-CM | POA: Diagnosis not present

## 2022-06-27 DIAGNOSIS — R2689 Other abnormalities of gait and mobility: Secondary | ICD-10-CM | POA: Diagnosis not present

## 2022-07-02 ENCOUNTER — Encounter (HOSPITAL_COMMUNITY): Payer: Self-pay | Admitting: Physician Assistant

## 2022-07-03 DIAGNOSIS — M6281 Muscle weakness (generalized): Secondary | ICD-10-CM | POA: Diagnosis not present

## 2022-07-03 DIAGNOSIS — R279 Unspecified lack of coordination: Secondary | ICD-10-CM | POA: Diagnosis not present

## 2022-07-03 DIAGNOSIS — R2689 Other abnormalities of gait and mobility: Secondary | ICD-10-CM | POA: Diagnosis not present

## 2022-07-04 ENCOUNTER — Encounter (HOSPITAL_COMMUNITY): Payer: Self-pay | Admitting: Orthopedic Surgery

## 2022-07-05 DIAGNOSIS — R2689 Other abnormalities of gait and mobility: Secondary | ICD-10-CM | POA: Diagnosis not present

## 2022-07-05 DIAGNOSIS — M6281 Muscle weakness (generalized): Secondary | ICD-10-CM | POA: Diagnosis not present

## 2022-07-05 DIAGNOSIS — R279 Unspecified lack of coordination: Secondary | ICD-10-CM | POA: Diagnosis not present

## 2022-07-09 DIAGNOSIS — M159 Polyosteoarthritis, unspecified: Secondary | ICD-10-CM | POA: Diagnosis not present

## 2022-07-09 DIAGNOSIS — I129 Hypertensive chronic kidney disease with stage 1 through stage 4 chronic kidney disease, or unspecified chronic kidney disease: Secondary | ICD-10-CM | POA: Diagnosis not present

## 2022-07-09 DIAGNOSIS — N401 Enlarged prostate with lower urinary tract symptoms: Secondary | ICD-10-CM | POA: Diagnosis not present

## 2022-07-09 DIAGNOSIS — N1831 Chronic kidney disease, stage 3a: Secondary | ICD-10-CM | POA: Diagnosis not present

## 2022-07-09 DIAGNOSIS — K219 Gastro-esophageal reflux disease without esophagitis: Secondary | ICD-10-CM | POA: Diagnosis not present

## 2022-07-10 DIAGNOSIS — M6259 Muscle wasting and atrophy, not elsewhere classified, multiple sites: Secondary | ICD-10-CM | POA: Diagnosis not present

## 2022-07-10 DIAGNOSIS — R279 Unspecified lack of coordination: Secondary | ICD-10-CM | POA: Diagnosis not present

## 2022-07-10 DIAGNOSIS — R2689 Other abnormalities of gait and mobility: Secondary | ICD-10-CM | POA: Diagnosis not present

## 2022-07-10 DIAGNOSIS — R2681 Unsteadiness on feet: Secondary | ICD-10-CM | POA: Diagnosis not present
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
## 2022-07-11 ENCOUNTER — Ambulatory Visit (HOSPITAL_COMMUNITY): Admission: RE | Admit: 2022-07-11 | Payer: Medicare Other | Source: Home / Self Care | Admitting: Orthopedic Surgery

## 2022-07-11 ENCOUNTER — Encounter (HOSPITAL_COMMUNITY): Admission: RE | Payer: Self-pay | Source: Home / Self Care

## 2022-07-11 HISTORY — DX: Constipation, unspecified: K59.00

## 2022-07-11 HISTORY — DX: Polyneuropathy, unspecified: G62.9

## 2022-07-11 HISTORY — DX: Overactive bladder: N32.81

## 2022-07-11 SURGERY — ARTHROPLASTY, SHOULDER, TOTAL, REVERSE
Anesthesia: Choice | Site: Shoulder | Laterality: Right

## 2022-07-12 DIAGNOSIS — R2689 Other abnormalities of gait and mobility: Secondary | ICD-10-CM | POA: Diagnosis not present

## 2022-07-12 DIAGNOSIS — R2681 Unsteadiness on feet: Secondary | ICD-10-CM | POA: Diagnosis not present

## 2022-07-12 DIAGNOSIS — R279 Unspecified lack of coordination: Secondary | ICD-10-CM | POA: Diagnosis not present

## 2022-07-12 DIAGNOSIS — M6259 Muscle wasting and atrophy, not elsewhere classified, multiple sites: Secondary | ICD-10-CM | POA: Diagnosis not present

## 2022-07-16 DIAGNOSIS — M6259 Muscle wasting and atrophy, not elsewhere classified, multiple sites: Secondary | ICD-10-CM | POA: Diagnosis not present

## 2022-07-16 DIAGNOSIS — R2689 Other abnormalities of gait and mobility: Secondary | ICD-10-CM | POA: Diagnosis not present

## 2022-07-16 DIAGNOSIS — R2681 Unsteadiness on feet: Secondary | ICD-10-CM | POA: Diagnosis not present

## 2022-07-16 DIAGNOSIS — R279 Unspecified lack of coordination: Secondary | ICD-10-CM | POA: Diagnosis not present

## 2022-07-17 DIAGNOSIS — R2681 Unsteadiness on feet: Secondary | ICD-10-CM | POA: Diagnosis not present

## 2022-07-17 DIAGNOSIS — R2689 Other abnormalities of gait and mobility: Secondary | ICD-10-CM | POA: Diagnosis not present

## 2022-07-17 DIAGNOSIS — M6259 Muscle wasting and atrophy, not elsewhere classified, multiple sites: Secondary | ICD-10-CM | POA: Diagnosis not present

## 2022-07-17 DIAGNOSIS — R279 Unspecified lack of coordination: Secondary | ICD-10-CM | POA: Diagnosis not present

## 2022-07-23 DIAGNOSIS — R279 Unspecified lack of coordination: Secondary | ICD-10-CM | POA: Diagnosis not present

## 2022-07-23 DIAGNOSIS — M6259 Muscle wasting and atrophy, not elsewhere classified, multiple sites: Secondary | ICD-10-CM | POA: Diagnosis not present

## 2022-07-23 DIAGNOSIS — R2689 Other abnormalities of gait and mobility: Secondary | ICD-10-CM | POA: Diagnosis not present

## 2022-07-23 DIAGNOSIS — R2681 Unsteadiness on feet: Secondary | ICD-10-CM | POA: Diagnosis not present

## 2022-07-25 DIAGNOSIS — M6259 Muscle wasting and atrophy, not elsewhere classified, multiple sites: Secondary | ICD-10-CM | POA: Diagnosis not present

## 2022-07-25 DIAGNOSIS — R2689 Other abnormalities of gait and mobility: Secondary | ICD-10-CM | POA: Diagnosis not present

## 2022-07-25 DIAGNOSIS — R2681 Unsteadiness on feet: Secondary | ICD-10-CM | POA: Diagnosis not present

## 2022-07-25 DIAGNOSIS — R279 Unspecified lack of coordination: Secondary | ICD-10-CM | POA: Diagnosis not present

## 2022-07-29 DIAGNOSIS — R2689 Other abnormalities of gait and mobility: Secondary | ICD-10-CM | POA: Diagnosis not present

## 2022-07-29 DIAGNOSIS — M6259 Muscle wasting and atrophy, not elsewhere classified, multiple sites: Secondary | ICD-10-CM | POA: Diagnosis not present

## 2022-07-29 DIAGNOSIS — R2681 Unsteadiness on feet: Secondary | ICD-10-CM | POA: Diagnosis not present

## 2022-07-29 DIAGNOSIS — R279 Unspecified lack of coordination: Secondary | ICD-10-CM | POA: Diagnosis not present

## 2022-07-30 DIAGNOSIS — R279 Unspecified lack of coordination: Secondary | ICD-10-CM | POA: Diagnosis not present

## 2022-07-30 DIAGNOSIS — M6259 Muscle wasting and atrophy, not elsewhere classified, multiple sites: Secondary | ICD-10-CM | POA: Diagnosis not present

## 2022-07-30 DIAGNOSIS — R2681 Unsteadiness on feet: Secondary | ICD-10-CM | POA: Diagnosis not present

## 2022-07-30 DIAGNOSIS — R2689 Other abnormalities of gait and mobility: Secondary | ICD-10-CM | POA: Diagnosis not present

## 2022-08-02 DIAGNOSIS — I129 Hypertensive chronic kidney disease with stage 1 through stage 4 chronic kidney disease, or unspecified chronic kidney disease: Secondary | ICD-10-CM | POA: Diagnosis not present

## 2022-08-02 DIAGNOSIS — M6259 Muscle wasting and atrophy, not elsewhere classified, multiple sites: Secondary | ICD-10-CM | POA: Diagnosis not present

## 2022-08-02 DIAGNOSIS — R2689 Other abnormalities of gait and mobility: Secondary | ICD-10-CM | POA: Diagnosis not present

## 2022-08-02 DIAGNOSIS — R279 Unspecified lack of coordination: Secondary | ICD-10-CM | POA: Diagnosis not present

## 2022-08-02 DIAGNOSIS — R2681 Unsteadiness on feet: Secondary | ICD-10-CM | POA: Diagnosis not present

## 2022-08-02 DIAGNOSIS — M19111 Post-traumatic osteoarthritis, right shoulder: Secondary | ICD-10-CM | POA: Diagnosis not present

## 2022-08-06 DIAGNOSIS — G4700 Insomnia, unspecified: Secondary | ICD-10-CM | POA: Diagnosis not present

## 2022-08-06 DIAGNOSIS — M19011 Primary osteoarthritis, right shoulder: Secondary | ICD-10-CM | POA: Diagnosis not present

## 2022-08-06 DIAGNOSIS — N1831 Chronic kidney disease, stage 3a: Secondary | ICD-10-CM | POA: Diagnosis not present

## 2022-08-06 DIAGNOSIS — M6259 Muscle wasting and atrophy, not elsewhere classified, multiple sites: Secondary | ICD-10-CM | POA: Diagnosis not present

## 2022-08-06 DIAGNOSIS — R2681 Unsteadiness on feet: Secondary | ICD-10-CM | POA: Diagnosis not present

## 2022-08-06 DIAGNOSIS — Z79899 Other long term (current) drug therapy: Secondary | ICD-10-CM | POA: Diagnosis not present

## 2022-08-06 DIAGNOSIS — I129 Hypertensive chronic kidney disease with stage 1 through stage 4 chronic kidney disease, or unspecified chronic kidney disease: Secondary | ICD-10-CM | POA: Diagnosis not present

## 2022-08-06 DIAGNOSIS — R2689 Other abnormalities of gait and mobility: Secondary | ICD-10-CM | POA: Diagnosis not present

## 2022-08-06 DIAGNOSIS — R279 Unspecified lack of coordination: Secondary | ICD-10-CM | POA: Diagnosis not present

## 2022-08-21 DIAGNOSIS — Z993 Dependence on wheelchair: Secondary | ICD-10-CM | POA: Diagnosis not present

## 2022-08-21 DIAGNOSIS — I129 Hypertensive chronic kidney disease with stage 1 through stage 4 chronic kidney disease, or unspecified chronic kidney disease: Secondary | ICD-10-CM | POA: Diagnosis not present

## 2022-08-21 DIAGNOSIS — N401 Enlarged prostate with lower urinary tract symptoms: Secondary | ICD-10-CM | POA: Diagnosis not present

## 2022-08-21 DIAGNOSIS — K219 Gastro-esophageal reflux disease without esophagitis: Secondary | ICD-10-CM | POA: Diagnosis not present

## 2022-08-21 DIAGNOSIS — N1831 Chronic kidney disease, stage 3a: Secondary | ICD-10-CM | POA: Diagnosis not present

## 2022-08-21 DIAGNOSIS — M19111 Post-traumatic osteoarthritis, right shoulder: Secondary | ICD-10-CM | POA: Diagnosis not present

## 2022-08-30 DIAGNOSIS — I2510 Atherosclerotic heart disease of native coronary artery without angina pectoris: Secondary | ICD-10-CM | POA: Diagnosis not present

## 2022-08-30 DIAGNOSIS — I10 Essential (primary) hypertension: Secondary | ICD-10-CM | POA: Diagnosis not present

## 2022-09-03 DIAGNOSIS — E785 Hyperlipidemia, unspecified: Secondary | ICD-10-CM | POA: Diagnosis not present

## 2022-09-03 DIAGNOSIS — N1831 Chronic kidney disease, stage 3a: Secondary | ICD-10-CM | POA: Diagnosis not present

## 2022-09-03 DIAGNOSIS — I129 Hypertensive chronic kidney disease with stage 1 through stage 4 chronic kidney disease, or unspecified chronic kidney disease: Secondary | ICD-10-CM | POA: Diagnosis not present

## 2022-09-03 DIAGNOSIS — K219 Gastro-esophageal reflux disease without esophagitis: Secondary | ICD-10-CM | POA: Diagnosis not present

## 2022-09-03 DIAGNOSIS — Z79899 Other long term (current) drug therapy: Secondary | ICD-10-CM | POA: Diagnosis not present

## 2022-09-05 ENCOUNTER — Encounter: Payer: Self-pay | Admitting: Cardiovascular Disease

## 2022-09-05 ENCOUNTER — Ambulatory Visit: Payer: Medicare Other | Attending: Cardiovascular Disease | Admitting: Cardiovascular Disease

## 2022-09-05 ENCOUNTER — Telehealth: Payer: Self-pay | Admitting: Emergency Medicine

## 2022-09-05 VITALS — BP 120/74 | HR 59 | Ht 69.0 in | Wt 164.8 lb

## 2022-09-05 DIAGNOSIS — I10 Essential (primary) hypertension: Secondary | ICD-10-CM | POA: Diagnosis not present

## 2022-09-05 DIAGNOSIS — I701 Atherosclerosis of renal artery: Secondary | ICD-10-CM

## 2022-09-05 DIAGNOSIS — E782 Mixed hyperlipidemia: Secondary | ICD-10-CM | POA: Diagnosis not present

## 2022-09-05 DIAGNOSIS — I7121 Aneurysm of the ascending aorta, without rupture: Secondary | ICD-10-CM | POA: Diagnosis not present

## 2022-09-05 DIAGNOSIS — I4719 Other supraventricular tachycardia: Secondary | ICD-10-CM

## 2022-09-05 DIAGNOSIS — I441 Atrioventricular block, second degree: Secondary | ICD-10-CM | POA: Diagnosis not present

## 2022-09-05 DIAGNOSIS — Z01810 Encounter for preprocedural cardiovascular examination: Secondary | ICD-10-CM | POA: Diagnosis not present

## 2022-09-05 DIAGNOSIS — I951 Orthostatic hypotension: Secondary | ICD-10-CM

## 2022-09-05 DIAGNOSIS — I495 Sick sinus syndrome: Secondary | ICD-10-CM | POA: Diagnosis not present

## 2022-09-05 DIAGNOSIS — Z950 Presence of cardiac pacemaker: Secondary | ICD-10-CM | POA: Diagnosis not present

## 2022-09-05 DIAGNOSIS — I7143 Infrarenal abdominal aortic aneurysm, without rupture: Secondary | ICD-10-CM | POA: Diagnosis not present

## 2022-09-05 DIAGNOSIS — I2510 Atherosclerotic heart disease of native coronary artery without angina pectoris: Secondary | ICD-10-CM | POA: Diagnosis not present

## 2022-09-05 NOTE — Patient Instructions (Addendum)
Medication Instructions:  No changes *If you need a refill on your cardiac medications before your next appointment, please call your pharmacy*   Lab Work: Lipid panel- please draw lipid panel at [facility]isted [facility] Facility If you have labs (blood work) drawn today and your tests are completely normal, you will receive your results only by: MyChart Message (if you have MyChart) OR A paper copy in the mail If you have any lab test that is abnormal or we need to change your treatment, we will call you to review the results.  Follow-Up: At [facility], you and your health needs are our priority.  As part of our continuing mission to provide you with exceptional heart care, we have created designated Provider Care Teams.  These Care Teams include your primary Cardiologist (physician) and Advanced Practice Providers (APPs -  Physician Assistants and Nurse Practitioners) who all work together to provide you with the care you need, when you need it.  We recommend signing up for the patient portal called "MyChart".  Sign up information is provided on this After Visit Summary.  MyChart is used to connect with patients for Virtual Visits (Telemedicine).  Patients are able to view lab/test results, encounter notes, upcoming appointments, etc.  Non-urgent messages can be sent to your provider as well.   To learn more about what you can do with MyChart, go to [email or web address].    Your next appointment:   6 MONTHS   Provider:   Thurmon Fair, MD
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
## 2022-09-05 NOTE — Telephone Encounter (Signed)
Faxed request to PCP for most recent lipid panel results

## 2022-09-05 NOTE — Progress Notes (Signed)
Changed Follow up from 1 year to 6 months

## 2022-09-10 DIAGNOSIS — E782 Mixed hyperlipidemia: Secondary | ICD-10-CM | POA: Diagnosis not present

## 2022-09-24 ENCOUNTER — Encounter: Payer: Self-pay | Admitting: Internal Medicine

## 2022-10-01 DIAGNOSIS — M19011 Primary osteoarthritis, right shoulder: Secondary | ICD-10-CM | POA: Diagnosis not present

## 2022-10-01 DIAGNOSIS — I129 Hypertensive chronic kidney disease with stage 1 through stage 4 chronic kidney disease, or unspecified chronic kidney disease: Secondary | ICD-10-CM | POA: Diagnosis not present

## 2022-10-01 DIAGNOSIS — N1831 Chronic kidney disease, stage 3a: Secondary | ICD-10-CM | POA: Diagnosis not present

## 2022-10-01 DIAGNOSIS — Z79899 Other long term (current) drug therapy: Secondary | ICD-10-CM | POA: Diagnosis not present

## 2022-10-04 DIAGNOSIS — I2510 Atherosclerotic heart disease of native coronary artery without angina pectoris: Secondary | ICD-10-CM | POA: Diagnosis not present

## 2022-10-04 DIAGNOSIS — I10 Essential (primary) hypertension: Secondary | ICD-10-CM | POA: Diagnosis not present

## 2022-10-08 DIAGNOSIS — M62511 Muscle wasting and atrophy, not elsewhere classified, right shoulder: Secondary | ICD-10-CM | POA: Diagnosis not present

## 2022-10-08 DIAGNOSIS — M6259 Muscle wasting and atrophy, not elsewhere classified, multiple sites: Secondary | ICD-10-CM | POA: Diagnosis not present

## 2022-10-08 DIAGNOSIS — R2681 Unsteadiness on feet: Secondary | ICD-10-CM | POA: Diagnosis not present

## 2022-10-08 DIAGNOSIS — R2689 Other abnormalities of gait and mobility: Secondary | ICD-10-CM | POA: Diagnosis not present

## 2022-10-08 DIAGNOSIS — R278 Other lack of coordination: Secondary | ICD-10-CM | POA: Diagnosis not present

## 2022-10-10 DIAGNOSIS — R278 Other lack of coordination: Secondary | ICD-10-CM | POA: Diagnosis not present

## 2022-10-10 DIAGNOSIS — R2681 Unsteadiness on feet: Secondary | ICD-10-CM | POA: Diagnosis not present

## 2022-10-10 DIAGNOSIS — M6259 Muscle wasting and atrophy, not elsewhere classified, multiple sites: Secondary | ICD-10-CM | POA: Diagnosis not present

## 2022-10-10 DIAGNOSIS — R2689 Other abnormalities of gait and mobility: Secondary | ICD-10-CM | POA: Diagnosis not present

## 2022-10-10 DIAGNOSIS — M62511 Muscle wasting and atrophy, not elsewhere classified, right shoulder: Secondary | ICD-10-CM | POA: Diagnosis not present

## 2022-10-14 DIAGNOSIS — M62511 Muscle wasting and atrophy, not elsewhere classified, right shoulder: Secondary | ICD-10-CM | POA: Diagnosis not present

## 2022-10-14 DIAGNOSIS — R278 Other lack of coordination: Secondary | ICD-10-CM | POA: Diagnosis not present

## 2022-10-14 DIAGNOSIS — R2689 Other abnormalities of gait and mobility: Secondary | ICD-10-CM | POA: Diagnosis not present

## 2022-10-14 DIAGNOSIS — R2681 Unsteadiness on feet: Secondary | ICD-10-CM | POA: Diagnosis not present

## 2022-10-14 DIAGNOSIS — M6259 Muscle wasting and atrophy, not elsewhere classified, multiple sites: Secondary | ICD-10-CM | POA: Diagnosis not present

## 2022-10-15 DIAGNOSIS — R2689 Other abnormalities of gait and mobility: Secondary | ICD-10-CM | POA: Diagnosis not present

## 2022-10-15 DIAGNOSIS — G4700 Insomnia, unspecified: Secondary | ICD-10-CM | POA: Diagnosis not present

## 2022-10-15 DIAGNOSIS — N1831 Chronic kidney disease, stage 3a: Secondary | ICD-10-CM | POA: Diagnosis not present

## 2022-10-15 DIAGNOSIS — I129 Hypertensive chronic kidney disease with stage 1 through stage 4 chronic kidney disease, or unspecified chronic kidney disease: Secondary | ICD-10-CM | POA: Diagnosis not present

## 2022-10-15 DIAGNOSIS — R278 Other lack of coordination: Secondary | ICD-10-CM | POA: Diagnosis not present

## 2022-10-15 DIAGNOSIS — M19011 Primary osteoarthritis, right shoulder: Secondary | ICD-10-CM | POA: Diagnosis not present

## 2022-10-15 DIAGNOSIS — R2681 Unsteadiness on feet: Secondary | ICD-10-CM | POA: Diagnosis not present

## 2022-10-15 DIAGNOSIS — M6259 Muscle wasting and atrophy, not elsewhere classified, multiple sites: Secondary | ICD-10-CM | POA: Diagnosis not present

## 2022-10-15 DIAGNOSIS — M62511 Muscle wasting and atrophy, not elsewhere classified, right shoulder: Secondary | ICD-10-CM | POA: Diagnosis not present

## 2022-10-16 DIAGNOSIS — R278 Other lack of coordination: Secondary | ICD-10-CM | POA: Diagnosis not present

## 2022-10-16 DIAGNOSIS — M62511 Muscle wasting and atrophy, not elsewhere classified, right shoulder: Secondary | ICD-10-CM | POA: Diagnosis not present

## 2022-10-16 DIAGNOSIS — R2681 Unsteadiness on feet: Secondary | ICD-10-CM | POA: Diagnosis not present

## 2022-10-16 DIAGNOSIS — M6259 Muscle wasting and atrophy, not elsewhere classified, multiple sites: Secondary | ICD-10-CM | POA: Diagnosis not present

## 2022-10-16 DIAGNOSIS — R2689 Other abnormalities of gait and mobility: Secondary | ICD-10-CM | POA: Diagnosis not present

## 2022-10-17 DIAGNOSIS — R2689 Other abnormalities of gait and mobility: Secondary | ICD-10-CM | POA: Diagnosis not present

## 2022-10-17 DIAGNOSIS — R2681 Unsteadiness on feet: Secondary | ICD-10-CM | POA: Diagnosis not present

## 2022-10-17 DIAGNOSIS — M6259 Muscle wasting and atrophy, not elsewhere classified, multiple sites: Secondary | ICD-10-CM | POA: Diagnosis not present

## 2022-10-17 DIAGNOSIS — R278 Other lack of coordination: Secondary | ICD-10-CM | POA: Diagnosis not present

## 2022-10-17 DIAGNOSIS — M62511 Muscle wasting and atrophy, not elsewhere classified, right shoulder: Secondary | ICD-10-CM | POA: Diagnosis not present

## 2022-10-18 ENCOUNTER — Telehealth: Payer: Self-pay | Admitting: Cardiovascular Disease

## 2022-10-18 DIAGNOSIS — R2689 Other abnormalities of gait and mobility: Secondary | ICD-10-CM | POA: Diagnosis not present

## 2022-10-18 DIAGNOSIS — R278 Other lack of coordination: Secondary | ICD-10-CM | POA: Diagnosis not present

## 2022-10-18 DIAGNOSIS — M62511 Muscle wasting and atrophy, not elsewhere classified, right shoulder: Secondary | ICD-10-CM | POA: Diagnosis not present

## 2022-10-18 DIAGNOSIS — M6259 Muscle wasting and atrophy, not elsewhere classified, multiple sites: Secondary | ICD-10-CM | POA: Diagnosis not present

## 2022-10-18 DIAGNOSIS — R2681 Unsteadiness on feet: Secondary | ICD-10-CM | POA: Diagnosis not present

## 2022-10-18 NOTE — Telephone Encounter (Signed)
Spoke w/pt to verify mailing address (returned letter) & to attempt to schedule appt w/Dr. C per recalls. Pt stated that he lives in a facility & to speak with his wife or son for detailed info.   I called pt's wife - no answer & unable to leave vm due to vm box full. Was unable to contact pt's son.   JB, 10-18-22

## 2022-10-21 DIAGNOSIS — M62511 Muscle wasting and atrophy, not elsewhere classified, right shoulder: Secondary | ICD-10-CM | POA: Diagnosis not present

## 2022-10-21 DIAGNOSIS — R2689 Other abnormalities of gait and mobility: Secondary | ICD-10-CM | POA: Diagnosis not present

## 2022-10-21 DIAGNOSIS — M6259 Muscle wasting and atrophy, not elsewhere classified, multiple sites: Secondary | ICD-10-CM | POA: Diagnosis not present

## 2022-10-21 DIAGNOSIS — R2681 Unsteadiness on feet: Secondary | ICD-10-CM | POA: Diagnosis not present

## 2022-10-21 DIAGNOSIS — R278 Other lack of coordination: Secondary | ICD-10-CM | POA: Diagnosis not present

## 2022-10-22 DIAGNOSIS — M6259 Muscle wasting and atrophy, not elsewhere classified, multiple sites: Secondary | ICD-10-CM | POA: Diagnosis not present

## 2022-10-22 DIAGNOSIS — R2689 Other abnormalities of gait and mobility: Secondary | ICD-10-CM | POA: Diagnosis not present

## 2022-10-22 DIAGNOSIS — R2681 Unsteadiness on feet: Secondary | ICD-10-CM | POA: Diagnosis not present

## 2022-10-22 DIAGNOSIS — M62511 Muscle wasting and atrophy, not elsewhere classified, right shoulder: Secondary | ICD-10-CM | POA: Diagnosis not present

## 2022-10-22 DIAGNOSIS — R278 Other lack of coordination: Secondary | ICD-10-CM | POA: Diagnosis not present

## 2022-10-23 ENCOUNTER — Telehealth: Payer: Self-pay | Admitting: Emergency Medicine

## 2022-10-23 DIAGNOSIS — R2681 Unsteadiness on feet: Secondary | ICD-10-CM | POA: Diagnosis not present

## 2022-10-23 DIAGNOSIS — R2689 Other abnormalities of gait and mobility: Secondary | ICD-10-CM | POA: Diagnosis not present

## 2022-10-23 DIAGNOSIS — R278 Other lack of coordination: Secondary | ICD-10-CM | POA: Diagnosis not present

## 2022-10-23 DIAGNOSIS — M62511 Muscle wasting and atrophy, not elsewhere classified, right shoulder: Secondary | ICD-10-CM | POA: Diagnosis not present

## 2022-10-23 DIAGNOSIS — M6259 Muscle wasting and atrophy, not elsewhere classified, multiple sites: Secondary | ICD-10-CM | POA: Diagnosis not present

## 2022-10-23 NOTE — Telephone Encounter (Signed)
Left message- Trying to get this pt's (his father's) most recent lab work and the last PCP we have on file reports that he transferred care in March 2024 and last lipid panel was in 2022. Need to know his current PCP so we can request most recent lipid panel.

## 2022-10-23 NOTE — Telephone Encounter (Signed)
Informed that this patient was transferred out of this practice in March 2024 and the most recent Lipid panel is from 2022

## 2022-10-23 NOTE — Telephone Encounter (Signed)
Requesting most recent Lipid panel- Called and left message on voicemail of medical records.  Also faxed a request for most recent lipid panel 09/05/22 and did not get a reply

## 2022-10-24 DIAGNOSIS — R2689 Other abnormalities of gait and mobility: Secondary | ICD-10-CM | POA: Diagnosis not present

## 2022-10-24 DIAGNOSIS — R278 Other lack of coordination: Secondary | ICD-10-CM | POA: Diagnosis not present

## 2022-10-24 DIAGNOSIS — M6259 Muscle wasting and atrophy, not elsewhere classified, multiple sites: Secondary | ICD-10-CM | POA: Diagnosis not present

## 2022-10-24 DIAGNOSIS — M62511 Muscle wasting and atrophy, not elsewhere classified, right shoulder: Secondary | ICD-10-CM | POA: Diagnosis not present

## 2022-10-24 DIAGNOSIS — R2681 Unsteadiness on feet: Secondary | ICD-10-CM | POA: Diagnosis not present

## 2022-10-25 DIAGNOSIS — R2689 Other abnormalities of gait and mobility: Secondary | ICD-10-CM | POA: Diagnosis not present

## 2022-10-25 DIAGNOSIS — R278 Other lack of coordination: Secondary | ICD-10-CM | POA: Diagnosis not present

## 2022-10-25 DIAGNOSIS — M62511 Muscle wasting and atrophy, not elsewhere classified, right shoulder: Secondary | ICD-10-CM | POA: Diagnosis not present

## 2022-10-25 DIAGNOSIS — R2681 Unsteadiness on feet: Secondary | ICD-10-CM | POA: Diagnosis not present

## 2022-10-25 DIAGNOSIS — M6259 Muscle wasting and atrophy, not elsewhere classified, multiple sites: Secondary | ICD-10-CM | POA: Diagnosis not present

## 2022-10-28 DIAGNOSIS — M6259 Muscle wasting and atrophy, not elsewhere classified, multiple sites: Secondary | ICD-10-CM | POA: Diagnosis not present

## 2022-10-28 DIAGNOSIS — R2681 Unsteadiness on feet: Secondary | ICD-10-CM | POA: Diagnosis not present

## 2022-10-28 DIAGNOSIS — R2689 Other abnormalities of gait and mobility: Secondary | ICD-10-CM | POA: Diagnosis not present

## 2022-10-28 DIAGNOSIS — M62511 Muscle wasting and atrophy, not elsewhere classified, right shoulder: Secondary | ICD-10-CM | POA: Diagnosis not present

## 2022-10-28 DIAGNOSIS — R278 Other lack of coordination: Secondary | ICD-10-CM | POA: Diagnosis not present

## 2022-10-29 DIAGNOSIS — R2689 Other abnormalities of gait and mobility: Secondary | ICD-10-CM | POA: Diagnosis not present

## 2022-10-29 DIAGNOSIS — M62511 Muscle wasting and atrophy, not elsewhere classified, right shoulder: Secondary | ICD-10-CM | POA: Diagnosis not present

## 2022-10-29 DIAGNOSIS — K219 Gastro-esophageal reflux disease without esophagitis: Secondary | ICD-10-CM | POA: Diagnosis not present

## 2022-10-29 DIAGNOSIS — G4700 Insomnia, unspecified: Secondary | ICD-10-CM | POA: Diagnosis not present

## 2022-10-29 DIAGNOSIS — N401 Enlarged prostate with lower urinary tract symptoms: Secondary | ICD-10-CM | POA: Diagnosis not present

## 2022-10-29 DIAGNOSIS — R2681 Unsteadiness on feet: Secondary | ICD-10-CM | POA: Diagnosis not present

## 2022-10-29 DIAGNOSIS — M6259 Muscle wasting and atrophy, not elsewhere classified, multiple sites: Secondary | ICD-10-CM | POA: Diagnosis not present

## 2022-10-29 DIAGNOSIS — R278 Other lack of coordination: Secondary | ICD-10-CM | POA: Diagnosis not present

## 2022-10-30 DIAGNOSIS — R2681 Unsteadiness on feet: Secondary | ICD-10-CM | POA: Diagnosis not present

## 2022-10-30 DIAGNOSIS — R2689 Other abnormalities of gait and mobility: Secondary | ICD-10-CM | POA: Diagnosis not present

## 2022-10-30 DIAGNOSIS — M62511 Muscle wasting and atrophy, not elsewhere classified, right shoulder: Secondary | ICD-10-CM | POA: Diagnosis not present

## 2022-10-30 DIAGNOSIS — R278 Other lack of coordination: Secondary | ICD-10-CM | POA: Diagnosis not present

## 2022-10-30 DIAGNOSIS — I2510 Atherosclerotic heart disease of native coronary artery without angina pectoris: Secondary | ICD-10-CM | POA: Diagnosis not present

## 2022-10-30 DIAGNOSIS — I10 Essential (primary) hypertension: Secondary | ICD-10-CM | POA: Diagnosis not present

## 2022-10-30 DIAGNOSIS — M6259 Muscle wasting and atrophy, not elsewhere classified, multiple sites: Secondary | ICD-10-CM | POA: Diagnosis not present

## 2022-10-31 DIAGNOSIS — M6259 Muscle wasting and atrophy, not elsewhere classified, multiple sites: Secondary | ICD-10-CM | POA: Diagnosis not present

## 2022-10-31 DIAGNOSIS — M62511 Muscle wasting and atrophy, not elsewhere classified, right shoulder: Secondary | ICD-10-CM | POA: Diagnosis not present

## 2022-10-31 DIAGNOSIS — R2681 Unsteadiness on feet: Secondary | ICD-10-CM | POA: Diagnosis not present

## 2022-10-31 DIAGNOSIS — R2689 Other abnormalities of gait and mobility: Secondary | ICD-10-CM | POA: Diagnosis not present

## 2022-10-31 DIAGNOSIS — R278 Other lack of coordination: Secondary | ICD-10-CM | POA: Diagnosis not present

## 2022-11-04 DIAGNOSIS — R278 Other lack of coordination: Secondary | ICD-10-CM | POA: Diagnosis not present

## 2022-11-04 DIAGNOSIS — R2681 Unsteadiness on feet: Secondary | ICD-10-CM | POA: Diagnosis not present

## 2022-11-04 DIAGNOSIS — M6259 Muscle wasting and atrophy, not elsewhere classified, multiple sites: Secondary | ICD-10-CM | POA: Diagnosis not present

## 2022-11-04 DIAGNOSIS — M62511 Muscle wasting and atrophy, not elsewhere classified, right shoulder: Secondary | ICD-10-CM | POA: Diagnosis not present

## 2022-11-04 DIAGNOSIS — R2689 Other abnormalities of gait and mobility: Secondary | ICD-10-CM | POA: Diagnosis not present

## 2022-11-05 DIAGNOSIS — R278 Other lack of coordination: Secondary | ICD-10-CM | POA: Diagnosis not present

## 2022-11-05 DIAGNOSIS — R2681 Unsteadiness on feet: Secondary | ICD-10-CM | POA: Diagnosis not present

## 2022-11-05 DIAGNOSIS — M62511 Muscle wasting and atrophy, not elsewhere classified, right shoulder: Secondary | ICD-10-CM | POA: Diagnosis not present

## 2022-11-05 DIAGNOSIS — R2689 Other abnormalities of gait and mobility: Secondary | ICD-10-CM | POA: Diagnosis not present

## 2022-11-05 DIAGNOSIS — M6259 Muscle wasting and atrophy, not elsewhere classified, multiple sites: Secondary | ICD-10-CM | POA: Diagnosis not present

## 2022-11-06 DIAGNOSIS — R2681 Unsteadiness on feet: Secondary | ICD-10-CM | POA: Diagnosis not present

## 2022-11-06 DIAGNOSIS — M6259 Muscle wasting and atrophy, not elsewhere classified, multiple sites: Secondary | ICD-10-CM | POA: Diagnosis not present

## 2022-11-06 DIAGNOSIS — R278 Other lack of coordination: Secondary | ICD-10-CM | POA: Diagnosis not present

## 2022-11-06 DIAGNOSIS — M62511 Muscle wasting and atrophy, not elsewhere classified, right shoulder: Secondary | ICD-10-CM | POA: Diagnosis not present

## 2022-11-06 DIAGNOSIS — R2689 Other abnormalities of gait and mobility: Secondary | ICD-10-CM | POA: Diagnosis not present

## 2022-11-07 DIAGNOSIS — R2681 Unsteadiness on feet: Secondary | ICD-10-CM | POA: Diagnosis not present

## 2022-11-07 DIAGNOSIS — R278 Other lack of coordination: Secondary | ICD-10-CM | POA: Diagnosis not present

## 2022-11-07 DIAGNOSIS — M62511 Muscle wasting and atrophy, not elsewhere classified, right shoulder: Secondary | ICD-10-CM | POA: Diagnosis not present

## 2022-11-07 DIAGNOSIS — R2689 Other abnormalities of gait and mobility: Secondary | ICD-10-CM | POA: Diagnosis not present

## 2022-11-07 DIAGNOSIS — M6259 Muscle wasting and atrophy, not elsewhere classified, multiple sites: Secondary | ICD-10-CM | POA: Diagnosis not present

## 2022-11-08 DIAGNOSIS — M62511 Muscle wasting and atrophy, not elsewhere classified, right shoulder: Secondary | ICD-10-CM | POA: Diagnosis not present

## 2022-11-08 DIAGNOSIS — M6259 Muscle wasting and atrophy, not elsewhere classified, multiple sites: Secondary | ICD-10-CM | POA: Diagnosis not present

## 2022-11-08 DIAGNOSIS — R278 Other lack of coordination: Secondary | ICD-10-CM | POA: Diagnosis not present

## 2022-11-08 DIAGNOSIS — R2681 Unsteadiness on feet: Secondary | ICD-10-CM | POA: Diagnosis not present

## 2022-11-08 DIAGNOSIS — R2689 Other abnormalities of gait and mobility: Secondary | ICD-10-CM | POA: Diagnosis not present

## 2022-11-11 DIAGNOSIS — M6259 Muscle wasting and atrophy, not elsewhere classified, multiple sites: Secondary | ICD-10-CM | POA: Diagnosis not present

## 2022-11-11 DIAGNOSIS — R278 Other lack of coordination: Secondary | ICD-10-CM | POA: Diagnosis not present

## 2022-11-11 DIAGNOSIS — R2681 Unsteadiness on feet: Secondary | ICD-10-CM | POA: Diagnosis not present

## 2022-11-11 DIAGNOSIS — R2689 Other abnormalities of gait and mobility: Secondary | ICD-10-CM | POA: Diagnosis not present

## 2022-11-11 DIAGNOSIS — M62511 Muscle wasting and atrophy, not elsewhere classified, right shoulder: Secondary | ICD-10-CM | POA: Diagnosis not present

## 2022-11-12 DIAGNOSIS — R278 Other lack of coordination: Secondary | ICD-10-CM | POA: Diagnosis not present

## 2022-11-12 DIAGNOSIS — R2681 Unsteadiness on feet: Secondary | ICD-10-CM | POA: Diagnosis not present

## 2022-11-12 DIAGNOSIS — M6259 Muscle wasting and atrophy, not elsewhere classified, multiple sites: Secondary | ICD-10-CM | POA: Diagnosis not present

## 2022-11-12 DIAGNOSIS — R2689 Other abnormalities of gait and mobility: Secondary | ICD-10-CM | POA: Diagnosis not present

## 2022-11-12 DIAGNOSIS — M62511 Muscle wasting and atrophy, not elsewhere classified, right shoulder: Secondary | ICD-10-CM | POA: Diagnosis not present

## 2022-11-13 DIAGNOSIS — M62511 Muscle wasting and atrophy, not elsewhere classified, right shoulder: Secondary | ICD-10-CM | POA: Diagnosis not present

## 2022-11-13 DIAGNOSIS — K219 Gastro-esophageal reflux disease without esophagitis: Secondary | ICD-10-CM | POA: Diagnosis not present

## 2022-11-13 DIAGNOSIS — R2681 Unsteadiness on feet: Secondary | ICD-10-CM | POA: Diagnosis not present

## 2022-11-13 DIAGNOSIS — R2689 Other abnormalities of gait and mobility: Secondary | ICD-10-CM | POA: Diagnosis not present

## 2022-11-13 DIAGNOSIS — M6259 Muscle wasting and atrophy, not elsewhere classified, multiple sites: Secondary | ICD-10-CM | POA: Diagnosis not present

## 2022-11-13 DIAGNOSIS — N1831 Chronic kidney disease, stage 3a: Secondary | ICD-10-CM | POA: Diagnosis not present

## 2022-11-13 DIAGNOSIS — I129 Hypertensive chronic kidney disease with stage 1 through stage 4 chronic kidney disease, or unspecified chronic kidney disease: Secondary | ICD-10-CM | POA: Diagnosis not present

## 2022-11-13 DIAGNOSIS — M19011 Primary osteoarthritis, right shoulder: Secondary | ICD-10-CM | POA: Diagnosis not present

## 2022-11-13 DIAGNOSIS — R278 Other lack of coordination: Secondary | ICD-10-CM | POA: Diagnosis not present

## 2022-11-13 DIAGNOSIS — N401 Enlarged prostate with lower urinary tract symptoms: Secondary | ICD-10-CM | POA: Diagnosis not present

## 2022-11-13 DIAGNOSIS — Z993 Dependence on wheelchair: Secondary | ICD-10-CM | POA: Diagnosis not present

## 2022-11-14 DIAGNOSIS — R2689 Other abnormalities of gait and mobility: Secondary | ICD-10-CM | POA: Diagnosis not present

## 2022-11-14 DIAGNOSIS — R278 Other lack of coordination: Secondary | ICD-10-CM | POA: Diagnosis not present

## 2022-11-14 DIAGNOSIS — M6259 Muscle wasting and atrophy, not elsewhere classified, multiple sites: Secondary | ICD-10-CM | POA: Diagnosis not present

## 2022-11-14 DIAGNOSIS — R2681 Unsteadiness on feet: Secondary | ICD-10-CM | POA: Diagnosis not present

## 2022-11-14 DIAGNOSIS — M62511 Muscle wasting and atrophy, not elsewhere classified, right shoulder: Secondary | ICD-10-CM | POA: Diagnosis not present

## 2022-11-15 DIAGNOSIS — R2689 Other abnormalities of gait and mobility: Secondary | ICD-10-CM | POA: Diagnosis not present

## 2022-11-15 DIAGNOSIS — R2681 Unsteadiness on feet: Secondary | ICD-10-CM | POA: Diagnosis not present

## 2022-11-15 DIAGNOSIS — M6259 Muscle wasting and atrophy, not elsewhere classified, multiple sites: Secondary | ICD-10-CM | POA: Diagnosis not present

## 2022-11-15 DIAGNOSIS — R278 Other lack of coordination: Secondary | ICD-10-CM | POA: Diagnosis not present

## 2022-11-15 DIAGNOSIS — M62511 Muscle wasting and atrophy, not elsewhere classified, right shoulder: Secondary | ICD-10-CM | POA: Diagnosis not present

## 2022-11-18 DIAGNOSIS — R278 Other lack of coordination: Secondary | ICD-10-CM | POA: Diagnosis not present

## 2022-11-18 DIAGNOSIS — R2689 Other abnormalities of gait and mobility: Secondary | ICD-10-CM | POA: Diagnosis not present

## 2022-11-18 DIAGNOSIS — M6259 Muscle wasting and atrophy, not elsewhere classified, multiple sites: Secondary | ICD-10-CM | POA: Diagnosis not present

## 2022-11-18 DIAGNOSIS — M62511 Muscle wasting and atrophy, not elsewhere classified, right shoulder: Secondary | ICD-10-CM | POA: Diagnosis not present

## 2022-11-18 DIAGNOSIS — R2681 Unsteadiness on feet: Secondary | ICD-10-CM | POA: Diagnosis not present

## 2022-11-19 DIAGNOSIS — K219 Gastro-esophageal reflux disease without esophagitis: Secondary | ICD-10-CM | POA: Diagnosis not present

## 2022-11-19 DIAGNOSIS — I129 Hypertensive chronic kidney disease with stage 1 through stage 4 chronic kidney disease, or unspecified chronic kidney disease: Secondary | ICD-10-CM | POA: Diagnosis not present

## 2022-11-19 DIAGNOSIS — M62511 Muscle wasting and atrophy, not elsewhere classified, right shoulder: Secondary | ICD-10-CM | POA: Diagnosis not present

## 2022-11-19 DIAGNOSIS — N401 Enlarged prostate with lower urinary tract symptoms: Secondary | ICD-10-CM | POA: Diagnosis not present

## 2022-11-19 DIAGNOSIS — R278 Other lack of coordination: Secondary | ICD-10-CM | POA: Diagnosis not present

## 2022-11-19 DIAGNOSIS — M6259 Muscle wasting and atrophy, not elsewhere classified, multiple sites: Secondary | ICD-10-CM | POA: Diagnosis not present

## 2022-11-19 DIAGNOSIS — R2689 Other abnormalities of gait and mobility: Secondary | ICD-10-CM | POA: Diagnosis not present

## 2022-11-19 DIAGNOSIS — N1831 Chronic kidney disease, stage 3a: Secondary | ICD-10-CM | POA: Diagnosis not present

## 2022-11-19 DIAGNOSIS — R2681 Unsteadiness on feet: Secondary | ICD-10-CM | POA: Diagnosis not present

## 2022-11-20 DIAGNOSIS — R2681 Unsteadiness on feet: Secondary | ICD-10-CM | POA: Diagnosis not present

## 2022-11-20 DIAGNOSIS — R2689 Other abnormalities of gait and mobility: Secondary | ICD-10-CM | POA: Diagnosis not present

## 2022-11-20 DIAGNOSIS — R278 Other lack of coordination: Secondary | ICD-10-CM | POA: Diagnosis not present

## 2022-11-20 DIAGNOSIS — M62511 Muscle wasting and atrophy, not elsewhere classified, right shoulder: Secondary | ICD-10-CM | POA: Diagnosis not present

## 2022-11-20 DIAGNOSIS — M6259 Muscle wasting and atrophy, not elsewhere classified, multiple sites: Secondary | ICD-10-CM | POA: Diagnosis not present

## 2022-11-21 DIAGNOSIS — R2689 Other abnormalities of gait and mobility: Secondary | ICD-10-CM | POA: Diagnosis not present

## 2022-11-21 DIAGNOSIS — R2681 Unsteadiness on feet: Secondary | ICD-10-CM | POA: Diagnosis not present

## 2022-11-21 DIAGNOSIS — M6259 Muscle wasting and atrophy, not elsewhere classified, multiple sites: Secondary | ICD-10-CM | POA: Diagnosis not present

## 2022-11-21 DIAGNOSIS — R278 Other lack of coordination: Secondary | ICD-10-CM | POA: Diagnosis not present

## 2022-11-21 DIAGNOSIS — M62511 Muscle wasting and atrophy, not elsewhere classified, right shoulder: Secondary | ICD-10-CM | POA: Diagnosis not present

## 2022-11-22 DIAGNOSIS — R278 Other lack of coordination: Secondary | ICD-10-CM | POA: Diagnosis not present

## 2022-11-22 DIAGNOSIS — M6259 Muscle wasting and atrophy, not elsewhere classified, multiple sites: Secondary | ICD-10-CM | POA: Diagnosis not present

## 2022-11-22 DIAGNOSIS — R2681 Unsteadiness on feet: Secondary | ICD-10-CM | POA: Diagnosis not present

## 2022-11-22 DIAGNOSIS — M62511 Muscle wasting and atrophy, not elsewhere classified, right shoulder: Secondary | ICD-10-CM | POA: Diagnosis not present

## 2022-11-22 DIAGNOSIS — R2689 Other abnormalities of gait and mobility: Secondary | ICD-10-CM | POA: Diagnosis not present

## 2022-11-25 DIAGNOSIS — R2681 Unsteadiness on feet: Secondary | ICD-10-CM | POA: Diagnosis not present

## 2022-11-25 DIAGNOSIS — M62511 Muscle wasting and atrophy, not elsewhere classified, right shoulder: Secondary | ICD-10-CM | POA: Diagnosis not present

## 2022-11-25 DIAGNOSIS — R278 Other lack of coordination: Secondary | ICD-10-CM | POA: Diagnosis not present

## 2022-11-25 DIAGNOSIS — R2689 Other abnormalities of gait and mobility: Secondary | ICD-10-CM | POA: Diagnosis not present

## 2022-11-25 DIAGNOSIS — M6259 Muscle wasting and atrophy, not elsewhere classified, multiple sites: Secondary | ICD-10-CM | POA: Diagnosis not present

## 2022-11-26 DIAGNOSIS — M62511 Muscle wasting and atrophy, not elsewhere classified, right shoulder: Secondary | ICD-10-CM | POA: Diagnosis not present

## 2022-11-26 DIAGNOSIS — M6259 Muscle wasting and atrophy, not elsewhere classified, multiple sites: Secondary | ICD-10-CM | POA: Diagnosis not present

## 2022-11-26 DIAGNOSIS — R2681 Unsteadiness on feet: Secondary | ICD-10-CM | POA: Diagnosis not present

## 2022-11-26 DIAGNOSIS — R278 Other lack of coordination: Secondary | ICD-10-CM | POA: Diagnosis not present

## 2022-11-26 DIAGNOSIS — R2689 Other abnormalities of gait and mobility: Secondary | ICD-10-CM | POA: Diagnosis not present

## 2022-11-27 DIAGNOSIS — R2689 Other abnormalities of gait and mobility: Secondary | ICD-10-CM | POA: Diagnosis not present

## 2022-11-27 DIAGNOSIS — M62511 Muscle wasting and atrophy, not elsewhere classified, right shoulder: Secondary | ICD-10-CM | POA: Diagnosis not present

## 2022-11-27 DIAGNOSIS — R2681 Unsteadiness on feet: Secondary | ICD-10-CM | POA: Diagnosis not present

## 2022-11-27 DIAGNOSIS — M6259 Muscle wasting and atrophy, not elsewhere classified, multiple sites: Secondary | ICD-10-CM | POA: Diagnosis not present

## 2022-11-27 DIAGNOSIS — R278 Other lack of coordination: Secondary | ICD-10-CM | POA: Diagnosis not present

## 2022-11-28 DIAGNOSIS — R300 Dysuria: Secondary | ICD-10-CM | POA: Diagnosis not present

## 2022-11-28 DIAGNOSIS — R2689 Other abnormalities of gait and mobility: Secondary | ICD-10-CM | POA: Diagnosis not present

## 2022-11-28 DIAGNOSIS — M6259 Muscle wasting and atrophy, not elsewhere classified, multiple sites: Secondary | ICD-10-CM | POA: Diagnosis not present

## 2022-11-28 DIAGNOSIS — R278 Other lack of coordination: Secondary | ICD-10-CM | POA: Diagnosis not present

## 2022-11-28 DIAGNOSIS — R2681 Unsteadiness on feet: Secondary | ICD-10-CM | POA: Diagnosis not present

## 2022-11-28 DIAGNOSIS — Z23 Encounter for immunization: Secondary | ICD-10-CM | POA: Diagnosis not present

## 2022-11-28 DIAGNOSIS — M62511 Muscle wasting and atrophy, not elsewhere classified, right shoulder: Secondary | ICD-10-CM | POA: Diagnosis not present

## 2022-11-28 DIAGNOSIS — N401 Enlarged prostate with lower urinary tract symptoms: Secondary | ICD-10-CM | POA: Diagnosis not present

## 2022-11-29 DIAGNOSIS — R2689 Other abnormalities of gait and mobility: Secondary | ICD-10-CM | POA: Diagnosis not present

## 2022-11-29 DIAGNOSIS — R278 Other lack of coordination: Secondary | ICD-10-CM | POA: Diagnosis not present

## 2022-11-29 DIAGNOSIS — R2681 Unsteadiness on feet: Secondary | ICD-10-CM | POA: Diagnosis not present

## 2022-11-29 DIAGNOSIS — M6259 Muscle wasting and atrophy, not elsewhere classified, multiple sites: Secondary | ICD-10-CM | POA: Diagnosis not present

## 2022-11-29 DIAGNOSIS — M62511 Muscle wasting and atrophy, not elsewhere classified, right shoulder: Secondary | ICD-10-CM | POA: Diagnosis not present

## 2022-12-02 DIAGNOSIS — M6259 Muscle wasting and atrophy, not elsewhere classified, multiple sites: Secondary | ICD-10-CM | POA: Diagnosis not present

## 2022-12-02 DIAGNOSIS — R2689 Other abnormalities of gait and mobility: Secondary | ICD-10-CM | POA: Diagnosis not present

## 2022-12-02 DIAGNOSIS — I2510 Atherosclerotic heart disease of native coronary artery without angina pectoris: Secondary | ICD-10-CM | POA: Diagnosis not present

## 2022-12-02 DIAGNOSIS — M62511 Muscle wasting and atrophy, not elsewhere classified, right shoulder: Secondary | ICD-10-CM | POA: Diagnosis not present

## 2022-12-02 DIAGNOSIS — I10 Essential (primary) hypertension: Secondary | ICD-10-CM | POA: Diagnosis not present

## 2022-12-02 DIAGNOSIS — R2681 Unsteadiness on feet: Secondary | ICD-10-CM | POA: Diagnosis not present

## 2022-12-02 DIAGNOSIS — R278 Other lack of coordination: Secondary | ICD-10-CM | POA: Diagnosis not present

## 2022-12-03 DIAGNOSIS — M62511 Muscle wasting and atrophy, not elsewhere classified, right shoulder: Secondary | ICD-10-CM | POA: Diagnosis not present

## 2022-12-03 DIAGNOSIS — R278 Other lack of coordination: Secondary | ICD-10-CM | POA: Diagnosis not present

## 2022-12-03 DIAGNOSIS — R2681 Unsteadiness on feet: Secondary | ICD-10-CM | POA: Diagnosis not present

## 2022-12-03 DIAGNOSIS — R2689 Other abnormalities of gait and mobility: Secondary | ICD-10-CM | POA: Diagnosis not present

## 2022-12-03 DIAGNOSIS — M6259 Muscle wasting and atrophy, not elsewhere classified, multiple sites: Secondary | ICD-10-CM | POA: Diagnosis not present

## 2022-12-04 DIAGNOSIS — R2689 Other abnormalities of gait and mobility: Secondary | ICD-10-CM | POA: Diagnosis not present

## 2022-12-04 DIAGNOSIS — R278 Other lack of coordination: Secondary | ICD-10-CM | POA: Diagnosis not present

## 2022-12-04 DIAGNOSIS — M6259 Muscle wasting and atrophy, not elsewhere classified, multiple sites: Secondary | ICD-10-CM | POA: Diagnosis not present

## 2022-12-04 DIAGNOSIS — M62511 Muscle wasting and atrophy, not elsewhere classified, right shoulder: Secondary | ICD-10-CM | POA: Diagnosis not present

## 2022-12-04 DIAGNOSIS — R2681 Unsteadiness on feet: Secondary | ICD-10-CM | POA: Diagnosis not present

## 2022-12-05 DIAGNOSIS — M62511 Muscle wasting and atrophy, not elsewhere classified, right shoulder: Secondary | ICD-10-CM | POA: Diagnosis not present

## 2022-12-05 DIAGNOSIS — R2689 Other abnormalities of gait and mobility: Secondary | ICD-10-CM | POA: Diagnosis not present

## 2022-12-05 DIAGNOSIS — R278 Other lack of coordination: Secondary | ICD-10-CM | POA: Diagnosis not present

## 2022-12-05 DIAGNOSIS — M6259 Muscle wasting and atrophy, not elsewhere classified, multiple sites: Secondary | ICD-10-CM | POA: Diagnosis not present

## 2022-12-05 DIAGNOSIS — R2681 Unsteadiness on feet: Secondary | ICD-10-CM | POA: Diagnosis not present

## 2022-12-06 DIAGNOSIS — R2689 Other abnormalities of gait and mobility: Secondary | ICD-10-CM | POA: Diagnosis not present

## 2022-12-06 DIAGNOSIS — R278 Other lack of coordination: Secondary | ICD-10-CM | POA: Diagnosis not present

## 2022-12-06 DIAGNOSIS — M62511 Muscle wasting and atrophy, not elsewhere classified, right shoulder: Secondary | ICD-10-CM | POA: Diagnosis not present

## 2022-12-06 DIAGNOSIS — M6259 Muscle wasting and atrophy, not elsewhere classified, multiple sites: Secondary | ICD-10-CM | POA: Diagnosis not present

## 2022-12-06 DIAGNOSIS — R2681 Unsteadiness on feet: Secondary | ICD-10-CM | POA: Diagnosis not present

## 2022-12-09 DIAGNOSIS — R2681 Unsteadiness on feet: Secondary | ICD-10-CM | POA: Diagnosis not present

## 2022-12-09 DIAGNOSIS — R278 Other lack of coordination: Secondary | ICD-10-CM | POA: Diagnosis not present

## 2022-12-09 DIAGNOSIS — M6259 Muscle wasting and atrophy, not elsewhere classified, multiple sites: Secondary | ICD-10-CM | POA: Diagnosis not present

## 2022-12-09 DIAGNOSIS — R2689 Other abnormalities of gait and mobility: Secondary | ICD-10-CM | POA: Diagnosis not present

## 2022-12-09 DIAGNOSIS — M62511 Muscle wasting and atrophy, not elsewhere classified, right shoulder: Secondary | ICD-10-CM | POA: Diagnosis not present

## 2022-12-10 DIAGNOSIS — R2681 Unsteadiness on feet: Secondary | ICD-10-CM | POA: Diagnosis not present

## 2022-12-10 DIAGNOSIS — R2689 Other abnormalities of gait and mobility: Secondary | ICD-10-CM | POA: Diagnosis not present

## 2022-12-10 DIAGNOSIS — M6259 Muscle wasting and atrophy, not elsewhere classified, multiple sites: Secondary | ICD-10-CM | POA: Diagnosis not present

## 2022-12-10 DIAGNOSIS — M62511 Muscle wasting and atrophy, not elsewhere classified, right shoulder: Secondary | ICD-10-CM | POA: Diagnosis not present

## 2022-12-10 DIAGNOSIS — R278 Other lack of coordination: Secondary | ICD-10-CM | POA: Diagnosis not present

## 2022-12-11 DIAGNOSIS — M19011 Primary osteoarthritis, right shoulder: Secondary | ICD-10-CM | POA: Diagnosis not present

## 2022-12-12 DIAGNOSIS — R2689 Other abnormalities of gait and mobility: Secondary | ICD-10-CM | POA: Diagnosis not present

## 2022-12-12 DIAGNOSIS — M62511 Muscle wasting and atrophy, not elsewhere classified, right shoulder: Secondary | ICD-10-CM | POA: Diagnosis not present

## 2022-12-12 DIAGNOSIS — R278 Other lack of coordination: Secondary | ICD-10-CM | POA: Diagnosis not present

## 2022-12-12 DIAGNOSIS — R2681 Unsteadiness on feet: Secondary | ICD-10-CM | POA: Diagnosis not present

## 2022-12-12 DIAGNOSIS — M6259 Muscle wasting and atrophy, not elsewhere classified, multiple sites: Secondary | ICD-10-CM | POA: Diagnosis not present

## 2022-12-13 DIAGNOSIS — R278 Other lack of coordination: Secondary | ICD-10-CM | POA: Diagnosis not present

## 2022-12-13 DIAGNOSIS — M6259 Muscle wasting and atrophy, not elsewhere classified, multiple sites: Secondary | ICD-10-CM | POA: Diagnosis not present

## 2022-12-13 DIAGNOSIS — R2681 Unsteadiness on feet: Secondary | ICD-10-CM | POA: Diagnosis not present

## 2022-12-13 DIAGNOSIS — M62511 Muscle wasting and atrophy, not elsewhere classified, right shoulder: Secondary | ICD-10-CM | POA: Diagnosis not present

## 2022-12-13 DIAGNOSIS — R2689 Other abnormalities of gait and mobility: Secondary | ICD-10-CM | POA: Diagnosis not present

## 2022-12-16 DIAGNOSIS — M62511 Muscle wasting and atrophy, not elsewhere classified, right shoulder: Secondary | ICD-10-CM | POA: Diagnosis not present

## 2022-12-16 DIAGNOSIS — R2689 Other abnormalities of gait and mobility: Secondary | ICD-10-CM | POA: Diagnosis not present

## 2022-12-16 DIAGNOSIS — R2681 Unsteadiness on feet: Secondary | ICD-10-CM | POA: Diagnosis not present

## 2022-12-16 DIAGNOSIS — M6259 Muscle wasting and atrophy, not elsewhere classified, multiple sites: Secondary | ICD-10-CM | POA: Diagnosis not present

## 2022-12-16 DIAGNOSIS — R278 Other lack of coordination: Secondary | ICD-10-CM | POA: Diagnosis not present

## 2022-12-17 DIAGNOSIS — M6259 Muscle wasting and atrophy, not elsewhere classified, multiple sites: Secondary | ICD-10-CM | POA: Diagnosis not present

## 2022-12-17 DIAGNOSIS — I129 Hypertensive chronic kidney disease with stage 1 through stage 4 chronic kidney disease, or unspecified chronic kidney disease: Secondary | ICD-10-CM | POA: Diagnosis not present

## 2022-12-17 DIAGNOSIS — R278 Other lack of coordination: Secondary | ICD-10-CM | POA: Diagnosis not present

## 2022-12-17 DIAGNOSIS — N1831 Chronic kidney disease, stage 3a: Secondary | ICD-10-CM | POA: Diagnosis not present

## 2022-12-17 DIAGNOSIS — R2689 Other abnormalities of gait and mobility: Secondary | ICD-10-CM | POA: Diagnosis not present

## 2022-12-17 DIAGNOSIS — M62511 Muscle wasting and atrophy, not elsewhere classified, right shoulder: Secondary | ICD-10-CM | POA: Diagnosis not present

## 2022-12-17 DIAGNOSIS — R2681 Unsteadiness on feet: Secondary | ICD-10-CM | POA: Diagnosis not present

## 2022-12-18 DIAGNOSIS — R2681 Unsteadiness on feet: Secondary | ICD-10-CM | POA: Diagnosis not present

## 2022-12-18 DIAGNOSIS — M62511 Muscle wasting and atrophy, not elsewhere classified, right shoulder: Secondary | ICD-10-CM | POA: Diagnosis not present

## 2022-12-18 DIAGNOSIS — R2689 Other abnormalities of gait and mobility: Secondary | ICD-10-CM | POA: Diagnosis not present

## 2022-12-18 DIAGNOSIS — R278 Other lack of coordination: Secondary | ICD-10-CM | POA: Diagnosis not present

## 2022-12-18 DIAGNOSIS — M6259 Muscle wasting and atrophy, not elsewhere classified, multiple sites: Secondary | ICD-10-CM | POA: Diagnosis not present

## 2022-12-19 DIAGNOSIS — M6259 Muscle wasting and atrophy, not elsewhere classified, multiple sites: Secondary | ICD-10-CM | POA: Diagnosis not present

## 2022-12-19 DIAGNOSIS — R2681 Unsteadiness on feet: Secondary | ICD-10-CM | POA: Diagnosis not present

## 2022-12-19 DIAGNOSIS — R2689 Other abnormalities of gait and mobility: Secondary | ICD-10-CM | POA: Diagnosis not present

## 2022-12-19 DIAGNOSIS — M62511 Muscle wasting and atrophy, not elsewhere classified, right shoulder: Secondary | ICD-10-CM | POA: Diagnosis not present

## 2022-12-19 DIAGNOSIS — R278 Other lack of coordination: Secondary | ICD-10-CM | POA: Diagnosis not present

## 2022-12-20 DIAGNOSIS — R278 Other lack of coordination: Secondary | ICD-10-CM | POA: Diagnosis not present

## 2022-12-20 DIAGNOSIS — M62511 Muscle wasting and atrophy, not elsewhere classified, right shoulder: Secondary | ICD-10-CM | POA: Diagnosis not present

## 2022-12-20 DIAGNOSIS — R2681 Unsteadiness on feet: Secondary | ICD-10-CM | POA: Diagnosis not present

## 2022-12-20 DIAGNOSIS — M6259 Muscle wasting and atrophy, not elsewhere classified, multiple sites: Secondary | ICD-10-CM | POA: Diagnosis not present

## 2022-12-20 DIAGNOSIS — R2689 Other abnormalities of gait and mobility: Secondary | ICD-10-CM | POA: Diagnosis not present

## 2022-12-23 ENCOUNTER — Telehealth: Payer: Self-pay | Admitting: Cardiovascular Disease

## 2022-12-23 ENCOUNTER — Telehealth: Payer: Self-pay | Admitting: *Deleted

## 2022-12-23 ENCOUNTER — Encounter: Payer: Self-pay | Admitting: Cardiovascular Disease

## 2022-12-23 DIAGNOSIS — M62511 Muscle wasting and atrophy, not elsewhere classified, right shoulder: Secondary | ICD-10-CM | POA: Diagnosis not present

## 2022-12-23 DIAGNOSIS — R2681 Unsteadiness on feet: Secondary | ICD-10-CM | POA: Diagnosis not present

## 2022-12-23 DIAGNOSIS — M6259 Muscle wasting and atrophy, not elsewhere classified, multiple sites: Secondary | ICD-10-CM | POA: Diagnosis not present

## 2022-12-23 DIAGNOSIS — R2689 Other abnormalities of gait and mobility: Secondary | ICD-10-CM | POA: Diagnosis not present

## 2022-12-23 DIAGNOSIS — R278 Other lack of coordination: Secondary | ICD-10-CM | POA: Diagnosis not present

## 2022-12-23 NOTE — Telephone Encounter (Signed)
   Pre-operative Risk Assessment    Patient Name: Kevin Wiley  DOB: 21-Dec-1940 MRN: [id number]      Request for Surgical Clearance    Procedure:   Right Reverse Total Shoulder Replacement  Date of Surgery:  Clearance 01/09/23                                 Surgeon:  Dr Claretha Cooper Group or Practice Name:  [facility] Phone number:  [phone]  Fax number:  [phone]    Type of Clearance Requested:   Both Unsure of medication   Type of Anesthesia:   Choice   Additional requests/questions:    Nilda Riggs   12/23/2022, 10:40 AM

## 2022-12-23 NOTE — Telephone Encounter (Signed)
Pt has been scheduled tele pre op appt 01/01/23. Med rec and consent are done.     Patient Consent for Virtual Visit        Kevin Wiley has provided verbal consent on 12/23/2022 for a virtual visit (video or telephone).   CONSENT FOR VIRTUAL VISIT FOR:  Kevin Wiley  By participating in this virtual visit I agree to the following:  I hereby voluntarily request, consent and authorize [facility] and its employed or contracted physicians, physician assistants, nurse practitioners or other licensed health care professionals (the Practitioner), to provide me with telemedicine health care services (the "Services") as deemed necessary by the treating Practitioner. I acknowledge and consent to receive the Services by the Practitioner via telemedicine. I understand that the telemedicine visit will involve communicating with the Practitioner through live audiovisual communication technology and the disclosure of certain medical information by electronic transmission. I acknowledge that I have been given the opportunity to request an in-person assessment or other available alternative prior to the telemedicine visit and am voluntarily participating in the telemedicine visit.  I understand that I have the right to withhold or withdraw my consent to the use of telemedicine in the course of my care at any time, without affecting my right to future care or treatment, and that the Practitioner or I may terminate the telemedicine visit at any time. I understand that I have the right to inspect all information obtained and/or recorded in the course of the telemedicine visit and may receive copies of available information for a reasonable fee.  I understand that some of the potential risks of receiving the Services via telemedicine include:  Delay or interruption in medical evaluation due to technological equipment failure or disruption; Information transmitted may not be sufficient (e.g. poor  resolution of images) to allow for appropriate medical decision making by the Practitioner; and/or  In rare instances, security protocols could fail, causing a breach of personal health information.  Furthermore, I acknowledge that it is my responsibility to provide information about my medical history, conditions and care that is complete and accurate to the best of my ability. I acknowledge that Practitioner's advice, recommendations, and/or decision may be based on factors not within their control, such as incomplete or inaccurate data provided by me or distortions of diagnostic images or specimens that may result from electronic transmissions. I understand that the practice of medicine is not an exact science and that Practitioner makes no warranties or guarantees regarding treatment outcomes. I acknowledge that a copy of this consent can be made available to me via my patient portal [facility] MyChart), or I can request a printed copy by calling the office of [facility].    I understand that my insurance will be billed for this visit.   I have read or had this consent read to me. I understand the contents of this consent, which adequately explains the benefits and risks of the Services being provided via telemedicine.  I have been provided ample opportunity to ask questions regarding this consent and the Services and have had my questions answered to my satisfaction. I give my informed consent for the services to be provided through the use of telemedicine in my medical care

## 2022-12-23 NOTE — Telephone Encounter (Signed)
See separate clearance for PPM device.

## 2022-12-23 NOTE — Progress Notes (Signed)
PERIOPERATIVE PRESCRIPTION FOR IMPLANTED CARDIAC DEVICE PROGRAMMING  Patient Information: Name:  Kevin Wiley  DOB:  July 14, 1940  MRN:  [id number]    Procedure:   Right Reverse Total Shoulder Replacement   Date of Surgery:  Clearance 01/09/23                                 Surgeon:  Dr Claretha Cooper Group or Practice Name:  [facility] Phone number:  [phone]  Fax number:  [phone]  Device Information:  Clinic EP Physician:  Thurmon Fair, MD  Device Type:  Pacemaker Manufacturer and Phone #:  Medtronic: [id number] Pacemaker Dependent?:  No.but due to location, Dr. Royann Shivers recommends asynch pacing if cautery used.   Date of Last Device Check:  In office: Dr. Royann Shivers  09/05/2022 (he does not do remotes) Normal Device Function?:  Yes.    Electrophysiologist's Recommendations:  Have magnet available. Provide continuous ECG monitoring when magnet is used or reprogramming is to be performed.  Procedure will likely interfere with device function.  Device should be programmed:  Asynchronous pacing during procedure and returned to normal programming after procedure  Per Device Clinic Standing Orders, Kizzie Ide, RN  1:25 PM 12/23/2022

## 2022-12-23 NOTE — Telephone Encounter (Signed)
   Name: Kevin Wiley  DOB: 1940/07/21  MRN: [id number]  Primary Cardiologist: Thurmon Fair, MD   Preoperative team, please contact this patient and set up a phone call appointment for further preoperative risk assessment. Please obtain consent and complete medication review. Thank you for your help.  I confirm that guidance regarding antiplatelet and oral anticoagulation therapy has been completed and, if necessary, noted below.  Medtronic PPM routed to heart care device for guidance on upcoming reverse total shoulder replacement.  None  I also confirmed the patient resides in the state of [place]. As per [facility] Medical Board telemedicine laws, the patient must reside in the state in which the provider is licensed.   Napoleon Form, Leodis Rains, NP 12/23/2022, 10:51 AM [facility]

## 2022-12-23 NOTE — Telephone Encounter (Signed)
 Pt has been scheduled tele pre op appt 01/01/23. Med rec and consent are done.

## 2022-12-24 DIAGNOSIS — N1831 Chronic kidney disease, stage 3a: Secondary | ICD-10-CM | POA: Diagnosis not present

## 2022-12-24 DIAGNOSIS — K219 Gastro-esophageal reflux disease without esophagitis: Secondary | ICD-10-CM | POA: Diagnosis not present

## 2022-12-24 DIAGNOSIS — R278 Other lack of coordination: Secondary | ICD-10-CM | POA: Diagnosis not present

## 2022-12-24 DIAGNOSIS — M6259 Muscle wasting and atrophy, not elsewhere classified, multiple sites: Secondary | ICD-10-CM | POA: Diagnosis not present

## 2022-12-24 DIAGNOSIS — R2681 Unsteadiness on feet: Secondary | ICD-10-CM | POA: Diagnosis not present

## 2022-12-24 DIAGNOSIS — I129 Hypertensive chronic kidney disease with stage 1 through stage 4 chronic kidney disease, or unspecified chronic kidney disease: Secondary | ICD-10-CM | POA: Diagnosis not present

## 2022-12-24 DIAGNOSIS — R2689 Other abnormalities of gait and mobility: Secondary | ICD-10-CM | POA: Diagnosis not present

## 2022-12-24 DIAGNOSIS — M62511 Muscle wasting and atrophy, not elsewhere classified, right shoulder: Secondary | ICD-10-CM | POA: Diagnosis not present

## 2022-12-24 DIAGNOSIS — M19111 Post-traumatic osteoarthritis, right shoulder: Secondary | ICD-10-CM | POA: Diagnosis not present

## 2022-12-25 ENCOUNTER — Telehealth: Payer: Self-pay | Admitting: Cardiovascular Disease

## 2022-12-25 DIAGNOSIS — I2510 Atherosclerotic heart disease of native coronary artery without angina pectoris: Secondary | ICD-10-CM | POA: Diagnosis not present

## 2022-12-25 DIAGNOSIS — M6259 Muscle wasting and atrophy, not elsewhere classified, multiple sites: Secondary | ICD-10-CM | POA: Diagnosis not present

## 2022-12-25 DIAGNOSIS — R2689 Other abnormalities of gait and mobility: Secondary | ICD-10-CM | POA: Diagnosis not present

## 2022-12-25 DIAGNOSIS — M62511 Muscle wasting and atrophy, not elsewhere classified, right shoulder: Secondary | ICD-10-CM | POA: Diagnosis not present

## 2022-12-25 DIAGNOSIS — R2681 Unsteadiness on feet: Secondary | ICD-10-CM | POA: Diagnosis not present

## 2022-12-25 DIAGNOSIS — I10 Essential (primary) hypertension: Secondary | ICD-10-CM | POA: Diagnosis not present

## 2022-12-25 DIAGNOSIS — R278 Other lack of coordination: Secondary | ICD-10-CM | POA: Diagnosis not present

## 2022-12-25 NOTE — Telephone Encounter (Signed)
Ann Held N2 hours ago (9:36 AM)   [facility] Patient's daughter in law called with the patient's son requesting he be called on 11/27 for the patient's telehealth appointment. Patient's son confirmed he will be with the patient at the time of call. The contact number is the number listed in the contact info of this note. [phone].      Note   Slider,David [phone]  [facility]

## 2022-12-25 NOTE — Telephone Encounter (Signed)
Patient's daughter in law called with the patient's son requesting he be called on 11/27 for the patient's telehealth appointment. Patient's son confirmed he will be with the patient at the time of call. The contact number is the number listed in the contact info of this note. [phone].

## 2022-12-26 DIAGNOSIS — M6259 Muscle wasting and atrophy, not elsewhere classified, multiple sites: Secondary | ICD-10-CM | POA: Diagnosis not present

## 2022-12-26 DIAGNOSIS — R2689 Other abnormalities of gait and mobility: Secondary | ICD-10-CM | POA: Diagnosis not present

## 2022-12-26 DIAGNOSIS — M62511 Muscle wasting and atrophy, not elsewhere classified, right shoulder: Secondary | ICD-10-CM | POA: Diagnosis not present

## 2022-12-26 DIAGNOSIS — R278 Other lack of coordination: Secondary | ICD-10-CM | POA: Diagnosis not present

## 2022-12-26 DIAGNOSIS — R2681 Unsteadiness on feet: Secondary | ICD-10-CM | POA: Diagnosis not present

## 2022-12-27 DIAGNOSIS — M6259 Muscle wasting and atrophy, not elsewhere classified, multiple sites: Secondary | ICD-10-CM | POA: Diagnosis not present

## 2022-12-27 DIAGNOSIS — M62511 Muscle wasting and atrophy, not elsewhere classified, right shoulder: Secondary | ICD-10-CM | POA: Diagnosis not present

## 2022-12-27 DIAGNOSIS — R2689 Other abnormalities of gait and mobility: Secondary | ICD-10-CM | POA: Diagnosis not present

## 2022-12-27 DIAGNOSIS — R278 Other lack of coordination: Secondary | ICD-10-CM | POA: Diagnosis not present

## 2022-12-27 DIAGNOSIS — R2681 Unsteadiness on feet: Secondary | ICD-10-CM | POA: Diagnosis not present

## 2022-12-30 DIAGNOSIS — R2681 Unsteadiness on feet: Secondary | ICD-10-CM | POA: Diagnosis not present

## 2022-12-30 DIAGNOSIS — M6259 Muscle wasting and atrophy, not elsewhere classified, multiple sites: Secondary | ICD-10-CM | POA: Diagnosis not present

## 2022-12-30 DIAGNOSIS — M62511 Muscle wasting and atrophy, not elsewhere classified, right shoulder: Secondary | ICD-10-CM | POA: Diagnosis not present

## 2022-12-30 DIAGNOSIS — R278 Other lack of coordination: Secondary | ICD-10-CM | POA: Diagnosis not present

## 2022-12-30 DIAGNOSIS — R2689 Other abnormalities of gait and mobility: Secondary | ICD-10-CM | POA: Diagnosis not present

## 2022-12-31 DIAGNOSIS — R278 Other lack of coordination: Secondary | ICD-10-CM | POA: Diagnosis not present

## 2022-12-31 DIAGNOSIS — M62511 Muscle wasting and atrophy, not elsewhere classified, right shoulder: Secondary | ICD-10-CM | POA: Diagnosis not present

## 2022-12-31 DIAGNOSIS — R2689 Other abnormalities of gait and mobility: Secondary | ICD-10-CM | POA: Diagnosis not present

## 2022-12-31 DIAGNOSIS — R2681 Unsteadiness on feet: Secondary | ICD-10-CM | POA: Diagnosis not present

## 2022-12-31 DIAGNOSIS — M6259 Muscle wasting and atrophy, not elsewhere classified, multiple sites: Secondary | ICD-10-CM | POA: Diagnosis not present

## 2022-12-31 NOTE — Care Plan (Signed)
Ortho Bundle Case Management Note  Patient Details  Name: Kevin Wiley MRN: [id number] Date of Birth: 1940-10-02 Patient resides at [facility]. RN contact is Lurena Joiner [phone]. He will return to this level of care and receive all therapy at the facility. Bunnie Domino, is POA  [phone].                    DME Arranged:    DME Agency:     HH Arranged:    HH Agency:     Additional Comments: Please contact me with any questions of if this plan should need to change.  Shauna Hugh,  RN,BSN,MHA,CCM  [facility] Orthopaedic Specialist  [phone] 12/31/2022, 3:33 PM

## 2023-01-01 ENCOUNTER — Other Ambulatory Visit: Payer: Self-pay | Admitting: Orthopedic Surgery

## 2023-01-01 ENCOUNTER — Ambulatory Visit: Payer: Medicare Other | Attending: Cardiology

## 2023-01-01 DIAGNOSIS — M62511 Muscle wasting and atrophy, not elsewhere classified, right shoulder: Secondary | ICD-10-CM | POA: Diagnosis not present

## 2023-01-01 DIAGNOSIS — R278 Other lack of coordination: Secondary | ICD-10-CM | POA: Diagnosis not present

## 2023-01-01 DIAGNOSIS — R2681 Unsteadiness on feet: Secondary | ICD-10-CM | POA: Diagnosis not present

## 2023-01-01 DIAGNOSIS — M6259 Muscle wasting and atrophy, not elsewhere classified, multiple sites: Secondary | ICD-10-CM | POA: Diagnosis not present

## 2023-01-01 DIAGNOSIS — R2689 Other abnormalities of gait and mobility: Secondary | ICD-10-CM | POA: Diagnosis not present

## 2023-01-01 DIAGNOSIS — Z01810 Encounter for preprocedural cardiovascular examination: Secondary | ICD-10-CM

## 2023-01-01 NOTE — Addendum Note (Signed)
Addended by: Ronney Asters on: 01/01/2023 11:01 AM   Modules accepted: Level of Service

## 2023-01-01 NOTE — Progress Notes (Signed)
Attempted to contact patient via provided phone number x 4.  Patient's son answered call and reported that he was not with his father at that time.  He reported his father is incoherent.  I was unable to complete phone evaluation.  Instructed son to call scheduling office to set up appointment for time when he would be with his father.  Son hung up phone before call was able to be completed.  Will defer preoperative cardiac evaluation to different time and date.  Given current patient's situation phone evaluation would not be appropriate for this patient.  In office exam and cardiac evaluation would be appropriate.  Thomasene Ripple. Donesha Wallander NP-C     01/01/2023, 10:59 AM [facility] [address] Office [phone] Fax [phone]

## 2023-01-06 ENCOUNTER — Encounter: Payer: Self-pay | Admitting: Cardiovascular Disease

## 2023-01-06 DIAGNOSIS — M62511 Muscle wasting and atrophy, not elsewhere classified, right shoulder: Secondary | ICD-10-CM | POA: Diagnosis not present

## 2023-01-06 DIAGNOSIS — R278 Other lack of coordination: Secondary | ICD-10-CM | POA: Diagnosis not present

## 2023-01-06 DIAGNOSIS — R2689 Other abnormalities of gait and mobility: Secondary | ICD-10-CM | POA: Diagnosis not present

## 2023-01-06 DIAGNOSIS — R2681 Unsteadiness on feet: Secondary | ICD-10-CM | POA: Diagnosis not present

## 2023-01-06 DIAGNOSIS — M6259 Muscle wasting and atrophy, not elsewhere classified, multiple sites: Secondary | ICD-10-CM | POA: Diagnosis not present

## 2023-01-06 NOTE — Patient Instructions (Addendum)
SURGICAL WAITING ROOM VISITATION Patients having surgery or a procedure may have no more than 2 support people in the waiting area - these visitors may rotate.    Children under the age of 44 must have an adult with them who is not the patient.  If the patient needs to stay at the hospital during part of their recovery, the visitor guidelines for inpatient rooms apply. Pre-op nurse will coordinate an appropriate time for 1 support person to accompany patient in pre-op.  This support person may not rotate.    Please refer to the [facility] website for the visitor guidelines for Inpatients (after your surgery is over and you are in a regular room).       Your procedure is scheduled on: 01-09-23   Report to [facility] Main Entrance    Report to admitting at 7:00 AM   Call this number if you have problems the morning of surgery [phone]   Do not eat food :After Midnight.   After Midnight you may have the following liquids until 6:25 AM DAY OF SURGERY  Water Non-Citrus Juices (without pulp, NO RED-Apple, White grape, White cranberry) Black Coffee (NO MILK/CREAM OR CREAMERS, sugar ok)  Clear Tea (NO MILK/CREAM OR CREAMERS, sugar ok) regular and decaf                             Plain Jell-O (NO RED)                                           Fruit ices (not with fruit pulp, NO RED)                                     Popsicles (NO RED)                                                               Sports drinks like Gatorade (NO RED)   The day of surgery:  Drink ONE (1) Pre-Surgery Clear Ensure by 6:25 AM the morning of surgery. Drink in one sitting. Do not sip.  This drink was given to you during your hospital  pre-op appointment visit. Nothing else to drink after completing the Pre-Surgery Clear Ensure.          If you have questions, please contact your surgeon's office.  FOLLOW  ANY ADDITIONAL PRE OP INSTRUCTIONS YOU RECEIVED FROM YOUR SURGEON'S OFFICE!!!     Oral  Hygiene is also important to reduce your risk of infection.                                    Remember - BRUSH YOUR TEETH THE MORNING OF SURGERY WITH YOUR REGULAR TOOTHPASTE   Do NOT smoke after Midnight   Take these medicines the morning of surgery with A SIP OF WATER:   Atenolol  Duloxetine  Finasteride  Omeprazole  Oxybutynin  If needed Tramadol, Tylenol  Okay to use Flonase nasal spray  Stop all vitamins and herbal supplements 7 days before surgery  Bring CPAP mask and tubing day of surgery.                              You may not have any metal on your body including jewelry, and body piercing             Do not wear lotions, powders, cologne, or deodorant              Men may shave face and neck.   Do not bring valuables to the hospital. [facility] IS NOT RESPONSIBLE   FOR VALUABLES.   Contacts, dentures or bridgework may not be worn into surgery.  DO NOT BRING YOUR HOME MEDICATIONS TO THE HOSPITAL. PHARMACY WILL DISPENSE MEDICATIONS LISTED ON YOUR MEDICATION LIST TO YOU DURING YOUR ADMISSION IN THE HOSPITAL!    Patients discharged on the day of surgery will not be allowed to drive home.  Someone NEEDS to stay with you for the first 24 hours after anesthesia.   Special Instructions: Bring a copy of your healthcare power of attorney and living will documents the day of surgery if you haven't scanned them before.              Please read over the following fact sheets you were given: IF YOU HAVE QUESTIONS ABOUT YOUR PRE-OP INSTRUCTIONS PLEASE CALL [phone] Kevin Wiley   If you received a COVID test during your pre-op visit  it is requested that you wear a mask when out in public, stay away from anyone that may not be feeling well and notify your surgeon if you develop symptoms. If you test positive for Covid or have been in contact with anyone that has tested positive in the last 10 days please notify you surgeon.  - Preparing for Total Shoulder Arthroplasty     Before surgery, you can play an important role. Because skin is not sterile, your skin needs to be as free of germs as possible. You can reduce the number of germs on your skin by using the following products. Benzoyl Peroxide Gel Reduces the number of germs present on the skin Applied twice a day to shoulder area starting two days before surgery    ==================================================================  Please follow these instructions carefully:  BENZOYL PEROXIDE 5% GEL  Please do not use if you have an allergy to benzoyl peroxide.   If your skin becomes reddened/irritated stop using the benzoyl peroxide.  Starting two days before surgery, apply as follows: Apply benzoyl peroxide in the morning and at night. Apply after taking a shower. If you are not taking a shower clean entire shoulder front, back, and side along with the armpit with a clean wet washcloth.  Place a quarter-sized dollop on your shoulder and rub in thoroughly, making sure to cover the front, back, and side of your shoulder, along with the armpit.   2 days before ____ AM   ____ PM              1 day before ____ AM   ____ PM                         Do this twice a day for two days.  (Last application is the night before surgery, AFTER using the CHG soap as described below).  Do NOT apply benzoyl peroxide gel on the day  of surgery.    Pre-operative 5 CHG Bath Instructions   You can play a key role in reducing the risk of infection after surgery. Your skin needs to be as free of germs as possible. You can reduce the number of germs on your skin by washing with CHG (chlorhexidine gluconate) soap before surgery. CHG is an antiseptic soap that kills germs and continues to kill germs even after washing.   DO NOT use if you have an allergy to chlorhexidine/CHG or antibacterial soaps. If your skin becomes reddened or irritated, stop using the CHG and notify one of our RNs at [phone].   Please shower with  the CHG soap starting 4 days before surgery using the following schedule:     Please keep in mind the following:  DO NOT shave, including legs and underarms, starting the day of your first shower.   You may shave your face at any point before/day of surgery.  Place clean sheets on your bed the day you start using CHG soap. Use a clean washcloth (not used since being washed) for each shower. DO NOT sleep with pets once you start using the CHG.   CHG Shower Instructions:  If you choose to wash your hair and private area, wash first with your normal shampoo/soap.  After you use shampoo/soap, rinse your hair and body thoroughly to remove shampoo/soap residue.  Turn the water OFF and apply about 3 tablespoons (45 ml) of CHG soap to a CLEAN washcloth.  Apply CHG soap ONLY FROM YOUR NECK DOWN TO YOUR TOES (washing for 3-5 minutes)  DO NOT use CHG soap on face, private areas, open wounds, or sores.  Pay special attention to the area where your surgery is being performed.  If you are having back surgery, having someone wash your back for you may be helpful. Wait 2 minutes after CHG soap is applied, then you may rinse off the CHG soap.  Pat dry with a clean towel  Put on clean clothes/pajamas   If you choose to wear lotion, please use ONLY the CHG-compatible lotions on the back of this paper.     Additional instructions for the day of surgery: DO NOT APPLY any lotions, deodorants, cologne, or perfumes.   Put on clean/comfortable clothes.  Brush your teeth.  Ask your nurse before applying any prescription medications to the skin.      CHG Compatible Lotions   Aveeno Moisturizing lotion  Cetaphil Moisturizing Cream  Cetaphil Moisturizing Lotion  Clairol Herbal Essence Moisturizing Lotion, Dry Skin  Clairol Herbal Essence Moisturizing Lotion, Extra Dry Skin  Clairol Herbal Essence Moisturizing Lotion, Normal Skin  Curel Age Defying Therapeutic Moisturizing Lotion with Alpha Hydroxy   Curel Extreme Care Body Lotion  Curel Soothing Hands Moisturizing Hand Lotion  Curel Therapeutic Moisturizing Cream, Fragrance-Free  Curel Therapeutic Moisturizing Lotion, Fragrance-Free  Curel Therapeutic Moisturizing Lotion, Original Formula  Eucerin Daily Replenishing Lotion  Eucerin Dry Skin Therapy Plus Alpha Hydroxy Crme  Eucerin Dry Skin Therapy Plus Alpha Hydroxy Lotion  Eucerin Original Crme  Eucerin Original Lotion  Eucerin Plus Crme Eucerin Plus Lotion  Eucerin TriLipid Replenishing Lotion  Keri Anti-Bacterial Hand Lotion  Keri Deep Conditioning Original Lotion Dry Skin Formula Softly Scented  Keri Deep Conditioning Original Lotion, Fragrance Free Sensitive Skin Formula  Keri Lotion Fast Absorbing Fragrance Free Sensitive Skin Formula  Keri Lotion Fast Absorbing Softly Scented Dry Skin Formula  Keri Original Lotion  Keri Skin Renewal Lotion Keri Silky Smooth Lotion  [employer]  Smooth Sensitive Skin Lotion  Nivea Body Creamy Conditioning Oil  Nivea Body Extra Enriched Lotion  Nivea Body Original Lotion  Nivea Body Sheer Moisturizing Lotion Nivea Crme  Nivea Skin Firming Lotion  NutraDerm 30 Skin Lotion  NutraDerm Skin Lotion  NutraDerm Therapeutic Skin Cream  NutraDerm Therapeutic Skin Lotion  ProShield Protective Hand Cream  Provon moisturizing lotion   PATIENT SIGNATURE_________________________________  NURSE SIGNATURE__________________________________  ________________________________________________________________________    Kevin Wiley  An incentive spirometer is a tool that can help keep your lungs clear and active. This tool measures how well you are filling your lungs with each breath. Taking long deep breaths may help reverse or decrease the chance of developing breathing (pulmonary) problems (especially infection) following: A long period of time when you are unable to move or be active. BEFORE THE PROCEDURE  If the spirometer includes an  indicator to show your best effort, your nurse or respiratory therapist will set it to a desired goal. If possible, sit up straight or lean slightly forward. Try not to slouch. Hold the incentive spirometer in an upright position. INSTRUCTIONS FOR USE  Sit on the edge of your bed if possible, or sit up as far as you can in bed or on a chair. Hold the incentive spirometer in an upright position. Breathe out normally. Place the mouthpiece in your mouth and seal your lips tightly around it. Breathe in slowly and as deeply as possible, raising the piston or the ball toward the top of the column. Hold your breath for 3-5 seconds or for as long as possible. Allow the piston or ball to fall to the bottom of the column. Remove the mouthpiece from your mouth and breathe out normally. Rest for a few seconds and repeat Steps 1 through 7 at least 10 times every 1-2 hours when you are awake. Take your time and take a few normal breaths between deep breaths. The spirometer may include an indicator to show your best effort. Use the indicator as a goal to work toward during each repetition. After each set of 10 deep breaths, practice coughing to be sure your lungs are clear. If you have an incision (the cut made at the time of surgery), support your incision when coughing by placing a pillow or rolled up towels firmly against it. Once you are able to get out of bed, walk around indoors and cough well. You may stop using the incentive spirometer when instructed by your caregiver.  RISKS AND COMPLICATIONS Take your time so you do not get dizzy or light-headed. If you are in pain, you may need to take or ask for pain medication before doing incentive spirometry. It is harder to take a deep breath if you are having pain. AFTER USE Rest and breathe slowly and easily. It can be helpful to keep track of a log of your progress. Your caregiver can provide you with a simple table to help with this. If you are using the  spirometer at home, follow these instructions: SEEK MEDICAL CARE IF:  You are having difficultly using the spirometer. You have trouble using the spirometer as often as instructed. Your pain medication is not giving enough relief while using the spirometer. You develop fever of 100.5 F (38.1 C) or higher. SEEK IMMEDIATE MEDICAL CARE IF:  You cough up bloody sputum that had not been present before. You develop fever of 102 F (38.9 C) or greater. You develop worsening pain at or near the incision site. MAKE SURE YOU:  Understand these instructions. Will watch your condition. Will get help right away if you are not doing well or get worse. Document Released: 06/03/2006 Document Revised: 04/15/2011 Document Reviewed: 08/04/2006 [facility] Patient Information 2014 [place], [place].   ________________________________________________________________________

## 2023-01-06 NOTE — Progress Notes (Signed)
PERIOPERATIVE PRESCRIPTION FOR IMPLANTED CARDIAC DEVICE PROGRAMMING  Patient Information: Name:  Kevin Wiley  DOB:  1940/10/18  MRN:  [id number]    Planned Procedure:  Right shoulder arthroplasty  Surgeon:  Dr. Jones Broom  Date of Procedure:  01-09-23  Cautery will be used. Yes  Position during surgery:  Supine   Please send documentation back to:  [facility] (Fax # [phone])  Device Information:  Clinic EP Physician:  Dr. Melanee Spry Croitoru   Device Type:  Pacemaker Manufacturer and Phone #:  Medtronic: [phone] Pacemaker Dependent?:  Unknown Date of Last Device Check:  01/20/2023 Normal Device Function?:  Yes.    Electrophysiologist's Recommendations:  Have magnet available. Provide continuous ECG monitoring when magnet is used or reprogramming is to be performed.  Procedure will likely interfere with device function.  Device should be programmed:  Asynchronous pacing during procedure and returned to normal programming after procedure  Per Device Clinic Standing Orders, Lenor Coffin, RN  2:27 PM 01/06/2023

## 2023-01-07 ENCOUNTER — Ambulatory Visit (HOSPITAL_COMMUNITY)
Admission: RE | Admit: 2023-01-07 | Discharge: 2023-01-07 | Disposition: A | Discharge status: Home / Self Care | Payer: Medicare Other | Source: Ambulatory Visit | Attending: Orthopedic Surgery | Admitting: Orthopedic Surgery

## 2023-01-07 ENCOUNTER — Encounter (HOSPITAL_COMMUNITY): Payer: Self-pay

## 2023-01-07 ENCOUNTER — Other Ambulatory Visit: Payer: Self-pay

## 2023-01-07 ENCOUNTER — Encounter (INDEPENDENT_AMBULATORY_CARE_PROVIDER_SITE_OTHER): Payer: Medicare Other | Admitting: Emergency Medicine

## 2023-01-07 ENCOUNTER — Encounter (HOSPITAL_COMMUNITY)
Admission: RE | Admit: 2023-01-07 | Discharge: 2023-01-07 | Disposition: A | Discharge status: Home / Self Care | Payer: Medicare Other | Source: Ambulatory Visit | Attending: Orthopedic Surgery | Admitting: Orthopedic Surgery

## 2023-01-07 VITALS — BP 122/72 | HR 65 | Temp 98.5°F | Ht 69.0 in

## 2023-01-07 DIAGNOSIS — Z01811 Encounter for preprocedural respiratory examination: Secondary | ICD-10-CM | POA: Diagnosis present

## 2023-01-07 DIAGNOSIS — E785 Hyperlipidemia, unspecified: Secondary | ICD-10-CM | POA: Insufficient documentation

## 2023-01-07 DIAGNOSIS — I2510 Atherosclerotic heart disease of native coronary artery without angina pectoris: Secondary | ICD-10-CM | POA: Diagnosis not present

## 2023-01-07 DIAGNOSIS — Z01812 Encounter for preprocedural laboratory examination: Secondary | ICD-10-CM | POA: Diagnosis present

## 2023-01-07 DIAGNOSIS — I7121 Aneurysm of the ascending aorta, without rupture: Secondary | ICD-10-CM | POA: Insufficient documentation

## 2023-01-07 DIAGNOSIS — R2689 Other abnormalities of gait and mobility: Secondary | ICD-10-CM | POA: Diagnosis not present

## 2023-01-07 DIAGNOSIS — Z950 Presence of cardiac pacemaker: Secondary | ICD-10-CM | POA: Diagnosis not present

## 2023-01-07 DIAGNOSIS — Z87891 Personal history of nicotine dependence: Secondary | ICD-10-CM | POA: Insufficient documentation

## 2023-01-07 DIAGNOSIS — R0989 Other specified symptoms and signs involving the circulatory and respiratory systems: Secondary | ICD-10-CM | POA: Diagnosis not present

## 2023-01-07 DIAGNOSIS — Z993 Dependence on wheelchair: Secondary | ICD-10-CM | POA: Diagnosis not present

## 2023-01-07 DIAGNOSIS — R278 Other lack of coordination: Secondary | ICD-10-CM | POA: Diagnosis not present

## 2023-01-07 DIAGNOSIS — G4733 Obstructive sleep apnea (adult) (pediatric): Secondary | ICD-10-CM | POA: Diagnosis not present

## 2023-01-07 DIAGNOSIS — I10 Essential (primary) hypertension: Secondary | ICD-10-CM | POA: Insufficient documentation

## 2023-01-07 DIAGNOSIS — Z01818 Encounter for other preprocedural examination: Secondary | ICD-10-CM | POA: Insufficient documentation

## 2023-01-07 DIAGNOSIS — M62511 Muscle wasting and atrophy, not elsewhere classified, right shoulder: Secondary | ICD-10-CM | POA: Diagnosis not present

## 2023-01-07 DIAGNOSIS — Z981 Arthrodesis status: Secondary | ICD-10-CM | POA: Diagnosis not present

## 2023-01-07 DIAGNOSIS — S2243XA Multiple fractures of ribs, bilateral, initial encounter for closed fracture: Secondary | ICD-10-CM | POA: Diagnosis not present

## 2023-01-07 DIAGNOSIS — I480 Paroxysmal atrial fibrillation: Secondary | ICD-10-CM | POA: Diagnosis not present

## 2023-01-07 DIAGNOSIS — M6259 Muscle wasting and atrophy, not elsewhere classified, multiple sites: Secondary | ICD-10-CM | POA: Diagnosis not present

## 2023-01-07 DIAGNOSIS — I739 Peripheral vascular disease, unspecified: Secondary | ICD-10-CM | POA: Insufficient documentation

## 2023-01-07 DIAGNOSIS — K219 Gastro-esophageal reflux disease without esophagitis: Secondary | ICD-10-CM | POA: Insufficient documentation

## 2023-01-07 DIAGNOSIS — Z01810 Encounter for preprocedural cardiovascular examination: Secondary | ICD-10-CM

## 2023-01-07 DIAGNOSIS — R2681 Unsteadiness on feet: Secondary | ICD-10-CM | POA: Diagnosis not present

## 2023-01-07 DIAGNOSIS — Z79899 Other long term (current) drug therapy: Secondary | ICD-10-CM | POA: Insufficient documentation

## 2023-01-07 DIAGNOSIS — I700 Atherosclerosis of aorta: Secondary | ICD-10-CM | POA: Insufficient documentation

## 2023-01-07 LAB — CBC
HCT: 37.1 % — ABNORMAL LOW (ref 39.0–52.0)
Hemoglobin: 12 g/dL — ABNORMAL LOW (ref 13.0–17.0)
MCH: 32.2 pg (ref 26.0–34.0)
MCHC: 32.3 g/dL (ref 30.0–36.0)
MCV: 99.5 fL (ref 80.0–100.0)
Platelets: 157 10*3/uL (ref 150–400)
RBC: 3.73 MIL/uL — ABNORMAL LOW (ref 4.22–5.81)
RDW: 13.6 % (ref 11.5–15.5)
WBC: 6.6 10*3/uL (ref 4.0–10.5)
nRBC: 0 % (ref 0.0–0.2)

## 2023-01-07 LAB — SURGICAL PCR SCREEN
MRSA, PCR: NEGATIVE
Staphylococcus aureus: NEGATIVE

## 2023-01-07 LAB — BASIC METABOLIC PANEL
Anion gap: 7 (ref 5–15)
BUN: 19 mg/dL (ref 8–23)
CO2: 28 mmol/L (ref 22–32)
Calcium: 8.9 mg/dL (ref 8.9–10.3)
Chloride: 105 mmol/L (ref 98–111)
Creatinine, Ser: 1 mg/dL (ref 0.61–1.24)
GFR, Estimated: >60 mL/min (ref >60)
Glucose, Bld: 79 mg/dL (ref 70–99)
Potassium: 4.3 mmol/L (ref 3.5–5.1)
Sodium: 140 mmol/L (ref 135–145)

## 2023-01-07 NOTE — Progress Notes (Signed)
For Anesthesia: PCP - Geoffry Paradise, MD  Cardiologist - Croitoru, Rachelle Hora, MD  Clearance: pending as per Ronney Asters: NP note from: 01/01/23 Bowel Prep reminder:  Chest x-ray - 01/07/23 EKG - 04/19/22: EPIC Stress Test - 07/05/15 ECHO - 02/25/22 Cardiac Cath - 4/14/8 Pacemaker/ICD device last checked: 01/20/23 Pacemaker orders received: Yes Device Rep notified: Yes. Tobie Lords ([phone]) was notified via email and a message was left on his phone.  Spinal Cord Stimulator: N/A  Sleep Study - Yes CPAP - NO  Fasting Blood Sugar - N/A Checks Blood Sugar _____ times a day Date and result of last Hgb A1c-  Last dose of GLP1 agonist- N/A GLP1 instructions:   Last dose of SGLT-2 inhibitors- N/A SGLT-2 instructions:   Blood Thinner Instructions: N/A Aspirin Instructions: Last Dose:  Activity level: Can go up a flight of stairs and activities of daily living without stopping and without chest pain and/or shortness of breath   Able to exercise without chest pain and/or shortness of breath   Unable to go up a flight of stairs without shortness of breath   Anesthesia review: Hx: CAD,PTSD,PAF,PAD,Pacemaker,Claustrophobia.  Patient denies shortness of breath, fever, cough and chest pain at PAT appointment   Patient verbalized understanding of instructions that were given to them at the PAT appointment. Patient was also instructed that they will need to review over the PAT instructions again at home before surgery.
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
## 2023-01-07 NOTE — Telephone Encounter (Signed)
I s/w the pt and advised per pre op APP he has not been cleared and will need in office appt for further assessment. Pt agreed to appt 01/13/23 @ 8:50 with Bernadene Person, NP at the [facility]. I asked the pt if I needed to call anyone for him as well about appt. Pt answered yes to please call his son Kevin Wiley. I left a detailed message for Kevin Wiley about surgery post poned until pt has been assessed further in person per pre op APP. Pt has appt 01/13/23 Bernadene Person, NP at the [facility] office. If need to change appt to call back and change appt.   I will update Darel Hong with ortho of the appt for the pt.

## 2023-01-07 NOTE — Telephone Encounter (Signed)
Per pre op APP Rise Paganini, NP to let the surgeon's office know the pt is not cleared at this point and will require an appt in the office for further assessment.   I s/w Judy surgery scheduler for Dr. Ave Filter at [facility]. I informed Darel Hong of the above information pt is not cleared at this time. I did tell Darel Hong I will also inform her of the appt we schedule for the pt to keep her in the loop. Darel Hong thanked me for the help.   I will in the mean time fax these notes and the notes from Rise Paganini, NP from today.

## 2023-01-08 ENCOUNTER — Telehealth: Payer: Self-pay | Admitting: Cardiovascular Disease

## 2023-01-08 DIAGNOSIS — M6259 Muscle wasting and atrophy, not elsewhere classified, multiple sites: Secondary | ICD-10-CM | POA: Diagnosis not present

## 2023-01-08 DIAGNOSIS — M19011 Primary osteoarthritis, right shoulder: Secondary | ICD-10-CM | POA: Diagnosis not present

## 2023-01-08 DIAGNOSIS — I499 Cardiac arrhythmia, unspecified: Secondary | ICD-10-CM | POA: Diagnosis not present

## 2023-01-08 DIAGNOSIS — G3184 Mild cognitive impairment, so stated: Secondary | ICD-10-CM | POA: Diagnosis not present

## 2023-01-08 DIAGNOSIS — R278 Other lack of coordination: Secondary | ICD-10-CM | POA: Diagnosis not present

## 2023-01-08 DIAGNOSIS — R2689 Other abnormalities of gait and mobility: Secondary | ICD-10-CM | POA: Diagnosis not present

## 2023-01-08 DIAGNOSIS — M62511 Muscle wasting and atrophy, not elsewhere classified, right shoulder: Secondary | ICD-10-CM | POA: Diagnosis not present

## 2023-01-08 DIAGNOSIS — R2681 Unsteadiness on feet: Secondary | ICD-10-CM | POA: Diagnosis not present

## 2023-01-08 NOTE — Telephone Encounter (Signed)
Son Onalee Hua) wants a call back to confirm if patient has been cleared for surgery.

## 2023-01-08 NOTE — Telephone Encounter (Signed)
Office calling to verify if the pt is cleared for surgery or if he has to have clearance before hand. Please advise

## 2023-01-08 NOTE — Anesthesia Preprocedure Evaluation (Signed)
Anesthesia Evaluation    Airway        Dental   Pulmonary former smoker          Cardiovascular hypertension,      Neuro/Psych    GI/Hepatic   Endo/Other    Renal/GU      Musculoskeletal   Abdominal   Peds  Hematology   Anesthesia Other Findings   Reproductive/Obstetrics                              Anesthesia Physical Anesthesia Plan  ASA:   Anesthesia Plan:    Post-op Pain Management:    Induction:   PONV Risk Score and Plan:   Airway Management Planned:   Additional Equipment:   Intra-op Plan:   Post-operative Plan:   Informed Consent:   Plan Discussed with:   Anesthesia Plan Comments: (See PAT note from 12/3 by Sherlie Ban PA-C )         Anesthesia Quick Evaluation

## 2023-01-08 NOTE — Telephone Encounter (Signed)
Left message for the pt's son Onalee Hua, pt has not been cleared. Pt has appt 01/13/23 for further assessment. Surgeon's office has been made aware pt has not been cleared by cardiology.

## 2023-01-08 NOTE — Telephone Encounter (Signed)
I had s/w Darel Hong yesterday at [facility] that the pt has not been cleared and will need an in office appt. Pt has appt 01/13/23 with Bernadene Person, NP.   I will re-fax notes for their records as well.
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
## 2023-01-08 NOTE — Telephone Encounter (Signed)
Kevin Wiley from Kevin Wiley office called and left vm asking for clarification. Per Kevin Wiley she has notes that Kevin Wiley cleared the pt with moderate risk, however the pre op APP yesterday Kevin Paganini, Kevin Wiley did not clear the pt and said the pt needs appt.   I will forward to pre op APP to review notes for clarification as to if pt has been cleared as stated per Kevin Wiley or not cleared per Kevin Paganini, Kevin Wiley.   I will send notes to surgery scheduler as FYI.

## 2023-01-09 DIAGNOSIS — R2689 Other abnormalities of gait and mobility: Secondary | ICD-10-CM | POA: Diagnosis not present

## 2023-01-09 DIAGNOSIS — R2681 Unsteadiness on feet: Secondary | ICD-10-CM | POA: Diagnosis not present

## 2023-01-09 DIAGNOSIS — M62511 Muscle wasting and atrophy, not elsewhere classified, right shoulder: Secondary | ICD-10-CM | POA: Diagnosis not present

## 2023-01-09 DIAGNOSIS — G8929 Other chronic pain: Secondary | ICD-10-CM | POA: Diagnosis not present

## 2023-01-09 DIAGNOSIS — M6259 Muscle wasting and atrophy, not elsewhere classified, multiple sites: Secondary | ICD-10-CM | POA: Diagnosis not present

## 2023-01-09 DIAGNOSIS — M159 Polyosteoarthritis, unspecified: Secondary | ICD-10-CM | POA: Diagnosis not present

## 2023-01-09 DIAGNOSIS — R278 Other lack of coordination: Secondary | ICD-10-CM | POA: Diagnosis not present

## 2023-01-09 DIAGNOSIS — Z01818 Encounter for other preprocedural examination: Secondary | ICD-10-CM

## 2023-01-09 DIAGNOSIS — Z993 Dependence on wheelchair: Secondary | ICD-10-CM | POA: Diagnosis not present

## 2023-01-09 DIAGNOSIS — I2510 Atherosclerotic heart disease of native coronary artery without angina pectoris: Secondary | ICD-10-CM

## 2023-01-09 NOTE — Telephone Encounter (Signed)
Preoperative team, patient has not been cleared to proceed with shoulder surgery.  He will need office visit for evaluation.  He is not appropriate for virtual visit/telephone visit.  Patient has not been seen by Dr. Royann Shivers since 09/05/2022.  Thank you for your help.  Kevin Wiley. Leida Luton NP-C     01/09/2023, 6:48 AM [facility] [address] Office [phone] Fax [phone]

## 2023-01-09 NOTE — Telephone Encounter (Signed)
Thank you Edd Fabian, FNP for clarification on clearance. I will fax notes to surgeon office.

## 2023-01-13 ENCOUNTER — Ambulatory Visit: Payer: Medicare Other | Admitting: Nurse Practitioner

## 2023-01-13 ENCOUNTER — Telehealth: Payer: Self-pay | Admitting: *Deleted

## 2023-01-13 NOTE — Telephone Encounter (Signed)
Patient's appointment has been rescheduled to 01/27/23 at 8:50am.

## 2023-01-13 NOTE — Telephone Encounter (Signed)
LMOM (DPR) for patient and LMOVM for son advising that appointment scheduled for this morning will need to be rescheduled due to provider emergency. Main number provided for call back.
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
## 2023-01-14 DIAGNOSIS — M62511 Muscle wasting and atrophy, not elsewhere classified, right shoulder: Secondary | ICD-10-CM | POA: Diagnosis not present

## 2023-01-14 DIAGNOSIS — R2681 Unsteadiness on feet: Secondary | ICD-10-CM | POA: Diagnosis not present

## 2023-01-14 DIAGNOSIS — R278 Other lack of coordination: Secondary | ICD-10-CM | POA: Diagnosis not present

## 2023-01-14 DIAGNOSIS — M6259 Muscle wasting and atrophy, not elsewhere classified, multiple sites: Secondary | ICD-10-CM | POA: Diagnosis not present

## 2023-01-14 DIAGNOSIS — R2689 Other abnormalities of gait and mobility: Secondary | ICD-10-CM | POA: Diagnosis not present

## 2023-01-15 NOTE — Progress Notes (Addendum)
Cardiology Office Note:    Date:  01/27/2023   ID:  Kevin Wiley, DOB Aug 05, 1940, MRN [id number]  PCP:  Geoffry Paradise, MD  Cardiologist:  Thurmon Fair, MD     Referring MD: Geoffry Paradise, MD   Chief Complaint: pre-op evaluation for upcoming shoulder surgery  History of Present Illness:    Kevin Wiley is a 82 y.o. male with a history of CAD s/p BMS  to LAD in 1999, paroxysmal atrial tachycardia, sinus node dysfunction s/p dual chamber Medtronic PPM in in 2007 with gen change in  06/2013, PAD with bilateral renal stenosis s/p stenting in 1999, ascending aortic aneurysm, AAA, hypertension complicated by episodes of orthostatic hypotension, hyperlipidemia, GERD, Crohn disease, BPH, PTSD, and dementia who is followed by Dr. Royann Shivers and presents today for pre-op evaluation for upcoming shoulder surgery.  Patient has a history of CAD with remote PCI with BMS to LAD in 1999. Last cardiac catheterization in 2008 showed only mild to moderate non-obstructive disease. He has had multiple stress test since that time. Most recent Myoview was in 06/2015 which was low risk and showed a fixed inferoseptal defect but no reversible ischemia. He has a history of PAT and sinus node dysfunction and had a PPM placed in 2007 with subsequent gen change in 2015. He also has a history of PAD with known bilateral renal artery stenosis s/p stenting in 1999, ascending thoracic aorta, and AAA. Last Echo in 02/2018 showed LVEF of 60-65% with normal wall motion and grade 1 diastolic dysfunction, as well as mild AI, mild TR, and a mildly dilated ascending aorta measuring 41 mm.  CTA in 05/2020 showed stable fusiform aneurysm of the ascending thoracic aorta measuring 4.4cm but enlargement of the AAA to 4.0cm (up from 3.5cm in 02/2018). CTA also showed that the previously placed right renal artery stent was occluded.   He was last seen by Dr. Royann Shivers in 09/2022 at which time he was stable from a cardiac standpoint. He  mentioned need for upcoming shoulder surgery and he was felt to be at acceptable risk for this. However, it looks like this surgery was delayed and it is now scheduled for 02/13/2023.  He had a telehealth pre-op visit on 01/07/2023 but was mainly wheelchair bound and not able to complete >4.0 METs of physical activity. Therefore, an in-office was recommended prior to surgery.   He is here today with his son. He Is essentially wheelchair bound but denies any chest pain or shortness of breath. No orthopnea or PND. He does report some lower extremity edema and is right leg is noticeably larger than the left. Patient and son are not sure if this is normal for him or not. He denies any palpitations, lightheadedness, dizziness or syncope.   EKGs/Labs/Other Studies Reviewed:    The following studies were reviewed:  Myoview 07/05/2015: T wave inversion was noted during stress in the I, II, aVL, V2, V3, V4, V5 and V6 leads. T wave inversion persisted. There was no ST segment deviation noted during stress. Defect 1: There is a medium defect of mild severity present in the basal inferoseptal and mid inferoseptal location. This is a low risk study. Nuclear stress EF: 50%.   Low risk stress nuclear study with a fixed inferoseptal defect, likely due to artifact, otherwise normal perfusion. No reversible ischemia is seen.  Borderline left ventricular global systolic function. _______________  Echocardiogram 02/25/2018: Study Conclusions: - Left ventricle: The cavity size was normal. Wall thickness was  increased in a pattern of moderate LVH. Systolic function was    normal. The estimated ejection fraction was in the range of 60%    to 65%. Wall motion was normal; there were no regional wall    motion abnormalities. Doppler parameters are consistent with    abnormal left ventricular relaxation (grade 1 diastolic    dysfunction). Doppler parameters are consistent with    indeterminate ventricular filling  pressure.  - Aortic valve: Transvalvular velocity was within the normal range.    There was no stenosis. There was mild regurgitation.  - Aorta: Ascending aortic diameter: 41 mm (S).  - Ascending aorta: The ascending aorta was mildly dilated.  - Mitral valve: Transvalvular velocity was within the normal range.    There was no evidence for stenosis. There was trivial    regurgitation.  - Left atrium: The atrium was mildly dilated.  - Right ventricle: The cavity size was normal. Wall thickness was    normal. Systolic function was normal.  - Tricuspid valve: There was mild regurgitation.  - Pulmonary arteries: Systolic pressure was within the normal    range. PA peak pressure: 19 mm Hg (S).  _______________  Chest/ Abdominal/ Pelvic CTA 05/05/2020: Impression: 1. Enlargement of the abdominal aortic aneurysm. Abdominal aortic aneurysm measures 4.0 cm and measured 3.5 cm on 02/24/2018. Recommend follow-up every 12 months and vascular consultation. This recommendation follows ACR consensus guidelines: White Paper of the ACR Incidental Findings Committee II on Vascular Findings. J Am Coll Radiol 2013; 10:789-794. 2. Stable fusiform aneurysm of the ascending thoracic aorta measuring up to 4.4 cm. Recommend annual imaging followup by CTA or MRA. This recommendation follows 2010 ACCF/AHA/AATS/ACR/ASA/SCA/SCAI/SIR/STS/SVM Guidelines for the Diagnosis and Management of Patients with Thoracic Aortic Disease. Circulation. 2010; 121: Z610-R604. Aortic aneurysm NOS (ICD10-I71.9) 3. Occlusion of the right renal artery stent. This finding is new since 2020. 4. Left renal artery stent is patent. 5. Stable 1.3 cm hyperdense structure in the right kidney. This could represent a hemorrhagic or proteinaceous cyst but indeterminate. Favor a benign etiology based on stability but recommend attention on follow-up imaging. 6. No acute abnormality in the abdomen or pelvis. 7. Prostate enlargement. 8.  Colonic diverticulosis.  No acute bowel inflammation.  EKG:  EKG ordered today.   EKG Interpretation Date/Time:  Monday January 27 2023 09:14:39 EST Ventricular Rate:  65 PR Interval:  262 QRS Duration:  116 QT Interval:  454 QTC Calculation: 472 R Axis:   -31  Text Interpretation: Atrial-paced rhythm with prolonged AV conduction Left axis deviation Left ventricular hypertrophy T wave inversions in anterolateral leads  No significant ST/ T wave changes compared to prior tracings. Confirmed by Marjie Skiff [id number]) on 01/27/2023 9:46:21 AM    Recent Labs: 04/19/2022: ALT 14; TSH 1.012 01/07/2023: BUN 19; Creatinine, Ser 1.00; Hemoglobin 12.0; Platelets 157; Potassium 4.3; Sodium 140  Recent Lipid Panel    Component Value Date/Time   CHOL 190 10/15/2017 1119   TRIG 136 10/15/2017 1119   HDL 40 10/15/2017 1119   CHOLHDL 4.8 10/15/2017 1119   CHOLHDL 3.9 05/05/2013 0520   VLDL 27 05/05/2013 0520   LDLCALC 123 (H) 10/15/2017 1119    Physical Exam:    Vital Signs: BP 132/76   Pulse 65   Ht 5\' 9"  (1.753 m)   Wt 168 lb 6.4 oz (76.4 kg)   SpO2 97%   BMI 24.87 kg/m     Wt Readings from Last 3 Encounters:  01/27/23 168 lb 6.4 oz (  76.4 kg)  09/05/22 164 lb 12.8 oz (74.8 kg)  04/19/22 154 lb 15.7 oz (70.3 kg)     General: 82 y.o. Caucasian male in no acute distress. HEENT: Normocephalic and atraumatic. Sclera clear.  Neck: Supple. Marland Kitchen No JVD. Heart: RRR. Soft I-II/VI systolic murmur.  Lungs: No increased work of breathing. Clear to ausculation bilaterally. No wheezes, rhonchi, or rales.  Abdomen: Soft, non-distended, and non-tender to palpation.  Extremities: 1+ pitting edema of right lower extremity. Trace lower extremity edema of left lower extremity. Skin: Warm and dry. Neuro: No focal deficits. Psych: Normal affect. Responds appropriately.  Assessment:    1. Preoperative clearance   2. Coronary artery disease involving native coronary artery of native heart  without angina pectoris   3. Paroxysmal atrial tachycardia (HCC)   4. Sinus node dysfunction (HCC)   5. S/P placement of cardiac pacemaker   6. Renal artery stenosis (HCC)   7. Abdominal aortic aneurysm (AAA) without rupture, unspecified part (HCC)   8. Primary hypertension   9. Hyperlipidemia, unspecified hyperlipidemia type   10. Lower extremity edema     Plan:    Pre-Op Evaluation Patient is scheduled to have shoulder surgery on 02/13/2023. He is doing well from a cardiac standpoint but is wheelchair bound and unable to complete 4.0 METs. Discussed with Dr. Royann Shivers and given this is a low risk procedure, OK to proceed without any additional cardiac work-up (specifically, no Myoview). Please continue beta-blocker perioperatively. He does have asymmetric swelling of his right lower extremity today on exam. I am getting a lower extremity venous doppler today. If this is positive for DVT, surgery will likely need to be delayed.   CAD S/p remote BMS to LAD in 1999. Most recent Myoview in 2017 showed no evidence of ischemia.  - No chest pain.  - Does not appear to be on Aspirin. If lower extremity venous dopplers are negative for DVT, will recommend he start Aspirin 81mg  daily. - Continue beta-blocker and statin.   Paroxysmal Atrial Tachycardia He has a history of PAT. - Stable. No reports of palpitations.  - Continue Atenolol 100mg  daily.   Sinus Node Dysfunction s/p PPM 2nd Degree AV Block He has a history of sinus node dysfunction and infrequent 2nd degree AV block. S/p dual chamber Medtronic PPM in 2007 with gen change in 2015. Last device check in 09/2022 showed >97% atrial pacing and <5% ventricular pacing.  - Followed by Dr. Royann Shivers.   Renal Artery Stenosis S/p bilateral renal artery stenting in 1999. CTA in 2022 showed occluded right renal artery stent but patent stent on the left.  - Does not appear to be on Aspirin.  If lower extremity venous dopplers are negative for DVT,  will recommend he start Aspirin 81mg  daily. - Continue statin.   Ascending Thoracic Aortic Aneurysm AAA Last CTA in 2022 showed stable fusiform aneurysm of the ascending thoracic aorta measuring 4.4cm but enlargement of the AAA to 4.0cm (up from 3.5cm in 02/2018) - Per Dr. Erin Hearing last note in 09/2022: "With his advancing age, increasing frailty, and worsening memory, I do not think he will be a candidate for surgical repair of any of these. It is questionable whether he would benefit from routine monitoring."  Hypertension Orthostatic Hypotension He has a history of hypertension complicated by orthostatic hypotension. Previous attempts at very aggressive BP control have led to orthostatic hypotension.  - BP initially 158/81 but then improved to 132/76 on my personal recheck at the end of the visit. -  No complaints of orthostasis.  - Continue Atenolol 100mg  daily.   Hyperlipidemia - Continue Lipitor 40mg  daily.  - Did not have time to specifically discuss today.   Lower Extremity Edema Patient reports some lower extremity edema for the last several months. His right leg is noticeably larger than his left leg - patient/ son do not know if this is normal for him. - Will get lower extremity venous doppler today to rule out DVT as he is immobile (essentially wheelchair bound).  Disposition: Follow up in 6 months.    Leanne Lovely, PA-C  01/27/2023 10:46 AM    [facility]  ADDENDUM 02/03/2023: Lower extremity venous doppler showed no evidence of DVT. Given history of orthostatic hypotension, hesistant to add a diuretic for his edema. Recommended compression stockings and elevating legs as much as possible. OK to proceed with shoulder surgery.I will route this recommendation to the requesting party via Epic fax function.  Corrin Parker, PA-C 02/03/2023 11:08 PM

## 2023-01-16 DIAGNOSIS — M6259 Muscle wasting and atrophy, not elsewhere classified, multiple sites: Secondary | ICD-10-CM | POA: Diagnosis not present

## 2023-01-16 DIAGNOSIS — R2681 Unsteadiness on feet: Secondary | ICD-10-CM | POA: Diagnosis not present

## 2023-01-16 DIAGNOSIS — M62511 Muscle wasting and atrophy, not elsewhere classified, right shoulder: Secondary | ICD-10-CM | POA: Diagnosis not present

## 2023-01-16 DIAGNOSIS — R278 Other lack of coordination: Secondary | ICD-10-CM | POA: Diagnosis not present

## 2023-01-16 DIAGNOSIS — R2689 Other abnormalities of gait and mobility: Secondary | ICD-10-CM | POA: Diagnosis not present

## 2023-01-20 DIAGNOSIS — M62511 Muscle wasting and atrophy, not elsewhere classified, right shoulder: Secondary | ICD-10-CM | POA: Diagnosis not present

## 2023-01-20 DIAGNOSIS — R2681 Unsteadiness on feet: Secondary | ICD-10-CM | POA: Diagnosis not present

## 2023-01-20 DIAGNOSIS — R278 Other lack of coordination: Secondary | ICD-10-CM | POA: Diagnosis not present

## 2023-01-20 DIAGNOSIS — R2689 Other abnormalities of gait and mobility: Secondary | ICD-10-CM | POA: Diagnosis not present

## 2023-01-20 DIAGNOSIS — M6259 Muscle wasting and atrophy, not elsewhere classified, multiple sites: Secondary | ICD-10-CM | POA: Diagnosis not present

## 2023-01-21 DIAGNOSIS — N1831 Chronic kidney disease, stage 3a: Secondary | ICD-10-CM | POA: Diagnosis not present

## 2023-01-21 DIAGNOSIS — F5105 Insomnia due to other mental disorder: Secondary | ICD-10-CM | POA: Diagnosis not present

## 2023-01-21 DIAGNOSIS — I129 Hypertensive chronic kidney disease with stage 1 through stage 4 chronic kidney disease, or unspecified chronic kidney disease: Secondary | ICD-10-CM | POA: Diagnosis not present

## 2023-01-21 DIAGNOSIS — K219 Gastro-esophageal reflux disease without esophagitis: Secondary | ICD-10-CM | POA: Diagnosis not present

## 2023-01-22 DIAGNOSIS — M6259 Muscle wasting and atrophy, not elsewhere classified, multiple sites: Secondary | ICD-10-CM | POA: Diagnosis not present

## 2023-01-22 DIAGNOSIS — M62511 Muscle wasting and atrophy, not elsewhere classified, right shoulder: Secondary | ICD-10-CM | POA: Diagnosis not present

## 2023-01-22 DIAGNOSIS — R278 Other lack of coordination: Secondary | ICD-10-CM | POA: Diagnosis not present

## 2023-01-22 DIAGNOSIS — R2689 Other abnormalities of gait and mobility: Secondary | ICD-10-CM | POA: Diagnosis not present

## 2023-01-22 DIAGNOSIS — R2681 Unsteadiness on feet: Secondary | ICD-10-CM | POA: Diagnosis not present

## 2023-01-23 DIAGNOSIS — Z01818 Encounter for other preprocedural examination: Secondary | ICD-10-CM

## 2023-01-23 DIAGNOSIS — I2510 Atherosclerotic heart disease of native coronary artery without angina pectoris: Secondary | ICD-10-CM

## 2023-01-24 DIAGNOSIS — R278 Other lack of coordination: Secondary | ICD-10-CM | POA: Diagnosis not present

## 2023-01-24 DIAGNOSIS — M62511 Muscle wasting and atrophy, not elsewhere classified, right shoulder: Secondary | ICD-10-CM | POA: Diagnosis not present

## 2023-01-24 DIAGNOSIS — R2681 Unsteadiness on feet: Secondary | ICD-10-CM | POA: Diagnosis not present

## 2023-01-24 DIAGNOSIS — R2689 Other abnormalities of gait and mobility: Secondary | ICD-10-CM | POA: Diagnosis not present

## 2023-01-24 DIAGNOSIS — M6259 Muscle wasting and atrophy, not elsewhere classified, multiple sites: Secondary | ICD-10-CM | POA: Diagnosis not present

## 2023-01-27 ENCOUNTER — Ambulatory Visit (HOSPITAL_COMMUNITY)
Admission: RE | Admit: 2023-01-27 | Discharge: 2023-01-27 | Disposition: A | Discharge status: Home / Self Care | Payer: Medicare Other | Source: Ambulatory Visit | Attending: Cardiology | Admitting: Cardiology

## 2023-01-27 ENCOUNTER — Encounter: Payer: Self-pay | Admitting: Student

## 2023-01-27 ENCOUNTER — Ambulatory Visit: Payer: Medicare Other | Admitting: Student

## 2023-01-27 VITALS — BP 132/76 | HR 65 | Ht 69.0 in | Wt 168.4 lb

## 2023-01-27 DIAGNOSIS — I2510 Atherosclerotic heart disease of native coronary artery without angina pectoris: Secondary | ICD-10-CM

## 2023-01-27 DIAGNOSIS — R600 Localized edema: Secondary | ICD-10-CM

## 2023-01-27 DIAGNOSIS — I495 Sick sinus syndrome: Secondary | ICD-10-CM | POA: Diagnosis not present

## 2023-01-27 DIAGNOSIS — I82401 Acute embolism and thrombosis of unspecified deep veins of right lower extremity: Secondary | ICD-10-CM

## 2023-01-27 DIAGNOSIS — I714 Abdominal aortic aneurysm, without rupture, unspecified: Secondary | ICD-10-CM

## 2023-01-27 DIAGNOSIS — I701 Atherosclerosis of renal artery: Secondary | ICD-10-CM

## 2023-01-27 DIAGNOSIS — Z01818 Encounter for other preprocedural examination: Secondary | ICD-10-CM

## 2023-01-27 DIAGNOSIS — I10 Essential (primary) hypertension: Secondary | ICD-10-CM | POA: Diagnosis not present

## 2023-01-27 DIAGNOSIS — I4719 Other supraventricular tachycardia: Secondary | ICD-10-CM

## 2023-01-27 DIAGNOSIS — Z950 Presence of cardiac pacemaker: Secondary | ICD-10-CM

## 2023-01-27 DIAGNOSIS — E785 Hyperlipidemia, unspecified: Secondary | ICD-10-CM

## 2023-01-27 NOTE — Patient Instructions (Signed)
Medication Instructions:  NO CHANGES     Lab Work: NONE   Testing/Procedures: Your physician has requested that you have a lower extremity venous duplex. This test is an ultrasound of the veins in the legs or arms. It looks at venous blood flow that carries blood from the heart to the legs or arms. Allow one hour for a Lower Venous exam. Allow thirty minutes for an Upper Venous exam. There are no restrictions or special instructions.  Please note: We ask at that you not bring children with you during ultrasound (echo/ vascular) testing. Due to room size and safety concerns, children are not allowed in the ultrasound rooms during exams. Our front office staff cannot provide observation of children in our lobby area while testing is being conducted. An adult accompanying a patient to their appointment will only be allowed in the ultrasound room at the discretion of the ultrasound technician under special circumstances. We apologize for any inconvenience.    Follow-Up: At [facility], you and your health needs are our priority.  As part of our continuing mission to provide you with exceptional heart care, we have created designated Provider Care Teams.  These Care Teams include your primary Cardiologist (physician) and Advanced Practice Providers (APPs -  Physician Assistants and Nurse Practitioners) who all work together to provide you with the care you need, when you need it.   Your next appointment:   6 month(s)   Provider:   Marjie Skiff, PA-C OR Thurmon Fair, MD will plan to see you again in 6 month(s).

## 2023-01-28 DIAGNOSIS — R2681 Unsteadiness on feet: Secondary | ICD-10-CM | POA: Diagnosis not present

## 2023-01-28 DIAGNOSIS — M62511 Muscle wasting and atrophy, not elsewhere classified, right shoulder: Secondary | ICD-10-CM | POA: Diagnosis not present

## 2023-01-28 DIAGNOSIS — R278 Other lack of coordination: Secondary | ICD-10-CM | POA: Diagnosis not present

## 2023-01-28 DIAGNOSIS — R2689 Other abnormalities of gait and mobility: Secondary | ICD-10-CM | POA: Diagnosis not present

## 2023-01-28 DIAGNOSIS — M6259 Muscle wasting and atrophy, not elsewhere classified, multiple sites: Secondary | ICD-10-CM | POA: Diagnosis not present

## 2023-01-31 DIAGNOSIS — M6259 Muscle wasting and atrophy, not elsewhere classified, multiple sites: Secondary | ICD-10-CM | POA: Diagnosis not present

## 2023-01-31 DIAGNOSIS — R2689 Other abnormalities of gait and mobility: Secondary | ICD-10-CM | POA: Diagnosis not present

## 2023-01-31 DIAGNOSIS — M62511 Muscle wasting and atrophy, not elsewhere classified, right shoulder: Secondary | ICD-10-CM | POA: Diagnosis not present

## 2023-01-31 DIAGNOSIS — R2681 Unsteadiness on feet: Secondary | ICD-10-CM | POA: Diagnosis not present

## 2023-01-31 DIAGNOSIS — R278 Other lack of coordination: Secondary | ICD-10-CM | POA: Diagnosis not present

## 2023-02-01 DIAGNOSIS — I10 Essential (primary) hypertension: Secondary | ICD-10-CM | POA: Diagnosis not present

## 2023-02-01 DIAGNOSIS — I2510 Atherosclerotic heart disease of native coronary artery without angina pectoris: Secondary | ICD-10-CM | POA: Diagnosis not present

## 2023-02-02 DIAGNOSIS — M6259 Muscle wasting and atrophy, not elsewhere classified, multiple sites: Secondary | ICD-10-CM | POA: Diagnosis not present

## 2023-02-02 DIAGNOSIS — M62511 Muscle wasting and atrophy, not elsewhere classified, right shoulder: Secondary | ICD-10-CM | POA: Diagnosis not present

## 2023-02-02 DIAGNOSIS — R2681 Unsteadiness on feet: Secondary | ICD-10-CM | POA: Diagnosis not present

## 2023-02-02 DIAGNOSIS — R2689 Other abnormalities of gait and mobility: Secondary | ICD-10-CM | POA: Diagnosis not present

## 2023-02-02 DIAGNOSIS — R278 Other lack of coordination: Secondary | ICD-10-CM | POA: Diagnosis not present

## 2023-02-04 DIAGNOSIS — R2689 Other abnormalities of gait and mobility: Secondary | ICD-10-CM | POA: Diagnosis not present

## 2023-02-04 DIAGNOSIS — R278 Other lack of coordination: Secondary | ICD-10-CM | POA: Diagnosis not present

## 2023-02-04 DIAGNOSIS — M6259 Muscle wasting and atrophy, not elsewhere classified, multiple sites: Secondary | ICD-10-CM | POA: Diagnosis not present

## 2023-02-04 DIAGNOSIS — M62511 Muscle wasting and atrophy, not elsewhere classified, right shoulder: Secondary | ICD-10-CM | POA: Diagnosis not present

## 2023-02-04 DIAGNOSIS — R2681 Unsteadiness on feet: Secondary | ICD-10-CM | POA: Diagnosis not present

## 2023-02-08 NOTE — Progress Notes (Addendum)
 Patient resides at [facility]. RN contact is Kevin Wiley - [phone] ([email or web address]) He will return to this level of care and receive all therapy at the facility. Kevin Wiley, is POA [phone]. ([id number])   COVID Vaccine received:  []  No [x]  Yes Date of any COVID positive Test in last 90 days:  PCP - Charlie Love, MD  Cardiologist - Jerel Balding, MD   Aline Door PA-C  cardiac clearance in 01-27-23 note  Chest x-ray - 01-07-2023   2v  Epic EKG -  04-19-2022  Epic Stress Test - 07-05-2015  Epic ECHO - 02-25-2018  Epic Cardiac Cath - 05-19-2006  Renal artery Duplex- 10-06-2019   Stents patent CTA 2022- showed Occluded right renal artery stent.  Left stent is patent  PCR screen: [x]  Ordered & Completed []   No Order but Needs PROFEND     []   N/A for this surgery  Surgery Plan:  [x]  Ambulatory   []  Outpatient in bed  []  Admit Anesthesia:    []  General  []  Spinal  [x]   Choice []   MAC  Pacemaker / ICD device []  No [x]  Yes  Medtronic Adapta ADDRL1   Right chest.    Device order in chart Spinal Cord Stimulator:[x]  No []  Yes       History of Sleep Apnea? []  No [x]  Yes   CPAP used?- [x]  No []  Yes    Does the patient monitor blood sugar?   [x]  N/A   []  No []  Yes  Patient has: [x]  NO Hx DM   []  Pre-DM   []  DM1  []   DM2  Blood Thinner / Instructions:None Aspirin  Instructions:  none  ERAS Protocol Ordered: []  No  [x]  Yes PRE-SURGERY [x]  ENSURE  []  G2  Patient is to be NPO after: 0430  Dental hx: []  Dentures:    [x]  N/A      []  Bridge or Partial:                    [x]  Loose or Damaged teeth: most of teeth have been extracted.   Comments: This surgery was rescheduled from 01-09-23 d/t lack of cardiac clearance.   Activity level: Patient is unable to climb a flight of stairs without difficulty; [x]  No CP Patient is wheelchair bound.  Patient  can not perform ADLs without assistance, lives at [facility]  Anesthesia review: SSS- has Medtronic  PPM (right chart) -device orders on chart, HOH- HAs, CAD- LHC/ BMS x1, renal artery stenosis- stents placed 1999, PAF, AAA, murmur, GERD, PTSD- [facility], [facility]- No CPAP - claustrophobia, Some dementia- son is POA,   Patient was lucid and appropriate with his answers to all my PST questions. His son, Wiley, was present and did not correct any of his answers. He has been taking all  his medications as directed with no lapses as per his MAR from [facility] that Chloe Cruthis, CpH received.   Kevin Rosella, RN called me back and she has received all of the patient's surgery preop information, she will call me back if there are any questions.   Patient denies shortness of breath, fever, cough and chest pain at PAT appointment.  Patient and his son verbalized understanding and agreement to the Pre-Surgical Instructions that were given to them at this PAT appointment. I will email all of his preop surgery instructions to [place], RN at [facility] and the son is aware. I reviewed the appropriate phone numbers to call if they have any and  questions or concerns.

## 2023-02-10 ENCOUNTER — Other Ambulatory Visit: Payer: Self-pay

## 2023-02-10 ENCOUNTER — Encounter (HOSPITAL_COMMUNITY)
Admission: RE | Admit: 2023-02-10 | Discharge: 2023-02-10 | Disposition: A | Discharge status: Home / Self Care | Payer: Medicare Other | Source: Ambulatory Visit | Attending: Orthopedic Surgery | Admitting: Orthopedic Surgery

## 2023-02-10 ENCOUNTER — Encounter (HOSPITAL_COMMUNITY): Payer: Self-pay

## 2023-02-10 VITALS — BP 156/80 | HR 62 | Temp 98.9°F | Resp 12 | Ht 69.0 in | Wt 168.0 lb

## 2023-02-10 DIAGNOSIS — I480 Paroxysmal atrial fibrillation: Secondary | ICD-10-CM | POA: Diagnosis not present

## 2023-02-10 DIAGNOSIS — F32A Depression, unspecified: Secondary | ICD-10-CM | POA: Diagnosis not present

## 2023-02-10 DIAGNOSIS — Z01818 Encounter for other preprocedural examination: Secondary | ICD-10-CM

## 2023-02-10 DIAGNOSIS — Z87891 Personal history of nicotine dependence: Secondary | ICD-10-CM | POA: Diagnosis not present

## 2023-02-10 DIAGNOSIS — G4733 Obstructive sleep apnea (adult) (pediatric): Secondary | ICD-10-CM | POA: Insufficient documentation

## 2023-02-10 DIAGNOSIS — N189 Chronic kidney disease, unspecified: Secondary | ICD-10-CM | POA: Diagnosis not present

## 2023-02-10 DIAGNOSIS — Z01812 Encounter for preprocedural laboratory examination: Secondary | ICD-10-CM | POA: Diagnosis present

## 2023-02-10 DIAGNOSIS — I714 Abdominal aortic aneurysm, without rupture, unspecified: Secondary | ICD-10-CM | POA: Diagnosis not present

## 2023-02-10 DIAGNOSIS — K219 Gastro-esophageal reflux disease without esophagitis: Secondary | ICD-10-CM | POA: Diagnosis not present

## 2023-02-10 DIAGNOSIS — K5090 Crohn's disease, unspecified, without complications: Secondary | ICD-10-CM | POA: Insufficient documentation

## 2023-02-10 DIAGNOSIS — E782 Mixed hyperlipidemia: Secondary | ICD-10-CM | POA: Diagnosis not present

## 2023-02-10 DIAGNOSIS — F0393 Unspecified dementia, unspecified severity, with mood disturbance: Secondary | ICD-10-CM | POA: Diagnosis not present

## 2023-02-10 DIAGNOSIS — F0394 Unspecified dementia, unspecified severity, with anxiety: Secondary | ICD-10-CM | POA: Insufficient documentation

## 2023-02-10 DIAGNOSIS — I1310 Hypertensive heart and chronic kidney disease without heart failure, with stage 1 through stage 4 chronic kidney disease, or unspecified chronic kidney disease: Secondary | ICD-10-CM | POA: Insufficient documentation

## 2023-02-10 DIAGNOSIS — I2510 Atherosclerotic heart disease of native coronary artery without angina pectoris: Secondary | ICD-10-CM | POA: Diagnosis not present

## 2023-02-10 DIAGNOSIS — I701 Atherosclerosis of renal artery: Secondary | ICD-10-CM | POA: Diagnosis not present

## 2023-02-10 DIAGNOSIS — I10 Essential (primary) hypertension: Secondary | ICD-10-CM

## 2023-02-10 HISTORY — DX: Chronic kidney disease, unspecified: N18.9

## 2023-02-10 LAB — CBC
HCT: 38.7 % — ABNORMAL LOW (ref 39.0–52.0)
Hemoglobin: 13 g/dL (ref 13.0–17.0)
MCH: 32.8 pg (ref 26.0–34.0)
MCHC: 33.6 g/dL (ref 30.0–36.0)
MCV: 97.7 fL (ref 80.0–100.0)
Platelets: 151 10*3/uL (ref 150–400)
RBC: 3.96 MIL/uL — ABNORMAL LOW (ref 4.22–5.81)
RDW: 13.2 % (ref 11.5–15.5)
WBC: 6 10*3/uL (ref 4.0–10.5)
nRBC: 0 % (ref 0.0–0.2)

## 2023-02-10 LAB — BASIC METABOLIC PANEL
Anion gap: 8 (ref 5–15)
BUN: 19 mg/dL (ref 8–23)
CO2: 27 mmol/L (ref 22–32)
Calcium: 9.2 mg/dL (ref 8.9–10.3)
Chloride: 104 mmol/L (ref 98–111)
Creatinine, Ser: 1.17 mg/dL (ref 0.61–1.24)
GFR, Estimated: >60 mL/min (ref >60)
Glucose, Bld: 154 mg/dL — ABNORMAL HIGH (ref 70–99)
Potassium: 4.4 mmol/L (ref 3.5–5.1)
Sodium: 139 mmol/L (ref 135–145)

## 2023-02-10 LAB — SURGICAL PCR SCREEN
MRSA, PCR: NEGATIVE
Staphylococcus aureus: NEGATIVE

## 2023-02-10 NOTE — Patient Instructions (Addendum)
 SURGICAL WAITING ROOM VISITATION Patients having surgery or a procedure may have no more than 2 support people in the waiting area - these visitors may rotate in the visitor waiting room.   Due to an increase in RSV and influenza rates and associated hospitalizations, children ages 91 and under may not visit patients in [facility] hospitals. If the patient needs to stay at the hospital during part of their recovery, the visitor guidelines for inpatient rooms apply.  PRE-OP VISITATION  Pre-op nurse will coordinate an appropriate time for 1 support person to accompany the patient in pre-op.  This support person may not rotate.  This visitor will be contacted when the time is appropriate for the visitor to come back in the pre-op area.  Please refer to the [facility] website for the visitor guidelines for Inpatients (after your surgery is over and you are in a regular room).  You are not required to quarantine at this time prior to your surgery. However, you must do this: Hand Hygiene often Do NOT share personal items Notify your provider if you are in close contact with someone who has COVID or you develop fever 100.4 or greater, new onset of sneezing, cough, sore throat, shortness of breath or body aches.  If you test positive for Covid or have been in contact with anyone that has tested positive in the last 10 days please notify you surgeon.    Your procedure is scheduled on:  Thursday  February 13, 2023  Report to [facility] Main Entrance: [place] entrance where the [employer] is available.   Report to admitting at: 05:15    AM  Call this number if you have any questions or problems the morning of surgery [phone]  Do not eat food after Midnight the night prior to your surgery/procedure.  After Midnight you may have the following liquids until  04:30 AM DAY OF SURGERY  Clear Liquid Diet Water Black Coffee (sugar ok, NO MILK/CREAM OR CREAMERS)  Tea (sugar ok, NO  MILK/CREAM OR CREAMERS) regular and decaf                             Plain Jell-O  with no fruit (NO RED)                                           Fruit ices (not with fruit pulp, NO RED)                                     Popsicles (NO RED)                                                                  Juice: NO CITRUS JUICES: only apple, WHITE grape, WHITE cranberry Sports drinks like Gatorade or Powerade (NO RED)                The day of surgery:  Drink ONE (1) Pre-Surgery Clear Ensure at  04:30 AM the morning of surgery. Drink in one sitting.  Do not sip.  This drink was given to you during your hospital pre-op appointment visit. Nothing else to drink after completing the Pre-Surgery Clear Ensure  : No candy, chewing gum or throat lozenges.    FOLLOW ANY ADDITIONAL PRE OP INSTRUCTIONS YOU RECEIVED FROM YOUR SURGEON'S OFFICE!!!   Oral Hygiene is also important to reduce your risk of infection.        Remember - BRUSH YOUR TEETH THE MORNING OF SURGERY WITH YOUR REGULAR TOOTHPASTE  Do NOT smoke after Midnight the night before surgery.  STOP TAKING all Vitamins, Herbs and supplements 1 week before your surgery.   Take ONLY these medicines the morning of surgery with A SIP OF WATER: Oxybutynin , omeprazole , finasteride , Atenolol , Duloxetine .  You may take EITHER Tylenol  OR Tramadol  depending on pain level. You may use your Flonase  nasal spray if needed.    If You have been diagnosed with Sleep Apnea - Bring CPAP mask and tubing day of surgery. We will provide you with a CPAP machine on the day of your surgery.                   You may not have any metal on your body including hair pins, jewelry, and body piercing  Do not wear make-up, lotions, powders, perfumes / cologne, or deodorant  Do not wear nail polish including gel and S&S, artificial / acrylic nails, or any other type of covering on natural nails including finger and toenails. If you have artificial nails, gel coating,  etc., that needs to be removed by a nail salon, Please have this removed prior to surgery. Not doing so may mean that your surgery could be cancelled or delayed if the Surgeon or anesthesia staff feels like they are unable to monitor you safely.   Do not shave 48 hours prior to surgery to avoid nicks in your skin which may contribute to postoperative infections.   Men may shave face and neck.  Contacts, Hearing Aids, dentures or bridgework may not be worn into surgery. DENTURES WILL BE REMOVED PRIOR TO SURGERY PLEASE DO NOT APPLY Poly grip OR ADHESIVES!!!   Patients discharged on the day of surgery will not be allowed to drive home.  Someone NEEDS to stay with you for the first 24 hours after anesthesia.  Do not bring your home medications to the hospital. The Pharmacy will dispense medications listed on your medication list to you during your admission in the Hospital.  Special Instructions: Bring a copy of your healthcare power of attorney and living will documents the day of surgery, if you wish to have them scanned into your [facility] Records- EPIC  Please read over the following fact sheets you were given: IF YOU HAVE QUESTIONS ABOUT YOUR PRE-OP INSTRUCTIONS, PLEASE CALL [phone].     Pre-operative 5 CHG Bath Instructions   You can play a key role in reducing the risk of infection after surgery. Your skin needs to be as free of germs as possible. You can reduce the number of germs on your skin by washing with CHG (chlorhexidine  gluconate) soap before surgery. CHG is an antiseptic soap that kills germs and continues to kill germs even after washing.   DO NOT use if you have an allergy to chlorhexidine /CHG or antibacterial soaps. If your skin becomes reddened or irritated, stop using the CHG and notify one of our RNs at [phone]  Please shower with the CHG soap starting 4 days before surgery using the following schedule: START  SHOWERS ON    MONDAY  February 10, 2023                                                                                                                                                                               Please keep in mind the following:  DO NOT shave, including legs and underarms, starting the day of your first shower.   You may shave your face at any point before/day of surgery.   Place clean sheets on your bed the day you start using CHG soap. Use a clean washcloth (not used since being washed) for each shower. DO NOT sleep with pets once you start using the CHG.   CHG Shower Instructions:  If you choose to wash your hair and private area, wash first with your normal shampoo/soap.  After you use shampoo/soap, rinse your hair and body thoroughly to remove shampoo/soap residue.  Turn the water OFF and apply about 3 tablespoons (45 ml) of CHG soap to a CLEAN washcloth.  Apply CHG soap ONLY FROM YOUR NECK DOWN TO YOUR TOES (washing for 3-5 minutes)  DO NOT use CHG soap on face, private areas, open wounds, or sores.  Pay special attention to the area where your surgery is being performed.  If you are having back surgery, having someone wash your back for you may be helpful.  Wait 2 minutes after CHG soap is applied, then you may rinse off the CHG soap.  Pat dry with a clean towel  Put on clean clothes/pajamas   If you choose to wear lotion, please use ONLY the CHG-compatible lotions on the back of this paper.     Additional instructions for the day of surgery: DO NOT APPLY any lotions, deodorants, cologne, or perfumes.   Put on clean/comfortable clothes.  Brush your teeth.  Ask your nurse before applying any prescription medications to the skin.      CHG Compatible Lotions   Aveeno Moisturizing lotion  Cetaphil Moisturizing Cream  Cetaphil Moisturizing Lotion  Clairol Herbal Essence Moisturizing Lotion, Dry Skin  Clairol Herbal Essence Moisturizing Lotion, Extra Dry Skin  Clairol Herbal Essence Moisturizing  Lotion, Normal Skin  Curel Age Defying Therapeutic Moisturizing Lotion with Alpha Hydroxy  Curel Extreme Care Body Lotion  Curel Soothing Hands Moisturizing Hand Lotion  Curel Therapeutic Moisturizing Cream, Fragrance-Free  Curel Therapeutic Moisturizing Lotion, Fragrance-Free  Curel Therapeutic Moisturizing Lotion, Original Formula  Eucerin Daily Replenishing Lotion  Eucerin Dry Skin Therapy Plus Alpha Hydroxy Crme  Eucerin Dry Skin Therapy Plus Alpha Hydroxy Lotion  Eucerin Original Crme  Eucerin Original Lotion  Eucerin Plus Crme Eucerin Plus Lotion  Eucerin TriLipid Replenishing Lotion  Keri Anti-Bacterial Hand Lotion  Keri Deep Conditioning Original Lotion Dry Skin Formula Softly Scented  Keri Deep Conditioning Original Lotion, Fragrance Free Sensitive Skin Formula  Keri Lotion Fast Absorbing Fragrance Free Sensitive Skin Formula  Keri Lotion Fast Absorbing Softly Scented Dry Skin Formula  Keri Original Lotion  Keri Skin Renewal Lotion Keri Silky Smooth Lotion  Keri Silky Smooth Sensitive Skin Lotion  Nivea Body Creamy Conditioning Oil  Nivea Body Extra Enriched Lotion  Nivea Body Original Lotion  Nivea Body Sheer Moisturizing Lotion Nivea Crme  Nivea Skin Firming Lotion  NutraDerm 30 Skin Lotion  NutraDerm Skin Lotion  NutraDerm Therapeutic Skin Cream  NutraDerm Therapeutic Skin Lotion  ProShield Protective Hand Cream  Provon moisturizing lotion     Preparing for Total Shoulder Arthroplasty ================================================================= Please follow these instructions carefully, in addition to any other special Bathing information that was explained to you at the Presurgical Appointment:  BENZOYL PEROXIDE 5% GEL: Used to kill bacteria on the skin which could cause an infection at the surgery site.   Please do not use if you have an allergy to benzoyl peroxide. If your skin becomes reddened/irritated stop using the benzoyl peroxide and inform  your Doctor.   Starting two days before surgery, apply as follows:  1. Apply benzoyl peroxide gel in the morning and at night. Apply after taking a shower. If you are not taking a shower, clean entire shoulder front, back, and side, along with the armpit with a clean wet washcloth.  2. Place a quarter-sized dollop of the gel on your SHOULDER and rub in thoroughly, making sure to cover the front, back, and side of your shoulder, along with the armpit.   2 Days prior to Surgery     TUESDAY  February 11, 2023 First Application _______ Morning Second Application _______ Night  Day Before Surgery         [facility]   February 12, 2023 First Application______ Morning  On the night before surgery, wash your entire body (except hair, face and private areas) with CHG Soap. THEN, rub in the LAST application of the Benzoyl Peroxide Gel on your shoulder.   3. On the Morning of Surgery wash your BODY AGAIN with CHG Soap (except hair, face and private areas)  4. DO NOT USE THE BENZOYL PEROXIDE GEL ON THE DAY OF YOUR SURGERY         FAILURE TO FOLLOW THESE INSTRUCTIONS MAY RESULT IN THE CANCELLATION OF YOUR SURGERY  PATIENT SIGNATURE_________________________________  NURSE SIGNATURE__________________________________  ________________________________________________________________________
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
## 2023-02-13 ENCOUNTER — Other Ambulatory Visit: Payer: Self-pay

## 2023-02-13 ENCOUNTER — Ambulatory Visit (HOSPITAL_COMMUNITY): Payer: Medicare Other | Admitting: Anesthesiology

## 2023-02-13 ENCOUNTER — Encounter (HOSPITAL_COMMUNITY)
Admission: RE | Disposition: A | Discharge status: Skilled Nursing Facility | Payer: Self-pay | Source: Home / Self Care | Attending: Orthopedic Surgery

## 2023-02-13 ENCOUNTER — Encounter (HOSPITAL_COMMUNITY): Payer: Self-pay | Admitting: Orthopedic Surgery

## 2023-02-13 ENCOUNTER — Observation Stay (HOSPITAL_COMMUNITY)
Admission: RE | Admit: 2023-02-13 | Discharge: 2023-02-14 | Disposition: A | Discharge status: Skilled Nursing Facility | Payer: Medicare Other | Attending: Orthopedic Surgery | Admitting: Orthopedic Surgery

## 2023-02-13 DIAGNOSIS — I480 Paroxysmal atrial fibrillation: Secondary | ICD-10-CM | POA: Diagnosis not present

## 2023-02-13 DIAGNOSIS — I129 Hypertensive chronic kidney disease with stage 1 through stage 4 chronic kidney disease, or unspecified chronic kidney disease: Secondary | ICD-10-CM | POA: Insufficient documentation

## 2023-02-13 DIAGNOSIS — Z79899 Other long term (current) drug therapy: Secondary | ICD-10-CM | POA: Insufficient documentation

## 2023-02-13 DIAGNOSIS — Z87891 Personal history of nicotine dependence: Secondary | ICD-10-CM | POA: Insufficient documentation

## 2023-02-13 DIAGNOSIS — M19011 Primary osteoarthritis, right shoulder: Secondary | ICD-10-CM

## 2023-02-13 DIAGNOSIS — Z96611 Presence of right artificial shoulder joint: Secondary | ICD-10-CM

## 2023-02-13 DIAGNOSIS — Z950 Presence of cardiac pacemaker: Secondary | ICD-10-CM | POA: Diagnosis not present

## 2023-02-13 DIAGNOSIS — F0390 Unspecified dementia without behavioral disturbance: Secondary | ICD-10-CM | POA: Diagnosis not present

## 2023-02-13 DIAGNOSIS — M75101 Unspecified rotator cuff tear or rupture of right shoulder, not specified as traumatic: Secondary | ICD-10-CM | POA: Insufficient documentation

## 2023-02-13 DIAGNOSIS — G4730 Sleep apnea, unspecified: Secondary | ICD-10-CM

## 2023-02-13 DIAGNOSIS — N189 Chronic kidney disease, unspecified: Secondary | ICD-10-CM | POA: Insufficient documentation

## 2023-02-13 DIAGNOSIS — I10 Essential (primary) hypertension: Secondary | ICD-10-CM

## 2023-02-13 DIAGNOSIS — I2510 Atherosclerotic heart disease of native coronary artery without angina pectoris: Secondary | ICD-10-CM | POA: Diagnosis not present

## 2023-02-13 DIAGNOSIS — Z01818 Encounter for other preprocedural examination: Principal | ICD-10-CM

## 2023-02-13 DIAGNOSIS — Z955 Presence of coronary angioplasty implant and graft: Secondary | ICD-10-CM | POA: Diagnosis not present

## 2023-02-13 HISTORY — PX: REVERSE SHOULDER ARTHROPLASTY: SHX5054

## 2023-02-13 SURGERY — ARTHROPLASTY, SHOULDER, TOTAL, REVERSE
Anesthesia: Regional | Site: Shoulder | Laterality: Right

## 2023-02-13 MED ORDER — GABAPENTIN 400 MG PO CAPS
800.0000 mg | ORAL_CAPSULE | Freq: Every day | ORAL | Status: DC
Start: 2023-02-13 — End: 2023-02-14
  Administered 2023-02-13: 800 mg via ORAL
  Filled 2023-02-13: qty 2

## 2023-02-13 MED ORDER — BUPIVACAINE LIPOSOME 1.3 % IJ SUSP
INTRAMUSCULAR | Status: DC | PRN
  Administered 2023-02-13: 10 mL via PERINEURAL

## 2023-02-13 MED ORDER — DIAZEPAM 2 MG PO TABS: 1.0000 mg | ORAL_TABLET | Freq: Two times a day (BID) | ORAL | Status: DC | PRN

## 2023-02-13 MED ORDER — PANTOPRAZOLE SODIUM 40 MG PO TBEC
40.0000 mg | DELAYED_RELEASE_TABLET | Freq: Every day | ORAL | Status: DC
Start: 2023-02-14 — End: 2023-02-14
  Administered 2023-02-14: 40 mg via ORAL
  Filled 2023-02-13: qty 1

## 2023-02-13 MED ORDER — METHOCARBAMOL 500 MG PO TABS
500.0000 mg | ORAL_TABLET | Freq: Four times a day (QID) | ORAL | Status: DC | PRN
  Administered 2023-02-13: 500 mg via ORAL
  Filled 2023-02-13: qty 1

## 2023-02-13 MED ORDER — PROPOFOL 10 MG/ML IV BOLUS
INTRAVENOUS | Status: AC
  Filled 2023-02-13: qty 20

## 2023-02-13 MED ORDER — TAMSULOSIN HCL 0.4 MG PO CAPS
0.4000 mg | ORAL_CAPSULE | Freq: Every day | ORAL | Status: DC
  Administered 2023-02-13: 0.4 mg via ORAL
  Filled 2023-02-13: qty 1

## 2023-02-13 MED ORDER — ROCURONIUM BROMIDE 100 MG/10ML IV SOLN
INTRAVENOUS | Status: DC | PRN
  Administered 2023-02-13: 60 mg via INTRAVENOUS

## 2023-02-13 MED ORDER — PHENOL 1.4 % MT LIQD: 1.0000 | OROMUCOSAL | Status: DC | PRN

## 2023-02-13 MED ORDER — LACTATED RINGERS IV SOLN: INTRAVENOUS | Status: DC | PRN

## 2023-02-13 MED ORDER — BISACODYL 5 MG PO TBEC: 5.0000 mg | DELAYED_RELEASE_TABLET | Freq: Every day | ORAL | Status: DC | PRN

## 2023-02-13 MED ORDER — LIDOCAINE HCL (CARDIAC) PF 100 MG/5ML IV SOSY
PREFILLED_SYRINGE | INTRAVENOUS | Status: DC | PRN
  Administered 2023-02-13: 80 mg via INTRAVENOUS

## 2023-02-13 MED ORDER — DIPHENHYDRAMINE HCL 12.5 MG/5ML PO ELIX: 12.5000 mg | ORAL_SOLUTION | ORAL | Status: DC | PRN

## 2023-02-13 MED ORDER — SENNOSIDES-DOCUSATE SODIUM 8.6-50 MG PO TABS
1.0000 | ORAL_TABLET | Freq: Every day | ORAL | Status: DC
  Administered 2023-02-13: 1 via ORAL
  Filled 2023-02-13: qty 1

## 2023-02-13 MED ORDER — DOCUSATE SODIUM 100 MG PO CAPS
100.0000 mg | ORAL_CAPSULE | Freq: Two times a day (BID) | ORAL | Status: DC
  Administered 2023-02-13: 100 mg via ORAL
  Filled 2023-02-13 (×2): qty 1

## 2023-02-13 MED ORDER — HYDROMORPHONE HCL 1 MG/ML IJ SOLN: 0.5000 mg | INTRAMUSCULAR | Status: DC | PRN

## 2023-02-13 MED ORDER — ACETAMINOPHEN 10 MG/ML IV SOLN: 1000.0000 mg | Freq: Once | INTRAVENOUS | Status: DC | PRN

## 2023-02-13 MED ORDER — ONDANSETRON HCL 4 MG PO TABS: 4.0000 mg | ORAL_TABLET | Freq: Four times a day (QID) | ORAL | Status: DC | PRN

## 2023-02-13 MED ORDER — ASPIRIN 81 MG PO TBEC
81.0000 mg | DELAYED_RELEASE_TABLET | Freq: Every day | ORAL | Status: DC
  Administered 2023-02-14: 81 mg via ORAL
  Filled 2023-02-13: qty 1

## 2023-02-13 MED ORDER — PHENYLEPHRINE HCL (PRESSORS) 10 MG/ML IV SOLN
INTRAVENOUS | Status: DC | PRN
  Administered 2023-02-13 (×5): 80 ug via INTRAVENOUS

## 2023-02-13 MED ORDER — FENTANYL CITRATE PF 50 MCG/ML IJ SOSY: 25.0000 ug | PREFILLED_SYRINGE | INTRAMUSCULAR | Status: DC | PRN

## 2023-02-13 MED ORDER — POLYETHYLENE GLYCOL 3350 17 G PO PACK: 17.0000 g | PACK | Freq: Every day | ORAL | Status: DC | PRN

## 2023-02-13 MED ORDER — FLEET ENEMA RE ENEM: 1.0000 | ENEMA | Freq: Once | RECTAL | Status: DC | PRN

## 2023-02-13 MED ORDER — TRANEXAMIC ACID-NACL 1000-0.7 MG/100ML-% IV SOLN
1000.0000 mg | INTRAVENOUS | Status: AC
  Administered 2023-02-13: 1000 mg via INTRAVENOUS
  Filled 2023-02-13: qty 100

## 2023-02-13 MED ORDER — SODIUM CHLORIDE 0.9 % IV SOLN: INTRAVENOUS | Status: DC

## 2023-02-13 MED ORDER — ONDANSETRON HCL 4 MG/2ML IJ SOLN: 4.0000 mg | Freq: Four times a day (QID) | INTRAMUSCULAR | Status: DC | PRN

## 2023-02-13 MED ORDER — CHLORHEXIDINE GLUCONATE 0.12 % MT SOLN
15.0000 mL | Freq: Once | OROMUCOSAL | Status: AC
  Administered 2023-02-13: 15 mL via OROMUCOSAL

## 2023-02-13 MED ORDER — BOOST PO LIQD
237.0000 mL | Freq: Two times a day (BID) | ORAL | Status: DC
Start: 2023-02-13 — End: 2023-02-14
  Administered 2023-02-13 – 2023-02-14 (×2): 237 mL via ORAL
  Filled 2023-02-13 (×2): qty 237

## 2023-02-13 MED ORDER — FENTANYL CITRATE (PF) 100 MCG/2ML IJ SOLN
INTRAMUSCULAR | Status: DC | PRN
  Administered 2023-02-13 (×2): 25 ug via INTRAVENOUS

## 2023-02-13 MED ORDER — 0.9 % SODIUM CHLORIDE (POUR BTL) OPTIME
TOPICAL | Status: DC | PRN
  Administered 2023-02-13: 1000 mL

## 2023-02-13 MED ORDER — ACETAMINOPHEN 500 MG PO TABS
1000.0000 mg | ORAL_TABLET | Freq: Four times a day (QID) | ORAL | Status: AC
  Administered 2023-02-13 – 2023-02-14 (×4): 1000 mg via ORAL
  Filled 2023-02-13 (×4): qty 2

## 2023-02-13 MED ORDER — FENTANYL CITRATE (PF) 100 MCG/2ML IJ SOLN
INTRAMUSCULAR | Status: AC
  Filled 2023-02-13: qty 2

## 2023-02-13 MED ORDER — ONDANSETRON HCL 4 MG/2ML IJ SOLN
INTRAMUSCULAR | Status: DC | PRN
  Administered 2023-02-13: 4 mg via INTRAVENOUS

## 2023-02-13 MED ORDER — TRAZODONE HCL 100 MG PO TABS
200.0000 mg | ORAL_TABLET | Freq: Every day | ORAL | Status: DC
Start: 2023-02-13 — End: 2023-02-14
  Administered 2023-02-13: 200 mg via ORAL
  Filled 2023-02-13: qty 2

## 2023-02-13 MED ORDER — OXYCODONE HCL 5 MG PO TABS: 10.0000 mg | ORAL_TABLET | ORAL | Status: DC | PRN

## 2023-02-13 MED ORDER — ZOLPIDEM TARTRATE 5 MG PO TABS
5.0000 mg | ORAL_TABLET | Freq: Every day | ORAL | Status: DC
  Administered 2023-02-13: 5 mg via ORAL
  Filled 2023-02-13: qty 1

## 2023-02-13 MED ORDER — ATENOLOL 50 MG PO TABS
100.0000 mg | ORAL_TABLET | Freq: Every day | ORAL | Status: DC
  Filled 2023-02-13: qty 2

## 2023-02-13 MED ORDER — METHOCARBAMOL 1000 MG/10ML IJ SOLN: 500.0000 mg | Freq: Four times a day (QID) | INTRAMUSCULAR | Status: DC | PRN

## 2023-02-13 MED ORDER — CEFAZOLIN SODIUM-DEXTROSE 2-4 GM/100ML-% IV SOLN
2.0000 g | INTRAVENOUS | Status: AC
  Administered 2023-02-13: 2 g via INTRAVENOUS
  Filled 2023-02-13: qty 100

## 2023-02-13 MED ORDER — ACETAMINOPHEN 325 MG PO TABS: 325.0000 mg | ORAL_TABLET | Freq: Four times a day (QID) | ORAL | Status: DC | PRN

## 2023-02-13 MED ORDER — SUGAMMADEX SODIUM 200 MG/2ML IV SOLN
INTRAVENOUS | Status: DC | PRN
Start: 2023-02-13 — End: 2023-02-13
  Administered 2023-02-13: 200 mg via INTRAVENOUS

## 2023-02-13 MED ORDER — QUETIAPINE FUMARATE 25 MG PO TABS
12.5000 mg | ORAL_TABLET | Freq: Every day | ORAL | Status: DC
Start: 2023-02-13 — End: 2023-02-14
  Administered 2023-02-13: 12.5 mg via ORAL
  Filled 2023-02-13: qty 1

## 2023-02-13 MED ORDER — FINASTERIDE 5 MG PO TABS
5.0000 mg | ORAL_TABLET | Freq: Every day | ORAL | Status: DC
Start: 2023-02-14 — End: 2023-02-14
  Administered 2023-02-14: 5 mg via ORAL
  Filled 2023-02-13: qty 1

## 2023-02-13 MED ORDER — FLUTICASONE PROPIONATE 50 MCG/ACT NA SUSP: 2.0000 | Freq: Every day | NASAL | Status: DC | PRN

## 2023-02-13 MED ORDER — FENTANYL CITRATE PF 50 MCG/ML IJ SOSY
50.0000 ug | PREFILLED_SYRINGE | INTRAMUSCULAR | Status: DC | PRN
  Administered 2023-02-13: 50 ug via INTRAVENOUS
  Filled 2023-02-13: qty 2

## 2023-02-13 MED ORDER — SODIUM CHLORIDE 0.9 % IR SOLN
Status: DC | PRN
  Administered 2023-02-13: 1000 mL

## 2023-02-13 MED ORDER — LACTATED RINGERS IV SOLN: INTRAVENOUS | Status: DC

## 2023-02-13 MED ORDER — PHENYLEPHRINE HCL-NACL 20-0.9 MG/250ML-% IV SOLN
INTRAVENOUS | Status: DC | PRN
  Administered 2023-02-13: 25 ug/min via INTRAVENOUS

## 2023-02-13 MED ORDER — OXYCODONE HCL 5 MG PO TABS
5.0000 mg | ORAL_TABLET | ORAL | Status: DC | PRN
  Administered 2023-02-13 – 2023-02-14 (×2): 5 mg via ORAL
  Filled 2023-02-13 (×2): qty 1

## 2023-02-13 MED ORDER — OXYBUTYNIN CHLORIDE ER 5 MG PO TB24
10.0000 mg | ORAL_TABLET | Freq: Every day | ORAL | Status: DC
Start: 2023-02-14 — End: 2023-02-14
  Administered 2023-02-14: 10 mg via ORAL
  Filled 2023-02-13: qty 2

## 2023-02-13 MED ORDER — MENTHOL 3 MG MT LOZG
1.0000 | LOZENGE | OROMUCOSAL | Status: DC | PRN
Start: 2023-02-13 — End: 2023-02-14

## 2023-02-13 MED ORDER — DULOXETINE HCL 20 MG PO CPEP
40.0000 mg | ORAL_CAPSULE | Freq: Every day | ORAL | Status: DC
Start: 2023-02-14 — End: 2023-02-14
  Administered 2023-02-14: 40 mg via ORAL
  Filled 2023-02-13: qty 2

## 2023-02-13 MED ORDER — ALUM & MAG HYDROXIDE-SIMETH 200-200-20 MG/5ML PO SUSP: 30.0000 mL | ORAL | Status: DC | PRN

## 2023-02-13 MED ORDER — ORAL CARE MOUTH RINSE: 15.0000 mL | Freq: Once | OROMUCOSAL | Status: AC

## 2023-02-13 MED ORDER — BUPIVACAINE HCL (PF) 0.5 % IJ SOLN
INTRAMUSCULAR | Status: DC | PRN
  Administered 2023-02-13: 10 mL via PERINEURAL

## 2023-02-13 MED ORDER — ATORVASTATIN CALCIUM 40 MG PO TABS
40.0000 mg | ORAL_TABLET | Freq: Every evening | ORAL | Status: DC
Start: 2023-02-13 — End: 2023-02-14
  Administered 2023-02-13: 40 mg via ORAL
  Filled 2023-02-13: qty 1

## 2023-02-13 SURGICAL SUPPLY — 69 items
BAG COUNTER SPONGE SURGICOUNT (BAG) IMPLANT
BAG ZIPLOCK 12X15 (MISCELLANEOUS) ×1 IMPLANT
BASEPLATE P2 COATD GLND 6.5X30 (Shoulder) IMPLANT
BIT DRILL 1.6MX128 (BIT) IMPLANT
BIT DRILL 2.5 DIA 127 CALI (BIT) IMPLANT
BIT DRILL 4 DIA CALIBRATED (BIT) IMPLANT
BLADE SAW SGTL 73X25 THK (BLADE) ×1 IMPLANT
BOOTIES KNEE HIGH SLOAN (MISCELLANEOUS) ×2 IMPLANT
COOLER ICEMAN CLASSIC (MISCELLANEOUS) ×1 IMPLANT
COVER BACK TABLE 60X90IN (DRAPES) ×1 IMPLANT
COVER SURGICAL LIGHT HANDLE (MISCELLANEOUS) ×1 IMPLANT
DRAPE INCISE IOBAN 66X45 STRL (DRAPES) ×1 IMPLANT
DRAPE POUCH INSTRU U-SHP 10X18 (DRAPES) ×1 IMPLANT
DRAPE SHEET LG 3/4 BI-LAMINATE (DRAPES) ×1 IMPLANT
DRAPE SURG 17X11 SM STRL (DRAPES) ×1 IMPLANT
DRAPE SURG ORHT 6 SPLT 77X108 (DRAPES) ×2 IMPLANT
DRAPE TOP 10253 STERILE (DRAPES) ×1 IMPLANT
DRAPE U-SHAPE 47X51 STRL (DRAPES) ×1 IMPLANT
DRSG AQUACEL AG ADV 3.5X 6 (GAUZE/BANDAGES/DRESSINGS) ×1 IMPLANT
DURAPREP 26ML APPLICATOR (WOUND CARE) ×2 IMPLANT
ELECT BLADE TIP CTD 4 INCH (ELECTRODE) ×1 IMPLANT
ELECT REM PT RETURN 15FT ADLT (MISCELLANEOUS) ×1 IMPLANT
FACESHIELD WRAPAROUND (MASK) ×1 IMPLANT
FACESHIELD WRAPAROUND OR TEAM (MASK) ×1 IMPLANT
GLOVE BIO SURGEON STRL SZ7.5 (GLOVE) ×1 IMPLANT
GLOVE BIOGEL PI IND STRL 6.5 (GLOVE) ×1 IMPLANT
GLOVE BIOGEL PI IND STRL 8 (GLOVE) ×1 IMPLANT
GLOVE SURG SS PI 6.5 STRL IVOR (GLOVE) ×1 IMPLANT
GOWN STRL REUS W/ TWL LRG LVL3 (GOWN DISPOSABLE) ×1 IMPLANT
GOWN STRL REUS W/ TWL XL LVL3 (GOWN DISPOSABLE) ×1 IMPLANT
HEAD GLENOID W/SCREW 32MM (Shoulder) IMPLANT
HOOD PEEL AWAY T7 (MISCELLANEOUS) ×3 IMPLANT
INSERT EPOLY STND HUMERUS 36MM (Shoulder) ×1 IMPLANT
INSERT EPOLYSTD HUMERUS 36MM (Shoulder) IMPLANT
KIT BASIN OR (CUSTOM PROCEDURE TRAY) ×1 IMPLANT
KIT TURNOVER KIT A (KITS) IMPLANT
MANIFOLD NEPTUNE II (INSTRUMENTS) ×1 IMPLANT
NDL TROCAR POINT SZ 2 1/2 (NEEDLE) IMPLANT
NEEDLE TROCAR POINT SZ 2 1/2 (NEEDLE) IMPLANT
NS IRRIG 1000ML POUR BTL (IV SOLUTION) ×1 IMPLANT
P2 COATDE GLNOID BSEPLT 6.5X30 (Shoulder) ×1 IMPLANT
PACK SHOULDER (CUSTOM PROCEDURE TRAY) ×1 IMPLANT
PAD COLD SHLDR WRAP-ON (PAD) ×1 IMPLANT
PROTECTOR NERVE ULNAR (MISCELLANEOUS) IMPLANT
RESTRAINT HEAD UNIVERSAL NS (MISCELLANEOUS) ×1 IMPLANT
RETRIEVER SUT HEWSON (MISCELLANEOUS) IMPLANT
SCREW BONE LOCKING RSP 5.0X14 (Screw) ×1 IMPLANT
SCREW BONE LOCKING RSP 5.0X30 (Screw) ×2 IMPLANT
SCREW BONE RSP LOCK 5X14 (Screw) IMPLANT
SCREW BONE RSP LOCK 5X18 (Screw) IMPLANT
SCREW BONE RSP LOCK 5X30 (Screw) IMPLANT
SCREW BONE RSP LOCKING 18MM LG (Screw) ×1 IMPLANT
SET HNDPC FAN SPRY TIP SCT (DISPOSABLE) ×1 IMPLANT
SLING ARM IMMOBILIZER LRG (SOFTGOODS) IMPLANT
SLING ARM IMMOBILIZER MED (SOFTGOODS) IMPLANT
STEM HUMERAL 12X48 STD SHORT (Shoulder) IMPLANT
STRIP CLOSURE SKIN 1/2X4 (GAUZE/BANDAGES/DRESSINGS) ×1 IMPLANT
SUCTION TUBE FRAZIER 10FR DISP (SUCTIONS) IMPLANT
SUPPORT WRAP ARM LG (MISCELLANEOUS) ×1 IMPLANT
SUT ETHIBOND 2 V 37 (SUTURE) IMPLANT
SUT FIBERWIRE #2 38 REV NDL BL (SUTURE) ×2
SUT MNCRL AB 4-0 PS2 18 (SUTURE) ×1 IMPLANT
SUT VIC AB 2-0 CT1 TAPERPNT 27 (SUTURE) ×2 IMPLANT
SUTURE FIBERWR#2 38 REV NDL BL (SUTURE) IMPLANT
TAP SURG THRD DJ 6.5 (ORTHOPEDIC DISPOSABLE SUPPLIES) IMPLANT
TAPE LABRALWHITE 1.5X36 (TAPE) IMPLANT
TAPE SUT LABRALTAP WHT/BLK (SUTURE) IMPLANT
TOWEL OR 17X26 10 PK STRL BLUE (TOWEL DISPOSABLE) ×1 IMPLANT
WATER STERILE IRR 1000ML POUR (IV SOLUTION) ×1 IMPLANT

## 2023-02-13 NOTE — Anesthesia Procedure Notes (Signed)
 Procedure Name: Intubation Date/Time: 02/13/2023 10:41 AM  Performed by: Dartha Meckel, CRNAPre-anesthesia Checklist: Patient identified, Emergency Drugs available, Suction available and Patient being monitored Patient Re-evaluated:Patient Re-evaluated prior to induction Oxygen  Delivery Method: Circle system utilized Preoxygenation: Pre-oxygenation with 100% oxygen  Induction Type: IV induction Ventilation: Mask ventilation without difficulty Laryngoscope Size: Glidescope and 4 Grade View: Grade I Tube type: Oral Tube size: 7.5 mm Number of attempts: 1 Airway Equipment and Method: Stylet and Oral airway Placement Confirmation: ETT inserted through vocal cords under direct vision, positive ETCO2 and breath sounds checked- equal and bilateral Secured at: 23 cm Tube secured with: Tape Dental Injury: Teeth and Oropharynx as per pre-operative assessment

## 2023-02-13 NOTE — Discharge Instructions (Addendum)
 Discharge Instructions after Reverse Total Shoulder Arthroplasty   A sling has been provided for you. You are to wear this at all times (except for bathing and dressing), until your first post operative visit with Dr. Dozier. Please also wear while sleeping at night. While you bath and dress, let the arm/elbow extend straight down to stretch your elbow. Wiggle your fingers and pump your first while your in the sling to prevent hand swelling. Use ice on the shoulder intermittently over the first 48 hours after surgery. Continue to use ice or and ice machine as needed after 48 hours for pain control/swelling.  Pain medicine has been prescribed for you.  Use your medicine liberally over the first 48 hours, and then you can begin to taper your use. You may take Extra Strength Tylenol  or Tylenol  only in place of the pain pills. DO NOT take ANY nonsteroidal anti-inflammatory pain medications: Advil , Motrin , Ibuprofen , Aleve, Naproxen or Naprosyn.  Take aspirin  81mg  daily for 3 weeks Leave your dressing on until your first follow up visit.  You may shower with the dressing.  Hold your arm as if you still have your sling on while you shower. Simply allow the water to wash over the site and then pat dry. Make sure your axilla (armpit) is completely dry after showering.    Please call [phone] during normal business hours or [phone] after hours for any problems. Including the following:  - excessive redness of the incisions - drainage for more than 4 days - fever of more than 101.5 F  *Please note that pain medications will not be refilled after hours or on weekends.  Dental Antibiotics:  In most cases prophylactic antibiotics for Dental procdeures after total joint surgery are not necessary.  Exceptions are as follows:  1. History of prior total joint infection  2. Severely immunocompromised (Organ Transplant, cancer chemotherapy, Rheumatoid biologic meds such as Humera)  3. Poorly  controlled diabetes (A1C &gt; 8.0, blood glucose over 200)  If you have one of these conditions, contact your surgeon for an antibiotic prescription, prior to your dental procedure.

## 2023-02-13 NOTE — Progress Notes (Signed)
 Medtronic Rep has seen patient

## 2023-02-13 NOTE — Op Note (Signed)
 Procedure(s): REVERSE SHOULDER ARTHROPLASTY Procedure Note  Kevin Wiley male 83 y.o. 02/13/2023  Preoperative diagnosis: Right shoulder end-stage osteoarthritis with associated rotator cuff disease  Postoperative diagnosis: Same  Procedure(s) and Anesthesia Type:    * REVERSE SHOULDER ARTHROPLASTY - General   Indications:  83 y.o. male  With endstage right shoulder arthritis with associated rotator cuff disease. Pain and dysfunction interfered with quality of life and nonoperative treatment with activity modification, NSAIDS and injections failed.     Surgeon: Josefa LELON Herring   Assistants: Jeoffrey Northern PA-C Amber was present and scrubbed throughout the procedure and was essential in positioning, retraction, exposure, and closure)  Anesthesia: General endotracheal anesthesia     Procedure Detail  REVERSE SHOULDER ARTHROPLASTY   Estimated Blood Loss:  less than 100 mL         Drains: none  Blood Given: none          Specimens: none        Complications:  * No complications entered in OR log *         Disposition: PACU - hemodynamically stable.         Condition: stable      OPERATIVE FINDINGS:  A DJO Altivate pressfit reverse total shoulder arthroplasty was placed with a  size 12 stem, a 36 standard glenosphere, and a standard-mm poly insert. The base plate  fixation was excellent.  PROCEDURE: The patient was identified in the preoperative holding area  where I personally marked the operative site after verifying site, side,  and procedure with the patient. An interscalene block given by  the attending anesthesiologist in the holding area and the patient was taken back to the operating room where all extremities were  carefully padded in position after general anesthesia was induced. She  was placed in a beach-chair position and the operative upper extremity was  prepped and draped in a standard sterile fashion. An approximately 10-  cm incision was  made from the tip of the coracoid process to the center  point of the humerus at the level of the axilla. Dissection was carried  down through subcutaneous tissues to the level of the cephalic vein  which was taken laterally with the deltoid. The pectoralis major was  retracted medially. The subdeltoid space was developed and the lateral  edge of the conjoined tendon was identified. The undersurface of  conjoined tendon was palpated and the musculocutaneous nerve was not in  the field. Retractor was placed underneath the conjoined and second  retractor was placed lateral into the deltoid. The circumflex humeral  artery and vessels were identified and clamped and coagulated. The  biceps tendon was tenotomized.  The subscapularis was fairly healthy appearing and was taken down with the underlying capsule.  The  joint was then gently externally rotated while the capsule was released  from the humeral neck around to just beyond the 6 o'clock position. At  this point, the joint was dislocated and the humeral head was presented  into the wound. The excessive osteophyte formation was removed with a  large rongeur.  The cutting guide was used to make the appropriate  head cut and the head was saved for potentially bone grafting.  The glenoid was exposed with the arm in an  abducted extended position. The anterior and posterior labrum were  completely excised and the capsule was released circumferentially to  allow for exposure of the glenoid for preparation. The 2.5 mm drill was  placed using the  guide in 5-10 inferior angulation and the tap was then advanced in the same hole. Small and large reamers were then used. The tap was then removed and the Metaglene was then screwed in with excellent purchase.  The peripheral guide was then used to drilled measured and filled peripheral locking screws. The size 36 standard glenosphere was then impacted on the [facility] taper and the central screw was placed. The  humerus was then again exposed and the diaphyseal reamers were used followed by the metaphyseal reamers. The final broach was left in place in the proximal trial was placed. The joint was reduced and with this implant it was felt that soft tissue tensioning was appropriate with excellent stability and excellent range of motion. Therefore, final humeral stem was placed press-fit.  And then the trial polyethylene inserts were tested again and the above implant was felt to be the most appropriate for final insertion. The joint was reduced taken through full range of motion and felt to be stable. Soft tissue tension was appropriate.  The joint was then copiously irrigated with pulse  lavage and the wound was then closed. The subscapularis was repaired with 2 FiberWire's previously placed through bone tunnels and around the implant.  Skin was closed with 2-0 Vicryl in a deep dermal layer and 4-0  Monocryl for skin closure. Steri-Strips were applied. Sterile  dressings were then applied as well as a sling. The patient was allowed  to awaken from general anesthesia, transferred to stretcher, and taken  to recovery room in stable condition.   POSTOPERATIVE PLAN: The patient will be observed in the hospital postoperatively  for pain control and therapy.

## 2023-02-13 NOTE — Transfer of Care (Signed)
 Immediate Anesthesia Transfer of Care Note  Patient: Kevin Wiley  Procedure(s) Performed: REVERSE SHOULDER ARTHROPLASTY (Right: Shoulder)  Patient Location: PACU  Anesthesia Type:General  Level of Consciousness: awake and alert   Airway & Oxygen  Therapy: Patient Spontanous Breathing and Patient connected to face mask oxygen   Post-op Assessment: Report given to RN and Post -op Vital signs reviewed and stable  Post vital signs: Reviewed and stable  Last Vitals:  Vitals Value Taken Time  BP 131/78 02/13/23 1145  Temp    Pulse 70 02/13/23 1145  Resp 8 02/13/23 1145  SpO2 100 % 02/13/23 1145  Vitals shown include unfiled device data.  Last Pain:  Vitals:   02/13/23 1000  TempSrc:   PainSc: 0-No pain      Patients Stated Pain Goal: 5 (02/13/23 0554)  Complications: No notable events documented.

## 2023-02-13 NOTE — Progress Notes (Signed)
 Local device rep for Medtronic paged 6:24 AM

## 2023-02-13 NOTE — Anesthesia Procedure Notes (Signed)
 Anesthesia Regional Block: Interscalene brachial plexus block   Pre-Anesthetic Checklist: , timeout performed,  Correct Patient, Correct Site, Correct Laterality,  Correct Procedure, Correct Position, site marked,  Risks and benefits discussed,  Surgical consent,  Pre-op evaluation,  At surgeon's request and post-op pain management  Laterality: Right  Prep: Dura Prep       Needles:  Injection technique: Single-shot  Needle Type: Echogenic Stimulator Needle     Needle Length: 5cm  Needle Gauge: 20     Additional Needles:   Procedures:,,,, ultrasound used (permanent image in chart),,    Narrative:  Start time: 02/13/2023 9:47 AM End time: 02/13/2023 9:50 AM Injection made incrementally with aspirations every 5 mL.  Performed by: Personally  Anesthesiologist: Dorethea Cordella SQUIBB, DO  Additional Notes: Patient identified. Risks/Benefits/Options discussed with patient including but not limited to bleeding, infection, nerve damage, failed block, incomplete pain control. Patient expressed understanding and wished to proceed. All questions were answered. Sterile technique was used throughout the entire procedure. Please see nursing notes for vital signs. Aspirated in 5cc intervals with injection for negative confirmation. Patient was given instructions on fall risk and not to get out of bed. All questions and concerns addressed with instructions to call with any issues or inadequate analgesia.

## 2023-02-13 NOTE — Plan of Care (Signed)
   Problem: Coping: Goal: Level of anxiety will decrease Outcome: Progressing   Problem: Pain Management: Goal: General experience of comfort will improve Outcome: Progressing   Problem: Safety: Goal: Ability to remain free from injury will improve Outcome: Progressing
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
## 2023-02-14 ENCOUNTER — Encounter (HOSPITAL_COMMUNITY): Payer: Self-pay | Admitting: Orthopedic Surgery

## 2023-02-14 DIAGNOSIS — M19011 Primary osteoarthritis, right shoulder: Secondary | ICD-10-CM | POA: Diagnosis not present

## 2023-02-14 MED ORDER — OXYCODONE HCL 5 MG PO TABS: 5.0000 mg | ORAL_TABLET | ORAL | 0 refills | Status: AC | PRN

## 2023-02-14 MED ORDER — ASPIRIN 81 MG PO TBEC: 81.0000 mg | DELAYED_RELEASE_TABLET | Freq: Every day | ORAL | Status: AC

## 2023-02-14 MED ORDER — TIZANIDINE HCL 2 MG PO TABS: 2.0000 mg | ORAL_TABLET | Freq: Three times a day (TID) | ORAL | 0 refills | Status: AC | PRN

## 2023-02-14 NOTE — Progress Notes (Signed)
 PATIENT ID: Kevin Wiley  MRN: [id number]  DOB/AGE:  August 15, 1940 / 83 y.o.  1 Day Post-Op Procedure(s) (LRB): REVERSE SHOULDER ARTHROPLASTY (Right)  Subjective: Patient sleeping comfortably in room. Awoke briefly to state that he was ok.   Objective: Vital signs in last 24 hours: Temp:  [97.7 F (36.5 C)-98 F (36.7 C)] 98 F (36.7 C) (01/10 0155) Pulse Rate:  [59-70] 70 (01/10 0644) Resp:  [11-18] 18 (01/10 0644) BP: (91-186)/(51-107) 91/51 (01/10 0644) SpO2:  [93 %-100 %] 95 % (01/10 0644) FiO2 (%):  [21 %] 21 % (01/09 1504)  Intake/Output from previous day: 01/09 0701 - 01/10 0700 In: 1852.3 [P.O.:420; I.V.:1332.3; IV Piggyback:100] Out: 825 [Urine:750; Blood:75]  Physical Exam: Neurologically intact Neurovascular intact Incision: dressing C/D/I Sling in place RUE  Assessment/Plan: 1 Day Post-Op Procedure(s) (LRB): REVERSE SHOULDER ARTHROPLASTY (Right)   Advance diet Up with therapy Discharge to SNF when arrangements can be made Non Weight Bearing (NWB) VTE prophylaxis:  aspirin  81mg  daily  Plan for patient to work with OT today. Hopeful for DC to SNF where he lives, [facility], possible today if arrangements can be made. Follow up in office in 2 weeks. Rx printed on chart. Please reach out if Rx need to be sent electronically. Discharge instructions in chart.    Akane Tessier L. [place], PA-C 02/14/2023, 8:14 AM

## 2023-02-14 NOTE — Evaluation (Signed)
 Occupational Therapy Evaluation Patient Details Name: Kevin Wiley MRN: [id number] DOB: 04/12/1940 Today's Date: 02/14/2023   History of Present Illness 83 y.o. male admitted on 02/13/23 With endstage right shoulder arthritis with associated rotator cuff disease. Pt now s/p RT REVERSE SHOULDER ARTHROPLASTY. PMH: bilateral renal artery stenosis s/p stents in 1999, BPH, coronary artery disease with BMS to LAD, chronic low back pain, chronic disease, hyperlipidemia, hypertension, pacemaker placement, paroxysmal atrial fibrillation, PTSD.   Clinical Impression   Pt is s/p shoulder replacement without functional use of RT dominant upper extremity secondary to effects of surgery and interscalene block and shoulder precautions.  Therapist provided education and instruction to patient and (son over phone as well as [facility]) in regards to exercises, precautions, positioning, donning upper extremity clothing and bathing while maintaining shoulder precautions, ice man and edema management and donning/doffing sling. Patient and son will need reinforcement.    Patient is limited by decreased ROM in RT shoulder with baseline complications of using a rollator at all times for ambulation, low vision and very hard of hearing. Pt's son is too ill to come to hospital. So therefore will need some form of assistance at [facility] and that staff was contacted with all information. Patient would benefit from Home OT and PT to reinforce post-op information and work on mobilization without use of rollator.   Acute OT will follow in case pt's discharge is delayed to continue working on pt post-op training.      If plan is discharge home, recommend the following: A lot of help with bathing/dressing/bathroom;A little help with walking and/or transfers;Supervision due to cognitive status;Assist for transportation;Assistance with cooking/housework    Functional Status Assessment  Patient has had a recent decline in their  functional status and demonstrates the ability to make significant improvements in function in a reasonable and predictable amount of time.  Equipment Recommendations  None recommended by OT    Recommendations for Other Services       Precautions / Restrictions Precautions Precaution Booklet Issued: Yes (comment) Precaution Comments: Pt unable to hear most instructions and unable to see to read post-op handouts for ADLs, sling wear and use of ICE MAN as well as restrictions and precautions. Pt's son is sick and unable to come to hospital for training.      Mobility Bed Mobility               General bed mobility comments: Deferred    Transfers                          Balance                                           ADL either performed or assessed with clinical judgement   ADL Overall ADL's : Needs assistance/impaired                                       General ADL Comments: [facility] and pt's son reached via phone. Explained pt's precautions and that he will need assistance for all ADLs esp due to being right hand dominant. Pt provided with handouts and instructions on following shoulder precautions during mobility and ADLs, his HEP, use of ICE man and wear/positioning of sling.SABRA  Vision Ability to See in Adequate Light: 2 Moderately impaired Patient Visual Report: No change from baseline Additional Comments: Pt unable to see to read his handouts. Pt's ALF and son informed of this and educated over phone on main concepts.     Perception         Praxis         Pertinent Vitals/Pain Pain Assessment Pain Assessment: 0-10 Pain Score: 5  Pain Location: RT shoulder/UE Pain Intervention(s): Limited activity within patient's tolerance, Repositioned, Ice applied, Premedicated before session, Monitored during session     Extremity/Trunk Assessment Upper Extremity Assessment Upper Extremity Assessment: Right  hand dominant;RUE deficits/detail RUE Deficits / Details: Pt able to demonstrate hand open/close, wrist flex/ext, forearm pronation/sup with demonstration and cues. OT demonstrated bicep curls and pt verbalized understanding but did not want to demonstrate back. RUE: Shoulder pain at rest;Unable to fully assess due to immobilization   Lower Extremity Assessment Lower Extremity Assessment: Generalized weakness (Not assessed in depth)       Communication Communication Communication: Hearing impairment;Difficulty following commands/understanding;Difficulty communicating thoughts/reduced clarity of speech Cueing Techniques: Visual cues;Gestural cues;Verbal cues   Cognition Arousal: Alert Behavior During Therapy: Anxious, Flat affect Overall Cognitive Status: History of cognitive impairments - at baseline                                       General Comments       Exercises     Shoulder Instructions Shoulder Instructions Donning/doffing shirt without moving shoulder: Moderate assistance Method for sponge bathing under operated UE: Maximal assistance Donning/doffing sling/immobilizer: Maximal assistance Correct positioning of sling/immobilizer: Maximal assistance ROM for elbow, wrist and digits of operated UE: Supervision/safety Sling wearing schedule (on at all times/off for ADL's): Supervision/safety Proper positioning of operated UE when showering: Minimal assistance Dressing change: Maximal assistance Positioning of UE while sleeping: Moderate assistance    Home Living Family/patient expects to be discharged to:: Assisted living                             Home Equipment: Rollator (4 wheels)   Additional Comments: Pt is not an accurate historian and not able to provide much information except that he is assisted at [facility]      Prior Functioning/Environment Prior Level of Function : Needs assist             Mobility Comments: Rollator  for ambulation. -Explained to [facility] on phone that pt has strict shoulder precautuions and may not use RUE weight on a walker or use RUE to propell WC, and may not move RT shoulder until cleared by surgeon. Explained to staff that pt will require increased assistance with all ADLs and mobility due to restrictions and pt being RT hand dominent. ADLs Comments: Pt reported that staff at [facility] assist with all ADLs. This OT called [facility] and spoke with staff there to give instructions. Explained that pt will return back with handouts explaining all education that was relayed over the phone and pt has been trained but with questionable comprehnesion.        OT Problem List: Decreased strength;Decreased range of motion;Decreased knowledge of precautions;Impaired vision/perception;Pain;Impaired UE functional use      OT Treatment/Interventions:      OT Goals(Current goals can be found in the care plan section) Acute Rehab OT Goals OT  Goal Formulation: Patient unable to participate in goal setting Time For Goal Achievement: 02/28/23 Potential to Achieve Goals: Fair ADL Goals Pt Will Perform Upper Body Bathing: with min assist;with caregiver independent in assisting Pt Will Perform Upper Body Dressing: with min assist;sitting;with caregiver independent in assisting Pt Will Transfer to Toilet: with contact guard assist;bedside commode Pt Will Perform Toileting - Clothing Manipulation and hygiene: with caregiver independent in assisting;with min assist Pt/caregiver will Perform Home Exercise Program: Increased ROM;Right Upper extremity (RT elbow->HAnd per post-op shoulder protocol.)  OT Frequency:      Co-evaluation              AM-PAC OT 6 Clicks Daily Activity     Outcome Measure Help from another person eating meals?: A Little Help from another person taking care of personal grooming?: A Little Help from another person toileting, which includes using toliet, bedpan, or  urinal?: A Lot Help from another person bathing (including washing, rinsing, drying)?: A Lot Help from another person to put on and taking off regular upper body clothing?: A Lot Help from another person to put on and taking off regular lower body clothing?: A Lot 6 Click Score: 14   End of Session Equipment Utilized During Treatment: Other (comment) (RUE Sling) Nurse Communication: Other (comment) (Spoke with son over phone. Plan to call [facility])  Activity Tolerance: Patient tolerated treatment well Patient left: in bed;with bed alarm set;with nursing/sitter in room  OT Visit Diagnosis: Muscle weakness (generalized) (M62.81);Other symptoms and signs involving cognitive function;Pain Pain - Right/Left: Right Pain - part of body: Shoulder;Arm                Time: 1151-1209 OT Time Calculation (min): 18 min Charges:  OT General Charges $OT Visit: 1 Visit OT Evaluation $OT Eval Low Complexity: 1 Low  Zoella Roberti, OT Acute Rehab Services Office: [phone] 02/14/2023   Delon Falter 02/14/2023, 12:53 PM

## 2023-02-14 NOTE — Care Management Obs Status (Signed)
 MEDICARE OBSERVATION STATUS NOTIFICATION   Patient Details  Name: Kevin Wiley MRN: [id number] Date of Birth: 1940/08/29   Medicare Observation Status Notification Given:  Yes    Armanda Heritage, RN 02/14/2023, 2:37 PM

## 2023-02-14 NOTE — TOC Transition Note (Signed)
 Transition of Care [facility]) - Discharge Note   Patient Details  Name: Kevin Wiley MRN: [id number] Date of Birth: 12-Jul-1940  [facility] CM/SW Contact:  NORMAN ASPEN, LCSW Phone Number: 02/14/2023, 2:18 PM   Clinical Narrative:     Pt is medically cleared for dc today and return to [facility].  Have confirmed with Librarian, Academic at [facility] that he is cleared for return and the facility is providing dc transportation.  PT and family aware and agreeable.  No further TOC needs.  Final next level of care: Assisted Living [facility]) Barriers to Discharge: No Barriers Identified   Patient Goals and CMS Choice Patient states their goals for this hospitalization and ongoing recovery are:: return to ALF          Discharge Placement                       Discharge Plan and Services Additional resources added to the After Visit Summary for                  DME Arranged: N/A DME Agency: NA                  Social Drivers of Health (SDOH) Interventions SDOH Screenings   Food Insecurity: No Food Insecurity (02/14/2023)  Housing: Low Risk  (02/14/2023)  Tobacco Use: Medium Risk (02/13/2023)     Readmission Risk Interventions     No data to display

## 2023-02-14 NOTE — Discharge Summary (Signed)
 Patient ID: Kevin Wiley MRN: [id number] DOB/AGE: [birth year] 83 y.o.  Admit date: 02/13/2023 Discharge date: 02/14/2023  Admission Diagnoses:  Principal Problem:   S/P reverse total shoulder arthroplasty, right   Discharge Diagnoses:  Same  Past Medical History:  Diagnosis Date   AAA (abdominal aortic aneurysm) (HCC)    Altered mental status 05/03/2013   Anxiety    Arthritis    up/down my back; right hip (07/04/2015)   Bilateral renal artery stenosis (HCC) 12/04/2012   Status post stents 1999 , patent by ultrasound May 2014    Bleeds easily [facility])    BPH (benign prostatic hyperplasia)    CAD (coronary artery disease)    a. BMS to LAD in 1999. b. stable cath in 2008, nuc in 2013 showing scar..   Chronic kidney disease    has RAS, bilateral stents placed 1999   Chronic lower back pain    Claustrophobia    Constipation    Crohn disease (HCC)    Dementia (HCC)    Depression    Fatigue 12/04/2012   GERD (gastroesophageal reflux disease)    Heart murmur    History of blood transfusion 1967   wounded in [place]   Hypercholesteremia    Hyperlipidemia, mixed 12/04/2012   Hypertension    Neuropathy    Orthostasis    Overactive bladder    Pacemaker    PAD (peripheral artery disease) (HCC)    a. s/p renal artery stenting (in 1999, with minimal restenosis by angio 2008, normal duplex in 2013)   PAF (paroxysmal atrial fibrillation) (HCC)    Paroxysmal atrial tachycardia (HCC)    PTSD (post-traumatic stress disorder)    S/P [place]   S/P epidural steroid injection    get them q 2 months; not working well; L4-5 (07/04/2015)   Second degree AV block 06/10/2013   Sleep apnea    [facility] wanted to put a mask on me; I wouldn't do it; I'm claustrophobic (07/04/2015)   SSS (sick sinus syndrome) (HCC) 06/10/2013   Symptomatic bradycardia    a. s/p Medtronic Enrhythm in 2007 with generator change with a Medtronic Adapta device in May 2015. Of note has h/o ataxia/disorientation in  2015 in 2015 which coincided with pacer reaching ERI.    Surgeries: Procedure(s): REVERSE SHOULDER ARTHROPLASTY on 02/13/2023   Consultants:   Discharged Condition: Improved  Hospital Course: TIAGO HUMPHREY is an 83 y.o. male who was admitted 02/13/2023 for operative treatment ofS/P reverse total shoulder arthroplasty, right. Patient has severe unremitting pain that affects sleep, daily activities, and work/hobbies. After pre-op clearance the patient was taken to the operating room on 02/13/2023 and underwent  Procedure(s): REVERSE SHOULDER ARTHROPLASTY.    Patient was given perioperative antibiotics:  Anti-infectives (From admission, onward)    Start     Dose/Rate Route Frequency Ordered Stop   02/13/23 0600  ceFAZolin  (ANCEF ) IVPB 2g/100 mL premix        2 g 200 mL/hr over 30 Minutes Intravenous On call to O.R. 02/13/23 0531 02/13/23 1038        Patient was given sequential compression devices, early ambulation, and chemoprophylaxis to prevent DVT.  Patient benefited maximally from hospital stay and there were no complications.    Recent vital signs: Patient Vitals for the past 24 hrs:  BP Temp Temp src Pulse Resp SpO2  02/14/23 0929 (!) 94/59 99 F (37.2 C) -- 60 17 94 %  02/14/23 0644 (!) 91/51 -- -- 70 18 95 %  02/14/23 0155 105/66 98 F (36.7 C) Oral 63 18 99 %  02/13/23 2042 (!) 99/58 97.7 F (36.5 C) Oral 60 18 95 %  02/13/23 1711 (!) 148/76 97.8 F (36.6 C) Oral 60 15 97 %  02/13/23 1504 (!) 178/91 97.9 F (36.6 C) Oral 60 18 98 %  02/13/23 1345 (!) 168/89 97.8 F (36.6 C) -- (!) 59 13 97 %  02/13/23 1245 (!) 160/84 -- -- (!) 59 16 97 %  02/13/23 1230 (!) 152/79 -- -- 60 11 96 %  02/13/23 1215 (!) 127/98 -- -- 63 15 96 %       Discharge Medications:   Allergies as of 02/14/2023       Reactions   Amlodipine  Besy-benazepril  Hcl Other (See Comments)   Unknown. Allergy is not listed on MAR    Beef-derived Drug Products    Hytrin [terazosin] Nausea And Vomiting    Reported by [facility] Allergy is not listed on MAR        Medication List     STOP taking these medications    lidocaine  4 %   traMADol  50 MG tablet Commonly known as: ULTRAM        TAKE these medications    acetaminophen  325 MG tablet Commonly known as: TYLENOL  Take 650 mg by mouth every 12 (twelve) hours.   acetaminophen  325 MG tablet Commonly known as: TYLENOL  Take 650 mg by mouth every 12 (twelve) hours as needed for mild pain (pain score 1-3) or fever.   aspirin  EC 81 MG tablet Take 1 tablet (81 mg total) by mouth daily. Swallow whole.   atenolol  100 MG tablet Commonly known as: TENORMIN  Take 1 tablet (100 mg total) by mouth daily.   atorvastatin  40 MG tablet Commonly known as: LIPITOR  Take 40 mg by mouth every evening.   diazepam  2 MG tablet Commonly known as: VALIUM  Take 1 mg by mouth 2 (two) times daily as needed for anxiety.   DULoxetine  20 MG capsule Commonly known as: CYMBALTA  Take 40 mg by mouth daily.   finasteride  5 MG tablet Commonly known as: PROSCAR  Take 5 mg by mouth daily.   fluticasone  50 MCG/ACT nasal spray Commonly known as: FLONASE  Place 2 sprays into both nostrils daily.   gabapentin  800 MG tablet Commonly known as: NEURONTIN  Take 800 mg by mouth at bedtime.   lactose free nutrition Liqd Take 237 mLs by mouth 2 (two) times daily.   loperamide 2 MG capsule Commonly known as: IMODIUM Take 2 mg by mouth every 6 (six) hours as needed for diarrhea or loose stools.   omeprazole  20 MG capsule Commonly known as: PRILOSEC Take 20 mg by mouth 2 (two) times daily before a meal.   oxybutynin  10 MG 24 hr tablet Commonly known as: DITROPAN -XL Take 10 mg by mouth daily.   oxyCODONE  5 MG immediate release tablet Commonly known as: Oxy IR/ROXICODONE  Take 1 tablet (5 mg total) by mouth every 4 (four) hours as needed for moderate pain (pain score 4-6) or severe pain (pain score 7-10) (pain score 4-6).   polyethylene glycol 17 g  packet Commonly known as: MIRALAX  / GLYCOLAX  Take 17 g by mouth daily as needed for moderate constipation.   QUEtiapine  25 MG tablet Commonly known as: SEROQUEL  Take 12.5 mg by mouth at bedtime.   senna-docusate 8.6-50 MG tablet Commonly known as: Senokot-S Take 1 tablet by mouth at bedtime.   tamsulosin  0.4 MG Caps capsule Commonly known as: FLOMAX  Take 1 capsule (0.4 mg  total) by mouth at bedtime.   tiZANidine  2 MG tablet Commonly known as: ZANAFLEX  Take 1 tablet (2 mg total) by mouth every 8 (eight) hours as needed for muscle spasms.   traZODone  100 MG tablet Commonly known as: DESYREL  Take 200 mg by mouth at bedtime.   zolpidem  10 MG tablet Commonly known as: AMBIEN  Take 10 mg by mouth at bedtime.        Diagnostic Studies: VAS US  LOWER EXTREMITY VENOUS (DVT) Result Date: 01/27/2023  Lower Venous DVT Study Patient Name:  DEMETRIUS MAHLER  Date of Exam:   01/27/2023 Medical Rec #: [id number]      Accession #:    [id number] Date of Birth: 07/31/1940       Patient Gender: M Patient Age:   78 years Exam Location:  [facility] Procedure:      VAS US  LOWER EXTREMITY VENOUS (DVT) Referring Phys: CALLIE GOODRICH --------------------------------------------------------------------------------  Other Indications: Patient complains of chronic right calf/foot swelling. He                    denies chest pain and SOB. Comparison Study: None Performing Technologist: Alecia Mackin RVT, RDCS (AE), RDMS  Examination Guidelines: A complete evaluation includes B-mode imaging, spectral Doppler, color Doppler, and power Doppler as needed of all accessible portions of each vessel. Bilateral testing is considered an integral part of a complete examination. Limited examinations for reoccurring indications may be performed as noted. The reflux portion of the exam is performed with the patient in reverse Trendelenburg.  +---------+---------------+---------+-----------+----------+--------------+ RIGHT     CompressibilityPhasicitySpontaneityPropertiesThrombus Aging +---------+---------------+---------+-----------+----------+--------------+ CFV      Full           Yes      Yes                                 +---------+---------------+---------+-----------+----------+--------------+ SFJ      Full           Yes      Yes                                 +---------+---------------+---------+-----------+----------+--------------+ FV Prox  Full           Yes      Yes                                 +---------+---------------+---------+-----------+----------+--------------+ FV Mid   Full           Yes      Yes                                 +---------+---------------+---------+-----------+----------+--------------+ FV DistalFull           Yes      Yes                                 +---------+---------------+---------+-----------+----------+--------------+ PFV      Full                                                        +---------+---------------+---------+-----------+----------+--------------+  POP      Full           Yes      Yes                                 +---------+---------------+---------+-----------+----------+--------------+ PTV      Full           Yes      Yes                                 +---------+---------------+---------+-----------+----------+--------------+ PERO     Full           Yes      Yes                                 +---------+---------------+---------+-----------+----------+--------------+ Gastroc  Full                                                        +---------+---------------+---------+-----------+----------+--------------+ GSV      Full           Yes      Yes                                 +---------+---------------+---------+-----------+----------+--------------+   +----+---------------+---------+-----------+----------+--------------+  LEFTCompressibilityPhasicitySpontaneityPropertiesThrombus Aging +----+---------------+---------+-----------+----------+--------------+ CFV Full           Yes      Yes                                 +----+---------------+---------+-----------+----------+--------------+   Findings reported to [facility] goodrich thru secure chat in EPIC at 10:50 am.  Summary: RIGHT: - No evidence of deep vein thrombosis in the lower extremity. No indirect evidence of obstruction proximal to the inguinal ligament.  - No cystic structure found in the popliteal fossa. - Mild superficial edema noted in the medial calf.  LEFT: - No evidence of common femoral vein obstruction.   *See table(s) above for measurements and observations. Electronically signed by Gaile New MD on 01/27/2023 at 3:23:29 PM.    Final     Disposition: Discharge disposition: 03-Skilled Nursing Facility       Discharge Instructions     Call MD / Call 911   Complete by: As directed    If you experience chest pain or shortness of breath, CALL 911 and be transported to the hospital emergency room.  If you develope a fever above 101 F, pus (white drainage) or increased drainage or redness at the wound, or calf pain, call your surgeon's office.   Constipation Prevention   Complete by: As directed    Drink plenty of fluids.  Prune juice may be helpful.  You may use a stool softener, such as Colace (over the counter) 100 mg twice a day.  Use MiraLax  (over the counter) for constipation as needed.   Discharge instructions   Complete by: As directed    Discharge Instructions after Reverse Total Shoulder Arthroplasty   A sling has been provided for you. You  are to wear this at all times (except for bathing and dressing), until your first post operative visit with Dr. Dozier. Please also wear while sleeping at night. While you bath and dress, let the arm/elbow extend straight down to stretch your elbow. Wiggle your fingers and pump your first  while your in the sling to prevent hand swelling. Use ice on the shoulder intermittently over the first 48 hours after surgery. Continue to use ice or and ice machine as needed after 48 hours for pain control/swelling.  Pain medicine has been prescribed for you.  Use your medicine liberally over the first 48 hours, and then you can begin to taper your use. You may take Extra Strength Tylenol  or Tylenol  only in place of the pain pills. DO NOT take ANY nonsteroidal anti-inflammatory pain medications: Advil , Motrin , Ibuprofen , Aleve, Naproxen or Naprosyn.  Take one aspirin  a day for 2 weeks after surgery, unless you have an aspirin  sensitivity/allergy or asthma.  Leave your dressing on until your first follow up visit.  You may shower with the dressing.  Hold your arm as if you still have your sling on while you shower. Simply allow the water to wash over the site and then pat dry. Make sure your axilla (armpit) is completely dry after showering.    Please call [phone] during normal business hours or [phone] after hours for any problems. Including the following:  - excessive redness of the incisions - drainage for more than 4 days - fever of more than 101.5 F  *Please note that pain medications will not be refilled after hours or on weekends.   Post-operative opioid taper instructions:   Complete by: As directed    POST-OPERATIVE OPIOID TAPER INSTRUCTIONS: It is important to wean off of your opioid medication as soon as possible. If you do not need pain medication after your surgery it is ok to stop day one. Opioids include: Codeine, Hydrocodone (Norco, Vicodin), Oxycodone (Percocet, oxycontin ) and hydromorphone  amongst others.  Long term and even short term use of opiods can cause: Increased pain response Dependence Constipation Depression Respiratory depression And more.  Withdrawal symptoms can include Flu like symptoms Nausea, vomiting And more Techniques to manage these  symptoms Hydrate well Eat regular healthy meals Stay active Use relaxation techniques(deep breathing, meditating, yoga) Do Not substitute Alcohol  to help with tapering If you have been on opioids for less than two weeks and do not have pain than it is ok to stop all together.  Plan to wean off of opioids This plan should start within one week post op of your joint replacement. Maintain the same interval or time between taking each dose and first decrease the dose.  Cut the total daily intake of opioids by one tablet each day Next start to increase the time between doses. The last dose that should be eliminated is the evening dose.           Follow-up Information     Dozier Soulier, MD Follow up on 02/28/2023.   Specialty: Orthopedic Surgery Why: your appointment is scheduled for 10:45 Contact information: [address] [place] [address] [phone]                  Signed: Jeoffrey L. Porterfield, PA-C 02/14/2023, 12:01 PM

## 2023-02-14 NOTE — Anesthesia Postprocedure Evaluation (Signed)
 Anesthesia Post Note  Patient: Kevin Wiley  Procedure(s) Performed: REVERSE SHOULDER ARTHROPLASTY (Right: Shoulder)     Patient location during evaluation: PACU Anesthesia Type: Regional and General Level of consciousness: awake and alert Pain management: pain level controlled Vital Signs Assessment: post-procedure vital signs reviewed and stable Respiratory status: spontaneous breathing, nonlabored ventilation, respiratory function stable and patient connected to nasal cannula oxygen  Cardiovascular status: blood pressure returned to baseline and stable Postop Assessment: no apparent nausea or vomiting Anesthetic complications: no   No notable events documented.  Last Vitals:  Vitals:   02/14/23 0644 02/14/23 0929  BP: (!) 91/51 (!) 94/59  Pulse: 70 60  Resp: 18 17  Temp:  37.2 C  SpO2: 95% 94%    Last Pain:  Vitals:   02/14/23 0516  TempSrc:   PainSc: 4                  Camillo Quadros P Shaena Parkerson

## 2023-07-11 ENCOUNTER — Ambulatory Visit: Payer: Medicare Other | Attending: Cardiovascular Disease | Admitting: Cardiovascular Disease

## 2023-07-14 ENCOUNTER — Encounter: Payer: Self-pay | Admitting: Cardiovascular Disease
